# Patient Record
Sex: Female | Born: 1935
Health system: Southern US, Community
[De-identification: ages and names within clinical notes are randomized; demographics above are authoritative.]

## PROBLEM LIST (undated history)

## (undated) DIAGNOSIS — D649 Anemia, unspecified: Secondary | ICD-10-CM

## (undated) DIAGNOSIS — I10 Essential (primary) hypertension: Secondary | ICD-10-CM

## (undated) DIAGNOSIS — K219 Gastro-esophageal reflux disease without esophagitis: Secondary | ICD-10-CM

## (undated) DIAGNOSIS — M1990 Unspecified osteoarthritis, unspecified site: Secondary | ICD-10-CM

## (undated) DIAGNOSIS — Z8719 Personal history of other diseases of the digestive system: Secondary | ICD-10-CM

## (undated) DIAGNOSIS — C801 Malignant (primary) neoplasm, unspecified: Secondary | ICD-10-CM

## (undated) DIAGNOSIS — I350 Nonrheumatic aortic (valve) stenosis: Secondary | ICD-10-CM

## (undated) DIAGNOSIS — E119 Type 2 diabetes mellitus without complications: Secondary | ICD-10-CM

## (undated) DIAGNOSIS — K589 Irritable bowel syndrome without diarrhea: Secondary | ICD-10-CM

## (undated) DIAGNOSIS — Z952 Presence of prosthetic heart valve: Secondary | ICD-10-CM

## (undated) DIAGNOSIS — E039 Hypothyroidism, unspecified: Secondary | ICD-10-CM

## (undated) DIAGNOSIS — R42 Dizziness and giddiness: Secondary | ICD-10-CM

## (undated) HISTORY — DX: Nonrheumatic aortic (valve) stenosis: I35.0

## (undated) HISTORY — PX: APPENDECTOMY: SHX54

## (undated) HISTORY — PX: JOINT REPLACEMENT: SHX530

## (undated) HISTORY — PX: COLONOSCOPY: SHX174

## (undated) HISTORY — PX: SHOULDER ARTHROSCOPY: SHX128

---

## 1987-01-29 DIAGNOSIS — C801 Malignant (primary) neoplasm, unspecified: Secondary | ICD-10-CM

## 1987-01-29 HISTORY — DX: Malignant (primary) neoplasm, unspecified: C80.1

## 1993-01-28 HISTORY — PX: CHOLECYSTECTOMY: SHX55

## 1997-09-21 ENCOUNTER — Encounter: Admission: RE | Admit: 1997-09-21 | Discharge: 1997-12-20 | Payer: Self-pay | Admitting: Endocrinology

## 1998-01-04 ENCOUNTER — Other Ambulatory Visit: Admission: RE | Admit: 1998-01-04 | Discharge: 1998-01-04 | Payer: Self-pay | Admitting: Obstetrics and Gynecology

## 1998-04-03 ENCOUNTER — Ambulatory Visit (HOSPITAL_COMMUNITY): Admission: RE | Admit: 1998-04-03 | Discharge: 1998-04-03 | Payer: Self-pay | Admitting: *Deleted

## 1999-02-19 ENCOUNTER — Other Ambulatory Visit: Admission: RE | Admit: 1999-02-19 | Discharge: 1999-02-19 | Payer: Self-pay | Admitting: Obstetrics and Gynecology

## 1999-10-29 HISTORY — PX: CARPAL TUNNEL RELEASE: SHX101

## 1999-11-08 ENCOUNTER — Encounter: Payer: Self-pay | Admitting: Endocrinology

## 1999-11-08 ENCOUNTER — Encounter: Admission: RE | Admit: 1999-11-08 | Discharge: 1999-11-08 | Payer: Self-pay | Admitting: Endocrinology

## 1999-11-14 ENCOUNTER — Ambulatory Visit (HOSPITAL_BASED_OUTPATIENT_CLINIC_OR_DEPARTMENT_OTHER): Admission: RE | Admit: 1999-11-14 | Discharge: 1999-11-14 | Payer: Self-pay | Admitting: *Deleted

## 2000-02-28 ENCOUNTER — Other Ambulatory Visit: Admission: RE | Admit: 2000-02-28 | Discharge: 2000-02-28 | Payer: Self-pay | Admitting: Obstetrics and Gynecology

## 2000-10-14 ENCOUNTER — Encounter: Payer: Self-pay | Admitting: Endocrinology

## 2000-10-14 ENCOUNTER — Ambulatory Visit (HOSPITAL_COMMUNITY): Admission: RE | Admit: 2000-10-14 | Discharge: 2000-10-14 | Payer: Self-pay | Admitting: Endocrinology

## 2000-11-11 ENCOUNTER — Encounter: Admission: RE | Admit: 2000-11-11 | Discharge: 2000-11-11 | Payer: Self-pay | Admitting: Obstetrics and Gynecology

## 2000-11-11 ENCOUNTER — Encounter: Payer: Self-pay | Admitting: Obstetrics and Gynecology

## 2001-03-12 ENCOUNTER — Other Ambulatory Visit: Admission: RE | Admit: 2001-03-12 | Discharge: 2001-03-12 | Payer: Self-pay | Admitting: Obstetrics and Gynecology

## 2001-11-13 ENCOUNTER — Encounter: Payer: Self-pay | Admitting: Obstetrics and Gynecology

## 2001-11-13 ENCOUNTER — Encounter: Admission: RE | Admit: 2001-11-13 | Discharge: 2001-11-13 | Payer: Self-pay | Admitting: Obstetrics and Gynecology

## 2002-03-31 ENCOUNTER — Other Ambulatory Visit: Admission: RE | Admit: 2002-03-31 | Discharge: 2002-03-31 | Payer: Self-pay | Admitting: Obstetrics and Gynecology

## 2002-11-16 ENCOUNTER — Encounter: Admission: RE | Admit: 2002-11-16 | Discharge: 2002-11-16 | Payer: Self-pay | Admitting: Obstetrics and Gynecology

## 2002-11-16 ENCOUNTER — Encounter: Payer: Self-pay | Admitting: Obstetrics and Gynecology

## 2003-06-22 ENCOUNTER — Other Ambulatory Visit: Admission: RE | Admit: 2003-06-22 | Discharge: 2003-06-22 | Payer: Self-pay | Admitting: Obstetrics and Gynecology

## 2003-09-07 ENCOUNTER — Ambulatory Visit (HOSPITAL_COMMUNITY): Admission: RE | Admit: 2003-09-07 | Discharge: 2003-09-07 | Payer: Self-pay | Admitting: *Deleted

## 2003-11-08 ENCOUNTER — Ambulatory Visit (HOSPITAL_COMMUNITY): Admission: RE | Admit: 2003-11-08 | Discharge: 2003-11-08 | Payer: Self-pay | Admitting: Orthopedic Surgery

## 2003-11-09 ENCOUNTER — Inpatient Hospital Stay (HOSPITAL_COMMUNITY): Admission: RE | Admit: 2003-11-09 | Discharge: 2003-11-13 | Payer: Self-pay | Admitting: Orthopedic Surgery

## 2003-11-10 ENCOUNTER — Ambulatory Visit: Payer: Self-pay | Admitting: Physical Medicine & Rehabilitation

## 2003-12-15 ENCOUNTER — Encounter: Admission: RE | Admit: 2003-12-15 | Discharge: 2004-01-24 | Payer: Self-pay | Admitting: Orthopedic Surgery

## 2004-01-03 ENCOUNTER — Encounter: Admission: RE | Admit: 2004-01-03 | Discharge: 2004-01-03 | Payer: Self-pay | Admitting: Obstetrics and Gynecology

## 2004-09-11 ENCOUNTER — Encounter: Admission: RE | Admit: 2004-09-11 | Discharge: 2004-09-11 | Payer: Self-pay | Admitting: Orthopedic Surgery

## 2004-10-04 ENCOUNTER — Encounter: Admission: RE | Admit: 2004-10-04 | Discharge: 2004-10-04 | Payer: Self-pay | Admitting: Orthopedic Surgery

## 2004-10-19 ENCOUNTER — Encounter: Admission: RE | Admit: 2004-10-19 | Discharge: 2004-10-19 | Payer: Self-pay | Admitting: Orthopedic Surgery

## 2005-01-30 ENCOUNTER — Encounter: Admission: RE | Admit: 2005-01-30 | Discharge: 2005-01-30 | Payer: Self-pay | Admitting: Obstetrics and Gynecology

## 2006-01-31 ENCOUNTER — Encounter: Admission: RE | Admit: 2006-01-31 | Discharge: 2006-01-31 | Payer: Self-pay | Admitting: Endocrinology

## 2006-05-06 ENCOUNTER — Encounter: Admission: RE | Admit: 2006-05-06 | Discharge: 2006-05-06 | Payer: Self-pay | Admitting: Orthopedic Surgery

## 2006-05-20 ENCOUNTER — Encounter: Admission: RE | Admit: 2006-05-20 | Discharge: 2006-05-20 | Payer: Self-pay | Admitting: Orthopedic Surgery

## 2006-06-04 ENCOUNTER — Encounter: Admission: RE | Admit: 2006-06-04 | Discharge: 2006-06-04 | Payer: Self-pay | Admitting: Orthopedic Surgery

## 2007-01-09 ENCOUNTER — Encounter: Admission: RE | Admit: 2007-01-09 | Discharge: 2007-01-09 | Payer: Self-pay | Admitting: Orthopedic Surgery

## 2007-01-23 ENCOUNTER — Encounter: Admission: RE | Admit: 2007-01-23 | Discharge: 2007-01-23 | Payer: Self-pay | Admitting: Orthopedic Surgery

## 2007-02-06 ENCOUNTER — Encounter: Admission: RE | Admit: 2007-02-06 | Discharge: 2007-02-06 | Payer: Self-pay | Admitting: Obstetrics and Gynecology

## 2007-03-26 ENCOUNTER — Encounter: Admission: RE | Admit: 2007-03-26 | Discharge: 2007-03-26 | Payer: Self-pay | Admitting: Orthopedic Surgery

## 2008-02-08 ENCOUNTER — Encounter: Admission: RE | Admit: 2008-02-08 | Discharge: 2008-02-08 | Payer: Self-pay | Admitting: Endocrinology

## 2009-02-14 ENCOUNTER — Encounter: Admission: RE | Admit: 2009-02-14 | Discharge: 2009-02-14 | Payer: Self-pay | Admitting: Endocrinology

## 2010-02-15 ENCOUNTER — Encounter
Admission: RE | Admit: 2010-02-15 | Discharge: 2010-02-15 | Payer: Self-pay | Source: Home / Self Care | Attending: Obstetrics and Gynecology | Admitting: Obstetrics and Gynecology

## 2010-06-15 NOTE — Op Note (Signed)
Megan [name], Megan [name]                 ACCOUNT NO.:  [id number]   MEDICAL RECORD NO.:  [id number]          PATIENT TYPE:  OUT   LOCATION:  CATS                         FACILITY:  [facility]   PHYSICIAN:  Nadara Mustard, M.D.   DATE OF BIRTH:  September 02, 1934   DATE OF PROCEDURE:  11/09/2003  DATE OF DISCHARGE:                                 OPERATIVE REPORT   PREOPERATIVE DIAGNOSIS:  Osteoarthritis right knee.   POSTOPERATIVE DIAGNOSIS:  Osteoarthritis right knee.   PROCEDURE:  Right total knee arthroplasty with a #7 femur, #7 tibia, 10 mm  poly tray with a #7 patella with Osteonics Scorpio posterior stabilized  implant.   SURGEON:  Nadara Mustard, M.D.   ASSISTANT:  Vanita Panda. Magnus Ivan, M.D.   ANESTHESIA:  General plus femoral block.   ESTIMATED BLOOD LOSS:  Minimal.   ANTIBIOTICS:  Kefzol 1 g.   TOURNIQUET TIME:  60 minutes at 350 mmHg.   DISPOSITION:  To PACU in stable condition.   INDICATIONS FOR PROCEDURE:  The patient is a 76 year old woman with  osteoarthritis of her right knee.  She has failed conservative care  including anti-inflammatories, physical therapy, steroid injections,  arthroscopic debridement without relief.  She has pain with activities of  daily living and presents at this time for a total knee arthroplasty.  The  risks and benefits were discussed including infection, neurovascular injury,  persistent pain, need for additional surgery, DVT, pulmonary embolus.  The  patient states he understands and wishes to proceed at this time.   DESCRIPTION OF PROCEDURE:  The patient was brought to OR room #4 after  undergoing a femoral block.  The patient then underwent general anesthetic.  After adequate level of anesthesia was obtained, the patient's right lower  extremity was prepped using DuraPrep and draped in the sterile field and  Ioban was used to cover all exposed skin.  The knee was flexed and  tourniquet inflated to 350 mmHg.  A midline incision was made and  this was  followed by a medial parapatellar retinacular incision.  Attention was first  focused on the femur.  The guide hole was made.  The intramedullary guide  was placed in the femur for five degrees of valgus.  The 10 mm cutting block  was placed and an additional 2 mm was taken.  The attention was then focused  on the tibia.  External alignment guide was placed. This was allowing the AP  and lateral to be parallel to the shaft of the tibia.  This was set for #4  block and additional 2 mm was taken.  This was again checked with external  alignment and the tibial cut was made.  Attention was then focused on the  femur.  The femoral Chamfer guide was used and this was set for 7 pin and  the Chamfer cuts were made.  The additional posterior stabilized Chamfer  block was then placed.  This was pinned and the posterior stabilized Chamfer  block cuts were then made.  Attention was focused on the patella.  The  patella cuts were  made and this was resurfaced for a #7 patella and the  holes were made for the patella implant. The trial components were then  placed with #7 femur and #7 tibia with a 10 mm poly tray.  The knee was  placed in a full range of motion and the knee was stable.  She had full  extension, full flexion, collateral ligaments were stable.  This was then  marked for rotation.  External alignment was then again checked and the keel  punch guide was then placed and the keep punches were made for a #7 cemented  component.  The knee was then irrigated with pulse lavage, all meniscus and  loose material was removed.  The components were then cemented with the  tibia and femoral components being cemented and placed.  The patella  component was also cemented in place.  The knee was irrigated with pulse  lavage and the tibial tray was placed and the knee was left in extension and  further pulse lavaged until the cement had hardened.  After the cement had  hardened, the knee was again  placed through range of motion.  there was a  slight valgus tracking with the patella and the lateral release was  performed.  Tourniquet was deflated after one hour and hemostasis was  obtained.  The knee was then flexed and the retinacular incision was closed  using #1 Vicryl.  Subcutaneous tissue was closed using 3-0 Vicryl.  Skin was  closed using approximated staples.  The wound was covered with Adaptic  orthopedic sponges, ABD dressing, Webril and a Coban dressing.  The patient  was extubated and taken to the PACU in stable condition.       MVD/MEDQ  D:  11/09/2003  T:  11/09/2003  Job:  [id number]

## 2010-06-15 NOTE — Op Note (Signed)
NAME:  Megan [name], Megan [name]                           ACCOUNT NO.:  [id number]   MEDICAL RECORD NO.:  [id number]                   PATIENT TYPE:  AMB   LOCATION:  ENDO                                 FACILITY:  [facility]   PHYSICIAN:  Georgiana Spinner, M.D.                 DATE OF BIRTH:  1934-07-02   DATE OF PROCEDURE:  09/07/2003  DATE OF DISCHARGE:                                 OPERATIVE REPORT   PROCEDURE:  Upper endoscopy.   INDICATIONS:  GERD.   ANESTHESIA:  Demerol 60 mg, Versed 6 mg.   PROCEDURE:  With the patient mildly sedated in the left lateral decubitus  position, the Olympus videoscopic colonoscope was inserted in the mouth,  passed under direct vision through the esophagus, which appeared normal,  into the stomach.  The fundus, body, antrum, duodenal bulb, second portion  of duodenum appeared normal.  From this point the endoscope was slowly  withdrawn taking circumferential views of the duodenal mucosa until the  endoscope had been pulled back in the stomach, placed in retroflexion to  view the stomach from below.  The endoscope was then straightened and with  drawn taking circumferential views of the remaining gastric and esophageal  mucosa.  The patient's vital signs and pulse oximetry remained stable.  The  patient tolerated the procedure well without apparent complications.   FINDINGS:  Unremarkable examination.   PLAN:  Proceed to colonoscopy.                                               Georgiana Spinner, M.D.    GMO/MEDQ  D:  09/07/2003  T:  09/07/2003  Job:  [id number]

## 2010-06-15 NOTE — Op Note (Signed)
NAME:  Megan [name], Megan [name]                           ACCOUNT NO.:  [id number]   MEDICAL RECORD NO.:  [id number]                   PATIENT TYPE:  AMB   LOCATION:  ENDO                                 FACILITY:  [facility]   PHYSICIAN:  Georgiana Spinner, M.D.                 DATE OF BIRTH:  03-Apr-1934   DATE OF PROCEDURE:  09/07/2003  DATE OF DISCHARGE:                                 OPERATIVE REPORT   PROCEDURE:  Colonoscopy.   INDICATIONS:  Colon cancer screening.   ANESTHESIA:  Demerol 20, Versed 2 mg.   DESCRIPTION OF PROCEDURE:  With patient mildly sedated in the left lateral  decubitus position, the Olympus videoscopic colonoscope was inserted in the  rectum and passed under direct vision with pressure applied to the abdomen  and patient rolled to her back, we reached the cecum, identified by base of  cecum and ileocecal valve, both of which were photographed.  From this  point, the colonoscope was slowly withdrawn, taking circumferential views of  the colonic mucosa, stopping only in the rectum which appeared normal on  direct and showed hemorrhoids on retroflexed view.  The endoscope was  straightened, withdrawn.  The patient's vital signs and pulse oximeter  remained stable.  The patient tolerated the procedure well without apparent  complications.   FINDINGS:  1. Internal hemorrhoids.  2. Occasional diverticulum of sigmoid colon.  3. Otherwise, unremarkable exam.   PLAN:  Consider repeat examination in 5-10 years.                                               Georgiana Spinner, M.D.    GMO/MEDQ  D:  09/07/2003  T:  09/07/2003  Job:  [id number]

## 2010-06-15 NOTE — Op Note (Signed)
[place]  Patient:    Megan [name], Megan [name]                        MRN: [id number] Proc. Date: 11/14/99 Adm. Date:  [id number] Attending:  Kendell Bane                           Operative Report  PREOPERATIVE DIAGNOSIS:  Right carpal tunnel syndrome.  POSTOPERATIVE DIAGNOSIS:  Right carpal tunnel syndrome.  OPERATION PERFORMED:  Decompression median nerve.  SURGEON:  Lowell Bouton, M.D.  ANESTHESIA:  0.5% Marcaine local with sedation.  OPERATIVE FINDINGS:  The patient had obvious pressure on the median nerve wtih thickening of the tenosynovium around the flexor tendons.  There were no masses present in the carpal canal and the motor branch was identified and intact.  DESCRIPTION OF PROCEDURE:  Under 0.5% Marcaine local anesthesia with a tourniquet on the right arm, the right hand was prepped and draped in the usual fashion and after exsanguinating the limb, the tourniquet was inflated to 250 mmHg.  A 2 cm longitudinal incision was made in the palm just ulnar to the thenar crease in the skin creases of the hand.  Sharp dissection was carried through the subcutaneous tissues and bleeding points were coagulated. Blunt dissection was carried through the superficial palmar fascia distal to the transverse carpal ligament.  A hemostat was placed in the carpal canal up against the hook of the hamate and the transverse carpal ligament was divided sharply on the ulnar border of the median nerve.  The nerve was then dissected away from the undersurface of the ligament and the proximal end of the ligament was divided with the scissors.  The carpal canal was then palpated and was found to be adequately decompressed.  The median nerve was examined and the motor branch identified.  The carpal canal was explored and no masses were found.  The wound was irrigated with saline and the skin was closed with 4-0 nylon sutures.  Sterile  dressings were applied followed by a volar wrist splint.  The tourniquet was released with good circulation to the hand.  The patient went to the recovery room awake and stable in good condition. DD:  11/14/99 TD:  11/15/99 Job: [id number]

## 2010-06-15 NOTE — Discharge Summary (Signed)
NAMEHANNY, ELSBERRY                 ACCOUNT NO.:  [id number]   MEDICAL RECORD NO.:  [id number]          PATIENT TYPE:  INP   LOCATION:  5021                         FACILITY:  [facility]   PHYSICIAN:  Nadara Mustard, MD     DATE OF BIRTH:  April 26, 1934   DATE OF ADMISSION:  11/09/2003  DATE OF DISCHARGE:  11/13/2003                                 DISCHARGE SUMMARY   DIAGNOSIS:  Osteoarthritis right knee.   PROCEDURE:  Right total knee arthroplasty.   Discharged to home in stable condition with home health physical therapy,  DME as well as Coumadin for DVT prophylaxis.  Follow up in the office in 2  weeks.   HISTORY OF PRESENT ILLNESS:  The patient is a 76 year old woman with  osteoarthritis of the right knee who has failed conservative care, has pain  with activities of daily living, and presents at this time for right total  knee arthroplasty.   HOSPITAL COURSE:  The patient's hospital course was essentially  unremarkable.  She underwent a right total knee arthroplasty on November 09, 2003 with Osteonics Scorpio components #7 tibia, #7 patella with #7 femur  with a 10 mm poly tray.  Surgeon:  Dr. Lajoyce Corners.  Assistant:  Dr. Magnus Ivan.  The  patient received Kefzol for infection prophylaxis for 24 hours and Coumadin  for DVT prophylaxis for a total of 4 weeks.  The patient postoperatively  progressed well with physical therapy and her IV, PCA, and Foley were  discontinued on October 14th.  The patient was stable with therapy and was  discharged to home in stable condition on October 16th with follow up in the  office in 2 weeks with plan for home health physical therapy.      Vernia Buff   MVD/MEDQ  D:  12/06/2003  T:  12/06/2003  Job:  [id number]

## 2011-03-04 ENCOUNTER — Other Ambulatory Visit: Payer: Self-pay | Admitting: Obstetrics and Gynecology

## 2011-03-04 DIAGNOSIS — Z1231 Encounter for screening mammogram for malignant neoplasm of breast: Secondary | ICD-10-CM

## 2011-03-27 ENCOUNTER — Ambulatory Visit
Admission: RE | Admit: 2011-03-27 | Discharge: 2011-03-27 | Disposition: A | Discharge status: Home / Self Care | Payer: Self-pay | Source: Ambulatory Visit | Attending: Obstetrics and Gynecology | Admitting: Obstetrics and Gynecology

## 2011-03-27 DIAGNOSIS — Z1231 Encounter for screening mammogram for malignant neoplasm of breast: Secondary | ICD-10-CM

## 2012-03-17 ENCOUNTER — Other Ambulatory Visit: Payer: Self-pay | Admitting: Obstetrics and Gynecology

## 2012-03-17 DIAGNOSIS — Z1231 Encounter for screening mammogram for malignant neoplasm of breast: Secondary | ICD-10-CM

## 2012-04-20 ENCOUNTER — Ambulatory Visit
Admission: RE | Admit: 2012-04-20 | Discharge: 2012-04-20 | Disposition: A | Discharge status: Home / Self Care | Payer: Medicare Other | Source: Ambulatory Visit | Attending: Obstetrics and Gynecology | Admitting: Obstetrics and Gynecology

## 2012-04-20 DIAGNOSIS — Z1231 Encounter for screening mammogram for malignant neoplasm of breast: Secondary | ICD-10-CM

## 2013-03-26 ENCOUNTER — Other Ambulatory Visit: Payer: Self-pay

## 2013-03-26 DIAGNOSIS — Z1231 Encounter for screening mammogram for malignant neoplasm of breast: Secondary | ICD-10-CM

## 2013-04-21 ENCOUNTER — Ambulatory Visit
Admission: RE | Admit: 2013-04-21 | Discharge: 2013-04-21 | Disposition: A | Discharge status: Home / Self Care | Payer: Medicare Other | Source: Ambulatory Visit

## 2013-04-21 DIAGNOSIS — Z1231 Encounter for screening mammogram for malignant neoplasm of breast: Secondary | ICD-10-CM

## 2014-03-31 ENCOUNTER — Other Ambulatory Visit: Payer: Self-pay

## 2014-03-31 DIAGNOSIS — Z1231 Encounter for screening mammogram for malignant neoplasm of breast: Secondary | ICD-10-CM

## 2014-04-04 DIAGNOSIS — E118 Type 2 diabetes mellitus with unspecified complications: Secondary | ICD-10-CM | POA: Diagnosis not present

## 2014-04-11 DIAGNOSIS — S42292A Other displaced fracture of upper end of left humerus, initial encounter for closed fracture: Secondary | ICD-10-CM | POA: Diagnosis not present

## 2014-04-11 DIAGNOSIS — M19012 Primary osteoarthritis, left shoulder: Secondary | ICD-10-CM | POA: Diagnosis not present

## 2014-04-11 DIAGNOSIS — M25519 Pain in unspecified shoulder: Secondary | ICD-10-CM | POA: Diagnosis not present

## 2014-04-12 ENCOUNTER — Other Ambulatory Visit: Payer: Self-pay | Admitting: Orthopedic Surgery

## 2014-04-12 ENCOUNTER — Ambulatory Visit
Admission: RE | Admit: 2014-04-12 | Discharge: 2014-04-12 | Disposition: A | Discharge status: Home / Self Care | Payer: Medicare Other | Source: Ambulatory Visit | Attending: Orthopedic Surgery | Admitting: Orthopedic Surgery

## 2014-04-12 DIAGNOSIS — M25512 Pain in left shoulder: Secondary | ICD-10-CM

## 2014-04-12 DIAGNOSIS — S42212A Unspecified displaced fracture of surgical neck of left humerus, initial encounter for closed fracture: Secondary | ICD-10-CM | POA: Diagnosis not present

## 2014-04-26 ENCOUNTER — Ambulatory Visit: Payer: Self-pay

## 2014-05-09 DIAGNOSIS — S42292D Other displaced fracture of upper end of left humerus, subsequent encounter for fracture with routine healing: Secondary | ICD-10-CM | POA: Diagnosis not present

## 2014-06-07 DIAGNOSIS — Z01419 Encounter for gynecological examination (general) (routine) without abnormal findings: Secondary | ICD-10-CM | POA: Diagnosis not present

## 2014-06-29 DIAGNOSIS — S42292S Other displaced fracture of upper end of left humerus, sequela: Secondary | ICD-10-CM | POA: Diagnosis not present

## 2014-06-30 ENCOUNTER — Other Ambulatory Visit (HOSPITAL_COMMUNITY): Payer: Self-pay | Admitting: Orthopedic Surgery

## 2014-07-06 ENCOUNTER — Encounter (HOSPITAL_COMMUNITY)
Admission: RE | Admit: 2014-07-06 | Discharge: 2014-07-06 | Disposition: A | Discharge status: Home / Self Care | Payer: Medicare Other | Source: Ambulatory Visit | Attending: Orthopedic Surgery | Admitting: Orthopedic Surgery

## 2014-07-06 ENCOUNTER — Encounter (HOSPITAL_COMMUNITY): Payer: Self-pay

## 2014-07-06 DIAGNOSIS — Z01812 Encounter for preprocedural laboratory examination: Secondary | ICD-10-CM | POA: Diagnosis not present

## 2014-07-06 DIAGNOSIS — Z0183 Encounter for blood typing: Secondary | ICD-10-CM | POA: Insufficient documentation

## 2014-07-06 DIAGNOSIS — M129 Arthropathy, unspecified: Secondary | ICD-10-CM | POA: Insufficient documentation

## 2014-07-06 DIAGNOSIS — Z01818 Encounter for other preprocedural examination: Secondary | ICD-10-CM

## 2014-07-06 HISTORY — DX: Type 2 diabetes mellitus without complications: E11.9

## 2014-07-06 HISTORY — DX: Essential (primary) hypertension: I10

## 2014-07-06 HISTORY — DX: Malignant (primary) neoplasm, unspecified: C80.1

## 2014-07-06 HISTORY — DX: Unspecified osteoarthritis, unspecified site: M19.90

## 2014-07-06 HISTORY — DX: Personal history of other diseases of the digestive system: Z87.19

## 2014-07-06 LAB — CBC
HEMATOCRIT: 41.3 % (ref 36.0–46.0)
Hemoglobin: 13.9 g/dL (ref 12.0–15.0)
MCH: 29.9 pg (ref 26.0–34.0)
MCHC: 33.7 g/dL (ref 30.0–36.0)
MCV: 88.8 fL (ref 78.0–100.0)
Platelets: 305 10*3/uL (ref 150–400)
RBC: 4.65 MIL/uL (ref 3.87–5.11)
RDW: 13.5 % (ref 11.5–15.5)
WBC: 7.5 10*3/uL (ref 4.0–10.5)

## 2014-07-06 LAB — URINALYSIS, ROUTINE W REFLEX MICROSCOPIC
Bilirubin Urine: NEGATIVE
GLUCOSE, UA: NEGATIVE mg/dL
Hgb urine dipstick: NEGATIVE
Ketones, ur: NEGATIVE mg/dL
Leukocytes, UA: NEGATIVE
Nitrite: NEGATIVE
PROTEIN: NEGATIVE mg/dL
Specific Gravity, Urine: 1.02 (ref 1.005–1.030)
Urobilinogen, UA: 1 mg/dL (ref 0.0–1.0)
pH: 5.5 (ref 5.0–8.0)

## 2014-07-06 LAB — BASIC METABOLIC PANEL
Anion gap: 11 (ref 5–15)
BUN: 17 mg/dL (ref 6–20)
CHLORIDE: 105 mmol/L (ref 101–111)
CO2: 23 mmol/L (ref 22–32)
Calcium: 9.2 mg/dL (ref 8.9–10.3)
Creatinine, Ser: 0.85 mg/dL (ref 0.44–1.00)
Glucose, Bld: 174 mg/dL — ABNORMAL HIGH (ref 65–99)
Potassium: 3.8 mmol/L (ref 3.5–5.1)
SODIUM: 139 mmol/L (ref 135–145)

## 2014-07-06 LAB — TYPE AND SCREEN
ABO/RH(D): B POS
ANTIBODY SCREEN: NEGATIVE

## 2014-07-06 LAB — SURGICAL PCR SCREEN
MRSA, PCR: NEGATIVE
STAPHYLOCOCCUS AUREUS: POSITIVE — AB

## 2014-07-06 LAB — GLUCOSE, CAPILLARY: GLUCOSE-CAPILLARY: 211 mg/dL — AB (ref 65–99)

## 2014-07-06 LAB — ABO/RH: ABO/RH(D): B POS

## 2014-07-06 NOTE — Progress Notes (Signed)
   07/06/14 0912  OBSTRUCTIVE SLEEP APNEA  Have you ever been diagnosed with sleep apnea through a sleep study? No  Do you snore loudly (loud enough to be heard through closed doors)?  0  Do you often feel tired, fatigued, or sleepy during the daytime? 0  Has anyone observed you stop breathing during your sleep? 0  Do you have, or are you being treated for high blood pressure? 1  BMI more than 35 kg/m2? 1  Age over 78 years old? 1  Neck circumference greater than 40 cm/16 inches? 1 (16)  Gender: 0  Obstructive Sleep Apnea Score 4

## 2014-07-06 NOTE — Pre-Procedure Instructions (Addendum)
Megan [name]  07/06/2014      [id number] [place], [place] - [address] [place] [address] Phone: [phone] Fax: [phone]    Your procedure is scheduled on 07/14/14.  Report to [facility] short stay admitting at 530 A.M.  Call this number if you have problems the morning of surgery:  [phone]   Remember:  Do not eat food or drink liquids after midnight.  Take these medicines the morning of surgery with A SIP OF WATER myrbetriq,zantac,synthroid    STOP all herbel meds, nsaids (aleve,naproxen,advil,ibuprofen) 5 days prior to surgery starting 07/09/14 including asa, calcium,fish oil    No glimepride or nesina day of surgery   Do not wear jewelry, make-up or nail polish.  Do not wear lotions, powders, or perfumes.  You may wear deodorant.  Do not shave 48 hours prior to surgery.  Men may shave face and neck.  Do not bring valuables to the hospital.  [facility] is not responsible for any belongings or valuables.  Contacts, dentures or bridgework may not be worn into surgery.  Leave your suitcase in the car.  After surgery it may be brought to your room.  For patients admitted to the hospital, discharge time will be determined by your treatment team.  Patients discharged the day of surgery will not be allowed to drive home.   Name and phone number of your driver:    Special instructions:   Special Instructions: [place] - Preparing for Surgery  Before surgery, you can play an important role.  Because skin is not sterile, your skin needs to be as free of germs as possible.  You can reduce the number of germs on you skin by washing with CHG (chlorahexidine gluconate) soap before surgery.  CHG is an antiseptic cleaner which kills germs and bonds with the skin to continue killing germs even after washing.  Please DO NOT use if you have an allergy to CHG or antibacterial soaps.  If your skin becomes  reddened/irritated stop using the CHG and inform your nurse when you arrive at Short Stay.  Do not shave (including legs and underarms) for at least 48 hours prior to the first CHG shower.  You may shave your face.  Please follow these instructions carefully:   1.  Shower with CHG Soap the night before surgery and the morning of Surgery.  2.  If you choose to wash your hair, wash your hair first as usual with your normal shampoo.  3.  After you shampoo, rinse your hair and body thoroughly to remove the Shampoo.  4.  Use CHG as you would any other liquid soap.  You can apply chg directly  to the skin and wash gently with scrungie or a clean washcloth.  5.  Apply the CHG Soap to your body ONLY FROM THE NECK DOWN.  Do not use on open wounds or open sores.  Avoid contact with your eyes ears, mouth and genitals (private parts).  Wash genitals (private parts)       with your normal soap.  6.  Wash thoroughly, paying special attention to the area where your surgery will be performed.  7.  Thoroughly rinse your body with warm water from the neck down.  8.  DO NOT shower/wash with your normal soap after using and rinsing off the CHG Soap.  9.  Pat yourself dry with a clean towel.  10.  Wear clean pajamas.            11.  Place clean sheets on your bed the night of your first shower and do not sleep with pets.  Day of Surgery  Do not apply any lotions/deodorants the morning of surgery.  Please wear clean clothes to the hospital/surgery center.  Please read over the following fact sheets that you were given. Pain Booklet, Coughing and Deep Breathing, Blood Transfusion Information, MRSA Information and Surgical Site Infection Prevention

## 2014-07-06 NOTE — Progress Notes (Signed)
req'd ekg, cxr from dr Sharene Butters med Faxed apnea screen to dr Wilson Singer

## 2014-07-07 LAB — URINE CULTURE
COLONY COUNT: NO GROWTH
Culture: NO GROWTH

## 2014-07-11 NOTE — Progress Notes (Signed)
Spoke with Lattie Haw at Dr [facility] office and re-requested CXR EKG and OV notes.  She states she will send.

## 2014-07-13 MED ORDER — CEFAZOLIN SODIUM-DEXTROSE 2-3 GM-% IV SOLR
2.0000 g | INTRAVENOUS | Status: AC
  Administered 2014-07-14: 2 g via INTRAVENOUS
  Filled 2014-07-13: qty 50

## 2014-07-13 NOTE — H&P (Signed)
Megan [name] is an 80 y.o. female.   Chief Complaint: Left shoulder pain HPI: Megan [name] is a 80 year old female with left shoulder pain. She has a history of having rotator cuff surgery by Dr. Sharol Given several years ago. She subsequently went on to develop significant Oster arthritis in the glenohumeral joint. She was noted have rotator cuff tearing at the time. Shoulders with on more dysfunctional since that time she had a fall within the last several months in which she sustained a fracture minimally displaced to the anatomic neck. This fractures had delayed healing and is generally accelerated the demise of her left shoulder she reports pain with any activities pain with at rest pain at night and pain which interferes with her general quality of life. Denies any neck pain or numbness and tingling in the hand  Past Medical History  Diagnosis Date  . Hypertension   . Diabetes mellitus without complication   . History of hiatal hernia   . Arthritis   . Cancer     melanoma    Past Surgical History  Procedure Laterality Date  . Cholecystectomy  89  . Appendectomy      80 yrs old  . Joint replacement Right     knee   . Shoulder arthroscopy Left     No family history on file. Social History:  reports that she has never smoked. She does not have any smokeless tobacco history on file. She reports that she does not drink alcohol or use illicit drugs.  Allergies: No Known Allergies  No prescriptions prior to admission    No results found for this or any previous visit (from the past 48 hour(s)). No results found.  Review of Systems  Constitutional: Negative.   HENT: Negative.   Eyes: Negative.   Respiratory: Negative.   Cardiovascular: Negative.   Gastrointestinal: Negative.   Genitourinary: Negative.   Musculoskeletal: Positive for joint pain.  Skin: Negative.   Neurological: Negative.   Endo/Heme/Allergies: Negative.   Psychiatric/Behavioral: Negative.     There were no vitals  taken for this visit. Physical Exam  Constitutional: She is oriented to person, place, and time. She appears well-developed.  HENT:  Head: Normocephalic.  Eyes: Pupils are equal, round, and reactive to light.  Neck: Normal range of motion.  Cardiovascular: Normal rate.   Respiratory: Effort normal.  Neurological: She is alert and oriented to person, place, and time.  Skin: Skin is warm.   examination the left shoulder demonstrates palpable radial pulse external rotation 15 abduction is about 30 she has pretty poor forward flexion abduction both which are less than 45 biceps is functional deltoid does fire his a lot of coarse grinding and crepitus with the passive range of motion of the left shoulder  Assessment/Plan Impression is significant osteoarthritis and fracture of the left shoulder. Plan reverse shoulder replacement. Patient has a little bit of posterior glenoid wear on her CT scan but this I think can be accommodated 4 with reaming of the glenoid. Risk and benefits of this shoulder replacement discussed with the patient we will and to infection or vessel damage instability potential need for more surgery patient extends risk benefits and wished proceed all questions answered  Megan [name] 07/13/2014, 10:34 PM

## 2014-07-14 ENCOUNTER — Encounter (HOSPITAL_COMMUNITY): Payer: Self-pay | Admitting: *Deleted

## 2014-07-14 ENCOUNTER — Inpatient Hospital Stay (HOSPITAL_COMMUNITY): Payer: Medicare Other

## 2014-07-14 ENCOUNTER — Inpatient Hospital Stay (HOSPITAL_COMMUNITY): Payer: Medicare Other | Admitting: Certified Registered Nurse Anesthetist

## 2014-07-14 ENCOUNTER — Encounter (HOSPITAL_COMMUNITY)
Admission: RE | Disposition: A | Discharge status: Home Health | Payer: Self-pay | Source: Ambulatory Visit | Attending: Orthopedic Surgery

## 2014-07-14 ENCOUNTER — Inpatient Hospital Stay (HOSPITAL_COMMUNITY)
Admission: RE | Admit: 2014-07-14 | Discharge: 2014-07-16 | DRG: 483 | Disposition: A | Discharge status: Home Health | Payer: Medicare Other | Source: Ambulatory Visit | Attending: Orthopedic Surgery | Admitting: Orthopedic Surgery

## 2014-07-14 DIAGNOSIS — Z96612 Presence of left artificial shoulder joint: Secondary | ICD-10-CM | POA: Diagnosis not present

## 2014-07-14 DIAGNOSIS — S42212K Unspecified displaced fracture of surgical neck of left humerus, subsequent encounter for fracture with nonunion: Secondary | ICD-10-CM | POA: Diagnosis not present

## 2014-07-14 DIAGNOSIS — Z96651 Presence of right artificial knee joint: Secondary | ICD-10-CM | POA: Diagnosis present

## 2014-07-14 DIAGNOSIS — Z01818 Encounter for other preprocedural examination: Secondary | ICD-10-CM

## 2014-07-14 DIAGNOSIS — E119 Type 2 diabetes mellitus without complications: Secondary | ICD-10-CM | POA: Diagnosis not present

## 2014-07-14 DIAGNOSIS — Z85820 Personal history of malignant melanoma of skin: Secondary | ICD-10-CM

## 2014-07-14 DIAGNOSIS — M19012 Primary osteoarthritis, left shoulder: Secondary | ICD-10-CM | POA: Diagnosis not present

## 2014-07-14 DIAGNOSIS — S4292XK Fracture of left shoulder girdle, part unspecified, subsequent encounter for fracture with nonunion: Secondary | ICD-10-CM | POA: Diagnosis not present

## 2014-07-14 DIAGNOSIS — M25512 Pain in left shoulder: Secondary | ICD-10-CM | POA: Diagnosis present

## 2014-07-14 DIAGNOSIS — I10 Essential (primary) hypertension: Secondary | ICD-10-CM | POA: Diagnosis present

## 2014-07-14 DIAGNOSIS — Z471 Aftercare following joint replacement surgery: Secondary | ICD-10-CM | POA: Diagnosis not present

## 2014-07-14 DIAGNOSIS — S42292K Other displaced fracture of upper end of left humerus, subsequent encounter for fracture with nonunion: Secondary | ICD-10-CM | POA: Diagnosis not present

## 2014-07-14 DIAGNOSIS — G8918 Other acute postprocedural pain: Secondary | ICD-10-CM | POA: Diagnosis not present

## 2014-07-14 HISTORY — PX: REVERSE SHOULDER ARTHROPLASTY: SHX5054

## 2014-07-14 LAB — GLUCOSE, CAPILLARY
Glucose-Capillary: 149 mg/dL — ABNORMAL HIGH (ref 65–99)
Glucose-Capillary: 155 mg/dL — ABNORMAL HIGH (ref 65–99)
Glucose-Capillary: 176 mg/dL — ABNORMAL HIGH (ref 65–99)

## 2014-07-14 SURGERY — ARTHROPLASTY, SHOULDER, TOTAL, REVERSE
Anesthesia: Regional | Site: Shoulder | Laterality: Left

## 2014-07-14 MED ORDER — CELECOXIB 200 MG PO CAPS
200.0000 mg | ORAL_CAPSULE | Freq: Two times a day (BID) | ORAL | Status: DC
  Administered 2014-07-14 – 2014-07-16 (×4): 200 mg via ORAL
  Filled 2014-07-14 (×4): qty 1

## 2014-07-14 MED ORDER — LEVOTHYROXINE SODIUM 125 MCG PO TABS
125.0000 ug | ORAL_TABLET | Freq: Every day | ORAL | Status: DC
  Administered 2014-07-15 – 2014-07-16 (×2): 125 ug via ORAL
  Filled 2014-07-14 (×2): qty 1

## 2014-07-14 MED ORDER — FENTANYL CITRATE (PF) 100 MCG/2ML IJ SOLN
INTRAMUSCULAR | Status: DC | PRN
  Administered 2014-07-14 (×3): 50 ug via INTRAVENOUS
  Administered 2014-07-14 (×2): 25 ug via INTRAVENOUS

## 2014-07-14 MED ORDER — METOCLOPRAMIDE HCL 5 MG/ML IJ SOLN: 5.0000 mg | Freq: Three times a day (TID) | INTRAMUSCULAR | Status: DC | PRN

## 2014-07-14 MED ORDER — ALBUMIN HUMAN 5 % IV SOLN: INTRAVENOUS | Status: DC | PRN

## 2014-07-14 MED ORDER — ONDANSETRON HCL 4 MG/2ML IJ SOLN
INTRAMUSCULAR | Status: AC
  Filled 2014-07-14: qty 2

## 2014-07-14 MED ORDER — CALCIUM CARBONATE 1250 (500 CA) MG PO TABS
1.0000 | ORAL_TABLET | Freq: Every day | ORAL | Status: DC
  Administered 2014-07-15 – 2014-07-16 (×2): 500 mg via ORAL
  Filled 2014-07-14 (×2): qty 1

## 2014-07-14 MED ORDER — SUCCINYLCHOLINE CHLORIDE 20 MG/ML IJ SOLN
INTRAMUSCULAR | Status: AC
  Filled 2014-07-14: qty 1

## 2014-07-14 MED ORDER — ADULT MULTIVITAMIN W/MINERALS CH
1.0000 | ORAL_TABLET | Freq: Every day | ORAL | Status: DC
  Administered 2014-07-15 – 2014-07-16 (×2): 1 via ORAL
  Filled 2014-07-14 (×2): qty 1

## 2014-07-14 MED ORDER — ONDANSETRON HCL 4 MG/2ML IJ SOLN
4.0000 mg | Freq: Four times a day (QID) | INTRAMUSCULAR | Status: DC | PRN
  Administered 2014-07-14: 4 mg via INTRAVENOUS

## 2014-07-14 MED ORDER — OXYCODONE HCL 5 MG PO TABS
5.0000 mg | ORAL_TABLET | ORAL | Status: DC | PRN
Start: 2014-07-14 — End: 2014-07-16
  Administered 2014-07-14 – 2014-07-15 (×7): 10 mg via ORAL
  Filled 2014-07-14 (×7): qty 2

## 2014-07-14 MED ORDER — PHENYLEPHRINE 40 MCG/ML (10ML) SYRINGE FOR IV PUSH (FOR BLOOD PRESSURE SUPPORT)
PREFILLED_SYRINGE | INTRAVENOUS | Status: AC
  Filled 2014-07-14: qty 20

## 2014-07-14 MED ORDER — CHLORHEXIDINE GLUCONATE 4 % EX LIQD: 60.0000 mL | Freq: Once | CUTANEOUS | Status: DC

## 2014-07-14 MED ORDER — ACETAMINOPHEN 325 MG PO TABS: 650.0000 mg | ORAL_TABLET | Freq: Four times a day (QID) | ORAL | Status: DC | PRN

## 2014-07-14 MED ORDER — AMLODIPINE BESYLATE-VALSARTAN 5-160 MG PO TABS: 1.0000 | ORAL_TABLET | Freq: Every day | ORAL | Status: DC

## 2014-07-14 MED ORDER — IRBESARTAN 150 MG PO TABS
150.0000 mg | ORAL_TABLET | Freq: Every day | ORAL | Status: DC
  Administered 2014-07-14 – 2014-07-16 (×3): 150 mg via ORAL
  Filled 2014-07-14 (×3): qty 1

## 2014-07-14 MED ORDER — ASPIRIN EC 325 MG PO TBEC
325.0000 mg | DELAYED_RELEASE_TABLET | Freq: Every day | ORAL | Status: DC
  Administered 2014-07-15 – 2014-07-16 (×2): 325 mg via ORAL
  Filled 2014-07-14 (×3): qty 1

## 2014-07-14 MED ORDER — TRIAMTERENE-HCTZ 37.5-25 MG PO CAPS
1.0000 | ORAL_CAPSULE | Freq: Every day | ORAL | Status: DC | PRN
  Filled 2014-07-14: qty 1

## 2014-07-14 MED ORDER — GLIMEPIRIDE 4 MG PO TABS
2.0000 mg | ORAL_TABLET | Freq: Every day | ORAL | Status: DC
  Administered 2014-07-15 – 2014-07-16 (×2): 2 mg via ORAL
  Filled 2014-07-14 (×2): qty 1

## 2014-07-14 MED ORDER — ONDANSETRON HCL 4 MG PO TABS: 4.0000 mg | ORAL_TABLET | Freq: Four times a day (QID) | ORAL | Status: DC | PRN

## 2014-07-14 MED ORDER — METOCLOPRAMIDE HCL 5 MG PO TABS: 5.0000 mg | ORAL_TABLET | Freq: Three times a day (TID) | ORAL | Status: DC | PRN

## 2014-07-14 MED ORDER — MORPHINE SULFATE 2 MG/ML IJ SOLN
2.0000 mg | INTRAMUSCULAR | Status: DC | PRN
  Administered 2014-07-14: 2 mg via INTRAVENOUS
  Filled 2014-07-14: qty 1

## 2014-07-14 MED ORDER — SUCCINYLCHOLINE CHLORIDE 20 MG/ML IJ SOLN
INTRAMUSCULAR | Status: DC | PRN
  Administered 2014-07-14: 100 mg via INTRAVENOUS

## 2014-07-14 MED ORDER — AMLODIPINE BESYLATE 5 MG PO TABS
5.0000 mg | ORAL_TABLET | Freq: Every day | ORAL | Status: DC
  Administered 2014-07-14 – 2014-07-16 (×3): 5 mg via ORAL
  Filled 2014-07-14 (×3): qty 1

## 2014-07-14 MED ORDER — MIRABEGRON ER 50 MG PO TB24
50.0000 mg | ORAL_TABLET | Freq: Every day | ORAL | Status: DC
  Administered 2014-07-15 – 2014-07-16 (×2): 50 mg via ORAL
  Filled 2014-07-14 (×2): qty 1

## 2014-07-14 MED ORDER — FENTANYL CITRATE (PF) 250 MCG/5ML IJ SOLN
INTRAMUSCULAR | Status: AC
  Filled 2014-07-14: qty 5

## 2014-07-14 MED ORDER — POTASSIUM CHLORIDE IN NACL 20-0.9 MEQ/L-% IV SOLN
INTRAVENOUS | Status: AC
  Administered 2014-07-14 – 2014-07-15 (×2): via INTRAVENOUS
  Filled 2014-07-14 (×2): qty 1000

## 2014-07-14 MED ORDER — ACETAMINOPHEN 650 MG RE SUPP: 650.0000 mg | Freq: Four times a day (QID) | RECTAL | Status: DC | PRN

## 2014-07-14 MED ORDER — ONE-DAILY MULTI VITAMINS PO TABS: 1.0000 | ORAL_TABLET | Freq: Every day | ORAL | Status: DC

## 2014-07-14 MED ORDER — PHENYLEPHRINE HCL 10 MG/ML IJ SOLN
INTRAMUSCULAR | Status: DC | PRN
  Administered 2014-07-14 (×4): 40 ug via INTRAVENOUS
  Administered 2014-07-14: 80 ug via INTRAVENOUS
  Administered 2014-07-14 (×2): 40 ug via INTRAVENOUS
  Administered 2014-07-14: 80 ug via INTRAVENOUS

## 2014-07-14 MED ORDER — ONDANSETRON HCL 4 MG/2ML IJ SOLN
INTRAMUSCULAR | Status: DC | PRN
  Administered 2014-07-14: 4 mg via INTRAVENOUS

## 2014-07-14 MED ORDER — DOCUSATE SODIUM 100 MG PO CAPS
100.0000 mg | ORAL_CAPSULE | Freq: Two times a day (BID) | ORAL | Status: DC
  Administered 2014-07-14 – 2014-07-16 (×5): 100 mg via ORAL
  Filled 2014-07-14 (×5): qty 1

## 2014-07-14 MED ORDER — ROCURONIUM BROMIDE 50 MG/5ML IV SOLN
INTRAVENOUS | Status: AC
  Filled 2014-07-14: qty 1

## 2014-07-14 MED ORDER — ROCURONIUM BROMIDE 100 MG/10ML IV SOLN
INTRAVENOUS | Status: DC | PRN
  Administered 2014-07-14 (×3): 20 mg via INTRAVENOUS
  Administered 2014-07-14: 30 mg via INTRAVENOUS

## 2014-07-14 MED ORDER — PROPOFOL 10 MG/ML IV BOLUS
INTRAVENOUS | Status: AC
  Filled 2014-07-14: qty 20

## 2014-07-14 MED ORDER — PROPOFOL 10 MG/ML IV BOLUS
INTRAVENOUS | Status: DC | PRN
  Administered 2014-07-14: 160 mg via INTRAVENOUS
  Administered 2014-07-14: 20 mg via INTRAVENOUS

## 2014-07-14 MED ORDER — CHOLECALCIFEROL 10 MCG (400 UNIT) PO TABS
400.0000 [IU] | ORAL_TABLET | Freq: Every day | ORAL | Status: DC
  Administered 2014-07-15 – 2014-07-16 (×2): 400 [IU] via ORAL
  Filled 2014-07-14 (×2): qty 1

## 2014-07-14 MED ORDER — CEFAZOLIN SODIUM-DEXTROSE 2-3 GM-% IV SOLR
2.0000 g | Freq: Four times a day (QID) | INTRAVENOUS | Status: AC
Start: 2014-07-14 — End: 2014-07-14
  Administered 2014-07-14 (×2): 2 g via INTRAVENOUS
  Filled 2014-07-14 (×2): qty 50

## 2014-07-14 MED ORDER — GLYCOPYRROLATE 0.2 MG/ML IJ SOLN
INTRAMUSCULAR | Status: AC
  Filled 2014-07-14: qty 3

## 2014-07-14 MED ORDER — LACTATED RINGERS IV SOLN
INTRAVENOUS | Status: DC | PRN
  Administered 2014-07-14 (×3): via INTRAVENOUS

## 2014-07-14 MED ORDER — PHENYLEPHRINE HCL 10 MG/ML IJ SOLN
10.0000 mg | INTRAMUSCULAR | Status: DC | PRN
  Administered 2014-07-14: 10 ug/min via INTRAVENOUS

## 2014-07-14 MED ORDER — KETOTIFEN FUMARATE 0.025 % OP SOLN
1.0000 [drp] | Freq: Every day | OPHTHALMIC | Status: DC
  Administered 2014-07-15 – 2014-07-16 (×2): 1 [drp] via OPHTHALMIC
  Filled 2014-07-14: qty 5

## 2014-07-14 MED ORDER — PHENOL 1.4 % MT LIQD: 1.0000 | OROMUCOSAL | Status: DC | PRN

## 2014-07-14 MED ORDER — HYDROMORPHONE HCL 1 MG/ML IJ SOLN: 0.2500 mg | INTRAMUSCULAR | Status: DC | PRN

## 2014-07-14 MED ORDER — LINAGLIPTIN 5 MG PO TABS
5.0000 mg | ORAL_TABLET | Freq: Every day | ORAL | Status: DC
  Administered 2014-07-15 – 2014-07-16 (×2): 5 mg via ORAL
  Filled 2014-07-14 (×3): qty 1

## 2014-07-14 MED ORDER — SODIUM CHLORIDE 0.9 % IR SOLN
Status: DC | PRN
  Administered 2014-07-14: 1000 mL
  Administered 2014-07-14: 7000 mL
  Administered 2014-07-14: 2000 mL

## 2014-07-14 MED ORDER — ALBUMIN HUMAN 5 % IV SOLN
INTRAVENOUS | Status: DC | PRN
  Administered 2014-07-14: 11:00:00 via INTRAVENOUS

## 2014-07-14 MED ORDER — CHOLECALCIFEROL 10 MCG (400 UNIT) PO TABS: 400.0000 [IU] | ORAL_TABLET | Freq: Every day | ORAL | Status: DC

## 2014-07-14 MED ORDER — LIDOCAINE HCL (CARDIAC) 20 MG/ML IV SOLN
INTRAVENOUS | Status: DC | PRN
  Administered 2014-07-14: 60 mg via INTRAVENOUS

## 2014-07-14 MED ORDER — ALOGLIPTIN BENZOATE 25 MG PO TABS: 1.0000 | ORAL_TABLET | Freq: Every day | ORAL | Status: DC

## 2014-07-14 MED ORDER — NEOSTIGMINE METHYLSULFATE 10 MG/10ML IV SOLN
INTRAVENOUS | Status: AC
  Filled 2014-07-14: qty 1

## 2014-07-14 MED ORDER — GLYCOPYRROLATE 0.2 MG/ML IJ SOLN
INTRAMUSCULAR | Status: DC | PRN
  Administered 2014-07-14: 0.6 mg via INTRAVENOUS

## 2014-07-14 MED ORDER — MENTHOL 3 MG MT LOZG: 1.0000 | LOZENGE | OROMUCOSAL | Status: DC | PRN

## 2014-07-14 MED ORDER — PROMETHAZINE HCL 25 MG/ML IJ SOLN: 6.2500 mg | INTRAMUSCULAR | Status: DC | PRN

## 2014-07-14 MED ORDER — NEOSTIGMINE METHYLSULFATE 10 MG/10ML IV SOLN
INTRAVENOUS | Status: DC | PRN
  Administered 2014-07-14: 5 mg via INTRAVENOUS

## 2014-07-14 MED ORDER — FAMOTIDINE 20 MG PO TABS
10.0000 mg | ORAL_TABLET | Freq: Every day | ORAL | Status: DC
  Administered 2014-07-15 – 2014-07-16 (×2): 10 mg via ORAL
  Filled 2014-07-14 (×3): qty 1

## 2014-07-14 SURGICAL SUPPLY — 69 items
BASEPLATE GLENOID SHLDR SM (Shoulder) ×1 IMPLANT
BLADE SAW SGTL 13X75X1.27 (BLADE) ×2 IMPLANT
BSPLAT GLND SM PRFT SHLDR CA (Shoulder) ×1 IMPLANT
BUR MATCHSTICK NEURO 3.0 LAGG (BURR) ×2 IMPLANT
CEMENT HV SMART SET (Cement) ×1 IMPLANT
CLSR STERI-STRIP ANTIMIC 1/2X4 (GAUZE/BANDAGES/DRESSINGS) ×1 IMPLANT
COVER SURGICAL LIGHT HANDLE (MISCELLANEOUS) ×2 IMPLANT
COVER TABLE BACK 60X90 (DRAPES) IMPLANT
DRAPE C-ARM 42X72 X-RAY (DRAPES) IMPLANT
DRAPE IMP U-DRAPE 54X76 (DRAPES) IMPLANT
DRAPE INCISE IOBAN 66X45 STRL (DRAPES) ×4 IMPLANT
DRAPE U-SHAPE 47X51 STRL (DRAPES) ×4 IMPLANT
DRSG AQUACEL AG ADV 3.5X10 (GAUZE/BANDAGES/DRESSINGS) ×2 IMPLANT
DURAPREP 26ML APPLICATOR (WOUND CARE) ×2 IMPLANT
ELECT BLADE 6.5 EXT (BLADE) IMPLANT
ELECT CAUTERY BLADE 6.4 (BLADE) ×1 IMPLANT
ELECT REM PT RETURN 9FT ADLT (ELECTROSURGICAL) ×2
ELECTRODE REM PT RTRN 9FT ADLT (ELECTROSURGICAL) ×1 IMPLANT
GLENOSPHERE LATERAL 36MM+4 (Shoulder) ×1 IMPLANT
GLOVE BIOGEL PI IND STRL 7.5 (GLOVE) ×1 IMPLANT
GLOVE BIOGEL PI IND STRL 8 (GLOVE) ×1 IMPLANT
GLOVE BIOGEL PI INDICATOR 7.5 (GLOVE) ×1
GLOVE BIOGEL PI INDICATOR 8 (GLOVE) ×1
GLOVE ECLIPSE 7.0 STRL STRAW (GLOVE) ×2 IMPLANT
GLOVE SURG ORTHO 8.0 STRL STRW (GLOVE) ×2 IMPLANT
GOWN STRL REUS W/ TWL LRG LVL3 (GOWN DISPOSABLE) ×2 IMPLANT
GOWN STRL REUS W/ TWL XL LVL3 (GOWN DISPOSABLE) ×1 IMPLANT
GOWN STRL REUS W/TWL LRG LVL3 (GOWN DISPOSABLE) ×4
GOWN STRL REUS W/TWL XL LVL3 (GOWN DISPOSABLE) ×2
INSERT HUMERAL 36 +6 (Shoulder) ×1 IMPLANT
KIT BASIN OR (CUSTOM PROCEDURE TRAY) ×2 IMPLANT
KIT BEACH CHAIR TRIMANO (Kit) ×1 IMPLANT
KIT ROOM TURNOVER OR (KITS) ×2 IMPLANT
LOOP VESSEL MAXI BLUE (MISCELLANEOUS) ×1 IMPLANT
MANIFOLD NEPTUNE II (INSTRUMENTS) ×2 IMPLANT
NDL HYPO 25GX1X1/2 BEV (NEEDLE) IMPLANT
NDL SUT 6 .5 CRC .975X.05 MAYO (NEEDLE) ×1 IMPLANT
NEEDLE HYPO 25GX1X1/2 BEV (NEEDLE) IMPLANT
NEEDLE MAYO TAPER (NEEDLE) ×2
NS IRRIG 1000ML POUR BTL (IV SOLUTION) ×12 IMPLANT
PACK SHOULDER (CUSTOM PROCEDURE TRAY) ×2 IMPLANT
PAD ARMBOARD 7.5X6 YLW CONV (MISCELLANEOUS) ×4 IMPLANT
PASSER SUT SWANSON 36MM LOOP (INSTRUMENTS) ×1 IMPLANT
RESTRICTOR CEMENT PE SZ 2 (Cement) ×1 IMPLANT
SCREW LOCK PERIPHERAL 30MM (Shoulder) ×2 IMPLANT
SCREW NON LOCK 6.5X15 (Shoulder) ×1 IMPLANT
SET PIN UNIVERSAL REVERSE (SET/KITS/TRAYS/PACK) ×1 IMPLANT
SLEEVE SURGEON STRL (DRAPES) ×1 IMPLANT
SLING ARM IMMOBILIZER LRG (SOFTGOODS) ×1 IMPLANT
SPONGE LAP 18X18 X RAY DECT (DISPOSABLE) ×2 IMPLANT
STEM REVERSE SIZE 5-135 (Shoulder) ×1 IMPLANT
SUCTION FRAZIER TIP 10 FR DISP (SUCTIONS) ×2 IMPLANT
SUT FIBERWIRE #2 38 T-5 BLUE (SUTURE) ×6
SUT MAXBRAID (SUTURE) IMPLANT
SUT MNCRL AB 3-0 PS2 18 (SUTURE) ×2 IMPLANT
SUT VIC AB 0 CTB1 27 (SUTURE) ×4 IMPLANT
SUT VIC AB 1 CT1 27 (SUTURE) ×2
SUT VIC AB 1 CT1 27XBRD ANBCTR (SUTURE) ×1 IMPLANT
SUT VIC AB 2-0 CT1 27 (SUTURE) ×4
SUT VIC AB 2-0 CT1 TAPERPNT 27 (SUTURE) ×3 IMPLANT
SUT VICRYL 0 TIES 12 18 (SUTURE) ×1 IMPLANT
SUTURE FIBERWR #2 38 T-5 BLUE (SUTURE) ×1 IMPLANT
SYR CONTROL 10ML LL (SYRINGE) IMPLANT
TOWEL OR 17X24 6PK STRL BLUE (TOWEL DISPOSABLE) ×2 IMPLANT
TOWEL OR 17X26 10 PK STRL BLUE (TOWEL DISPOSABLE) ×2 IMPLANT
TOWER CARTRIDGE SMART MIX (DISPOSABLE) ×1 IMPLANT
TRAY FOLEY BAG SILVER LF 16FR (CATHETERS) ×1 IMPLANT
TRIMANO KIT ×1 IMPLANT
WATER STERILE IRR 1000ML POUR (IV SOLUTION) ×1 IMPLANT

## 2014-07-14 NOTE — Interval H&P Note (Signed)
History and Physical Interval Note:  07/14/2014 7:25 AM  Megan [name]  has presented today for surgery, with the diagnosis of LEFT SHOULDER ROTATOR CUFF ARTHOPATHY, ARTHIRITIS, FRACTURE  The various methods of treatment have been discussed with the patient and family. After consideration of risks, benefits and other options for treatment, the patient has consented to  Procedure(s): LEFT REVERSE SHOULDER ARTHROPLASTY (Left) as a surgical intervention .  The patient's history has been reviewed, patient examined, no change in status, stable for surgery.  I have reviewed the patient's chart and labs.  Questions were answered to the patient's satisfaction.     Ava Tangney SCOTT

## 2014-07-14 NOTE — Anesthesia Postprocedure Evaluation (Signed)
  Anesthesia Post-op Note  [name]  Procedure(s) Performed: Procedure(s): LEFT REVERSE SHOULDER ARTHROPLASTY (Left)  Patient Location: PACU  Anesthesia Type:GA combined with regional for post-op pain  Level of Consciousness: awake  Airway and Oxygen Therapy: Patient Spontanous Breathing  Post-op Pain: mild  Post-op Assessment: Post-op Vital signs reviewed              Post-op Vital Signs: Reviewed  Last Vitals:  Filed Vitals:   07/14/14 1424  BP: 170/59  Pulse: 87  Temp: 36.9 C  Resp: 16    Complications: No apparent anesthesia complications

## 2014-07-14 NOTE — Anesthesia Preprocedure Evaluation (Addendum)
Anesthesia Evaluation  Patient identified by MRN, date of birth, ID band Patient awake    Reviewed: Allergy & Precautions, NPO status   Airway Mallampati: II  TM Distance: >3 FB Neck ROM: Full    Dental   Pulmonary neg pulmonary ROS,  breath sounds clear to auscultation        Cardiovascular hypertension, Rhythm:Regular Rate:Normal     Neuro/Psych    GI/Hepatic Neg liver ROS, hiatal hernia,   Endo/Other  diabetes  Renal/GU negative Renal ROS     Musculoskeletal   Abdominal   Peds  Hematology   Anesthesia Other Findings   Reproductive/Obstetrics                          Anesthesia Physical Anesthesia Plan  ASA: II  Anesthesia Plan: General and Regional   Post-op Pain Management:    Induction:   Airway Management Planned: Oral ETT  Additional Equipment:   Intra-op Plan:   Post-operative Plan:   Informed Consent: I have reviewed the patients History and Physical, chart, labs and discussed the procedure including the risks, benefits and alternatives for the proposed anesthesia with the patient or authorized representative who has indicated his/her understanding and acceptance.   Dental advisory given  Plan Discussed with:   Anesthesia Plan Comments:         Anesthesia Quick Evaluation

## 2014-07-14 NOTE — Anesthesia Procedure Notes (Addendum)
Procedure Name: Intubation Date/Time: 07/14/2014 7:40 AM Performed by: Vennie Homans Pre-anesthesia Checklist: Patient identified, Timeout performed, Emergency Drugs available, Suction available and Patient being monitored Patient Re-evaluated:Patient Re-evaluated prior to inductionOxygen Delivery Method: Circle system utilized Preoxygenation: Pre-oxygenation with 100% oxygen Intubation Type: IV induction Ventilation: Mask ventilation without difficulty Laryngoscope Size: Mac and 3 Grade View: Grade I Tube type: Oral Tube size: 7.0 mm Number of attempts: 1 Airway Equipment and Method: Stylet Placement Confirmation: ETT inserted through vocal cords under direct vision,  breath sounds checked- equal and bilateral and positive ETCO2 Secured at: 21 cm Tube secured with: Tape Dental Injury: Teeth and Oropharynx as per pre-operative assessment     Anesthesia Regional Block:  Interscalene brachial plexus block  Pre-Anesthetic Checklist: ,, timeout performed, Correct Patient, Correct Site, Correct Laterality, Correct Procedure, Correct Position, site marked, Risks and benefits discussed,  Surgical consent,  Pre-op evaluation,  At surgeon's request and post-op pain management  Laterality: Left  Prep: chloraprep       Needles:   Needle Type: Stimulator Needle - 40          Additional Needles:  Procedures: Doppler guided, ultrasound guided (picture in chart) and nerve stimulator Interscalene brachial plexus block  Nerve Stimulator or Paresthesia:  Response: 0.5 mA,   Additional Responses:   Narrative:  Start time: 07/14/2014 7:10 AM End time: 07/14/2014 7:25 AM Injection made incrementally with aspirations every 5 mL.  Performed by: Personally

## 2014-07-14 NOTE — Brief Op Note (Signed)
07/14/2014  12:17 PM  PATIENT:  Megan [name]  80 y.o. female  PRE-OPERATIVE DIAGNOSIS:  LEFT SHOULDER ROTATOR CUFF ARTHOPATHY, ARTHIRITIS, FRACTURE  POST-OPERATIVE DIAGNOSIS:  LEFT SHOULDER ROTATOR CUFF ARTHOPATHY, nonunion surgical neck fracture  PROCEDURE:  Procedure(s): LEFT REVERSE SHOULDER ARTHROPLASTY  SURGEON:  Surgeon(s): Meredith Pel, MD  ASSISTANT: carla bethune rnfa  ANESTHESIA:   general  EBL: 250 ml    Total I/O In: 2000 [I.V.:2000] Out: 300 [Urine:100; Blood:200]  BLOOD ADMINISTERED: none  DRAINS: none   LOCAL MEDICATIONS USED:  none  SPECIMEN:  No Specimen  COUNTS:  YES  TOURNIQUET:  * No tourniquets in log *  DICTATION: .Other Dictation: Dictation Number [id number]  PLAN OF CARE: Admit to inpatient   PATIENT DISPOSITION:  PACU - hemodynamically stable

## 2014-07-14 NOTE — Transfer of Care (Signed)
Immediate Anesthesia Transfer of Care Note  [name]  Procedure(s) Performed: Procedure(s): LEFT REVERSE SHOULDER ARTHROPLASTY (Left)  Patient Location: PACU  Anesthesia Type:General  Level of Consciousness: awake, alert , patient cooperative, confused and responds to stimulation  Airway & Oxygen Therapy: Patient Spontanous Breathing and Patient connected to nasal cannula oxygen  Post-op Assessment: Report given to RN, Post -op Vital signs reviewed and stable, Patient moving all extremities X 4 and Patient able to stick tongue midline  Post vital signs: stable  Last Vitals:  Filed Vitals:   07/14/14 0549  BP: 155/62  Pulse: 75  Temp: 36.6 C  Resp: 18    Complications: No apparent anesthesia complications

## 2014-07-14 NOTE — Plan of Care (Signed)
Problem: Consults Goal: Diagnosis - Shoulder Surgery Reverse Total Shoulder Arthroplasty Left

## 2014-07-15 ENCOUNTER — Encounter (HOSPITAL_COMMUNITY): Payer: Self-pay | Admitting: Orthopedic Surgery

## 2014-07-15 LAB — GLUCOSE, CAPILLARY
GLUCOSE-CAPILLARY: 158 mg/dL — AB (ref 65–99)
Glucose-Capillary: 188 mg/dL — ABNORMAL HIGH (ref 65–99)

## 2014-07-15 MED ORDER — OXYCODONE HCL 5 MG PO TABS: 5.0000 mg | ORAL_TABLET | ORAL | Status: DC | PRN

## 2014-07-15 MED ORDER — ASPIRIN 325 MG PO TBEC: 325.0000 mg | DELAYED_RELEASE_TABLET | Freq: Every day | ORAL | Status: DC

## 2014-07-15 NOTE — Care Management (Signed)
Utilization review completed by Kashius Dominic N. Kaleb Sek, RN BSN 

## 2014-07-15 NOTE — Progress Notes (Signed)
Subjective: Pt stable - pain ok   Objective: Vital signs in last 24 hours: Temp:  [98.5 F (36.9 C)-99.3 F (37.4 C)] 99.3 F (37.4 C) (06/17 0521) Pulse Rate:  [77-101] 101 (06/17 0521) Resp:  [13-34] 16 (06/17 0521) BP: (134-170)/(53-69) 137/53 mmHg (06/17 0521) SpO2:  [91 %-95 %] 91 % (06/17 0521)  Intake/Output from previous day: 06/16 0701 - 06/17 0700 In: 5210 [P.O.:120; I.V.:5090] Out: 0931 [Urine:1125; Blood:525] Intake/Output this shift: Total I/O In: -  Out: 900 [Urine:900]  Exam:  Intact pulses distally No cellulitis present Compartment soft  Labs: No results for input(s): HGB in the last 72 hours. No results for input(s): WBC, RBC, HCT, PLT in the last 72 hours. No results for input(s): NA, K, CL, CO2, BUN, CREATININE, GLUCOSE, CALCIUM in the last 72 hours. No results for input(s): LABPT, INR in the last 72 hours.  Assessment/Plan: Plan PT OT today - dc am   DEAN,GREGORY SCOTT 07/15/2014, 7:21 AM

## 2014-07-15 NOTE — Care Management Note (Signed)
Case Management Note  Patient Details  Name: Megan [name] MRN: [id number] Date of Birth: 07/27/1934  Subjective/Objective:        S/p left shoulder reverse shoulder replacement            Action/Plan: Set up with [facility] for therapy by MD office. Occupational therapy recommended HHOT. Spoke with patient, agreeable to HHPT and HHOT with [name].  Contacted [name] at [facility] and verified that patient is set up with [facility]. Spoke with [name] at [facility] [facility], they are providing shoulder CPM. No other equipment needs identified.Patient states that she will have family available to assist after discharge.    Expected Discharge Date:                  Expected Discharge Plan:  [place]  In-House Referral:  NA  Discharge planning Services  CM Consult  Post Acute Care Choice:  Home Health, Durable Medical Equipment Choice offered to:  Patient  DME Arranged:  CPM DME Agency:  [facility]  HH Arranged:  PT, OT HH Agency:  [facility]  Status of Service:  Completed, signed off  Medicare Important Message Given:  N/A - LOS <3 / Initial given by admissions Date Medicare IM Given:    Medicare IM give by:    Date Additional Medicare IM Given:    Additional Medicare Important Message give by:     If discussed at [place] of Stay Meetings, dates discussed:    Additional Comments:  [name], RN 07/15/2014, 1:59 PM

## 2014-07-15 NOTE — Op Note (Signed)
Megan, [name] NO.:  [id number]  MEDICAL RECORD NO.:  [id number]  LOCATION:  5N21C                        FACILITY:  [facility]  PHYSICIAN:  Anderson Malta, M.D.    DATE OF BIRTH:  November 05, 1934  DATE OF PROCEDURE: DATE OF DISCHARGE:                              OPERATIVE REPORT   PREOPERATIVE DIAGNOSIS:  Left shoulder nonunion surgical neck fracture and severe glenohumeral arthritis.  POSTOPERATIVE DIAGNOSIS:  Left shoulder nonunion surgical neck fracture and severe glenohumeral arthritis.  PROCEDURE:  Left shoulder reverse shoulder replacement.  SURGEON:  Anderson Malta, M.D.  ASSISTANT:  Laure Kidney, RNFA.  INDICATIONS:  Megan [name] is a 80 year old patient with significant left shoulder arthritis and pain, presents for operative management after explanation of risks and benefits of her significant shoulder pathology.  PROCEDURE IN DETAIL:  The patient was brought to the operating room where general endotracheal anesthesia was induced.  Preoperative antibiotics were administered.  Time-out was called.  The patient was placed in the beach-chair position with the head in neutral position. She was then in the left arm shoulder hand, prescrubbed with alcohol and Betadine, allowed to air dry, prepped with DuraPrep solution and draped in a sterile manner.  Charlie Pitter was used to cover the entire operative field.  Preoperatively, the patient had about 10 degrees of external rotation and about 60 degrees isolated glenohumeral abduction and about 60-70 degrees of isolated forward flexion.  Deltopectoral approach was made.  Skin and subcutaneous tissue were sharply divided over about a 12- cm incision.  Cephalic vein was mobilized medially.  At this time, the patient had altered anatomy from her malunion/nonunion of the head and shaft.  The biceps tendon was identified, tenodesed to the pec tendon. The circumflex vessels were identified, ligated, and suture  ligated. Axillary nerve was palpated, visualized, and tagged with a vessel loop. This was protected at all times during the case.  At this time, subscap was taken off the lesser tuberosity in pedal fashion.  Anterior capsulotomy was performed with the axillary nerve protected.  At this times, osteophytes were removed with an osteotome.  The head was removed with an osteotome.  The greater tuberosity had a fibrous union to the shaft.  This greater tuberosity was shelled out, but the supraspinatus and infraspinatus were left intact.  At this time, loose bodies were removed.  Careful capsular release was performed from the 6 o'clock to 12 o'clock position anteriorly.  Very large osteophytic mass was removed from the inferior aspect of the humeral head and neck region.  This actually gave more excursion once this was removed.  Following preparation of the shaft, the glenoid was repaired.  Using a bur, the vault was ensured.  This was in the relatively center-center location of the glenoid, which was well-exposed with the labrum removed.  Biceps tendon was also removed superiorly.  The reaming was then performed over a guidepin, which was placed at about a 10-degree inferior inclination angle neutral to the glenoid itself.  The small baseplate with 30-ZS central compression screw, which had excellent compression and was placed.  Two 30-mm peripheral locking screws were placed.  A 36-mm glenosphere was then  placed into the [facility] taper.  The humeral shaft was then trialed, was then prepared.  Size 6 stem could not be seated low enough in order to reduce the cuff to the glenosphere.  Size 5 stem was then utilized and cemented into position at the correct height.  Trial reduction with a +3 and +6 liner was then performed.  The +6 liner gave excellent stability with internal and external rotation, inferior translation.  At this time, thorough irrigation was performed.  The true liner was placed.   Same stability parameters were maintained.  Axillary nerve was palpated and found to be intact.  Should be noted that cement spacer was utilized.  Thorough irrigation performed both before and after placement of final components.  The subscap was then repaired to the prosthesis and also to the infraspinatus superiorly with two sutures.  The rotator interval was left open.  Subscap repaired basically to bone where possible and also to the prosthesis. Deltopectoral interval was closed using running #1 Vicryl suture followed by interrupted inverted 0 Vicryl suture, 2-0 Vicryl suture, and running 3-0 Monocryl.  The patient tolerated the procedure well without immediate complication.  Transferred to the recovery room in stable condition after placing an Aquacel dressing and a sling.     Anderson Malta, M.D.     GSD/MEDQ  D:  07/14/2014  T:  07/15/2014  Job:  [id number]

## 2014-07-15 NOTE — Evaluation (Signed)
Occupational Therapy Evaluation Patient Details Name: Megan [name] MRN: [id number] DOB: Mar 16, 1934 Today's Date: 07/15/2014    History of Present Illness Left reverse TSA   Clinical Impression   This 80 yo female admitted and underwent above presents to acute OT with decreased use of LUE and increased pain all affecting his ability to care for herself at an Independent level as she was pta. She will benefit from acute OT with follow up [place] to be to an independent to Mod I level.    Follow Up Recommendations  Home health OT    Equipment Recommendations  None recommended by OT       Precautions / Restrictions Precautions Precautions: Shoulder Type of Shoulder Precautions: Active protocol  Shoulder Interventions: For comfort (and sleep) Required Braces or Orthoses: Sling Restrictions Weight Bearing Restrictions: Yes LUE Weight Bearing: Non weight bearing      Mobility Bed Mobility Overal bed mobility: Needs Assistance Bed Mobility: Rolling;Sidelying to Sit Rolling: Supervision Sidelying to sit: Min guard          Transfers Overall transfer level: Needs assistance Equipment used: 1 person hand held assist Transfers: Sit to/from Stand Sit to Stand: Min assist                   ADL Overall ADL's : Needs assistance/impaired Eating/Feeding: Set up;Sitting   Grooming: Minimal assistance;Sitting   Upper Body Bathing: Minimal assitance;Sitting;Standing   Lower Body Bathing: Minimal assistance;Sit to/from stand   Upper Body Dressing : Minimal assistance;Sitting   Lower Body Dressing: Minimal assistance;Sit to/from stand   Toilet Transfer: Min guard;Ambulation;Comfort height toilet   Toileting- Clothing Manipulation and Hygiene: Min guard;Sit to/from stand               Vision Additional Comments: No change from baseline                Hand Dominance Right   Extremity/Trunk Assessment Upper Extremity Assessment Upper Extremity  Assessment: LUE deficits/detail LUE Deficits / Details: [place] sx this admission LUE Coordination: decreased gross motor           Communication Communication Communication: No difficulties   Cognition Arousal/Alertness: Awake/alert Behavior During Therapy: WFL for tasks assessed/performed Overall Cognitive Status:  (difficulty following instructions for exercises)                        Exercises   Other Exercises Other Exercises: Supine pt performed 10 reps of AAROM for shoulder flexion (with hands clasped together); shoulder external rotation (with long handled shoe horn), and shoulder abduction in sitting ("rocking the baby'). AROM elbow flexion/extension   Shoulder Instructions Shoulder Instructions Donning/doffing shirt without moving shoulder:  (to go over tomorrow with son present) Method for sponge bathing under operated UE:  (to go over tomorrow with son present) Donning/doffing sling/immobilizer: Maximal assistance Correct positioning of sling/immobilizer: Maximal assistance Pendulum exercises (written home exercise program):  (NA) ROM for elbow, wrist and digits of operated UE: Supervision/safety Sling wearing schedule (on at all times/off for ADL's): Supervision/safety Dressing change:  (NA) Positioning of UE while sleeping:  (to go over tomorrow with son present)    [place] expects to be discharged to:: Private residence Living Arrangements: Children (son) Available Help at Discharge: Family;Available 24 hours/day                         Home Equipment: None  Prior Functioning/Environment Level of Independence: Independent             OT Diagnosis: Generalized weakness;Acute pain   OT Problem List: Decreased strength;Decreased range of motion;Decreased cognition;Impaired balance (sitting and/or standing);Pain;Impaired UE functional use;Obesity   OT Treatment/Interventions: Self-care/ADL  training;Patient/family education;Balance training;DME and/or AE instruction;Therapeutic exercise    OT Goals(Current goals can be found in the care plan section) Acute Rehab OT Goals Patient Stated Goal: home tomorrow OT Goal Formulation: With patient Time For Goal Achievement: 07/16/14 Potential to Achieve Goals: Good  OT Frequency: Min 2X/week              End of Session Equipment Utilized During Treatment:  (sling)  Activity Tolerance: Patient tolerated treatment well Patient left: in chair;with call bell/phone within reach   Time: 0920-1008 OT Time Calculation (min): 48 min Charges:  OT General Charges $OT Visit: 1 Procedure OT Evaluation $Initial OT Evaluation Tier I: 1 Procedure OT Treatments $Therapeutic Exercise: 23-37 mins  Almon Register [phone] 07/15/2014, 2:53 PM

## 2014-07-16 LAB — GLUCOSE, CAPILLARY: GLUCOSE-CAPILLARY: 127 mg/dL — AB (ref 65–99)

## 2014-07-16 NOTE — Discharge Instructions (Addendum)
Use your incentive spirometry 5 minutes every hour while awake to open lungs, coughing helps also. General Anesthesia, Care After Refer to this sheet in the next few weeks. These instructions provide you with information on caring for yourself after your procedure. Your health care provider may also give you more specific instructions. Your treatment has been planned according to current medical practices, but problems sometimes occur. Call your health care provider if you have any problems or questions after your procedure. WHAT TO EXPECT AFTER THE PROCEDURE After the procedure, it is typical to experience:  Sleepiness.  Nausea and vomiting. HOME CARE INSTRUCTIONS  For the first 24 hours after general anesthesia:  Have a responsible person with you.  Do not drive a car. If you are alone, do not take public transportation.  Do not drink alcohol.  Do not take medicine that has not been prescribed by your health care provider.  Do not sign important papers or make important decisions.  You may resume a normal diet and activities as directed by your health care provider.  Change bandages (dressings) as directed.  If you have questions or problems that seem related to general anesthesia, call the hospital and ask for the anesthetist or anesthesiologist on call. SEEK MEDICAL CARE IF:  You have nausea and vomiting that continue the day after anesthesia.  You develop a rash. SEEK IMMEDIATE MEDICAL CARE IF:   You have difficulty breathing.  You have chest pain.  You have any allergic problems. Document Released: 04/22/2000 Document Revised: 01/19/2013 Document Reviewed: 07/30/2012 [facility] Patient Information 2015 [place], [place]. This information is not intended to replace advice given to you by your health care provider. Make sure you discuss any questions you have with your health care provider.

## 2014-07-16 NOTE — Progress Notes (Addendum)
Patient given discharge instructions as well as prescription from MD. OT and case management also saw pt. prior to discharge. Son at the bedside and transport notified.  Desmond Dike RN

## 2014-07-16 NOTE — Progress Notes (Signed)
Occupational Therapy Treatment Patient Details Name: KENIYA SCHLOTTERBECK MRN: [id number] DOB: Jul 18, 1934 Today's Date: 07/16/2014    History of present illness Left reverse TSA   OT comments  All education completed with pt/family. Pt ready for D/C. Would benefit from [facility].  Follow Up Recommendations  Supervision/Assistance - 24 hour;Home health OT    Equipment Recommendations  None recommended by OT    Recommendations for Other Services      Precautions / Restrictions Precautions Precautions: Shoulder Type of Shoulder Precautions: Active protocol  Shoulder Interventions: For comfort Required Braces or Orthoses: Sling Restrictions LUE Weight Bearing: Non weight bearing       Mobility Bed Mobility Overal bed mobility: Modified Independent                Transfers Overall transfer level: Modified independent                    Balance                                   ADL                                         General ADL Comments: Completed education with pt regarding compensatory techniques for ADL. Educated son also on techniques. Pt able to return demosntrate with min vc. Educated on doning/doffing sling and positioning in bed and chair.      Vision                     Perception     Praxis      Cognition   Behavior During Therapy: [facility] for tasks assessed/performed Overall Cognitive Status: Within Functional Limits for tasks assessed                       Extremity/Trunk Assessment               Exercises Other Exercises Other Exercises: Supine pt performed 10 reps of AAROM for shoulder flexion (with hands clasped together); shoulder external rotation (with long handled shoe horn), and shoulder abduction in sitting ("rocking the baby'). AROM elbow flexion/extension (Education completed regarding writtent HEP with son) Donning/doffing shirt without moving shoulder: Minimal assistance Method  for sponge bathing under operated UE: Supervision/safety;Set-up Donning/doffing sling/immobilizer: Minimal assistance Correct positioning of sling/immobilizer: Supervision/safety ROM for elbow, wrist and digits of operated UE: Modified independent;Caregiver independent with task Sling wearing schedule (on at all times/off for ADL's): Modified independent;Caregiver independent with task Positioning of UE while sleeping: Modified independent;Patient able to independently direct caregiver   Shoulder Instructions Shoulder Instructions Donning/doffing shirt without moving shoulder: Minimal assistance Method for sponge bathing under operated UE: Supervision/safety;Set-up Donning/doffing sling/immobilizer: Minimal assistance Correct positioning of sling/immobilizer: Supervision/safety ROM for elbow, wrist and digits of operated UE: Modified independent;Caregiver independent with task Sling wearing schedule (on at all times/off for ADL's): Modified independent;Caregiver independent with task Positioning of UE while sleeping: Modified independent;Patient able to independently direct caregiver     General Comments      Pertinent Vitals/ Pain       Pain Assessment: 0-10 Pain Score: 5  Pain Location: L shoulder Pain Descriptors / Indicators: Aching Pain Intervention(s): Limited activity within patient's tolerance;Monitored during session;Repositioned  Home Living  Prior Functioning/Environment              Frequency Min 2X/week     Progress Toward Goals  OT Goals(current goals can now be found in the care plan section)  Progress towards OT goals: Goals met/education completed, patient discharged from OT  Acute Rehab OT Goals Patient Stated Goal: to go home OT Goal Formulation: With patient Time For Goal Achievement: 07/16/14 Potential to Achieve Goals: Good ADL Goals Pt Will Perform Upper Body Bathing: with caregiver  independent in assisting Pt Will Perform Lower Body Bathing: with caregiver independent in assisting Pt Will Perform Upper Body Dressing: with caregiver independent in assisting Pt Will Perform Lower Body Dressing: with caregiver independent in assisting Pt Will Transfer to Toilet: with supervision;ambulating Pt Will Perform Toileting - Clothing Manipulation and hygiene: with supervision;sit to/from stand Pt/caregiver will Perform Home Exercise Program: Increased ROM;Increased strength;Left upper extremity;With written HEP provided Additional ADL Goal #1: Pt's cargiver will be independent in donning/doffing sling Additional ADL Goal #2: Pt will be Mod I in and OOB for BADLs  Plan Discharge plan remains appropriate    Co-evaluation                 End of Session     Activity Tolerance Patient tolerated treatment well   Patient Left in chair;with call bell/phone within reach   Nurse Communication Other (comment) (ready for D/C)        Time: 5301-0404 OT Time Calculation (min): 34 min  Charges: OT General Charges $OT Visit: 1 Procedure OT Treatments $Self Care/Home Management : 23-37 mins  Kaesha Kirsch,HILLARY 07/16/2014, 12:54 PM   [facility], OTR/L  [phone] 07/16/2014

## 2014-07-16 NOTE — Progress Notes (Signed)
Patient ID: Megan [name], female   DOB: 09/18/1934, 80 y.o.   MRN: [id number]     Subjective: 2 Days Post-Op Procedure(s) (LRB): LEFT REVERSE SHOULDER ARTHROPLASTY (Left) Awake, alert and oriented x 4. Mild discomfort. Has O2 per nasal cannula in place this morning.No dyspnea, no SOB. O2 discontinued.  Patient reports pain as mild.    Objective:   VITALS:  Temp:  [97.1 F (36.2 C)-100.2 F (37.9 C)] 97.1 F (36.2 C) (06/18 0537) Pulse Rate:  [80-97] 80 (06/18 0537) Resp:  [16-18] 18 (06/18 0537) BP: (111-130)/(46-50) 130/46 mmHg (06/18 0537) SpO2:  [90 %-92 %] 92 % (06/18 0537)  Neurologically intact ABD soft Neurovascular intact Sensation intact distally Intact pulses distally Dorsiflexion/Plantar flexion intact Incision: dressing C/D/I   LABS No results for input(s): HGB, WBC, PLT in the last 72 hours. No results for input(s): NA, K, CL, CO2, BUN, CREATININE, GLUCOSE in the last 72 hours. No results for input(s): LABPT, INR in the last 72 hours.   Assessment/Plan: 2 Days Post-Op Procedure(s) (LRB): LEFT REVERSE SHOULDER ARTHROPLASTY (Left)  D/C IV fluids Discharge home with home health  Rx for robaxin, asa and percocet given to patient. Check Oxygen saturation off oxygen prior to discharge.  NITKA,JAMES E 07/16/2014, 9:42 AM

## 2014-07-16 NOTE — Care Management Note (Signed)
Case Management Note  Patient Details  Name: Megan [name] MRN: [id number] Date of Birth: July 28, 1934  Subjective/Objective:  S/p left shoulder reverse shoulder replacement                  Action/Plan: Discharge planning, see comments.  Expected Discharge Date: 07/16/14               Expected Discharge Plan:  [place]  In-House Referral:  NA  Discharge planning Services  CM Consult  Post Acute Care Choice:  Home Health, Durable Medical Equipment Choice offered to:  Patient  DME Arranged:  CPM DME Agency:  [employer]  HH Arranged:  PT, OT HH Agency:  [facility]  Status of Service:  Completed, signed off  Medicare Important Message Given:  N/A - LOS <3 / Initial given by admissions Date Medicare IM Given:    Medicare IM give by:    Date Additional Medicare IM Given:    Additional Medicare Important Message give by:     If discussed at [place] of Stay Meetings, dates discussed:    Additional Comments:  CM spoke to patient and family member (Son) who state that they have no further discharge questions or concerns. Patient HH/DME set up with [facility] [employer] [facility] per previous CM note. No further CM needs communicated.    Guido Sander, RN 07/16/2014, 10:29 AM

## 2014-07-18 ENCOUNTER — Encounter (HOSPITAL_COMMUNITY): Payer: Self-pay | Admitting: Orthopedic Surgery

## 2014-07-18 DIAGNOSIS — Z4731 Aftercare following explantation of shoulder joint prosthesis: Secondary | ICD-10-CM | POA: Diagnosis not present

## 2014-07-18 DIAGNOSIS — I10 Essential (primary) hypertension: Secondary | ICD-10-CM | POA: Diagnosis not present

## 2014-07-18 DIAGNOSIS — Z9181 History of falling: Secondary | ICD-10-CM | POA: Diagnosis not present

## 2014-07-18 DIAGNOSIS — E119 Type 2 diabetes mellitus without complications: Secondary | ICD-10-CM | POA: Diagnosis not present

## 2014-07-18 DIAGNOSIS — M19012 Primary osteoarthritis, left shoulder: Secondary | ICD-10-CM | POA: Diagnosis not present

## 2014-07-18 DIAGNOSIS — Z96612 Presence of left artificial shoulder joint: Secondary | ICD-10-CM | POA: Diagnosis not present

## 2014-07-20 ENCOUNTER — Encounter (HOSPITAL_COMMUNITY): Payer: Self-pay | Admitting: Orthopedic Surgery

## 2014-07-21 DIAGNOSIS — M19012 Primary osteoarthritis, left shoulder: Secondary | ICD-10-CM | POA: Diagnosis not present

## 2014-07-21 DIAGNOSIS — Z9181 History of falling: Secondary | ICD-10-CM | POA: Diagnosis not present

## 2014-07-21 DIAGNOSIS — E119 Type 2 diabetes mellitus without complications: Secondary | ICD-10-CM | POA: Diagnosis not present

## 2014-07-21 DIAGNOSIS — Z96612 Presence of left artificial shoulder joint: Secondary | ICD-10-CM | POA: Diagnosis not present

## 2014-07-21 DIAGNOSIS — Z4731 Aftercare following explantation of shoulder joint prosthesis: Secondary | ICD-10-CM | POA: Diagnosis not present

## 2014-07-21 DIAGNOSIS — I10 Essential (primary) hypertension: Secondary | ICD-10-CM | POA: Diagnosis not present

## 2014-07-21 NOTE — Addendum Note (Signed)
Addendum  created 07/21/14 0657 by Finis Bud, MD   Modules edited: Anesthesia Attestations, Clinical Notes   Clinical Notes:  File: [id number]

## 2014-07-22 DIAGNOSIS — Z4731 Aftercare following explantation of shoulder joint prosthesis: Secondary | ICD-10-CM | POA: Diagnosis not present

## 2014-07-22 DIAGNOSIS — M19012 Primary osteoarthritis, left shoulder: Secondary | ICD-10-CM | POA: Diagnosis not present

## 2014-07-22 DIAGNOSIS — Z9181 History of falling: Secondary | ICD-10-CM | POA: Diagnosis not present

## 2014-07-22 DIAGNOSIS — E119 Type 2 diabetes mellitus without complications: Secondary | ICD-10-CM | POA: Diagnosis not present

## 2014-07-22 DIAGNOSIS — I10 Essential (primary) hypertension: Secondary | ICD-10-CM | POA: Diagnosis not present

## 2014-07-22 DIAGNOSIS — Z96612 Presence of left artificial shoulder joint: Secondary | ICD-10-CM | POA: Diagnosis not present

## 2014-07-25 DIAGNOSIS — Z96612 Presence of left artificial shoulder joint: Secondary | ICD-10-CM | POA: Diagnosis not present

## 2014-07-26 DIAGNOSIS — E119 Type 2 diabetes mellitus without complications: Secondary | ICD-10-CM | POA: Diagnosis not present

## 2014-07-26 DIAGNOSIS — Z96612 Presence of left artificial shoulder joint: Secondary | ICD-10-CM | POA: Diagnosis not present

## 2014-07-26 DIAGNOSIS — Z9181 History of falling: Secondary | ICD-10-CM | POA: Diagnosis not present

## 2014-07-26 DIAGNOSIS — M19012 Primary osteoarthritis, left shoulder: Secondary | ICD-10-CM | POA: Diagnosis not present

## 2014-07-26 DIAGNOSIS — I10 Essential (primary) hypertension: Secondary | ICD-10-CM | POA: Diagnosis not present

## 2014-07-26 DIAGNOSIS — Z4731 Aftercare following explantation of shoulder joint prosthesis: Secondary | ICD-10-CM | POA: Diagnosis not present

## 2014-08-02 DIAGNOSIS — E118 Type 2 diabetes mellitus with unspecified complications: Secondary | ICD-10-CM | POA: Diagnosis not present

## 2014-08-02 DIAGNOSIS — Z96612 Presence of left artificial shoulder joint: Secondary | ICD-10-CM | POA: Diagnosis not present

## 2014-08-03 NOTE — Discharge Summary (Signed)
Physician Discharge Summary  Patient ID: Megan [name] MRN: [id number] DOB/AGE: [birth year] 80 y.o.  Admit date: 07/14/2014 Discharge date: 07/16/2014  Admission Diagnoses:  Active Problems:   Arthritis of shoulder region, left, degenerative   Discharge Diagnoses:  Same  Surgeries: Procedure(s): LEFT REVERSE SHOULDER ARTHROPLASTY on 07/14/2014   Consultants:    Discharged Condition: Stable  Hospital Course: Megan [name] is an 80 y.o. female who was admitted 07/14/2014 with a chief complaint of shoulder pain, and found to have a diagnosis of rotator cuff arthropathy.  They were brought to the operating room on 07/14/2014 and underwent the above named procedures.She tolerated the procedure well and was transferred to home in good condition on POD 2.    Antibiotics given:  Anti-infectives    Start     Dose/Rate Route Frequency Ordered Stop   07/14/14 1530  ceFAZolin (ANCEF) IVPB 2 g/50 mL premix     2 g 100 mL/hr over 30 Minutes Intravenous Every 6 hours 07/14/14 1435 07/14/14 2200   07/14/14 0615  ceFAZolin (ANCEF) IVPB 2 g/50 mL premix     2 g 100 mL/hr over 30 Minutes Intravenous To [facility] 07/13/14 1328 07/14/14 0730    .  Recent vital signs:  Filed Vitals:   07/16/14 0537  BP: 130/46  Pulse: 80  Temp: 97.1 F (36.2 C)  Resp: 18    Recent laboratory studies:  Results for orders placed or performed during the hospital encounter of 07/14/14  Glucose, capillary  Result Value Ref Range   Glucose-Capillary 149 (H) 65 - 99 mg/dL   Comment 1 Notify RN    Comment 2 Document in Chart   Glucose, capillary  Result Value Ref Range   Glucose-Capillary 155 (H) 65 - 99 mg/dL   Comment 1 Notify RN   Glucose, capillary  Result Value Ref Range   Glucose-Capillary 176 (H) 65 - 99 mg/dL  Glucose, capillary  Result Value Ref Range   Glucose-Capillary 188 (H) 65 - 99 mg/dL  Glucose, capillary  Result Value Ref Range   Glucose-Capillary 158 (H) 65 - 99 mg/dL   Glucose, capillary  Result Value Ref Range   Glucose-Capillary 127 (H) 65 - 99 mg/dL    Discharge Medications:     Medication List    TAKE these medications        amLODipine-valsartan 5-160 MG per tablet  Commonly known as:  EXFORGE  Take 1 tablet by mouth daily.     aspirin 325 MG EC tablet  Take 1 tablet (325 mg total) by mouth daily.     calcium carbonate 1250 (500 CA) MG tablet  Commonly known as:  OS-CAL - dosed in mg of elemental calcium  Take 1 tablet by mouth daily with breakfast.     cholecalciferol 400 UNITS Tabs tablet  Commonly known as:  VITAMIN D  Take 400 Units by mouth daily.     Fish Oil 1000 MG Caps  Take 1,000 mg by mouth daily.     glimepiride 2 MG tablet  Commonly known as:  AMARYL  Take 2 mg by mouth daily with breakfast.     ketotifen 0.025 % ophthalmic solution  Commonly known as:  ZADITOR  Place 1 drop into both eyes 1 day or 1 dose.     multivitamin tablet  Take 1 tablet by mouth daily.     MYRBETRIQ 50 MG Tb24 tablet  Generic drug:  mirabegron ER  Take 50 mg by mouth daily.  NESINA 25 MG Tabs  Generic drug:  Alogliptin Benzoate  Take 1 tablet by mouth daily.     oxyCODONE 5 MG immediate release tablet  Commonly known as:  Oxy IR/ROXICODONE  Take 1-2 tablets (5-10 mg total) by mouth every 3 (three) hours as needed for breakthrough pain.     ranitidine 150 MG capsule  Commonly known as:  ZANTAC  Take 150 mg by mouth 2 (two) times daily.     SYNTHROID 125 MCG tablet  Generic drug:  levothyroxine  Take 125 mcg by mouth daily.     triamterene-hydrochlorothiazide 37.5-25 MG per capsule  Commonly known as:  DYAZIDE  Take 1 capsule by mouth daily as needed.        Diagnostic Studies: Dg Shoulder Left Port  07/14/2014   CLINICAL DATA:  Postop left shoulder.  EXAM: LEFT SHOULDER - 1 VIEW  COMPARISON:  04/11/2014  FINDINGS: Patient has undergone interval placement of a left shoulder arthroplasty with glenoid and humeral  prosthetic components intact. Opacification of the left base as cannot exclude effusions/atelectasis versus infection.  IMPRESSION: Left shoulder arthroplasty with prosthetic components intact.  Left base opacification which may be due to atelectasis/effusion versus infection. Recommend chest radiograph correlation.   Electronically Signed   By: Marin Olp M.D.   On: 07/14/2014 13:44    Disposition: 01-Home or Self Care      Discharge Instructions    Call MD / Call 911    Complete by:  As directed   If you experience chest pain or shortness of breath, CALL 911 and be transported to the hospital emergency room.  If you develope a fever above 101 F, pus (white drainage) or increased drainage or redness at the wound, or calf pain, call your surgeon's office.     Call MD / Call 911    Complete by:  As directed   If you experience chest pain or shortness of breath, CALL 911 and be transported to the hospital emergency room.  If you develope a fever above 101 F, pus (white drainage) or increased drainage or redness at the wound, or calf pain, call your surgeon's office.     Constipation Prevention    Complete by:  As directed   Drink plenty of fluids.  Prune juice may be helpful.  You may use a stool softener, such as Colace (over the counter) 100 mg twice a day.  Use MiraLax (over the counter) for constipation as needed.     Constipation Prevention    Complete by:  As directed   Drink plenty of fluids.  Prune juice may be helpful.  You may use a stool softener, such as Colace (over the counter) 100 mg twice a day.  Use MiraLax (over the counter) for constipation as needed.     Diet - low sodium heart healthy    Complete by:  As directed      Discharge instructions    Complete by:  As directed   Use sling for comfort only Ok for range of motion as tolerated Keep incision dry CPM 3 hours per day - increase as tolerated     Discharge instructions    Complete by:  As directed   As per Dr.  Marlou Sa. Use incentive spirometry 5 minutes every hour while awake to open lungs.     Increase activity slowly as tolerated    Complete by:  As directed            Follow-up Information  Follow up with [facility].   Why:  They will contact you to schule home therapy visits.   Contact information:   [place] [address] [phone]       Follow up with Meredith Pel, MD On 07/25/2014.   Specialty:  Orthopedic Surgery   Why:  For wound re-check   Contact information:   [place] [address] [phone]       Follow up with [facility].   Why:  Home Health Physical Therapy and Occupational Therapy   Contact information:   [place] [address] [phone]        Signed: Meredith Pel 08/03/2014, 11:39 PM

## 2014-08-05 DIAGNOSIS — Z96612 Presence of left artificial shoulder joint: Secondary | ICD-10-CM | POA: Diagnosis not present

## 2014-08-16 DIAGNOSIS — I10 Essential (primary) hypertension: Secondary | ICD-10-CM | POA: Diagnosis not present

## 2014-08-16 DIAGNOSIS — E118 Type 2 diabetes mellitus with unspecified complications: Secondary | ICD-10-CM | POA: Diagnosis not present

## 2014-08-16 DIAGNOSIS — E039 Hypothyroidism, unspecified: Secondary | ICD-10-CM | POA: Diagnosis not present

## 2014-08-16 DIAGNOSIS — Z96612 Presence of left artificial shoulder joint: Secondary | ICD-10-CM | POA: Diagnosis not present

## 2014-08-17 DIAGNOSIS — Z85820 Personal history of malignant melanoma of skin: Secondary | ICD-10-CM | POA: Diagnosis not present

## 2014-08-17 DIAGNOSIS — D225 Melanocytic nevi of trunk: Secondary | ICD-10-CM | POA: Diagnosis not present

## 2014-08-17 DIAGNOSIS — Z08 Encounter for follow-up examination after completed treatment for malignant neoplasm: Secondary | ICD-10-CM | POA: Diagnosis not present

## 2014-08-17 DIAGNOSIS — L814 Other melanin hyperpigmentation: Secondary | ICD-10-CM | POA: Diagnosis not present

## 2014-09-05 DIAGNOSIS — Z96612 Presence of left artificial shoulder joint: Secondary | ICD-10-CM | POA: Diagnosis not present

## 2014-10-10 DIAGNOSIS — Z96612 Presence of left artificial shoulder joint: Secondary | ICD-10-CM | POA: Diagnosis not present

## 2014-11-10 ENCOUNTER — Other Ambulatory Visit: Payer: Self-pay

## 2014-11-10 DIAGNOSIS — Z1231 Encounter for screening mammogram for malignant neoplasm of breast: Secondary | ICD-10-CM

## 2014-11-14 DIAGNOSIS — S42292S Other displaced fracture of upper end of left humerus, sequela: Secondary | ICD-10-CM | POA: Diagnosis not present

## 2014-11-17 DIAGNOSIS — H25012 Cortical age-related cataract, left eye: Secondary | ICD-10-CM | POA: Diagnosis not present

## 2014-11-17 DIAGNOSIS — E119 Type 2 diabetes mellitus without complications: Secondary | ICD-10-CM | POA: Diagnosis not present

## 2014-11-17 DIAGNOSIS — H2512 Age-related nuclear cataract, left eye: Secondary | ICD-10-CM | POA: Diagnosis not present

## 2014-11-17 DIAGNOSIS — H35033 Hypertensive retinopathy, bilateral: Secondary | ICD-10-CM | POA: Diagnosis not present

## 2014-11-17 DIAGNOSIS — H35373 Puckering of macula, bilateral: Secondary | ICD-10-CM | POA: Diagnosis not present

## 2014-11-18 ENCOUNTER — Ambulatory Visit
Admission: RE | Admit: 2014-11-18 | Discharge: 2014-11-18 | Disposition: A | Discharge status: Home / Self Care | Payer: Medicare Other | Source: Ambulatory Visit

## 2014-11-18 DIAGNOSIS — Z1231 Encounter for screening mammogram for malignant neoplasm of breast: Secondary | ICD-10-CM

## 2015-01-29 HISTORY — PX: EYE SURGERY: SHX253

## 2015-02-09 DIAGNOSIS — E118 Type 2 diabetes mellitus with unspecified complications: Secondary | ICD-10-CM | POA: Diagnosis not present

## 2015-02-09 DIAGNOSIS — E039 Hypothyroidism, unspecified: Secondary | ICD-10-CM | POA: Diagnosis not present

## 2015-02-09 DIAGNOSIS — I10 Essential (primary) hypertension: Secondary | ICD-10-CM | POA: Diagnosis not present

## 2015-02-16 DIAGNOSIS — E118 Type 2 diabetes mellitus with unspecified complications: Secondary | ICD-10-CM | POA: Diagnosis not present

## 2015-02-16 DIAGNOSIS — E032 Hypothyroidism due to medicaments and other exogenous substances: Secondary | ICD-10-CM | POA: Diagnosis not present

## 2015-02-16 DIAGNOSIS — R05 Cough: Secondary | ICD-10-CM | POA: Diagnosis not present

## 2015-02-16 DIAGNOSIS — I10 Essential (primary) hypertension: Secondary | ICD-10-CM | POA: Diagnosis not present

## 2015-03-20 DIAGNOSIS — H25012 Cortical age-related cataract, left eye: Secondary | ICD-10-CM | POA: Diagnosis not present

## 2015-03-20 DIAGNOSIS — H35031 Hypertensive retinopathy, right eye: Secondary | ICD-10-CM | POA: Diagnosis not present

## 2015-03-20 DIAGNOSIS — H2511 Age-related nuclear cataract, right eye: Secondary | ICD-10-CM | POA: Diagnosis not present

## 2015-03-20 DIAGNOSIS — H2512 Age-related nuclear cataract, left eye: Secondary | ICD-10-CM | POA: Diagnosis not present

## 2015-03-20 DIAGNOSIS — H25011 Cortical age-related cataract, right eye: Secondary | ICD-10-CM | POA: Diagnosis not present

## 2015-04-11 DIAGNOSIS — H2512 Age-related nuclear cataract, left eye: Secondary | ICD-10-CM | POA: Diagnosis not present

## 2015-06-27 DIAGNOSIS — H2511 Age-related nuclear cataract, right eye: Secondary | ICD-10-CM | POA: Diagnosis not present

## 2015-07-03 DIAGNOSIS — Z01419 Encounter for gynecological examination (general) (routine) without abnormal findings: Secondary | ICD-10-CM | POA: Diagnosis not present

## 2015-10-31 DIAGNOSIS — I10 Essential (primary) hypertension: Secondary | ICD-10-CM | POA: Diagnosis not present

## 2015-10-31 DIAGNOSIS — E118 Type 2 diabetes mellitus with unspecified complications: Secondary | ICD-10-CM | POA: Diagnosis not present

## 2015-10-31 DIAGNOSIS — E789 Disorder of lipoprotein metabolism, unspecified: Secondary | ICD-10-CM | POA: Diagnosis not present

## 2015-11-07 DIAGNOSIS — I10 Essential (primary) hypertension: Secondary | ICD-10-CM | POA: Diagnosis not present

## 2015-11-07 DIAGNOSIS — E118 Type 2 diabetes mellitus with unspecified complications: Secondary | ICD-10-CM | POA: Diagnosis not present

## 2015-11-07 DIAGNOSIS — N3281 Overactive bladder: Secondary | ICD-10-CM | POA: Diagnosis not present

## 2015-11-20 ENCOUNTER — Other Ambulatory Visit: Payer: Self-pay | Admitting: Endocrinology

## 2015-11-20 DIAGNOSIS — Z1231 Encounter for screening mammogram for malignant neoplasm of breast: Secondary | ICD-10-CM

## 2015-12-15 ENCOUNTER — Ambulatory Visit
Admission: RE | Admit: 2015-12-15 | Discharge: 2015-12-15 | Disposition: A | Discharge status: Home / Self Care | Payer: Medicare Other | Source: Ambulatory Visit | Attending: Endocrinology | Admitting: Endocrinology

## 2015-12-15 DIAGNOSIS — Z1231 Encounter for screening mammogram for malignant neoplasm of breast: Secondary | ICD-10-CM | POA: Diagnosis not present

## 2015-12-27 DIAGNOSIS — E118 Type 2 diabetes mellitus with unspecified complications: Secondary | ICD-10-CM | POA: Diagnosis not present

## 2016-01-03 ENCOUNTER — Other Ambulatory Visit: Payer: Self-pay | Admitting: Endocrinology

## 2016-01-03 DIAGNOSIS — Z79899 Other long term (current) drug therapy: Secondary | ICD-10-CM | POA: Diagnosis not present

## 2016-01-03 DIAGNOSIS — I10 Essential (primary) hypertension: Secondary | ICD-10-CM | POA: Diagnosis not present

## 2016-01-03 DIAGNOSIS — E118 Type 2 diabetes mellitus with unspecified complications: Secondary | ICD-10-CM | POA: Diagnosis not present

## 2016-01-03 DIAGNOSIS — R42 Dizziness and giddiness: Secondary | ICD-10-CM | POA: Diagnosis not present

## 2016-01-04 ENCOUNTER — Other Ambulatory Visit: Payer: Self-pay | Admitting: Endocrinology

## 2016-01-04 ENCOUNTER — Ambulatory Visit
Admission: RE | Admit: 2016-01-04 | Discharge: 2016-01-04 | Disposition: A | Discharge status: Home / Self Care | Payer: Medicare Other | Source: Ambulatory Visit | Attending: Endocrinology | Admitting: Endocrinology

## 2016-01-04 DIAGNOSIS — R260 Ataxic gait: Secondary | ICD-10-CM

## 2016-01-04 DIAGNOSIS — R42 Dizziness and giddiness: Secondary | ICD-10-CM | POA: Diagnosis not present

## 2016-01-16 DIAGNOSIS — R42 Dizziness and giddiness: Secondary | ICD-10-CM | POA: Diagnosis not present

## 2016-02-02 DIAGNOSIS — R42 Dizziness and giddiness: Secondary | ICD-10-CM | POA: Diagnosis not present

## 2016-02-02 DIAGNOSIS — H903 Sensorineural hearing loss, bilateral: Secondary | ICD-10-CM | POA: Insufficient documentation

## 2016-02-05 DIAGNOSIS — H35373 Puckering of macula, bilateral: Secondary | ICD-10-CM | POA: Diagnosis not present

## 2016-02-05 DIAGNOSIS — H35033 Hypertensive retinopathy, bilateral: Secondary | ICD-10-CM | POA: Diagnosis not present

## 2016-02-05 DIAGNOSIS — Z8679 Personal history of other diseases of the circulatory system: Secondary | ICD-10-CM | POA: Diagnosis not present

## 2016-02-05 DIAGNOSIS — Z961 Presence of intraocular lens: Secondary | ICD-10-CM | POA: Diagnosis not present

## 2016-02-05 DIAGNOSIS — E119 Type 2 diabetes mellitus without complications: Secondary | ICD-10-CM | POA: Diagnosis not present

## 2016-03-07 ENCOUNTER — Encounter (INDEPENDENT_AMBULATORY_CARE_PROVIDER_SITE_OTHER): Payer: Self-pay | Admitting: Orthopedic Surgery

## 2016-03-07 ENCOUNTER — Ambulatory Visit (INDEPENDENT_AMBULATORY_CARE_PROVIDER_SITE_OTHER): Payer: Medicare Other | Admitting: Orthopedic Surgery

## 2016-03-07 ENCOUNTER — Ambulatory Visit (INDEPENDENT_AMBULATORY_CARE_PROVIDER_SITE_OTHER): Payer: Medicare Other

## 2016-03-07 DIAGNOSIS — G8929 Other chronic pain: Secondary | ICD-10-CM

## 2016-03-07 DIAGNOSIS — M25511 Pain in right shoulder: Secondary | ICD-10-CM

## 2016-03-07 MED ORDER — LIDOCAINE HCL 1 % IJ SOLN
5.0000 mL | INTRAMUSCULAR | Status: AC | PRN
  Administered 2016-03-07: 5 mL

## 2016-03-07 MED ORDER — BUPIVACAINE HCL 0.5 % IJ SOLN
9.0000 mL | INTRAMUSCULAR | Status: AC | PRN
  Administered 2016-03-07: 9 mL via INTRA_ARTICULAR

## 2016-03-07 MED ORDER — METHYLPREDNISOLONE ACETATE 40 MG/ML IJ SUSP
40.0000 mg | INTRAMUSCULAR | Status: AC | PRN
  Administered 2016-03-07: 40 mg via INTRA_ARTICULAR

## 2016-03-07 NOTE — Progress Notes (Signed)
Office Visit Note   [name]           Date of Birth: 1934/07/11           MRN: [id number] Visit Date: 03/07/2016 Requested by: No referring provider defined for this encounter. PCP: Dwan Bolt, MD  Subjective: Chief Complaint  Patient presents with  . Right Shoulder - Pain    HPI Megan [name] is a 82 year old female with right shoulder pain.  Been bothering her for several months.  She had left proximal humerus fracture nonunion treated with reverse shoulder replacement.  That happened 2 years ago and she's done well with that.  She's having difficulty time getting dressed with the right shoulder.  Reports pain with motion.  Has had no injury.  She also reports a little bit of a flareup of some left-sided sciatica.  She's had shots before for that problem but the last set was 5 years ago.  She sees Dr. Ernestina Patches for that issue.              Review of Systems All systems reviewed are negative as they relate to the chief complaint within the history of present illness.  Patient denies  fevers or chills.    Assessment & Plan: Visit Diagnoses:  1. Chronic right shoulder pain     Plan: Impression is right shoulder arthritis and flareup of left-sided sciatica.  Plan is injection into the glenohumeral joint today for some pain relief.  I think she needs to decide if the shoulder arthritis is bad enough for her to consider shoulder replacement.  Arthroscopic treatment is not particularly indicated at this time.  I'm going to see her back as needed.  Injection performed today.  Follow-Up Instructions: No Follow-up on file.   Orders:  Orders Placed This Encounter  Procedures  . XR Shoulder Right   No orders of the defined types were placed in this encounter.     Procedures: Large Joint Inj Date/Time: 03/07/2016 11:30 AM Performed by: Megan [name] Authorized by: Megan [name]   Consent Given by:  Patient Site marked: the procedure site was marked     Timeout: prior to procedure the correct patient, procedure, and site was verified   Indications:  Pain and diagnostic evaluation Location:  Shoulder Site:  R glenohumeral Prep: patient was prepped and draped in usual sterile fashion   Needle Size:  18 G Needle Length:  1.5 inches Approach:  Posterior Ultrasound Guidance: No   Fluoroscopic Guidance: No   Arthrogram: No   Medications:  5 mL lidocaine 1 %; 9 mL bupivacaine 0.5 %; 40 mg methylPREDNISolone acetate 40 MG/ML Aspiration Attempted: No   Patient tolerance:  Patient tolerated the procedure well with no immediate complications     Clinical Data: No additional findings.  Objective: Vital Signs: There were no vitals taken for this visit.  Physical Exam   Constitutional: Patient appears well-developed HEENT:  Head: Normocephalic Eyes:EOM are normal Neck: Normal range of motion Cardiovascular: Normal rate Pulmonary/chest: Effort normal Neurologic: Patient is alert Skin: Skin is warm Psychiatric: Patient has normal mood and affect    Ortho Exam examination of the right shoulder demonstrates restricted external rotation to only about 25.  Excellent rotator cuff strength isolated interspace interspace and subscap muscle testing.  No other masses lymph adenopathy or skin changes noted in the shoulder girdle region.  She has about 90 of forward flexion and about 70 of isolated glenohumeral abduction  Specialty Comments:  No  specialty comments available.  Imaging: Xr Shoulder Right  Result Date: 03/07/2016 AP axillary outlet view right shoulder reviewed.  Visualized lung fields are clear.  Inferior osteophyte off of the humeral head is noted.  Acromiohumeral distance is maintained.  Before meals joint arthritis is present.  On the axillary view there is end-stage bone-on-bone glenohumeral arthritis.    PMFS History: Patient Active Problem List   Diagnosis Date Noted  . Arthritis of shoulder region, left,  degenerative 07/14/2014   Past Medical History:  Diagnosis Date  . Arthritis   . Cancer ([place])    melanoma  . Diabetes mellitus without complication ([place])   . History of hiatal hernia   . Hypertension     No family history on file.  Past Surgical History:  Procedure Laterality Date  . APPENDECTOMY     82 yrs old  . CHOLECYSTECTOMY  95  . JOINT REPLACEMENT Right    knee   . REVERSE SHOULDER ARTHROPLASTY Left 07/14/2014  . REVERSE SHOULDER ARTHROPLASTY Left 07/14/2014   Procedure: LEFT REVERSE SHOULDER ARTHROPLASTY;  Surgeon: Megan Pel, MD;  Location: [facility];  Service: Orthopedics;  Laterality: Left;  . SHOULDER ARTHROSCOPY Left    Social History   Occupational History  . Not on file.   Social History Main Topics  . Smoking status: Never Smoker  . Smokeless tobacco: Never Used  . Alcohol use No  . Drug use: No  . Sexual activity: Not on file

## 2016-04-25 DIAGNOSIS — E118 Type 2 diabetes mellitus with unspecified complications: Secondary | ICD-10-CM | POA: Diagnosis not present

## 2016-04-25 DIAGNOSIS — E039 Hypothyroidism, unspecified: Secondary | ICD-10-CM | POA: Diagnosis not present

## 2016-04-25 DIAGNOSIS — I10 Essential (primary) hypertension: Secondary | ICD-10-CM | POA: Diagnosis not present

## 2016-05-02 DIAGNOSIS — E118 Type 2 diabetes mellitus with unspecified complications: Secondary | ICD-10-CM | POA: Diagnosis not present

## 2016-05-02 DIAGNOSIS — E032 Hypothyroidism due to medicaments and other exogenous substances: Secondary | ICD-10-CM | POA: Diagnosis not present

## 2016-05-02 DIAGNOSIS — R42 Dizziness and giddiness: Secondary | ICD-10-CM | POA: Diagnosis not present

## 2016-05-02 DIAGNOSIS — M25519 Pain in unspecified shoulder: Secondary | ICD-10-CM | POA: Diagnosis not present

## 2016-05-13 ENCOUNTER — Ambulatory Visit: Payer: Medicare Other | Admitting: Neurology

## 2016-05-20 ENCOUNTER — Encounter (INDEPENDENT_AMBULATORY_CARE_PROVIDER_SITE_OTHER): Payer: Self-pay | Admitting: Orthopedic Surgery

## 2016-05-20 ENCOUNTER — Ambulatory Visit (INDEPENDENT_AMBULATORY_CARE_PROVIDER_SITE_OTHER): Payer: Medicare Other | Admitting: Orthopedic Surgery

## 2016-05-20 DIAGNOSIS — M25511 Pain in right shoulder: Secondary | ICD-10-CM

## 2016-05-20 DIAGNOSIS — M545 Low back pain: Secondary | ICD-10-CM

## 2016-05-20 DIAGNOSIS — G8929 Other chronic pain: Secondary | ICD-10-CM | POA: Diagnosis not present

## 2016-05-20 NOTE — Progress Notes (Signed)
Office Visit Note   [name]           Date of Birth: 1934/11/16           MRN: [id number] Visit Date: 05/20/2016 Requested by: Anda Kraft, MD [address] [place], [place] [address] PCP: Dwan Bolt, MD  Subjective: Chief Complaint  Patient presents with  . Right Shoulder - Follow-up    HPI: Megan [name] is a 82 year old female with right shoulder pain.  She had an injection to 818.  This did not give her any relief.  Patient states that the pain is too much to deal with.  She wants to discuss proceeding with surgery.  She states that the shoulder hurts her every day.  Reports deltoid-type pain.  She does have family that can stay with her during surgical recovery.  She is on Plavix for potential neurological events.  She has a neurologist appointment pending on May 9.  She was dizzy 1 night and fell.  She subsequently been placed on Plavix.  She does have a history of having shots in her back as well.  She has had a good result with her left reverse shoulder replacement.              ROS: All systems reviewed are negative as they relate to the chief complaint within the history of present illness.  Patient denies  fevers or chills.   Assessment & Plan: Visit Diagnoses:  1. Chronic right shoulder pain   2. Low back pain, unspecified back pain laterality, unspecified chronicity, with sciatica presence unspecified     Plan: Impression is symptomatic shoulder arthritis in a 82 year old female whose had a good result with left proximal humeral nonunion treatment with reverse shoulder replacement.  I think based on her age and other reverse shoulder replacement is indicated.  Risks and benefits discussed including but limited to infection or vessel damage dislocation as well as potential limitation of function.  Patient understands and wishes to proceed.  All questions answered.  She will have to stop her Plavix 7 days prior to the procedure and that is all  pending neurologist evaluation.  I'm going to send her to Dr. Ernestina Patches so he could consider back injections which she has had in the past.  Follow-Up Instructions: No Follow-up on file.   Orders:  Orders Placed This Encounter  Procedures  . CT SHOULDER RIGHT WO CONTRAST  . Ambulatory referral to Physical Medicine Rehab   No orders of the defined types were placed in this encounter.     Procedures: No procedures performed   Clinical Data: No additional findings.  Objective: Vital Signs: There were no vitals taken for this visit.  Physical Exam:   Constitutional: Patient appears well-developed HEENT:  Head: Normocephalic Eyes:EOM are normal Neck: Normal range of motion Cardiovascular: Normal rate Pulmonary/chest: Effort normal Neurologic: Patient is alert Skin: Skin is warm Psychiatric: Patient has normal mood and affect    Ortho Exam: Orthopedic exam demonstrates about 120 of forward flexion on the right she has isolated glenohumeral abduction to about 80.  Rotator cuff strength is pretty reasonable to supraspinatus subscap and infraspinatus testing.  Course grinding and crepitus is present with passive range of motion.  Motor sensory function to the hand is intact.  Neck range of motion is good.  Specialty Comments:  No specialty comments available.  Imaging: No results found.   PMFS History: Patient Active Problem List   Diagnosis Date Noted  .  Arthritis of shoulder region, left, degenerative 07/14/2014   Past Medical History:  Diagnosis Date  . Arthritis   . Cancer ([place])    melanoma  . Diabetes mellitus without complication ([place])   . History of hiatal hernia   . Hypertension     No family history on file.  Past Surgical History:  Procedure Laterality Date  . APPENDECTOMY     82 yrs old  . CHOLECYSTECTOMY  95  . JOINT REPLACEMENT Right    knee   . REVERSE SHOULDER ARTHROPLASTY Left 07/14/2014  . REVERSE SHOULDER ARTHROPLASTY Left 07/14/2014    Procedure: LEFT REVERSE SHOULDER ARTHROPLASTY;  Surgeon: Meredith Pel, MD;  Location: [facility];  Service: Orthopedics;  Laterality: Left;  . SHOULDER ARTHROSCOPY Left    Social History   Occupational History  . Not on file.   Social History Main Topics  . Smoking status: Never Smoker  . Smokeless tobacco: Never Used  . Alcohol use No  . Drug use: No  . Sexual activity: Not on file

## 2016-05-29 ENCOUNTER — Ambulatory Visit
Admission: RE | Admit: 2016-05-29 | Discharge: 2016-05-29 | Disposition: A | Discharge status: Home / Self Care | Payer: Medicare Other | Source: Ambulatory Visit | Attending: Orthopedic Surgery | Admitting: Orthopedic Surgery

## 2016-05-29 DIAGNOSIS — M25511 Pain in right shoulder: Principal | ICD-10-CM

## 2016-05-29 DIAGNOSIS — M19011 Primary osteoarthritis, right shoulder: Secondary | ICD-10-CM | POA: Diagnosis not present

## 2016-05-29 DIAGNOSIS — G8929 Other chronic pain: Secondary | ICD-10-CM

## 2016-05-30 ENCOUNTER — Ambulatory Visit (INDEPENDENT_AMBULATORY_CARE_PROVIDER_SITE_OTHER): Payer: Medicare Other | Admitting: Physical Medicine and Rehabilitation

## 2016-05-30 ENCOUNTER — Encounter (INDEPENDENT_AMBULATORY_CARE_PROVIDER_SITE_OTHER): Payer: Self-pay | Admitting: Physical Medicine and Rehabilitation

## 2016-05-30 VITALS — BP 147/84 | HR 76

## 2016-05-30 DIAGNOSIS — M48061 Spinal stenosis, lumbar region without neurogenic claudication: Secondary | ICD-10-CM

## 2016-05-30 DIAGNOSIS — M5416 Radiculopathy, lumbar region: Secondary | ICD-10-CM

## 2016-05-30 DIAGNOSIS — G8929 Other chronic pain: Secondary | ICD-10-CM | POA: Diagnosis not present

## 2016-05-30 DIAGNOSIS — M5442 Lumbago with sciatica, left side: Secondary | ICD-10-CM | POA: Diagnosis not present

## 2016-05-30 NOTE — Progress Notes (Signed)
Megan [name] - 82 y.o. female MRN [id number]  Date of birth: [birth year]  Office Visit Note: Visit Date: 05/30/2016 PCP: Anda Kraft, MD Referred by: Anda Kraft, MD  Subjective: Chief Complaint  Patient presents with  . Lower Back - Pain  . Spine - Pain   HPI: Megan [name] is a very pleasant 82 year old female with history of prior left total shoulder replacement and now with right shoulder pain and severe arthritis about to undergo surgery Dr. Marlou Sa. She's been followed by Dr. Marlou Sa for some time for her shoulder pain and had recently been talking to him about her back pain. Many years ago she was followed by Dr. Sharol Given and she was having back pain at that time and had epidural injections with a series of 3 at [facility]. At least for my records as far back as 2006 she had initial injections through [facility] imaging which have all been L5-S1 intralaminar injections and one transforaminal injection. Unfortunately are not available to see the exact MRI findings but there was an MRI done at the time showing stenosis at both L3-4 and L4-5. Today she comes in reporting chronic worsening lower back pain mostly left and left buttock. She reports to me that she's been told she's had deteriorating disc for years. She feels like the last time she had an injection was about 5 years ago and this does seem to correspond well we can see on the electronic record. The last injection seemed to be about 2009. She doesn't report any numbness or tingling down the legs. She gets worsening pain at night if lying on the left side or moving around a lot. The pain is more posterior buttock and not greater trochanter. She denies any symptoms down the right leg. She's not had any focal weakness or bowel or bladder difficulty or fevers chills or night sweats. No specific trauma. The pain in the back and buttock is really more problematic than the back itself. She's been taking medications without relief. She has not had  recent physical therapy.    Review of Systems  Musculoskeletal: Positive for back pain and joint pain.   Otherwise per HPI.  Assessment & Plan: Visit Diagnoses:  1. Chronic left-sided low back pain with left-sided sciatica   2. Spinal stenosis of lumbar region without neurogenic claudication   3. Lumbar radiculopathy     Plan: Findings:  Chronic worsening severe at times left low back and buttock pain more than right but somewhat bilateral low back pain. No frank radicular symptoms past the knee or paresthesias. I do feel like this could be a symptom of stenosis. It may be worsening. The day and then she does feel at night. It does seem to be more of a nerve related throbbing pain. I think the best approach is a diagnostic and hopefully therapeutic left L5-S1 intralaminar epidural injection. That hopefully will help her through potential shoulder surgery that she's been a half soon. After she recovers from that depending on the results with the injection it would be probably time to look at MRI of her lower back especially in the setting of prior history of skin cancer.. If she was doing well obviously we just wait and see how she does. Otherwise when she is getting therapy for her shoulder which I'm sure will happen, Dr. Marlou Sa may want to include her lower back and that as well. She is on Plavix anticoagulation. We will try to get approval for her to be off that.  If for some reason she is not allowed to come off the Plavix we could potentially look at an L5 transforaminal injection. Her case is further complicated by diabetes and history of melanoma.  I spent more than 25 minutes speaking face-to-face with the patient with 50% of the time in counseling.    Meds & Orders: No orders of the defined types were placed in this encounter.  No orders of the defined types were placed in this encounter.   Follow-up: Return for Left L5-S1 interlaminar epidural steroid injection.   Procedures: No  procedures performed  No notes on file   Clinical History: No specialty comments available.  She reports that she has never smoked. She has never used smokeless tobacco. No results for input(s): HGBA1C, LABURIC in the last 8760 hours.  Objective:  VS:  HT:    WT:   BMI:     BP:(!) 147/84  HR:76bpm  TEMP: ( )  RESP:  Physical Exam  Constitutional: She is oriented to person, place, and time. She appears well-developed and well-nourished. No distress.  HENT:  Head: Normocephalic and atraumatic.  Nose: Nose normal.  Mouth/Throat: Oropharynx is clear and moist.  Eyes: Conjunctivae are normal. Pupils are equal, round, and reactive to light.  Neck: Normal range of motion. Neck supple.  Cardiovascular: Regular rhythm and intact distal pulses.   Pulmonary/Chest: Effort normal. No respiratory distress.  Abdominal: She exhibits no distension.  Musculoskeletal:  Patient ambulates without aid with a slightly forward flexed spine. She does have pain with extension of the lumbar spine. She has mild pain over the greater trochanters but not exquisite. It is worse on the left than right. She has no pain with hip rotation. She has good distal strength without clonus.  Neurological: She is alert and oriented to person, place, and time. She exhibits normal muscle tone. Coordination normal.  Skin: Skin is warm. No rash noted. No erythema.  Psychiatric: She has a normal mood and affect. Her behavior is normal.  Nursing note and vitals reviewed.   Ortho Exam Imaging: No results found.  Past Medical/Family/Surgical/Social History: Medications & Allergies reviewed per EMR Patient Active Problem List   Diagnosis Date Noted  . Arthritis of shoulder region, left, degenerative 07/14/2014   Past Medical History:  Diagnosis Date  . Arthritis   . Cancer ([place])    melanoma  . Diabetes mellitus without complication ([place])   . History of hiatal hernia   . Hypertension    History reviewed. No pertinent  family history. Past Surgical History:  Procedure Laterality Date  . APPENDECTOMY     82 yrs old  . CHOLECYSTECTOMY  95  . JOINT REPLACEMENT Right    knee   . REVERSE SHOULDER ARTHROPLASTY Left 07/14/2014  . REVERSE SHOULDER ARTHROPLASTY Left 07/14/2014   Procedure: LEFT REVERSE SHOULDER ARTHROPLASTY;  Surgeon: Meredith Pel, MD;  Location: [facility];  Service: Orthopedics;  Laterality: Left;  . SHOULDER ARTHROSCOPY Left    Social History   Occupational History  . Not on file.   Social History Main Topics  . Smoking status: Never Smoker  . Smokeless tobacco: Never Used  . Alcohol use No  . Drug use: No  . Sexual activity: Not on file

## 2016-05-30 NOTE — Progress Notes (Deleted)
Lower back pain mostly on left and in left buttock. Said she has had a deteriorating disc for years and had series of three injections- last time 5 years ago at [facility]. No numbness or tingling. Pain increases at night if lying on left side or if moving around a lot during the day.

## 2016-06-03 DIAGNOSIS — I10 Essential (primary) hypertension: Secondary | ICD-10-CM | POA: Diagnosis not present

## 2016-06-03 DIAGNOSIS — E118 Type 2 diabetes mellitus with unspecified complications: Secondary | ICD-10-CM | POA: Diagnosis not present

## 2016-06-04 ENCOUNTER — Telehealth (INDEPENDENT_AMBULATORY_CARE_PROVIDER_SITE_OTHER): Payer: Self-pay | Admitting: Orthopedic Surgery

## 2016-06-04 ENCOUNTER — Encounter (INDEPENDENT_AMBULATORY_CARE_PROVIDER_SITE_OTHER): Payer: Self-pay | Admitting: Physical Medicine and Rehabilitation

## 2016-06-04 NOTE — Telephone Encounter (Signed)
LVM with pt to call to schedule surgery. Will try to call pt again at a later time.

## 2016-06-05 ENCOUNTER — Ambulatory Visit (INDEPENDENT_AMBULATORY_CARE_PROVIDER_SITE_OTHER): Payer: Medicare Other | Admitting: Neurology

## 2016-06-05 ENCOUNTER — Encounter: Payer: Self-pay | Admitting: Neurology

## 2016-06-05 VITALS — BP 149/72 | HR 82 | Resp 20 | Ht 63.0 in | Wt 206.0 lb

## 2016-06-05 DIAGNOSIS — R011 Cardiac murmur, unspecified: Secondary | ICD-10-CM | POA: Diagnosis not present

## 2016-06-05 DIAGNOSIS — R42 Dizziness and giddiness: Secondary | ICD-10-CM | POA: Diagnosis not present

## 2016-06-05 DIAGNOSIS — R2689 Other abnormalities of gait and mobility: Secondary | ICD-10-CM

## 2016-06-05 NOTE — Progress Notes (Signed)
[facility]  [name] DOB: 03-25-1934  REFERRING DOCTOR OR PCP:  Dr. Wilson Singer SOURCE: patient, notes from Dr. Wilson Singer, lab reports  _________________________________   HISTORICAL  CHIEF COMPLAINT:  Chief Complaint  Patient presents with  . Dizziness    Megan [name] is here for eval of 2 day episode of dizziness 2 mos. ago.  Although dizziness resolved, she c/o coninued feeling of lightdeadedness.  Neg. w/u for stroke, but sts. pcp started her on Plavix just in case. She is currenlty off of her Plavix to prepare for esi she is having at [facility] next wk.   Has been eval by ENT for same.  Orthostatic VS done today/fim    HISTORY OF PRESENT ILLNESS:  I had the pleasure seeing you patient, Leilanny helped, at [facility] for neurologic consultation regarding her dizziness. She is a 82 year old woman who experienced a 2 day episode of moderate vertigo without a spinning sensation combined with lightheadedness in December 2017.   She fell once while turning around and felt she was going to fall many more times.  A head CT 12/2015 did not show any evidence of stroke and there was just mild chronic microvascular ischemic change. The worst of her symptoms resolved after a couple days but she has continued to have feelings of lightheadedness that occur when she is standing up. In general, she does not have any symptoms when she is laying down or sitting.   During the worst of the 2 days she felt very unsteady and had 1 fall. More recently, she feels a little unsteady when she is walking but she does not veer to either the left with the right. No actual vertigo.   She saw Dr. Janace Hoard of ENT and no source of her symptoms were noted.   However, she did have mild to moderate sensorineural hearing loss on the left and right. Due to her symptoms and for stroke prophylaxis, Plavix was added. Will be helpful she has an epidural steroid injection.    Since her fall in  December, she has only one other fall associated with tripping.    She denies any numbness or weakness in the arms or legs. She has some urinary frequency helped by medication. This is chronic. She does have a lot of arthritic issues and has had a left shoulder replacement and will be having a right shoulder replacement later this year. She also has degenerative changes in her lumbar spine.  She denies chest pain or shortness of breath.   She has mild fatigue.   REVIEW OF SYSTEMS: Constitutional: No fevers, chills, sweats, or change in appetite.   Mild fatigue Eyes: No visual changes, double vision, eye pain Ear, nose and throat: No hearing loss, ear pain, nasal congestion, sore throat Cardiovascular: No chest pain, palpitations Respiratory: No shortness of breath at rest or with exertion.   No wheezes GastrointestinaI: No nausea, vomiting, diarrhea, abdominal pain, fecal incontinence Genitourinary: No dysuria, urinary retention or frequency.  No nocturia. Musculoskeletal: Shoulder and back pain.  Integumentary: No rash, pruritus, skin lesions Neurological: as above Psychiatric: No depression at this time.  No anxiety Endocrine: No palpitations, diaphoresis, change in appetite, change in weigh or increased thirst Hematologic/Lymphatic: No anemia, purpura, petechiae. Allergic/Immunologic: No itchy/runny eyes, nasal congestion, recent allergic reactions, rashes  ALLERGIES: No Known Allergies  HOME MEDICATIONS:  Current Outpatient Prescriptions:  .  ACCU-CHEK AVIVA PLUS test strip, , Disp: , Rfl:  .  ACCU-CHEK SOFTCLIX LANCETS lancets, ,  Disp: , Rfl:  .  amLODipine-valsartan (EXFORGE) 5-160 MG per tablet, Take 1 tablet by mouth daily. , Disp: , Rfl: 12 .  aspirin EC 325 MG EC tablet, Take 1 tablet (325 mg total) by mouth daily., Disp: 30 tablet, Rfl: 0 .  calcium carbonate (OS-CAL - DOSED IN MG OF ELEMENTAL CALCIUM) 1250 (500 CA) MG tablet, Take 1 tablet by mouth daily with  breakfast., Disp: , Rfl:  .  clopidogrel (PLAVIX) 75 MG tablet, , Disp: , Rfl:  .  glimepiride (AMARYL) 2 MG tablet, Take 2 mg by mouth daily with breakfast. , Disp: , Rfl: 12 .  ketotifen (ZADITOR) 0.025 % ophthalmic solution, Place 1 drop into both eyes 1 day or 1 dose., Disp: , Rfl:  .  Multiple Vitamin (MULTIVITAMIN) tablet, Take 1 tablet by mouth daily., Disp: , Rfl:  .  MYRBETRIQ 50 MG TB24 tablet, Take 50 mg by mouth daily., Disp: , Rfl: 11 .  Omega-3 Fatty Acids (FISH OIL) 1000 MG CAPS, Take 1,000 mg by mouth daily., Disp: , Rfl:  .  ranitidine (ZANTAC) 150 MG capsule, Take 150 mg by mouth 2 (two) times daily., Disp: , Rfl: 12 .  ranitidine (ZANTAC) 150 MG tablet, , Disp: , Rfl:  .  sitaGLIPtin (JANUVIA) 100 MG tablet, Take 100 mg by mouth daily., Disp: , Rfl:  .  SYNTHROID 125 MCG tablet, Take 125 mcg by mouth daily., Disp: , Rfl: 12 .  triamterene-hydrochlorothiazide (DYAZIDE) 37.5-25 MG per capsule, Take 1 capsule by mouth daily as needed. , Disp: , Rfl: 11 .  JARDIANCE 25 MG TABS tablet, , Disp: , Rfl:   PAST MEDICAL HISTORY: Past Medical History:  Diagnosis Date  . Arthritis   . Cancer ([place])    melanoma  . Diabetes mellitus without complication ([place])   . History of hiatal hernia   . Hypertension     PAST SURGICAL HISTORY: Past Surgical History:  Procedure Laterality Date  . APPENDECTOMY     82 yrs old  . CHOLECYSTECTOMY  95  . JOINT REPLACEMENT Right    knee   . REVERSE SHOULDER ARTHROPLASTY Left 07/14/2014  . REVERSE SHOULDER ARTHROPLASTY Left 07/14/2014   Procedure: LEFT REVERSE SHOULDER ARTHROPLASTY;  Surgeon: Meredith Pel, MD;  Location: [place];  Service: Orthopedics;  Laterality: Left;  . SHOULDER ARTHROSCOPY Left     FAMILY HISTORY: No family history on file.  SOCIAL HISTORY:  Social History   Social History  . Marital status: Widowed    Spouse name: N/A  . Number of children: N/A  . Years of education: N/A   Occupational History  . Not on  file.   Social History Main Topics  . Smoking status: Never Smoker  . Smokeless tobacco: Never Used  . Alcohol use No  . Drug use: No  . Sexual activity: Not on file   Other Topics Concern  . Not on file   Social History Narrative  . No narrative on file     PHYSICAL EXAM  Vitals:   06/05/16 1014  BP: (!) 149/72  Pulse: 82  Resp: 20  Weight: 206 lb (93.4 kg)  Height: 5\' 3"  (1.6 m)   Orthostatic VS for the past 24 hrs:  BP- Lying Pulse- Lying BP- Sitting Pulse- Sitting BP- Standing at 0 minutes Pulse- Standing at 0 minutes  06/05/16 1015 149/72 82 143/85 85 152/76 86     Body mass index is 36.49 kg/m.   General: The patient is well-developed  and well-nourished and in no acute distress  HEENT:  Head is normocephalic and atraumatic. Tympanic membranes are intact.  Funduscopic exam shows normal optic discs and retinal vessels.   The pharynx is nonerythematous.  Neck: The neck is supple, no carotid bruits are noted.  The neck is nontender.  Cardiovascular: The heart has a regular rate and rhythm with a normal S1 and S2. She had a systolic murmur.. Lungs are clear to auscultation.  Skin: Extremities are without significant edema.  Musculoskeletal:  Back is nontender  Neurologic Exam  Mental status: The patient is alert and oriented x 3 at the time of the examination. The patient has apparent normal recent and remote memory, with an apparently normal attention span and concentration ability.   Speech is normal.  Cranial nerves: Extraocular movements are full. Pupils are equal, round, and reactive to light and accomodation. There is good facial sensation to soft touch bilaterally.Facial strength is normal.  Trapezius and sternocleidomastoid strength is normal. No dysarthria is noted.  The tongue is midline, and the patient has symmetric elevation of the soft palate. No obvious hearing deficits are noted.  Motor:  Muscle bulk is normal.   Tone is normal. Strength is  5 /  5 in all 4 extremities.   Sensory: Sensory testing is intact to pinprick, soft touch and vibration sensation in all 4 extremities except for mild reduced vibratory sensation at the toes.  Coordination: Cerebellar testing reveals good finger-nose-finger and heel-to-shin bilaterally.  Gait and station: Station is normal.   Gait is mildly arthritic. Tandem gait is wide. Romberg is negative.   Reflexes: Deep tendon reflexes are symmetric and normal bilaterally.   Plantar responses are flexor.    DIAGNOSTIC DATA (LABS, IMAGING, TESTING) - I reviewed patient records, labs, notes, testing and imaging myself where available.  Lab Results  Component Value Date   WBC 7.5 07/06/2014   HGB 13.9 07/06/2014   HCT 41.3 07/06/2014   MCV 88.8 07/06/2014   PLT 305 07/06/2014      Component Value Date/Time   NA 139 07/06/2014 0944   K 3.8 07/06/2014 0944   CL 105 07/06/2014 0944   CO2 23 07/06/2014 0944   GLUCOSE 174 (H) 07/06/2014 0944   BUN 17 07/06/2014 0944   CREATININE 0.85 07/06/2014 0944   CALCIUM 9.2 07/06/2014 0944   GFRNONAA >60 07/06/2014 0944   GFRAA >60 07/06/2014 0944       ASSESSMENT AND PLAN  Lightheaded - Plan: ECHOCARDIOGRAM COMPLETE  Poor balance  Systolic murmur - Plan: ECHOCARDIOGRAM COMPLETE   In summary, Mrs. Nickelson is a 82 year old woman with lightheadedness for the past 6 months or so with occasional episodes with more severe dizziness. The etiology is not clear. Her neurologic examination was fairly normal for age just showing an arthritic gait and a mildly wide tandem walk.  Her mild reduced vibratory sensation at the toes would be unlikely to be related to her symptoms.. However, she also had a systolic murmur. We need to check an echocardiogram to make sure that she does not have a valvular issue explaining her symptoms. She would prefer not to do an MRI at this point. However, if her symptoms worsen and an alternative diagnosis is not discovered we will  check an MRI of the brain and an MR angiogram of the intracranial arteries.  She will return to see me in 3 or 4 months but call sooner if she has new or worsening neurologic symptoms.  Thank you for  asking me to see Mrs. Sevin for a neurologic consultation. Please let me know if I can be of further assistance with her or other patients in the future.  Jayshawn Colston A. Felecia Shelling, MD, PhD 03/30/2023, 42:70 AM Certified in Neurology, Clinical Neurophysiology, Sleep Medicine, Pain Medicine and Neuroimaging  [facility] [address] [place], [place] [address] [phone]

## 2016-06-06 DIAGNOSIS — E032 Hypothyroidism due to medicaments and other exogenous substances: Secondary | ICD-10-CM | POA: Diagnosis not present

## 2016-06-06 DIAGNOSIS — E118 Type 2 diabetes mellitus with unspecified complications: Secondary | ICD-10-CM | POA: Diagnosis not present

## 2016-06-10 ENCOUNTER — Ambulatory Visit (INDEPENDENT_AMBULATORY_CARE_PROVIDER_SITE_OTHER): Payer: Medicare Other | Admitting: Physical Medicine and Rehabilitation

## 2016-06-10 ENCOUNTER — Ambulatory Visit (INDEPENDENT_AMBULATORY_CARE_PROVIDER_SITE_OTHER): Payer: Medicare Other

## 2016-06-10 VITALS — BP 149/77 | HR 78 | Temp 97.9°F

## 2016-06-10 DIAGNOSIS — M5416 Radiculopathy, lumbar region: Secondary | ICD-10-CM | POA: Diagnosis not present

## 2016-06-10 MED ORDER — LIDOCAINE HCL (PF) 1 % IJ SOLN
2.0000 mL | Freq: Once | INTRAMUSCULAR | Status: AC
  Administered 2016-06-10: 2 mL

## 2016-06-10 MED ORDER — METHYLPREDNISOLONE ACETATE 80 MG/ML IJ SUSP
80.0000 mg | Freq: Once | INTRAMUSCULAR | Status: AC
  Administered 2016-06-10: 80 mg

## 2016-06-10 NOTE — Patient Instructions (Signed)

## 2016-06-10 NOTE — Progress Notes (Deleted)
Patient is here today for left L5-S1 interlaminar injection. No change in symptoms.  D/c'd Plavix on 06/05/16

## 2016-06-10 NOTE — Progress Notes (Signed)
Megan [name] - 82 y.o. female MRN [id number]  Date of birth: August 27, 1934  Office Visit Note: Visit Date: 06/10/2016 PCP: Anda Kraft, MD Referred by: Anda Kraft, MD  Subjective: No chief complaint on file.  HPI: [name] is a 82 year old female that I recently saw evaluation and management. We did get permission to take her off Plavix and she has been off Plavix now for 5 days. She'll return taking that tonight. We are point to perform a planned left L5-S1 intralaminar epidural steroid injection. Please see our prior evaluation and management note for further details and justification.    ROS Otherwise per HPI.  Assessment & Plan: Visit Diagnoses:  1. Lumbar radiculopathy     Plan: Findings:  Left L5-S1 interlaminar epidural steroid injection.    Meds & Orders:  Meds ordered this encounter  Medications  . lidocaine (PF) (XYLOCAINE) 1 % injection 2 mL  . methylPREDNISolone acetate (DEPO-MEDROL) injection 80 mg    Orders Placed This Encounter  Procedures  . XR C-ARM NO REPORT  . Epidural Steroid injection    Follow-up: Return if symptoms worsen or fail to improve.   Procedures: No procedures performed  Lumbar Epidural Steroid Injection - Interlaminar Approach with Fluoroscopic Guidance  [name]      Date of Birth: [birth year] MRN: [id number] PCP: Anda Kraft, MD      Visit Date: 06/10/2016   Universal Protocol:    Date/Time: 05/15/185:30 AM  Consent Given By: the patient  Position: PRONE  Additional Comments: Vital signs were monitored before and after the procedure. Patient was prepped and draped in the usual sterile fashion. The correct patient, procedure, and site was verified.   Injection Procedure Details:  Procedure Site One Meds Administered:  Meds ordered this encounter  Medications  . lidocaine (PF) (XYLOCAINE) 1 % injection 2 mL  . methylPREDNISolone acetate (DEPO-MEDROL) injection 80 mg     Laterality:  Left  Location/Site:  L5-S1  Needle size: 20 G  Needle type: Tuohy  Needle Placement: Paramedian epidural  Findings:  -Contrast Used: 1 mL iohexol 180 mg iodine/mL   -Comments: Excellent flow of contrast into the epidural space.  Procedure Details: Using a paramedian approach from the side mentioned above, the region overlying the inferior lamina was localized under fluoroscopic visualization and the soft tissues overlying this structure were infiltrated with 4 ml. of 1% Lidocaine without Epinephrine. The Tuohy needle was inserted into the epidural space using a paramedian approach.   The epidural space was localized using loss of resistance along with lateral and bi-planar fluoroscopic views.  After negative aspirate for air, blood, and CSF, a 2 ml. volume of Isovue-250 was injected into the epidural space and the flow of contrast was observed. Radiographs were obtained for documentation purposes.    The injectate was administered into the level noted above.   Additional Comments:  No complications occurred Dressing: Band-Aid    Post-procedure details: Patient was observed during the procedure. Post-procedure instructions were reviewed.  Patient left the clinic in stable condition.    Clinical History: No specialty comments available.  She reports that she has never smoked. She has never used smokeless tobacco. No results for input(s): HGBA1C, LABURIC in the last 8760 hours.  Objective:  VS:  HT:    WT:   BMI:     BP:(!) 149/77  HR:78bpm  TEMP:97.9 F (36.6 C)(Oral)  RESP:97 % Physical Exam  Musculoskeletal:  Patient ambulates without aid with good distal strength.  Ortho Exam Imaging: Xr C-arm No Report  Result Date: 06/10/2016 Please see Notes or Procedures tab for imaging impression.   Past Medical/Family/Surgical/Social History: Medications & Allergies reviewed per EMR Patient Active Problem List   Diagnosis Date Noted  . Lightheaded 06/05/2016  .  Poor balance 06/05/2016  . Systolic murmur 24/46/9507  . Arthritis of shoulder region, left, degenerative 07/14/2014   Past Medical History:  Diagnosis Date  . Arthritis   . Cancer ([place])    melanoma  . Diabetes mellitus without complication ([place])   . History of hiatal hernia   . Hypertension    No family history on file. Past Surgical History:  Procedure Laterality Date  . APPENDECTOMY     82 yrs old  . CHOLECYSTECTOMY  95  . JOINT REPLACEMENT Right    knee   . REVERSE SHOULDER ARTHROPLASTY Left 07/14/2014  . REVERSE SHOULDER ARTHROPLASTY Left 07/14/2014   Procedure: LEFT REVERSE SHOULDER ARTHROPLASTY;  Surgeon: Meredith Pel, MD;  Location: [place];  Service: Orthopedics;  Laterality: Left;  . SHOULDER ARTHROSCOPY Left    Social History   Occupational History  . Not on file.   Social History Main Topics  . Smoking status: Never Smoker  . Smokeless tobacco: Never Used  . Alcohol use No  . Drug use: No  . Sexual activity: Not on file

## 2016-06-11 NOTE — Procedures (Signed)
Lumbar Epidural Steroid Injection - Interlaminar Approach with Fluoroscopic Guidance  [name]      Date of Birth: 21-Sep-1934 MRN: [id number] PCP: Anda Kraft, MD      Visit Date: 06/10/2016   Universal Protocol:    Date/Time: 05/15/185:30 AM  Consent Given By: the patient  Position: PRONE  Additional Comments: Vital signs were monitored before and after the procedure. Patient was prepped and draped in the usual sterile fashion. The correct patient, procedure, and site was verified.   Injection Procedure Details:  Procedure Site One Meds Administered:  Meds ordered this encounter  Medications  . lidocaine (PF) (XYLOCAINE) 1 % injection 2 mL  . methylPREDNISolone acetate (DEPO-MEDROL) injection 80 mg     Laterality: Left  Location/Site:  L5-S1  Needle size: 20 G  Needle type: Tuohy  Needle Placement: Paramedian epidural  Findings:  -Contrast Used: 1 mL iohexol 180 mg iodine/mL   -Comments: Excellent flow of contrast into the epidural space.  Procedure Details: Using a paramedian approach from the side mentioned above, the region overlying the inferior lamina was localized under fluoroscopic visualization and the soft tissues overlying this structure were infiltrated with 4 ml. of 1% Lidocaine without Epinephrine. The Tuohy needle was inserted into the epidural space using a paramedian approach.   The epidural space was localized using loss of resistance along with lateral and bi-planar fluoroscopic views.  After negative aspirate for air, blood, and CSF, a 2 ml. volume of Isovue-250 was injected into the epidural space and the flow of contrast was observed. Radiographs were obtained for documentation purposes.    The injectate was administered into the level noted above.   Additional Comments:  No complications occurred Dressing: Band-Aid    Post-procedure details: Patient was observed during the procedure. Post-procedure instructions were  reviewed.  Patient left the clinic in stable condition.

## 2016-06-20 ENCOUNTER — Ambulatory Visit (HOSPITAL_COMMUNITY): Payer: Medicare Other | Attending: Cardiovascular Disease

## 2016-06-20 DIAGNOSIS — R011 Cardiac murmur, unspecified: Secondary | ICD-10-CM | POA: Insufficient documentation

## 2016-06-20 DIAGNOSIS — I119 Hypertensive heart disease without heart failure: Secondary | ICD-10-CM | POA: Insufficient documentation

## 2016-06-20 DIAGNOSIS — E119 Type 2 diabetes mellitus without complications: Secondary | ICD-10-CM | POA: Insufficient documentation

## 2016-06-20 DIAGNOSIS — R42 Dizziness and giddiness: Secondary | ICD-10-CM | POA: Diagnosis not present

## 2016-06-20 DIAGNOSIS — I080 Rheumatic disorders of both mitral and aortic valves: Secondary | ICD-10-CM | POA: Diagnosis not present

## 2016-06-21 ENCOUNTER — Other Ambulatory Visit: Payer: Self-pay | Admitting: Neurology

## 2016-06-21 DIAGNOSIS — I350 Nonrheumatic aortic (valve) stenosis: Secondary | ICD-10-CM | POA: Insufficient documentation

## 2016-06-21 DIAGNOSIS — R42 Dizziness and giddiness: Secondary | ICD-10-CM

## 2016-06-25 ENCOUNTER — Telehealth: Payer: Self-pay | Admitting: *Deleted

## 2016-06-25 NOTE — Telephone Encounter (Signed)
LMOM that per RAS, echo did show a slight problem with her aortic valve that may be contributing to her lightheadedness.  Cardiology referral has been ordered to investigate this further/fim

## 2016-06-25 NOTE — Telephone Encounter (Signed)
-----   Message from Britt Bottom, MD sent at 06/21/2016  1:03 PM EDT ----- Please let her know that the echocardiogram did show that the aortic valve was moderately stenotic which could possibly have some relationship to her lightheadedness.     We need to have her do a cardiology consultation. I will put the order in

## 2016-07-02 ENCOUNTER — Encounter: Payer: Self-pay | Admitting: Cardiovascular Disease

## 2016-07-03 DIAGNOSIS — E118 Type 2 diabetes mellitus with unspecified complications: Secondary | ICD-10-CM | POA: Diagnosis not present

## 2016-07-03 DIAGNOSIS — I10 Essential (primary) hypertension: Secondary | ICD-10-CM | POA: Diagnosis not present

## 2016-07-04 DIAGNOSIS — H43812 Vitreous degeneration, left eye: Secondary | ICD-10-CM | POA: Diagnosis not present

## 2016-07-04 DIAGNOSIS — H43392 Other vitreous opacities, left eye: Secondary | ICD-10-CM | POA: Diagnosis not present

## 2016-07-10 DIAGNOSIS — E118 Type 2 diabetes mellitus with unspecified complications: Secondary | ICD-10-CM | POA: Diagnosis not present

## 2016-07-10 DIAGNOSIS — N3281 Overactive bladder: Secondary | ICD-10-CM | POA: Diagnosis not present

## 2016-07-16 NOTE — Progress Notes (Signed)
Cardiology Office Note   Date:  07/18/2016   ID:  Megan [name], DOB Jun 23, 1934, MRN [id number]  PCP:  Anda Kraft, MD  Cardiologist:   Jenkins Rouge, MD   No chief complaint on file.     History of Present Illness: Megan [name] is a 82 y.o. female who presents for consultation regarding aortic stenosis . Referred by Dr Wilson Singer and  Neurologist Dr Felecia Shelling   CRF;s include DM and HTN.  She has had some dizziness. Seen by [facility] neurology 06/05/16 Has had over last 2 months Negative w/u for stroke started on plavix. December 2017 has 2 day episode of moderate vertigo with lightheadeness. She has imbalance and feels like she is going to fall at times when she turns around Symptoms worse With standing Seen by Dr Janace Hoard ENT with no source for symptoms. She does have sensorineural hearing loss bilaterally. Lots of ortho issues with left shoulder replacement and recent lumbar steroid injection. Systolic murmur noted on exam  Reviewed echo done 06/20/16 EF normal mild LVH Moderate AS and mild AR mean gradient 31 mmHg peak 64 mmHg  Mild to moderate MR Estimated PA 33 mmHg   She has not had chest pain dyspnea or syncope mild palpitations. Had left shoulder surgery 2 years ago With no issues Dr Marlou Sa to do right shoulder in July.   Past Medical History:  Diagnosis Date  . Arthritis   . Cancer ([place])    melanoma  . Diabetes mellitus without complication ([place])   . History of hiatal hernia   . Hypertension     Past Surgical History:  Procedure Laterality Date  . APPENDECTOMY     82 yrs old  . CHOLECYSTECTOMY  95  . JOINT REPLACEMENT Right    knee   . REVERSE SHOULDER ARTHROPLASTY Left 07/14/2014  . REVERSE SHOULDER ARTHROPLASTY Left 07/14/2014   Procedure: LEFT REVERSE SHOULDER ARTHROPLASTY;  Surgeon: Meredith Pel, MD;  Location: [place];  Service: Orthopedics;  Laterality: Left;  . SHOULDER ARTHROSCOPY Left      Current Outpatient Prescriptions  Medication Sig Dispense Refill   . ACCU-CHEK AVIVA PLUS test strip     . ACCU-CHEK SOFTCLIX LANCETS lancets     . amLODipine-valsartan (EXFORGE) 5-160 MG per tablet Take 1 tablet by mouth daily.   12  . aspirin EC 325 MG EC tablet Take 1 tablet (325 mg total) by mouth daily. 30 tablet 0  . calcium carbonate (OS-CAL - DOSED IN MG OF ELEMENTAL CALCIUM) 1250 (500 CA) MG tablet Take 1 tablet by mouth daily with breakfast.    . clopidogrel (PLAVIX) 75 MG tablet     . glimepiride (AMARYL) 2 MG tablet Take 2 mg by mouth daily with breakfast.   12  . ketotifen (ZADITOR) 0.025 % ophthalmic solution Place 1 drop into both eyes 1 day or 1 dose.    . Multiple Vitamin (MULTIVITAMIN) tablet Take 1 tablet by mouth daily.    Marland Kitchen MYRBETRIQ 50 MG TB24 tablet Take 50 mg by mouth daily.  11  . Omega-3 Fatty Acids (FISH OIL) 1000 MG CAPS Take 1,000 mg by mouth daily.    . ranitidine (ZANTAC) 150 MG tablet     . sitaGLIPtin (JANUVIA) 100 MG tablet Take 100 mg by mouth daily.    Marland Kitchen SYNTHROID 125 MCG tablet Take 125 mcg by mouth daily.  12  . triamterene-hydrochlorothiazide (DYAZIDE) 37.5-25 MG per capsule Take 1 capsule by mouth daily as needed.   [facility]  No current facility-administered medications for this visit.     Allergies:   Patient has no known allergies.    Social History:  The patient  reports that she has never smoked. She has never used smokeless tobacco. She reports that she does not drink alcohol or use drugs.   Family History:  The patient's family history is not on file.    ROS:  Please see the history of present illness.   Otherwise, review of systems are positive for none.   All other systems are reviewed and negative.    PHYSICAL EXAM: VS:  BP (!) 132/58 (BP Location: Left Arm)   Pulse 87   Ht 5\' 4"  (1.626 m)   Wt 97.9 kg (215 lb 12.8 oz)   LMP  (LMP Unknown)   BMI 37.04 kg/m  , BMI Body mass index is 37.04 kg/m. Affect appropriate Healthy:  appears stated age 89: normal Neck supple with no adenopathy JVP  normal no bruits no thyromegaly Lungs clear with no wheezing and good diaphragmatic motion Heart:  S1/S2 preserved AS murmur, no rub, gallop or click PMI normal Abdomen: benighn, BS positve, no tenderness, no AAA no bruit.  No HSM or HJR Distal pulses intact with no bruits No edema Neuro non-focal Skin warm and dry No muscular weakness    EKG:  07/14/14 SR RBBB  07/18/16  NSR rate 87 RBBB    Recent Labs: No results found for requested labs within last 8760 hours.    Lipid Panel No results found for: CHOL, TRIG, HDL, CHOLHDL, VLDL, LDLCALC, LDLDIRECT    Wt Readings from Last 3 Encounters:  07/18/16 97.9 kg (215 lb 12.8 oz)  06/05/16 93.4 kg (206 lb)  07/14/14 93.4 kg (206 lb)      Other studies Reviewed: Additional studies/ records that were reviewed today include: Notes from primary and neurology as well As CT, echo labs and ENT notes Dr Janace Hoard. .    ASSESSMENT AND PLAN:  1. Aortic Stenosis  Moderate f/u echo in 6 months at some point may be a TAVR candidate given age 76. Dizziness non cardiac not postural in office follow 3. HTN  Well controlled.  Continue current medications and low sodium Dash type diet.   4. Pre op:  Given age and AS would do lexiscan myovue to clear for general anesthesia in July 5 DM Discussed low carb diet.  Target hemoglobin A1c is 6.5 or less.  Continue current medications. 6. RBBB chronic no high grade AV block yearly ECG    Current medicines are reviewed at length with the patient today.  The patient does not have concerns regarding medicines.  The following changes have been made:  no change  Labs/ tests ordered today include: Echo in December   Orders Placed This Encounter  Procedures  . Myocardial Perfusion Imaging  . EKG 12-Lead  . ECHOCARDIOGRAM COMPLETE     Disposition:   FU with me in 6 months with echo      Signed, Jenkins Rouge, MD  07/18/2016 1:26 PM    [facility] [place],  [place], [place]  [address] Phone: [phone]; Fax: [phone]

## 2016-07-18 ENCOUNTER — Encounter (INDEPENDENT_AMBULATORY_CARE_PROVIDER_SITE_OTHER): Payer: Self-pay

## 2016-07-18 ENCOUNTER — Encounter: Payer: Self-pay | Admitting: Cardiovascular Disease

## 2016-07-18 ENCOUNTER — Ambulatory Visit (INDEPENDENT_AMBULATORY_CARE_PROVIDER_SITE_OTHER): Payer: Medicare Other | Admitting: Cardiovascular Disease

## 2016-07-18 VITALS — BP 132/58 | HR 87 | Ht 64.0 in | Wt 215.8 lb

## 2016-07-18 DIAGNOSIS — I350 Nonrheumatic aortic (valve) stenosis: Secondary | ICD-10-CM

## 2016-07-18 DIAGNOSIS — Z01818 Encounter for other preprocedural examination: Secondary | ICD-10-CM | POA: Diagnosis not present

## 2016-07-18 NOTE — Patient Instructions (Signed)
Medication Instructions:  Your physician recommends that you continue on your current medications as directed. Please refer to the Current Medication list given to you today.  Labwork: NONE  Testing/Procedures: Your physician has requested that you have a lexiscan myoview. For further information please visit [email or web address]. Please follow instruction sheet, as given.  Your physician has requested that you have an echocardiogram with office visit in 6 months. Echocardiography is a painless test that uses sound waves to create images of your heart. It provides your doctor with information about the size and shape of your heart and how well your heart's chambers and valves are working. This procedure takes approximately one hour. There are no restrictions for this procedure.  Follow-Up: Your physician wants you to follow-up in: 6 months with Dr. Johnsie Cancel. You will receive a reminder letter in the mail two months in advance. If you don't receive a letter, please call our office to schedule the follow-up appointment.   If you need a refill on your cardiac medications before your next appointment, please call your pharmacy.

## 2016-07-22 ENCOUNTER — Ambulatory Visit (HOSPITAL_COMMUNITY): Payer: Medicare Other | Attending: Internal Medicine

## 2016-07-22 DIAGNOSIS — I10 Essential (primary) hypertension: Secondary | ICD-10-CM | POA: Diagnosis not present

## 2016-07-22 DIAGNOSIS — I4510 Unspecified right bundle-branch block: Secondary | ICD-10-CM | POA: Diagnosis not present

## 2016-07-22 DIAGNOSIS — I350 Nonrheumatic aortic (valve) stenosis: Secondary | ICD-10-CM | POA: Insufficient documentation

## 2016-07-22 DIAGNOSIS — Z01818 Encounter for other preprocedural examination: Secondary | ICD-10-CM

## 2016-07-22 DIAGNOSIS — E119 Type 2 diabetes mellitus without complications: Secondary | ICD-10-CM | POA: Diagnosis not present

## 2016-07-22 MED ORDER — TECHNETIUM TC 99M TETROFOSMIN IV KIT
32.8000 | PACK | Freq: Once | INTRAVENOUS | Status: AC | PRN
  Administered 2016-07-22: 32.8 via INTRAVENOUS
  Filled 2016-07-22: qty 33

## 2016-07-22 MED ORDER — REGADENOSON 0.4 MG/5ML IV SOLN
0.4000 mg | Freq: Once | INTRAVENOUS | Status: AC
  Administered 2016-07-22: 0.4 mg via INTRAVENOUS

## 2016-07-23 ENCOUNTER — Ambulatory Visit (HOSPITAL_COMMUNITY): Payer: Medicare Other | Attending: Cardiology

## 2016-07-23 LAB — MYOCARDIAL PERFUSION IMAGING
CHL CUP NUCLEAR SDS: 4
CHL CUP RESTING HR STRESS: 68 {beats}/min
LV dias vol: 77 mL (ref 46–106)
LV sys vol: 20 mL
NUC STRESS TID: 0.99
Peak HR: 88 {beats}/min
RATE: 0.26
SRS: 0
SSS: 4

## 2016-07-23 MED ORDER — TECHNETIUM TC 99M TETROFOSMIN IV KIT
32.5000 | PACK | Freq: Once | INTRAVENOUS | Status: AC | PRN
  Administered 2016-07-23: 32.5 via INTRAVENOUS
  Filled 2016-07-23: qty 33

## 2016-07-25 ENCOUNTER — Other Ambulatory Visit (INDEPENDENT_AMBULATORY_CARE_PROVIDER_SITE_OTHER): Payer: Self-pay | Admitting: Orthopedic Surgery

## 2016-07-25 ENCOUNTER — Telehealth (INDEPENDENT_AMBULATORY_CARE_PROVIDER_SITE_OTHER): Payer: Self-pay | Admitting: Orthopedic Surgery

## 2016-07-25 DIAGNOSIS — M1611 Unilateral primary osteoarthritis, right hip: Secondary | ICD-10-CM

## 2016-07-25 NOTE — Telephone Encounter (Signed)
Pt called asking if she was ok to proceed with surgery? Stated they found she has a defective heart valve and Dr. Cecile Sheerer did a chemical stress test to make sure she had no blocked arteries or any other complications and was suppose to contact you to let you know if she was ok to proceed with surgery. CB#: [phone]

## 2016-07-26 ENCOUNTER — Telehealth: Payer: Self-pay | Admitting: Cardiovascular Disease

## 2016-07-26 NOTE — Pre-Procedure Instructions (Signed)
    Megan [name]  07/26/2016      [id number] [place], [place] [address] [facility] [place] [address] [place] [address] Phone: [phone] Fax: [phone]    Your procedure is scheduled on 08/06/16.  Report to [facility] Admitting at 730 A.M.  Call this number if you have problems the morning of surgery:  [phone]   Remember:  Do not eat food or drink liquids after midnight.  Take these medicines the morning of surgery with A SIP OF WATER --synthroid   Do not wear jewelry, make-up or nail polish.  Do not wear lotions, powders, or perfumes, or deoderant.  Do not shave 48 hours prior to surgery.  Men may shave face and neck.  Do not bring valuables to the hospital.  [facility] is not responsible for any belongings or valuables.  Contacts, dentures or bridgework may not be worn into surgery.  Leave your suitcase in the car.  After surgery it may be brought to your room.  For patients admitted to the hospital, discharge time will be determined by your treatment team.  Patients discharged the day of surgery will not be allowed to drive home.   Name and phone number of your driver:   Special instructions:  Do not take any aspirin,anti-inflammatories,vitamins,or herbal supplements 5-7 days prior to surgery.  Please read over the following fact sheets that you were given. MRSA Information

## 2016-07-26 NOTE — Telephone Encounter (Signed)
Notes recorded by Josue Hector, MD on 07/25/2016 at 12:09 PM EDT Normal myovue study with no evidence of ischemia or infarction  Left message for patient to call back.

## 2016-07-26 NOTE — Telephone Encounter (Signed)
Patient calling, states that she would like to know the results from stress test. Thanks.

## 2016-07-29 ENCOUNTER — Encounter (HOSPITAL_COMMUNITY)
Admission: RE | Admit: 2016-07-29 | Discharge: 2016-07-29 | Disposition: A | Discharge status: Home / Self Care | Payer: Medicare Other | Source: Ambulatory Visit | Attending: Orthopedic Surgery | Admitting: Orthopedic Surgery

## 2016-07-29 ENCOUNTER — Encounter (HOSPITAL_COMMUNITY): Payer: Self-pay

## 2016-07-29 DIAGNOSIS — M19011 Primary osteoarthritis, right shoulder: Secondary | ICD-10-CM | POA: Diagnosis not present

## 2016-07-29 DIAGNOSIS — D71 Functional disorders of polymorphonuclear neutrophils: Secondary | ICD-10-CM | POA: Insufficient documentation

## 2016-07-29 DIAGNOSIS — Z419 Encounter for procedure for purposes other than remedying health state, unspecified: Secondary | ICD-10-CM | POA: Insufficient documentation

## 2016-07-29 DIAGNOSIS — Z96611 Presence of right artificial shoulder joint: Secondary | ICD-10-CM | POA: Diagnosis not present

## 2016-07-29 DIAGNOSIS — Z471 Aftercare following joint replacement surgery: Secondary | ICD-10-CM | POA: Diagnosis not present

## 2016-07-29 DIAGNOSIS — Z01818 Encounter for other preprocedural examination: Secondary | ICD-10-CM | POA: Insufficient documentation

## 2016-07-29 DIAGNOSIS — I700 Atherosclerosis of aorta: Secondary | ICD-10-CM | POA: Insufficient documentation

## 2016-07-29 HISTORY — DX: Dizziness and giddiness: R42

## 2016-07-29 LAB — BASIC METABOLIC PANEL
Anion gap: 11 (ref 5–15)
BUN: 17 mg/dL (ref 6–20)
CHLORIDE: 105 mmol/L (ref 101–111)
CO2: 22 mmol/L (ref 22–32)
Calcium: 9.4 mg/dL (ref 8.9–10.3)
Creatinine, Ser: 0.87 mg/dL (ref 0.44–1.00)
GFR calc non Af Amer: >60 mL/min (ref >60)
Glucose, Bld: 133 mg/dL — ABNORMAL HIGH (ref 65–99)
Potassium: 3.8 mmol/L (ref 3.5–5.1)
Sodium: 138 mmol/L (ref 135–145)

## 2016-07-29 LAB — URINALYSIS, ROUTINE W REFLEX MICROSCOPIC
Bilirubin Urine: NEGATIVE
GLUCOSE, UA: NEGATIVE mg/dL
HGB URINE DIPSTICK: NEGATIVE
Ketones, ur: NEGATIVE mg/dL
Leukocytes, UA: NEGATIVE
Nitrite: NEGATIVE
PROTEIN: NEGATIVE mg/dL
Specific Gravity, Urine: <1.005 — ABNORMAL LOW (ref 1.005–1.030)
pH: 6.5 (ref 5.0–8.0)

## 2016-07-29 LAB — CBC
HCT: 38.5 % (ref 36.0–46.0)
Hemoglobin: 12.6 g/dL (ref 12.0–15.0)
MCH: 30.1 pg (ref 26.0–34.0)
MCHC: 32.7 g/dL (ref 30.0–36.0)
MCV: 92.1 fL (ref 78.0–100.0)
PLATELETS: 303 10*3/uL (ref 150–400)
RBC: 4.18 MIL/uL (ref 3.87–5.11)
RDW: 14.9 % (ref 11.5–15.5)
WBC: 7.9 10*3/uL (ref 4.0–10.5)

## 2016-07-29 LAB — GLUCOSE, CAPILLARY: GLUCOSE-CAPILLARY: 145 mg/dL — AB (ref 65–99)

## 2016-07-29 LAB — SURGICAL PCR SCREEN
MRSA, PCR: NEGATIVE
Staphylococcus aureus: NEGATIVE

## 2016-07-29 NOTE — Pre-Procedure Instructions (Signed)
How to Manage Your Diabetes Before and After Surgery  Why is it important to control my blood sugar before and after surgery? . Improving blood sugar levels before and after surgery helps healing and can limit problems. . A way of improving blood sugar control is eating a healthy diet by: o  Eating less sugar and carbohydrates o  Increasing activity/exercise o  Talking with your doctor about reaching your blood sugar goals . High blood sugars (greater than 180 mg/dL) can raise your risk of infections and slow your recovery, so you will need to focus on controlling your diabetes during the weeks before surgery. . Make sure that the doctor who takes care of your diabetes knows about your planned surgery including the date and location.  How do I manage my blood sugar before surgery? . Check your blood sugar at least 4 times a day, starting 2 days before surgery, to make sure that the level is not too high or low. o Check your blood sugar the morning of your surgery when you wake up and every 2 hours until you get to the Short Stay unit. . If your blood sugar is less than 70 mg/dL, you will need to treat for low blood sugar: o Do not take insulin. o Treat a low blood sugar (less than 70 mg/dL) with  cup of clear juice (cranberry or apple), 4 glucose tablets, OR glucose gel. o Recheck blood sugar in 15 minutes after treatment (to make sure it is greater than 70 mg/dL). If your blood sugar is not greater than 70 mg/dL on recheck, call [phone] for further instructions. . Report your blood sugar to the short stay nurse when you get to Short Stay.  . If you are admitted to the hospital after surgery: o Your blood sugar will be checked by the staff and you will probably be given insulin after surgery (instead of oral diabetes medicines) to make sure you have good blood sugar levels. o The goal for blood sugar control after surgery is 80-180 mg/dL.              WHAT DO I DO ABOUT  MY DIABETES MEDICATION?   Marland Kitchen Do not take oral diabetes medicines (pills) the morning of surgery.  . THE NIGHT BEFORE SURGERY, take ___________ units of ___________insulin.       Marland Kitchen HE MORNING OF SURGERY, take _____________ units of __________insulin.  . The day of surgery, do not take other diabetes injectables, including Byetta (exenatide), Bydureon (exenatide ER), Victoza (liraglutide), or Trulicity (dulaglutide).  . If your CBG is greater than 220 mg/dL, you may take  of your sliding scale (correction) dose of insulin.  Other Instructions:          Patient Signature:  Date:   Nurse Signature:  Date:   Reviewed and Endorsed by [facility] Patient Education Committee, August 2015   Megan [name]  07/29/2016      [id number] [place], [place] [address] [place] [address] Phone: [phone] Fax: [phone]   Your procedure is scheduled on   Report to  at  A.M.  Call this number if you have problems the morning of surgery:     Remember:  Do not eat food or drink liquids after midnight.  Take these medicines the morning of surgery with A SIP OF WATER    Do not wear jewelry, make-up or nail polish.  Do  not wear lotions, powders, or perfumes, or deoderant.  Do not shave 48 hours prior to surgery.  Men may shave face and neck.  Do not bring valuables to the hospital.  [facility] is not responsible for any belongings or valuables.  Contacts, dentures or bridgework may not be worn into surgery.  Leave your suitcase in the car.  After surgery it may be brought to your room.  For patients admitted to the hospital, discharge time will be determined by your treatment team.  Patients discharged the day of surgery will not be allowed to drive home.   Name and phone number of your driver:    Special instructions:   Please read over the following fact sheets that you were given.

## 2016-07-30 ENCOUNTER — Encounter (HOSPITAL_COMMUNITY): Payer: Self-pay | Admitting: Emergency Medicine

## 2016-07-30 ENCOUNTER — Telehealth: Payer: Self-pay | Admitting: Neurology

## 2016-07-30 LAB — URINE CULTURE: CULTURE: NO GROWTH

## 2016-07-30 LAB — HEMOGLOBIN A1C
Hgb A1c MFr Bld: 7.1 % — ABNORMAL HIGH (ref 4.8–5.6)
Mean Plasma Glucose: 157 mg/dL

## 2016-07-30 NOTE — Telephone Encounter (Signed)
LMOM for Megan [name] that [facility] is not the rx'ing physician for Plavix.  She should check with rx'ing physician/fim

## 2016-07-30 NOTE — Progress Notes (Signed)
Anesthesia Chart Review:  Pt is an 82 year old female scheduled for right reverse shoulder arthroplasty on 08/06/2016 with Meredith Pel, M.D.  - PCP is Anda Kraft, MD - Neurologist is Arlice Colt, MD - Cardiologist is Jenkins Rouge, MD, last office visit 07/18/16. Dr. Johnsie Cancel is aware of upcoming surgery.   PMH includes: HTN, DM, moderate aortic stenosis (bu echo 06/20/16), melanoma. Never smoker. BMI 37.5. S/p L shoulder arthroplasty 07/14/14.   Medications include: Amlodipine-valsartan, ASA 325 mg, Plavix, glimepiride, pioglitazone, ranitidine, sitagliptin, Synthroid, Dyazide - Note pt takes plavix for stroke prevention as she has microvascular ischemic changes on head CT. Taren in Dr. Randel Pigg office will reach out to neurology and ask for ok to hold plavix prior to surgery; Weldon Picking will notify pt when to stop it.    Preoperative labs reviewed. HbA1c 7.1, glucose 133  CXR 07/29/16: Old granulomatous disease. No acute abnormalities. Aortic Atherosclerosis   EKG 07/18/16: NSR. RBBB  Nuclear stress test 07/22/16: Normal perfusion. LVEF 74% with normal wall motion. This is a low risk study.  Echo 06/20/16:  - Left ventricle: The cavity size was normal. Wall thickness was increased in a pattern of mild LVH. Wall motion was normal; there were no regional wall motion abnormalities. Features are consistent with a pseudonormal left ventricular filling pattern, with concomitant abnormal relaxation and increased filling   pressure (grade 2 diastolic dysfunction). - Aortic valve: Mildly calcified annulus. Severely thickened, severely calcified leaflets. There was moderate stenosis. There was mild regurgitation. Valve area (VTI): 0.96 cm^2. Valve area (Vmax): 0.86 cm^2. Valve area (Vmean): 0.93 cm^2. - Mitral valve: There was mild to moderate regurgitation. Valve area by continuity equation (using LVOT flow): 1.77 cm^2. - Pulmonary arteries: Systolic pressure was mildly increased. PA peak pressure: 33 mm  Hg (S).  If no changes, I anticipate pt can proceed with surgery as scheduled.   Willeen Cass, FNP-BC [facility]/Anesthesiology Phone: [phone] 07/30/2016 1:21 PM

## 2016-07-30 NOTE — Telephone Encounter (Signed)
She had normal Myoview study about 2 weeks ago and per Dr. Mariana Arn preoperative cardiac risk stratification note she is okay for surgery he called and left her a message to that effect according to the records but I don't think it looks like he ever talk to her.  Can you call her call her and inform her of same thanks

## 2016-07-30 NOTE — Telephone Encounter (Signed)
Taren @ [facility] calling re: a surgery pt is scheduled for on 7-10, wanting to know if Dr Felecia Shelling will okay pt going 5 days without Plavix (before surgery) please call

## 2016-08-08 NOTE — Telephone Encounter (Signed)
Patient aware of results.

## 2016-08-12 ENCOUNTER — Encounter: Payer: Self-pay | Admitting: Physician Assistant

## 2016-08-12 ENCOUNTER — Ambulatory Visit (INDEPENDENT_AMBULATORY_CARE_PROVIDER_SITE_OTHER): Payer: Medicare Other | Admitting: Physician Assistant

## 2016-08-12 VITALS — BP 137/77 | HR 78 | Temp 98.4°F | Resp 18 | Ht 64.0 in | Wt 217.8 lb

## 2016-08-12 DIAGNOSIS — J069 Acute upper respiratory infection, unspecified: Secondary | ICD-10-CM | POA: Diagnosis not present

## 2016-08-12 MED ORDER — IPRATROPIUM BROMIDE 0.06 % NA SOLN: 2.0000 | Freq: Three times a day (TID) | NASAL | 0 refills | Status: DC

## 2016-08-12 MED ORDER — AMOXICILLIN 875 MG PO TABS: 875.0000 mg | ORAL_TABLET | Freq: Two times a day (BID) | ORAL | 0 refills | Status: DC

## 2016-08-12 MED ORDER — GUAIFENESIN ER 1200 MG PO TB12: 1.0000 | ORAL_TABLET | Freq: Two times a day (BID) | ORAL | 0 refills | Status: AC

## 2016-08-12 NOTE — Patient Instructions (Addendum)
  Push fluids.  Ok to use tylenol - you will not want to use motrin because of your arthritis medications.  Use the mucinex and the nose spray to help dry up the mucus - if you are not seeing improvement in the color of your drainage (to clear) and your congestion is not improving you will want to start the antibiotic and call Dr Marlou Sa.   IF you received an x-ray today, you will receive an invoice from [facility]. Please contact [facility] at [phone] with questions or concerns regarding your invoice.   IF you received labwork today, you will receive an invoice from [facility]. Please contact [facility] at [phone] with questions or concerns regarding your invoice.   Our billing staff will not be able to assist you with questions regarding bills from these companies.  You will be contacted with the lab results as soon as they are available. The fastest way to get your results is to activate your My Chart account. Instructions are located on the last page of this paperwork. If you have not heard from [place] regarding the results in 2 weeks, please contact this office.

## 2016-08-12 NOTE — Progress Notes (Signed)
Megan [name]  MRN: [id number] DOB: [birth year]  PCP: Patient, No Pcp Per  Chief Complaint  Patient presents with  . Cough    x9days   . Nasal Congestion  . Headache    Subjective:  Pt presents to clinic for cold symptoms for the last 10 days - started after a trip to [place] where she flew.  Started with a fever which has since resolved.  She has nasal congestion with PND and a dry cough.  She has had some headaches.  She is worried because 7/24 scheduled for shoulder replacement surgery which has been postponed once and she has been told that if she is on abx they will have to postpone surgery.  Review of Systems  Constitutional: Positive for fever (resolved now).  HENT: Positive for congestion, postnasal drip, rhinorrhea (yellow) and sinus pressure. Negative for sore throat.   Respiratory: Positive for cough and shortness of breath (no change with illness ).   Gastrointestinal: Negative.   Musculoskeletal: Negative for myalgias.  Neurological: Positive for headaches.  Psychiatric/Behavioral: Negative for sleep disturbance.    Patient Active Problem List   Diagnosis Date Noted  . Aortic stenosis 06/21/2016  . Lightheaded 06/05/2016  . Poor balance 06/05/2016  . Systolic murmur 40/98/1191  . Sensorineural hearing loss (SNHL), bilateral 02/02/2016  . Arthritis of shoulder region, left, degenerative 07/14/2014    Current Outpatient Prescriptions on File Prior to Visit  Medication Sig Dispense Refill  . ACCU-CHEK AVIVA PLUS test strip     . ACCU-CHEK SOFTCLIX LANCETS lancets     . amLODipine-valsartan (EXFORGE) 5-160 MG per tablet Take 1 tablet by mouth daily.   12  . aspirin EC 325 MG EC tablet Take 1 tablet (325 mg total) by mouth daily. (Patient taking differently: Take 325 mg by mouth daily after breakfast. ) 30 tablet 0  . Calcium Carbonate-Vitamin D (CALCIUM-D PO) Take 1 tablet by mouth at bedtime.    . clopidogrel (PLAVIX) 75 MG tablet Take 75 mg by mouth at bedtime.      Marland Kitchen glimepiride (AMARYL) 2 MG tablet Take 2 mg by mouth daily with breakfast.   12  . Multiple Vitamin (MULTIVITAMIN) tablet Take 1 tablet by mouth at bedtime.     Marland Kitchen MYRBETRIQ 50 MG TB24 tablet Take 50 mg by mouth daily.  11  . naproxen sodium (ANAPROX) 220 MG tablet Take 220 mg by mouth 2 (two) times daily.    . Omega-3 Fatty Acids (FISH OIL) 1200 MG CAPS Take 1,200 mg by mouth at bedtime.    . pioglitazone (ACTOS) 30 MG tablet Take 30 mg by mouth daily.    . Polyvinyl Alcohol-Povidone (REFRESH OP) Place 1 drop into both eyes daily as needed (dry eyes).    . ranitidine (ZANTAC) 150 MG tablet Take 150 mg by mouth 2 (two) times daily.     . sitaGLIPtin (JANUVIA) 100 MG tablet Take 100 mg by mouth daily.    Marland Kitchen SYNTHROID 125 MCG tablet Take 125 mcg by mouth daily before breakfast.   12  . triamterene-hydrochlorothiazide (DYAZIDE) 37.5-25 MG per capsule Take 1 capsule by mouth daily as needed (swelling).   11   No current facility-administered medications on file prior to visit.     No Known Allergies  Pt patients past, family and social history were reviewed and updated.   Objective:  BP 137/77   Pulse 78   Temp 98.4 F (36.9 C) (Oral)   Resp 18   Ht 5'  4" (1.626 m)   Wt 217 lb 12.8 oz (98.8 kg)   LMP  (LMP Unknown)   SpO2 95%   BMI 37.39 kg/m   Physical Exam  Constitutional: She is oriented to person, place, and time and well-developed, well-nourished, and in no distress.  HENT:  Head: Normocephalic and atraumatic.  Right Ear: Hearing, tympanic membrane, external ear and ear canal normal.  Left Ear: Hearing, tympanic membrane, external ear and ear canal normal.  Nose: Nose normal.  Mouth/Throat: Uvula is midline, oropharynx is clear and moist and mucous membranes are normal.  Eyes: Conjunctivae are normal.  Neck: Normal range of motion.  Cardiovascular: Normal rate, regular rhythm and normal heart sounds.   No murmur heard. Pulmonary/Chest: Effort normal and breath sounds  normal.  Neurological: She is alert and oriented to person, place, and time. Gait normal.  Skin: Skin is warm and dry.  Psychiatric: Mood, memory, affect and judgment normal.  Vitals reviewed.   Assessment and Plan :  Acute URI - Plan: amoxicillin (AMOXIL) 875 MG tablet, Guaifenesin (MUCINEX MAXIMUM STRENGTH) 1200 MG TB12, ipratropium (ATROVENT) 0.06 % nasal spray   Symptomatic treatment -- push fluids -- pt was given an abx to start if she is not improving in the next 3-5 days - she will be in contact with the surgeon.  Windell Hummingbird PA-C  [facility] 08/15/2016 8:08 AM

## 2016-08-14 DIAGNOSIS — L309 Dermatitis, unspecified: Secondary | ICD-10-CM | POA: Diagnosis not present

## 2016-08-14 DIAGNOSIS — D225 Melanocytic nevi of trunk: Secondary | ICD-10-CM | POA: Diagnosis not present

## 2016-08-14 DIAGNOSIS — L814 Other melanin hyperpigmentation: Secondary | ICD-10-CM | POA: Diagnosis not present

## 2016-08-14 DIAGNOSIS — Z85820 Personal history of malignant melanoma of skin: Secondary | ICD-10-CM | POA: Diagnosis not present

## 2016-08-14 DIAGNOSIS — L821 Other seborrheic keratosis: Secondary | ICD-10-CM | POA: Diagnosis not present

## 2016-08-19 ENCOUNTER — Inpatient Hospital Stay (INDEPENDENT_AMBULATORY_CARE_PROVIDER_SITE_OTHER): Payer: Medicare Other | Admitting: Orthopedic Surgery

## 2016-08-19 DIAGNOSIS — H43392 Other vitreous opacities, left eye: Secondary | ICD-10-CM | POA: Diagnosis not present

## 2016-08-19 DIAGNOSIS — H43812 Vitreous degeneration, left eye: Secondary | ICD-10-CM | POA: Diagnosis not present

## 2016-08-20 ENCOUNTER — Encounter (HOSPITAL_COMMUNITY): Admission: RE | Payer: Self-pay | Source: Ambulatory Visit

## 2016-08-20 ENCOUNTER — Inpatient Hospital Stay (HOSPITAL_COMMUNITY): Admission: RE | Admit: 2016-08-20 | Payer: Medicare Other | Source: Ambulatory Visit | Admitting: Orthopedic Surgery

## 2016-08-20 SURGERY — ARTHROPLASTY, SHOULDER, TOTAL, REVERSE
Anesthesia: General | Laterality: Right

## 2016-09-02 ENCOUNTER — Inpatient Hospital Stay (INDEPENDENT_AMBULATORY_CARE_PROVIDER_SITE_OTHER): Payer: Medicare Other | Admitting: Orthopedic Surgery

## 2016-09-03 DIAGNOSIS — I10 Essential (primary) hypertension: Secondary | ICD-10-CM | POA: Diagnosis not present

## 2016-09-03 DIAGNOSIS — E118 Type 2 diabetes mellitus with unspecified complications: Secondary | ICD-10-CM | POA: Diagnosis not present

## 2016-09-03 DIAGNOSIS — E039 Hypothyroidism, unspecified: Secondary | ICD-10-CM | POA: Diagnosis not present

## 2016-09-03 DIAGNOSIS — E789 Disorder of lipoprotein metabolism, unspecified: Secondary | ICD-10-CM | POA: Diagnosis not present

## 2016-09-17 ENCOUNTER — Encounter (INDEPENDENT_AMBULATORY_CARE_PROVIDER_SITE_OTHER): Payer: Self-pay | Admitting: Orthopedic Surgery

## 2016-09-19 DIAGNOSIS — Z01419 Encounter for gynecological examination (general) (routine) without abnormal findings: Secondary | ICD-10-CM | POA: Diagnosis not present

## 2016-09-20 ENCOUNTER — Telehealth (INDEPENDENT_AMBULATORY_CARE_PROVIDER_SITE_OTHER): Payer: Self-pay | Admitting: Physical Medicine and Rehabilitation

## 2016-09-20 NOTE — Telephone Encounter (Signed)
Left message for patient to call to discuss.  

## 2016-09-20 NOTE — Telephone Encounter (Signed)
Ok if helped, but after that needs updated MRI, plavix?

## 2016-09-23 NOTE — Telephone Encounter (Signed)
Patient said her back and hip are not really hurting very much. She is having bilateral calf pain now which is worse first thing in the morning. She said she was recently prescribes pioglitazone for her diabetes, and she is wondering if the pain could be a side effect of that. She is still taking Plavix. Please advise.

## 2016-09-23 NOTE — Telephone Encounter (Signed)
She should ask the folks that prescribed that medication, I am not that familiar with it but some of those can cause swelling etc

## 2016-09-25 NOTE — Telephone Encounter (Signed)
Form faxed to Helayne [place], [place] at [facility] to get OK to patient to hold Plavix.

## 2016-09-25 NOTE — Telephone Encounter (Signed)
Called patient and left message.

## 2016-10-03 NOTE — Telephone Encounter (Signed)
Received ok for patient to hold Plavix x5 days, Scheduled for 10/09/16 at 1400.

## 2016-10-08 ENCOUNTER — Ambulatory Visit (INDEPENDENT_AMBULATORY_CARE_PROVIDER_SITE_OTHER): Payer: Medicare Other | Admitting: Neurology

## 2016-10-08 ENCOUNTER — Encounter (INDEPENDENT_AMBULATORY_CARE_PROVIDER_SITE_OTHER): Payer: Self-pay

## 2016-10-08 ENCOUNTER — Encounter: Payer: Self-pay | Admitting: Neurology

## 2016-10-08 VITALS — BP 136/60 | HR 82 | Resp 20 | Ht 64.0 in | Wt 215.5 lb

## 2016-10-08 DIAGNOSIS — R2689 Other abnormalities of gait and mobility: Secondary | ICD-10-CM | POA: Diagnosis not present

## 2016-10-08 DIAGNOSIS — I350 Nonrheumatic aortic (valve) stenosis: Secondary | ICD-10-CM | POA: Diagnosis not present

## 2016-10-08 DIAGNOSIS — R42 Dizziness and giddiness: Secondary | ICD-10-CM

## 2016-10-08 NOTE — Progress Notes (Signed)
[facility]  [name] DOB: [birth year]  REFERRING DOCTOR OR PCP:  Dr. Wilson Singer SOURCE: patient, notes from Dr. Wilson Singer, lab reports  _________________________________   HISTORICAL  CHIEF COMPLAINT:  Chief Complaint  Patient presents with  . Dizziness    Sts. lightheadedness has been improved; she still has occasional trouble with balance.  Saw cardiologist, had negative stress test, and plans to repeat ECHO in December/fim    HISTORY OF PRESENT ILLNESS:  Megan [name] is a 82 year old woman with episodes of lightheadedness and poor balance.   Since the last visit, she has not had any more of the episodes of severe lightheadedness with some vertigo. An echocardiogram showed she had moderate aortic stenosis and she was seen by Dr. Johnsie Cancel of cardiology 9stufies and notes reviewed). He is going to follow her echocardiograms and a procedure may be necessary at some point.    She feels unsteady going up and down stairs and needs to hold on.   She has stress incontinence and urgency.   She denies neck pain.      She has no more falls. She did fall last year and a head CT scan 01/17/2016 did not show any evidence of stroke though she did have mild chronic microvascular ischemic change.  She denies any weakness in her legs but she has some numbness and pain and sees Pain management who has done epidural steroid injections.   She feels some of her unsteadiness is coming from her back and knees.   She has some urinary frequency helped by medication. This is chronic.    REVIEW OF SYSTEMS: Constitutional: No fevers, chills, sweats, or change in appetite.   Mild fatigue Eyes: No visual changes, double vision, eye pain Ear, nose and throat: No hearing loss, ear pain, nasal congestion, sore throat Cardiovascular: No chest pain, palpitations Respiratory: No shortness of breath at rest or with exertion.   No wheezes GastrointestinaI: No nausea, vomiting, diarrhea,  abdominal pain, fecal incontinence Genitourinary: No dysuria, urinary retention or frequency.  No nocturia. Musculoskeletal: Shoulder and back pain.  Integumentary: No rash, pruritus, skin lesions Neurological: as above Psychiatric: No depression at this time.  No anxiety Endocrine: No palpitations, diaphoresis, change in appetite, change in weigh or increased thirst Hematologic/Lymphatic: No anemia, purpura, petechiae. Allergic/Immunologic: No itchy/runny eyes, nasal congestion, recent allergic reactions, rashes  ALLERGIES: No Known Allergies  HOME MEDICATIONS:  Current Outpatient Prescriptions:  .  ACCU-CHEK AVIVA PLUS test strip, , Disp: , Rfl:  .  ACCU-CHEK SOFTCLIX LANCETS lancets, , Disp: , Rfl:  .  amLODipine-valsartan (EXFORGE) 5-160 MG per tablet, Take 1 tablet by mouth daily. , Disp: , Rfl: 12 .  aspirin EC 325 MG EC tablet, Take 1 tablet (325 mg total) by mouth daily. (Patient taking differently: Take 325 mg by mouth daily after breakfast. ), Disp: 30 tablet, Rfl: 0 .  Calcium Carbonate-Vitamin D (CALCIUM-D PO), Take 1 tablet by mouth at bedtime., Disp: , Rfl:  .  clopidogrel (PLAVIX) 75 MG tablet, Take 75 mg by mouth at bedtime. , Disp: , Rfl:  .  glimepiride (AMARYL) 2 MG tablet, Take 2 mg by mouth daily with breakfast. , Disp: , Rfl: 12 .  Multiple Vitamin (MULTIVITAMIN) tablet, Take 1 tablet by mouth at bedtime. , Disp: , Rfl:  .  MYRBETRIQ 50 MG TB24 tablet, Take 50 mg by mouth daily., Disp: , Rfl: 11 .  naproxen sodium (ANAPROX) 220 MG tablet, Take 220 mg by mouth 2 (two)  times daily., Disp: , Rfl:  .  Omega-3 Fatty Acids (FISH OIL) 1200 MG CAPS, Take 1,200 mg by mouth at bedtime., Disp: , Rfl:  .  Polyvinyl Alcohol-Povidone (REFRESH OP), Place 1 drop into both eyes daily as needed (dry eyes)., Disp: , Rfl:  .  ranitidine (ZANTAC) 150 MG tablet, Take 150 mg by mouth 2 (two) times daily. , Disp: , Rfl:  .  sitaGLIPtin (JANUVIA) 100 MG tablet, Take 100 mg by mouth  daily., Disp: , Rfl:  .  SYNTHROID 125 MCG tablet, Take 125 mcg by mouth daily before breakfast. , Disp: , Rfl: 12 .  triamterene-hydrochlorothiazide (DYAZIDE) 37.5-25 MG per capsule, Take 1 capsule by mouth daily as needed (swelling). , Disp: , Rfl: 11  PAST MEDICAL HISTORY: Past Medical History:  Diagnosis Date  . Arthritis   . Cancer ([place])    melanoma  . Diabetes mellitus without complication ([place])   . Dizziness   . Dyspnea   . Heart murmur   . History of hiatal hernia   . Hypertension     PAST SURGICAL HISTORY: Past Surgical History:  Procedure Laterality Date  . APPENDECTOMY     82 yrs old  . CHOLECYSTECTOMY  89  . JOINT REPLACEMENT Right    knee   . REVERSE SHOULDER ARTHROPLASTY Left 07/14/2014  . REVERSE SHOULDER ARTHROPLASTY Left 07/14/2014   Procedure: LEFT REVERSE SHOULDER ARTHROPLASTY;  Surgeon: Meredith Pel, MD;  Location: [facility];  Service: Orthopedics;  Laterality: Left;  . SHOULDER ARTHROSCOPY Left     FAMILY HISTORY: No family history on file.  SOCIAL HISTORY:  Social History   Social History  . Marital status: Widowed    Spouse name: N/A  . Number of children: N/A  . Years of education: N/A   Occupational History  . Not on file.   Social History Main Topics  . Smoking status: Never Smoker  . Smokeless tobacco: Never Used  . Alcohol use No  . Drug use: No  . Sexual activity: Not on file   Other Topics Concern  . Not on file   Social History Narrative  . No narrative on file     PHYSICAL EXAM  Vitals:   10/08/16 1052  BP: 136/60  Pulse: 82  Resp: 20  Weight: 215 lb 8 oz (97.8 kg)  Height: 5\' 4"  (1.626 m)   No data found.    Body mass index is 36.99 kg/m.   General: The patient is well-developed and well-nourished and in no acute distress   Cardiovascular: The heart has a regular rate and rhythm with a normal S1 and S2. She had a systolic murmur.. Lungs are clear to auscultation.  Skin: Extremities are without  significant edema.  Neurologic Exam  Mental status: The patient is alert and oriented x 3 at the time of the examination. The patient has apparent normal recent and remote memory, with an apparently normal attention span and concentration ability.   Speech is normal.  Cranial nerves: Extraocular movements are full.  Facial strength and sensation is normal. Trapezius strength is normal. The tongue is midline, and the patient has symmetric elevation of the soft palate. No obvious hearing deficits are noted.  Motor:  Muscle bulk is normal.   Tone is normal. Strength is  5 / 5 in all 4 extremities.   Sensory: There is mild reduced vibratory sensation at the toes and normal sensation to touch and vibration elsewhere..  Coordination: Cerebellar testing reveals good finger-nose-finger and  heel-to-shin bilaterally.  Gait and station: Station is normal.   Gait is mildly arthritic. Tandem gait is wide. Romberg is negative.   Reflexes: Deep tendon reflexes are symmetric and normal bilaterally.        DIAGNOSTIC DATA (LABS, IMAGING, TESTING) - I reviewed patient records, labs, notes, testing and imaging myself where available.  Lab Results  Component Value Date   WBC 7.9 07/29/2016   HGB 12.6 07/29/2016   HCT 38.5 07/29/2016   MCV 92.1 07/29/2016   PLT 303 07/29/2016      Component Value Date/Time   NA 138 07/29/2016 1035   K 3.8 07/29/2016 1035   CL 105 07/29/2016 1035   CO2 22 07/29/2016 1035   GLUCOSE 133 (H) 07/29/2016 1035   BUN 17 07/29/2016 1035   CREATININE 0.87 07/29/2016 1035   CALCIUM 9.4 07/29/2016 1035   GFRNONAA >60 07/29/2016 1035   GFRAA >60 07/29/2016 1035       ASSESSMENT AND PLAN  No diagnosis found.   1.    We discussed that some of her symptoms could be due to the aortic stenosis though I do not think that that explains her sensation of being off balance. 2.   She should continue to use the hand rails when going up or down stairs. If gait worsens we will  need to consider getting an MRI of the cervical spine to make sure that spinal stenosis is not playing a role. However, as reflexes are not elevated this is unlikely. 3.  She will return to see me as needed.   She should call if she notes an increase in her episodes of dizziness or if gait worsens.  Delio Slates A. Felecia Shelling, MD, PhD 1/49/7026, 37:85 AM Certified in Neurology, Clinical Neurophysiology, Sleep Medicine, Pain Medicine and Neuroimaging  [facility] [address] [place], [place] [address] [phone]

## 2016-10-09 ENCOUNTER — Encounter (INDEPENDENT_AMBULATORY_CARE_PROVIDER_SITE_OTHER): Payer: Self-pay | Admitting: Physical Medicine and Rehabilitation

## 2016-10-09 ENCOUNTER — Ambulatory Visit (INDEPENDENT_AMBULATORY_CARE_PROVIDER_SITE_OTHER): Payer: Medicare Other | Admitting: Physical Medicine and Rehabilitation

## 2016-10-09 ENCOUNTER — Ambulatory Visit (INDEPENDENT_AMBULATORY_CARE_PROVIDER_SITE_OTHER): Payer: Medicare Other

## 2016-10-09 VITALS — BP 150/68 | HR 79 | Temp 98.0°F

## 2016-10-09 DIAGNOSIS — M5416 Radiculopathy, lumbar region: Secondary | ICD-10-CM

## 2016-10-09 MED ORDER — LIDOCAINE HCL (PF) 1 % IJ SOLN
2.0000 mL | Freq: Once | INTRAMUSCULAR | Status: AC
  Administered 2016-10-09: 2 mL

## 2016-10-09 MED ORDER — BETAMETHASONE SOD PHOS & ACET 6 (3-3) MG/ML IJ SUSP
12.0000 mg | Freq: Once | INTRAMUSCULAR | Status: AC
  Administered 2016-10-09: 12 mg

## 2016-10-09 NOTE — Patient Instructions (Signed)

## 2016-10-09 NOTE — Progress Notes (Deleted)
Pain across lower back and down back of both legs to calf. Right=left. Pain is worse first thing in the morning. Relief with laying. Denies numbness and tingling.   Stopped taking Plavix on 10/04/16

## 2016-10-14 NOTE — Procedures (Signed)
Megan [name] is a pleasant 82 year old female who is followed by Dr. Marlou Sa for her shoulder and she is also seeing Dr. Sharol Given in the past. She's had a history of multiple epidural injections in the past even before seeing me. MRI from as far back as 2006 showed stenosis at L3-4 and L4-5. Last epidural injection in May gave her quite a bit relief. We'll go ahead and repeat this today. She's had no new changes of her symptoms just worsening over the last many weeks. She has pain across her lower back and down both legs to the calf. Patient is reporting pain worse in the warning and she does get relief with sitting and laying. She denies any real numbness tingling or paresthesias or focal weakness. Depending on the relief she gets with this at some point we would look soon at updating her MRI and we discussed this at length with her.  Lumbar Epidural Steroid Injection - Interlaminar Approach with Fluoroscopic Guidance  [name]      Date of Birth: 1934-01-30 MRN: [id number] PCP: [facility]      Visit Date: 10/09/2016   Universal Protocol:     Consent Given By: the patient  Position: PRONE  Additional Comments: Vital signs were monitored before and after the procedure. Patient was prepped and draped in the usual sterile fashion. The correct patient, procedure, and site was verified.   Injection Procedure Details:  Procedure Site One Meds Administered:  Meds ordered this encounter  Medications  . lidocaine (PF) (XYLOCAINE) 1 % injection 2 mL  . betamethasone acetate-betamethasone sodium phosphate (CELESTONE) injection 12 mg     Laterality: Right  Location/Site:  L5-S1  Needle size: 20 G  Needle type: Tuohy  Needle Placement: Paramedian epidural  Findings:  -Contrast Used: 1 mL iohexol 180 mg iodine/mL   -Comments: Excellent flow of contrast into the epidural space.  Procedure Details: Using a paramedian approach from the side mentioned above, the  region overlying the inferior lamina was localized under fluoroscopic visualization and the soft tissues overlying this structure were infiltrated with 4 ml. of 1% Lidocaine without Epinephrine. The Tuohy needle was inserted into the epidural space using a paramedian approach.   The epidural space was localized using loss of resistance along with lateral and bi-planar fluoroscopic views.  After negative aspirate for air, blood, and CSF, a 2 ml. volume of Isovue-250 was injected into the epidural space and the flow of contrast was observed. Radiographs were obtained for documentation purposes.    The injectate was administered into the level noted above.   Additional Comments:  The patient tolerated the procedure well Dressing: Band-Aid    Post-procedure details: Patient was observed during the procedure. Post-procedure instructions were reviewed.  Patient left the clinic in stable condition.

## 2016-10-22 ENCOUNTER — Other Ambulatory Visit (INDEPENDENT_AMBULATORY_CARE_PROVIDER_SITE_OTHER): Payer: Self-pay | Admitting: Orthopedic Surgery

## 2016-10-22 DIAGNOSIS — M19011 Primary osteoarthritis, right shoulder: Secondary | ICD-10-CM

## 2016-10-23 NOTE — Pre-Procedure Instructions (Signed)
Megan [name]  10/23/2016      [id number] [place], [place] - [address] [place] [address] Phone: [phone] Fax: [phone]    Your procedure is scheduled on Tues. Oct 2  Report to [facility] Admitting at 5:30 A.M.  Call this number if you have problems the morning of surgery:  [phone]   Remember:  Do not eat food or drink liquids after midnight on Mon. Oct. 1   Take these medicines the morning of surgery with A SIP OF WATER : eye drops, ranitidine (zantac), synthroid, myrbetriq             7 days prior to surgery STOP taking any Aleve, Naproxen, Ibuprofen, Motrin, Advil, Goody's, BC's, all herbal medications, fish oil, and all vitamins             Stop aspirin and plavix per Dr. Marlou Sa              WHAT DO I DO ABOUT MY DIABETES MEDICATION?   Marland Kitchen Do not take oral diabetes medicines (pills) the morning of surgery.   How to Manage Your Diabetes Before and After Surgery  Why is it important to control my blood sugar before and after surgery? . Improving blood sugar levels before and after surgery helps healing and can limit problems. . A way of improving blood sugar control is eating a healthy diet by: o  Eating less sugar and carbohydrates o  Increasing activity/exercise o  Talking with your doctor about reaching your blood sugar goals . High blood sugars (greater than 180 mg/dL) can raise your risk of infections and slow your recovery, so you will need to focus on controlling your diabetes during the weeks before surgery. . Make sure that the doctor who takes care of your diabetes knows about your planned surgery including the date and location.  How do I manage my blood sugar before surgery? . Check your blood sugar at least 4 times a day, starting 2 days before surgery, to make sure that the level is not too high or low. o Check your blood sugar the morning of your surgery when you  wake up and every 2 hours until you get to the Short Stay unit. . If your blood sugar is less than 70 mg/dL, you will need to treat for low blood sugar: o Do not take insulin. o Treat a low blood sugar (less than 70 mg/dL) with  cup of clear juice (cranberry or apple), 4 glucose tablets, OR glucose gel. o Recheck blood sugar in 15 minutes after treatment (to make sure it is greater than 70 mg/dL). If your blood sugar is not greater than 70 mg/dL on recheck, call [phone] for further instructions. . Report your blood sugar to the short stay nurse when you get to Short Stay.  . If you are admitted to the hospital after surgery: o Your blood sugar will be checked by the staff and you will probably be given insulin after surgery (instead of oral diabetes medicines) to make sure you have good blood sugar levels. o The goal for blood sugar control after surgery is 80-180 mg/dL.    Do not wear jewelry, make-up or nail polish.  Do not wear lotions, powders, or perfumes, or deoderant.  Do not shave 48 hours prior to surgery.  Men may shave face and neck.  Do not bring valuables to the hospital.  [facility] is not responsible for any belongings or valuables.  Contacts, dentures or bridgework may not be worn into surgery.  Leave your suitcase in the car.  After surgery it may be brought to your room.  For patients admitted to the hospital, discharge time will be determined by your treatment team.  Patients discharged the day of surgery will not be allowed to drive home.    Special instructions:  - Preparing For Surgery  Before surgery, you can play an important role. Because skin is not sterile, your skin needs to be as free of germs as possible. You can reduce the number of germs on your skin by washing with CHG (chlorahexidine gluconate) Soap before surgery.  CHG is an antiseptic cleaner which kills germs and bonds with the skin to continue killing germs even after  washing.  Please do not use if you have an allergy to CHG or antibacterial soaps. If your skin becomes reddened/irritated stop using the CHG.  Do not shave (including legs and underarms) for at least 48 hours prior to first CHG shower. It is OK to shave your face.  Please follow these instructions carefully.   1. Shower the NIGHT BEFORE SURGERY and the MORNING OF SURGERY with CHG.   2. If you chose to wash your hair, wash your hair first as usual with your normal shampoo.  3. After you shampoo, rinse your hair and body thoroughly to remove the shampoo.  4. Use CHG as you would any other liquid soap. You can apply CHG directly to the skin and wash gently with a scrungie or a clean washcloth.   5. Apply the CHG Soap to your body ONLY FROM THE NECK DOWN.  Do not use on open wounds or open sores. Avoid contact with your eyes, ears, mouth and genitals (private parts). Wash genitals (private parts) with your normal soap.  6. Wash thoroughly, paying special attention to the area where your surgery will be performed.  7. Thoroughly rinse your body with warm water from the neck down.  8. DO NOT shower/wash with your normal soap after using and rinsing off the CHG Soap.  9. Pat yourself dry with a CLEAN TOWEL.   10. Wear CLEAN PAJAMAS   11. Place CLEAN SHEETS on your bed the night of your first shower and DO NOT SLEEP WITH PETS.    Day of Surgery: Do not apply any deodorants/lotions. Please wear clean clothes to the hospital/surgery center.      Please read over the following fact sheets that you were given. Coughing and Deep Breathing and Surgical Site Infection Prevention

## 2016-10-24 ENCOUNTER — Encounter (HOSPITAL_COMMUNITY)
Admission: RE | Admit: 2016-10-24 | Discharge: 2016-10-24 | Disposition: A | Discharge status: Home / Self Care | Payer: Medicare Other | Source: Ambulatory Visit | Attending: Orthopedic Surgery | Admitting: Orthopedic Surgery

## 2016-10-24 ENCOUNTER — Encounter (HOSPITAL_COMMUNITY): Payer: Self-pay

## 2016-10-24 DIAGNOSIS — Z01812 Encounter for preprocedural laboratory examination: Secondary | ICD-10-CM | POA: Diagnosis not present

## 2016-10-24 HISTORY — DX: Gastro-esophageal reflux disease without esophagitis: K21.9

## 2016-10-24 HISTORY — DX: Hypothyroidism, unspecified: E03.9

## 2016-10-24 LAB — BASIC METABOLIC PANEL
Anion gap: 10 (ref 5–15)
BUN: 12 mg/dL (ref 6–20)
CALCIUM: 9.2 mg/dL (ref 8.9–10.3)
CO2: 24 mmol/L (ref 22–32)
CREATININE: 0.76 mg/dL (ref 0.44–1.00)
Chloride: 103 mmol/L (ref 101–111)
GFR calc Af Amer: >60 mL/min (ref >60)
Glucose, Bld: 204 mg/dL — ABNORMAL HIGH (ref 65–99)
Potassium: 3.7 mmol/L (ref 3.5–5.1)
SODIUM: 137 mmol/L (ref 135–145)

## 2016-10-24 LAB — GLUCOSE, CAPILLARY: Glucose-Capillary: 237 mg/dL — ABNORMAL HIGH (ref 65–99)

## 2016-10-24 LAB — CBC
HCT: 40.5 % (ref 36.0–46.0)
Hemoglobin: 13.3 g/dL (ref 12.0–15.0)
MCH: 30 pg (ref 26.0–34.0)
MCHC: 32.8 g/dL (ref 30.0–36.0)
MCV: 91.4 fL (ref 78.0–100.0)
PLATELETS: 311 10*3/uL (ref 150–400)
RBC: 4.43 MIL/uL (ref 3.87–5.11)
RDW: 13.7 % (ref 11.5–15.5)
WBC: 8.6 10*3/uL (ref 4.0–10.5)

## 2016-10-24 LAB — HEMOGLOBIN A1C
HEMOGLOBIN A1C: 7.2 % — AB (ref 4.8–5.6)
MEAN PLASMA GLUCOSE: 159.94 mg/dL

## 2016-10-24 LAB — SURGICAL PCR SCREEN
MRSA, PCR: NEGATIVE
STAPHYLOCOCCUS AUREUS: NEGATIVE

## 2016-10-24 NOTE — Progress Notes (Addendum)
Dr. Randel Pigg orders in epic not signed, also no labs/antibiotic ordered. Left message at his office .  PCP: Helayne Seminole, PA @ [facility]  Cardiologist: Dr. Johnsie Cancel  Neurologist: Dr. Felecia Shelling  Fasting sugars 130-160.

## 2016-10-25 NOTE — Progress Notes (Signed)
Anesthesia Chart Review:  Pt is an 82 year old female scheduled for right reverse shoulder arthroplasty on 10/29/2016 with Meredith Pel, M.D.  - PCP is Anda Kraft, MD - Neurologist is Arlice Colt, MD - Cardiologist is Jenkins Rouge, MD, last office visit 07/18/16. Dr. Johnsie Cancel is aware of upcoming surgery.   PMH includes: HTN, DM, moderate aortic stenosis (by echo 06/20/16), melanoma. Never smoker. BMI 37. S/p L shoulder arthroplasty 07/14/14.   Medications include: Amlodipine-valsartan, ASA 325 mg, Plavix, glimepiride, ranitidine, sitagliptin, Synthroid, Dyazide. Last dose plavix 10/23/16.  - Note pt takes plavix for stroke prevention as she has microvascular ischemic changes on head CT.     Preoperative labs reviewed.  - HbA1c 7.2, glucose 204 - Orders not signed at PAT. UA/urine culture ordered by Dr. Marlou Sa will be obtained DOS if orders are signed.   CXR 07/29/16: Old granulomatous disease. No acute abnormalities. Aortic Atherosclerosis   EKG 07/18/16: NSR. RBBB  Nuclear stress test 07/22/16: Normal perfusion. LVEF 74% with normal wall motion. This is a low risk study.  Echo 06/20/16:  - Left ventricle: The cavity size was normal. Wall thickness wasincreased in a pattern of mild LVH. Wall motion was normal; therewere no regional wall motion abnormalities. Features areconsistent with a pseudonormal left ventricular filling pattern,with concomitant abnormal relaxation and increased filling pressure (grade 2 diastolic dysfunction). - Aortic valve: Mildly calcified annulus. Severely thickened, severely calcified leaflets. There was moderate stenosis. Therewas mild regurgitation. Valve area (VTI): 0.96 cm^2. Valve area(Vmax): 0.86 cm^2. Valve area (Vmean): 0.93 cm^2. - Mitral valve: There was mild to moderate regurgitation. Valvearea by continuity equation (using LVOT flow): 1.77 cm^2. - Pulmonary arteries: Systolic pressure was mildly increased. PApeak pressure: 33 mm Hg  (S).  If no changes, I anticipate pt can proceed with surgery as scheduled.   Willeen Cass, FNP-BC [facility]/Anesthesiology Phone: [phone] 10/25/2016 11:07 AM

## 2016-10-28 MED ORDER — CEFAZOLIN SODIUM-DEXTROSE 2-4 GM/100ML-% IV SOLN
2.0000 g | INTRAVENOUS | Status: AC
  Administered 2016-10-29: 2 g via INTRAVENOUS
  Filled 2016-10-28: qty 100

## 2016-10-29 ENCOUNTER — Inpatient Hospital Stay (HOSPITAL_COMMUNITY): Payer: Medicare Other

## 2016-10-29 ENCOUNTER — Inpatient Hospital Stay (HOSPITAL_COMMUNITY)
Admission: RE | Admit: 2016-10-29 | Discharge: 2016-10-31 | DRG: 483 | Disposition: A | Discharge status: Home / Self Care | Payer: Medicare Other | Source: Ambulatory Visit | Attending: Orthopedic Surgery | Admitting: Orthopedic Surgery

## 2016-10-29 ENCOUNTER — Encounter (HOSPITAL_COMMUNITY): Payer: Self-pay | Admitting: *Deleted

## 2016-10-29 ENCOUNTER — Encounter (HOSPITAL_COMMUNITY)
Admission: RE | Disposition: A | Discharge status: Home / Self Care | Payer: Self-pay | Source: Ambulatory Visit | Attending: Orthopedic Surgery

## 2016-10-29 ENCOUNTER — Inpatient Hospital Stay (HOSPITAL_COMMUNITY): Payer: Medicare Other | Admitting: Emergency Medicine

## 2016-10-29 DIAGNOSIS — Z471 Aftercare following joint replacement surgery: Secondary | ICD-10-CM | POA: Diagnosis not present

## 2016-10-29 DIAGNOSIS — Z9049 Acquired absence of other specified parts of digestive tract: Secondary | ICD-10-CM

## 2016-10-29 DIAGNOSIS — I10 Essential (primary) hypertension: Secondary | ICD-10-CM | POA: Diagnosis not present

## 2016-10-29 DIAGNOSIS — M19011 Primary osteoarthritis, right shoulder: Secondary | ICD-10-CM | POA: Diagnosis not present

## 2016-10-29 DIAGNOSIS — Z7902 Long term (current) use of antithrombotics/antiplatelets: Secondary | ICD-10-CM | POA: Diagnosis not present

## 2016-10-29 DIAGNOSIS — Z85820 Personal history of malignant melanoma of skin: Secondary | ICD-10-CM

## 2016-10-29 DIAGNOSIS — R011 Cardiac murmur, unspecified: Secondary | ICD-10-CM | POA: Diagnosis not present

## 2016-10-29 DIAGNOSIS — E039 Hypothyroidism, unspecified: Secondary | ICD-10-CM | POA: Diagnosis present

## 2016-10-29 DIAGNOSIS — G8918 Other acute postprocedural pain: Secondary | ICD-10-CM | POA: Diagnosis not present

## 2016-10-29 DIAGNOSIS — Z23 Encounter for immunization: Secondary | ICD-10-CM | POA: Diagnosis not present

## 2016-10-29 DIAGNOSIS — M19019 Primary osteoarthritis, unspecified shoulder: Secondary | ICD-10-CM | POA: Diagnosis present

## 2016-10-29 DIAGNOSIS — Z7982 Long term (current) use of aspirin: Secondary | ICD-10-CM

## 2016-10-29 DIAGNOSIS — Z96612 Presence of left artificial shoulder joint: Secondary | ICD-10-CM | POA: Diagnosis present

## 2016-10-29 DIAGNOSIS — K219 Gastro-esophageal reflux disease without esophagitis: Secondary | ICD-10-CM | POA: Diagnosis not present

## 2016-10-29 DIAGNOSIS — I350 Nonrheumatic aortic (valve) stenosis: Secondary | ICD-10-CM | POA: Diagnosis not present

## 2016-10-29 DIAGNOSIS — Z96611 Presence of right artificial shoulder joint: Secondary | ICD-10-CM | POA: Diagnosis not present

## 2016-10-29 DIAGNOSIS — M25511 Pain in right shoulder: Secondary | ICD-10-CM | POA: Diagnosis present

## 2016-10-29 HISTORY — PX: TOTAL SHOULDER REVISION: SHX6130

## 2016-10-29 LAB — URINALYSIS, COMPLETE (UACMP) WITH MICROSCOPIC
BILIRUBIN URINE: NEGATIVE
GLUCOSE, UA: NEGATIVE mg/dL
HGB URINE DIPSTICK: NEGATIVE
KETONES UR: NEGATIVE mg/dL
NITRITE: NEGATIVE
PH: 5.5 (ref 5.0–8.0)
Protein, ur: NEGATIVE mg/dL
Specific Gravity, Urine: 1.02 (ref 1.005–1.030)

## 2016-10-29 LAB — GLUCOSE, CAPILLARY
GLUCOSE-CAPILLARY: 214 mg/dL — AB (ref 65–99)
Glucose-Capillary: 180 mg/dL — ABNORMAL HIGH (ref 65–99)
Glucose-Capillary: 151 mg/dL — ABNORMAL HIGH (ref 65–99)
Glucose-Capillary: 216 mg/dL — ABNORMAL HIGH (ref 65–99)
Glucose-Capillary: 211 mg/dL — ABNORMAL HIGH (ref 65–99)

## 2016-10-29 SURGERY — REVISION, TOTAL ARTHROPLASTY, SHOULDER
Anesthesia: Regional | Site: Shoulder | Laterality: Right

## 2016-10-29 MED ORDER — FAMOTIDINE 20 MG PO TABS
10.0000 mg | ORAL_TABLET | Freq: Every day | ORAL | Status: DC
  Administered 2016-10-30 – 2016-10-31 (×2): 10 mg via ORAL
  Filled 2016-10-29 (×2): qty 1

## 2016-10-29 MED ORDER — IRBESARTAN 150 MG PO TABS
150.0000 mg | ORAL_TABLET | Freq: Every day | ORAL | Status: DC
  Administered 2016-10-29 – 2016-10-31 (×3): 150 mg via ORAL
  Filled 2016-10-29 (×4): qty 1

## 2016-10-29 MED ORDER — CEFAZOLIN SODIUM-DEXTROSE 2-4 GM/100ML-% IV SOLN
2.0000 g | Freq: Four times a day (QID) | INTRAVENOUS | Status: AC
  Administered 2016-10-29 (×2): 2 g via INTRAVENOUS
  Filled 2016-10-29 (×2): qty 100

## 2016-10-29 MED ORDER — CLOPIDOGREL BISULFATE 75 MG PO TABS
75.0000 mg | ORAL_TABLET | Freq: Every day | ORAL | Status: DC
  Administered 2016-10-29 – 2016-10-30 (×2): 75 mg via ORAL
  Filled 2016-10-29 (×2): qty 1

## 2016-10-29 MED ORDER — MEPERIDINE HCL 25 MG/ML IJ SOLN: 6.2500 mg | INTRAMUSCULAR | Status: DC | PRN

## 2016-10-29 MED ORDER — MIRABEGRON ER 50 MG PO TB24
50.0000 mg | ORAL_TABLET | Freq: Every day | ORAL | Status: DC
  Administered 2016-10-30 – 2016-10-31 (×2): 50 mg via ORAL
  Filled 2016-10-29 (×2): qty 1

## 2016-10-29 MED ORDER — CALCIUM CARBONATE-VITAMIN D 500-200 MG-UNIT PO TABS
1.0000 | ORAL_TABLET | Freq: Every day | ORAL | Status: DC
  Administered 2016-10-30: 21:00:00 1 via ORAL
  Filled 2016-10-29: qty 1

## 2016-10-29 MED ORDER — OXYCODONE HCL 5 MG/5ML PO SOLN: 5.0000 mg | Freq: Once | ORAL | Status: DC | PRN

## 2016-10-29 MED ORDER — AMLODIPINE BESYLATE 5 MG PO TABS
5.0000 mg | ORAL_TABLET | Freq: Every day | ORAL | Status: DC
  Administered 2016-10-30 – 2016-10-31 (×2): 5 mg via ORAL
  Filled 2016-10-29 (×4): qty 1

## 2016-10-29 MED ORDER — FENTANYL CITRATE (PF) 250 MCG/5ML IJ SOLN
INTRAMUSCULAR | Status: DC | PRN
  Administered 2016-10-29: 25 ug via INTRAVENOUS
  Administered 2016-10-29: 50 ug via INTRAVENOUS

## 2016-10-29 MED ORDER — ASPIRIN EC 325 MG PO TBEC
325.0000 mg | DELAYED_RELEASE_TABLET | Freq: Every day | ORAL | Status: DC
  Administered 2016-10-30 – 2016-10-31 (×2): 325 mg via ORAL
  Filled 2016-10-29 (×2): qty 1

## 2016-10-29 MED ORDER — ACETAMINOPHEN 325 MG PO TABS
650.0000 mg | ORAL_TABLET | Freq: Four times a day (QID) | ORAL | Status: DC | PRN
  Administered 2016-10-29 – 2016-10-31 (×5): 650 mg via ORAL
  Filled 2016-10-29 (×6): qty 2

## 2016-10-29 MED ORDER — PROPOFOL 10 MG/ML IV BOLUS
INTRAVENOUS | Status: DC | PRN
  Administered 2016-10-29: 150 mg via INTRAVENOUS
  Administered 2016-10-29: 50 mg via INTRAVENOUS

## 2016-10-29 MED ORDER — METOCLOPRAMIDE HCL 5 MG PO TABS
5.0000 mg | ORAL_TABLET | Freq: Three times a day (TID) | ORAL | Status: DC | PRN
  Administered 2016-10-29: 10 mg via ORAL
  Filled 2016-10-29: qty 2

## 2016-10-29 MED ORDER — ACETAMINOPHEN 650 MG RE SUPP: 650.0000 mg | Freq: Four times a day (QID) | RECTAL | Status: DC | PRN

## 2016-10-29 MED ORDER — PHENYLEPHRINE HCL 10 MG/ML IJ SOLN: INTRAVENOUS | Status: DC | PRN

## 2016-10-29 MED ORDER — MENTHOL 3 MG MT LOZG: 1.0000 | LOZENGE | OROMUCOSAL | Status: DC | PRN

## 2016-10-29 MED ORDER — MORPHINE SULFATE (PF) 4 MG/ML IV SOLN
2.0000 mg | INTRAVENOUS | Status: DC | PRN
  Administered 2016-10-29: 2 mg via INTRAVENOUS
  Filled 2016-10-29: qty 1

## 2016-10-29 MED ORDER — PHENYLEPHRINE 40 MCG/ML (10ML) SYRINGE FOR IV PUSH (FOR BLOOD PRESSURE SUPPORT)
PREFILLED_SYRINGE | INTRAVENOUS | Status: AC
  Filled 2016-10-29: qty 10

## 2016-10-29 MED ORDER — ROCURONIUM BROMIDE 10 MG/ML (PF) SYRINGE
PREFILLED_SYRINGE | INTRAVENOUS | Status: DC | PRN
  Administered 2016-10-29: 50 mg via INTRAVENOUS

## 2016-10-29 MED ORDER — LEVOTHYROXINE SODIUM 125 MCG PO TABS
125.0000 ug | ORAL_TABLET | Freq: Every day | ORAL | Status: DC
  Administered 2016-10-30 – 2016-10-31 (×2): 125 ug via ORAL
  Filled 2016-10-29 (×2): qty 1

## 2016-10-29 MED ORDER — PHENOL 1.4 % MT LIQD: 1.0000 | OROMUCOSAL | Status: DC | PRN

## 2016-10-29 MED ORDER — ROPIVACAINE HCL 5 MG/ML IJ SOLN
INTRAMUSCULAR | Status: DC | PRN
  Administered 2016-10-29: 30 mL via PERINEURAL

## 2016-10-29 MED ORDER — VANCOMYCIN HCL 500 MG IV SOLR
INTRAVENOUS | Status: DC | PRN
  Administered 2016-10-29: 500 mg

## 2016-10-29 MED ORDER — AMLODIPINE BESYLATE-VALSARTAN 5-160 MG PO TABS: 1.0000 | ORAL_TABLET | Freq: Every day | ORAL | Status: DC

## 2016-10-29 MED ORDER — INSULIN ASPART 100 UNIT/ML ~~LOC~~ SOLN
0.0000 [IU] | Freq: Every day | SUBCUTANEOUS | Status: DC
  Administered 2016-10-29 – 2016-10-30 (×2): 2 [IU] via SUBCUTANEOUS

## 2016-10-29 MED ORDER — LACTATED RINGERS IV SOLN
INTRAVENOUS | Status: DC | PRN
  Administered 2016-10-29: 07:00:00 via INTRAVENOUS

## 2016-10-29 MED ORDER — GLIMEPIRIDE 2 MG PO TABS
4.0000 mg | ORAL_TABLET | Freq: Every day | ORAL | Status: DC
  Administered 2016-10-30 – 2016-10-31 (×2): 4 mg via ORAL
  Filled 2016-10-29 (×2): qty 2

## 2016-10-29 MED ORDER — OXYCODONE HCL 5 MG PO TABS
5.0000 mg | ORAL_TABLET | ORAL | Status: DC | PRN
  Administered 2016-10-29 – 2016-10-31 (×9): 10 mg via ORAL
  Filled 2016-10-29 (×10): qty 2

## 2016-10-29 MED ORDER — TRANEXAMIC ACID 1000 MG/10ML IV SOLN
1000.0000 mg | INTRAVENOUS | Status: AC
  Administered 2016-10-29: 1000 mg via INTRAVENOUS
  Filled 2016-10-29: qty 1100

## 2016-10-29 MED ORDER — ADULT MULTIVITAMIN W/MINERALS CH
1.0000 | ORAL_TABLET | Freq: Every day | ORAL | Status: DC
  Administered 2016-10-30: 1 via ORAL
  Filled 2016-10-29: qty 1

## 2016-10-29 MED ORDER — PROMETHAZINE HCL 25 MG/ML IJ SOLN: 6.2500 mg | INTRAMUSCULAR | Status: DC | PRN

## 2016-10-29 MED ORDER — EPHEDRINE 5 MG/ML INJ
INTRAVENOUS | Status: AC
  Filled 2016-10-29: qty 10

## 2016-10-29 MED ORDER — PHENYLEPHRINE HCL 10 MG/ML IJ SOLN
INTRAVENOUS | Status: DC | PRN
  Administered 2016-10-29: 50 ug/min via INTRAVENOUS

## 2016-10-29 MED ORDER — ONDANSETRON HCL 4 MG/2ML IJ SOLN
INTRAMUSCULAR | Status: AC
  Filled 2016-10-29: qty 2

## 2016-10-29 MED ORDER — OXYCODONE HCL 5 MG PO TABS: 5.0000 mg | ORAL_TABLET | Freq: Once | ORAL | Status: DC | PRN

## 2016-10-29 MED ORDER — MIDAZOLAM HCL 2 MG/2ML IJ SOLN
INTRAMUSCULAR | Status: AC
  Filled 2016-10-29: qty 2

## 2016-10-29 MED ORDER — INFLUENZA VAC SPLIT HIGH-DOSE 0.5 ML IM SUSY
0.5000 mL | PREFILLED_SYRINGE | INTRAMUSCULAR | Status: AC
  Administered 2016-10-30: 0.5 mL via INTRAMUSCULAR
  Filled 2016-10-29: qty 0.5

## 2016-10-29 MED ORDER — ONDANSETRON HCL 4 MG/2ML IJ SOLN: 4.0000 mg | Freq: Four times a day (QID) | INTRAMUSCULAR | Status: DC | PRN

## 2016-10-29 MED ORDER — KETOTIFEN FUMARATE 0.025 % OP SOLN: 1.0000 [drp] | Freq: Two times a day (BID) | OPHTHALMIC | Status: DC | PRN

## 2016-10-29 MED ORDER — SODIUM CHLORIDE 0.9 % IR SOLN
Status: DC | PRN
Start: 2016-10-29 — End: 2016-10-29
  Administered 2016-10-29: 3000 mL

## 2016-10-29 MED ORDER — CHLORHEXIDINE GLUCONATE 4 % EX LIQD: 60.0000 mL | Freq: Once | CUTANEOUS | Status: DC

## 2016-10-29 MED ORDER — FENTANYL CITRATE (PF) 250 MCG/5ML IJ SOLN
INTRAMUSCULAR | Status: AC
  Filled 2016-10-29: qty 5

## 2016-10-29 MED ORDER — CHLORHEXIDINE GLUCONATE 4 % EX LIQD
60.0000 mL | Freq: Once | CUTANEOUS | Status: DC
Start: 2016-10-29 — End: 2016-10-29

## 2016-10-29 MED ORDER — ROCURONIUM BROMIDE 10 MG/ML (PF) SYRINGE
PREFILLED_SYRINGE | INTRAVENOUS | Status: AC
  Filled 2016-10-29: qty 5

## 2016-10-29 MED ORDER — ONDANSETRON HCL 4 MG/2ML IJ SOLN
INTRAMUSCULAR | Status: DC | PRN
  Administered 2016-10-29: 4 mg via INTRAVENOUS

## 2016-10-29 MED ORDER — SUCCINYLCHOLINE CHLORIDE 200 MG/10ML IV SOSY
PREFILLED_SYRINGE | INTRAVENOUS | Status: AC
  Filled 2016-10-29: qty 10

## 2016-10-29 MED ORDER — BUPIVACAINE-EPINEPHRINE (PF) 0.5% -1:200000 IJ SOLN
INTRAMUSCULAR | Status: AC
  Filled 2016-10-29: qty 30

## 2016-10-29 MED ORDER — INSULIN ASPART 100 UNIT/ML ~~LOC~~ SOLN
0.0000 [IU] | Freq: Three times a day (TID) | SUBCUTANEOUS | Status: DC
  Administered 2016-10-30 (×3): 5 [IU] via SUBCUTANEOUS
  Administered 2016-10-31: 3 [IU] via SUBCUTANEOUS

## 2016-10-29 MED ORDER — ONDANSETRON HCL 4 MG PO TABS: 4.0000 mg | ORAL_TABLET | Freq: Four times a day (QID) | ORAL | Status: DC | PRN

## 2016-10-29 MED ORDER — POTASSIUM CHLORIDE IN NACL 20-0.9 MEQ/L-% IV SOLN: INTRAVENOUS | Status: AC

## 2016-10-29 MED ORDER — LIDOCAINE 2% (20 MG/ML) 5 ML SYRINGE
INTRAMUSCULAR | Status: AC
  Filled 2016-10-29: qty 5

## 2016-10-29 MED ORDER — ECONAZOLE NITRATE 1 % EX CREA: 1.0000 "application " | TOPICAL_CREAM | Freq: Every day | CUTANEOUS | Status: DC

## 2016-10-29 MED ORDER — LIDOCAINE 2% (20 MG/ML) 5 ML SYRINGE
INTRAMUSCULAR | Status: DC | PRN
  Administered 2016-10-29: 60 mg via INTRAVENOUS

## 2016-10-29 MED ORDER — PROPOFOL 10 MG/ML IV BOLUS
INTRAVENOUS | Status: AC
  Filled 2016-10-29: qty 20

## 2016-10-29 MED ORDER — METOCLOPRAMIDE HCL 5 MG/ML IJ SOLN: 5.0000 mg | Freq: Three times a day (TID) | INTRAMUSCULAR | Status: DC | PRN

## 2016-10-29 MED ORDER — SUGAMMADEX SODIUM 200 MG/2ML IV SOLN
INTRAVENOUS | Status: DC | PRN
  Administered 2016-10-29: 100 mg via INTRAVENOUS

## 2016-10-29 MED ORDER — TRIAMTERENE-HCTZ 37.5-25 MG PO CAPS
1.0000 | ORAL_CAPSULE | Freq: Every day | ORAL | Status: DC | PRN
  Filled 2016-10-29: qty 1

## 2016-10-29 MED ORDER — TRANEXAMIC ACID 1000 MG/10ML IV SOLN
2000.0000 mg | INTRAVENOUS | Status: AC
  Administered 2016-10-29: 2000 mg via TOPICAL
  Filled 2016-10-29: qty 20

## 2016-10-29 MED ORDER — LINAGLIPTIN 5 MG PO TABS
5.0000 mg | ORAL_TABLET | Freq: Every day | ORAL | Status: DC
  Administered 2016-10-30 – 2016-10-31 (×2): 5 mg via ORAL
  Filled 2016-10-29 (×3): qty 1

## 2016-10-29 MED ORDER — 0.9 % SODIUM CHLORIDE (POUR BTL) OPTIME
TOPICAL | Status: DC | PRN
  Administered 2016-10-29: 1000 mL

## 2016-10-29 MED ORDER — VANCOMYCIN HCL 500 MG IV SOLR
INTRAVENOUS | Status: AC
  Filled 2016-10-29: qty 500

## 2016-10-29 MED ORDER — HYDROMORPHONE HCL 1 MG/ML IJ SOLN: 0.2500 mg | INTRAMUSCULAR | Status: DC | PRN

## 2016-10-29 SURGICAL SUPPLY — 69 items
AID PSTN UNV HD RSTRNT DISP (MISCELLANEOUS) ×1
BASEPLATE GLENOID SHLDR SM (Shoulder) ×1 IMPLANT
BLADE SAW SGTL 13X75X1.27 (BLADE) ×2 IMPLANT
BSPLAT GLND SM PRFT SHLDR CA (Shoulder) ×1 IMPLANT
CALIBRATOR GLENOID VIP 5-D (SYSTAGENIX WOUND MANAGEMENT) ×1 IMPLANT
CEMENT BONE DEPUY (Cement) IMPLANT
COVER SURGICAL LIGHT HANDLE (MISCELLANEOUS) ×2 IMPLANT
CUP SUT UNIV REVERS 36+2 RT (Cup) ×1 IMPLANT
DRAPE INCISE IOBAN 66X45 STRL (DRAPES) ×4 IMPLANT
DRAPE U-SHAPE 47X51 STRL (DRAPES) ×4 IMPLANT
DRSG AQUACEL AG ADV 3.5X10 (GAUZE/BANDAGES/DRESSINGS) ×2 IMPLANT
DRSG MEPILEX BORDER 4X8 (GAUZE/BANDAGES/DRESSINGS) ×1 IMPLANT
DURAPREP 26ML APPLICATOR (WOUND CARE) ×4 IMPLANT
ELECT BLADE 4.0 EZ CLEAN MEGAD (MISCELLANEOUS) ×2
ELECT REM PT RETURN 9FT ADLT (ELECTROSURGICAL) ×2
ELECTRODE BLDE 4.0 EZ CLN MEGD (MISCELLANEOUS) IMPLANT
ELECTRODE REM PT RTRN 9FT ADLT (ELECTROSURGICAL) ×1 IMPLANT
GLENOSPHERE LATERAL 36MM+4 (Shoulder) ×1 IMPLANT
GLOVE BIOGEL PI IND STRL 7.5 (GLOVE) ×1 IMPLANT
GLOVE BIOGEL PI IND STRL 8 (GLOVE) ×1 IMPLANT
GLOVE BIOGEL PI INDICATOR 7.5 (GLOVE) ×1
GLOVE BIOGEL PI INDICATOR 8 (GLOVE) ×1
GLOVE ECLIPSE 7.0 STRL STRAW (GLOVE) ×2 IMPLANT
GLOVE SURG ORTHO 8.0 STRL STRW (GLOVE) ×2 IMPLANT
GOWN STRL REUS W/ TWL LRG LVL3 (GOWN DISPOSABLE) ×2 IMPLANT
GOWN STRL REUS W/ TWL XL LVL3 (GOWN DISPOSABLE) ×1 IMPLANT
GOWN STRL REUS W/TWL LRG LVL3 (GOWN DISPOSABLE) ×4
GOWN STRL REUS W/TWL XL LVL3 (GOWN DISPOSABLE) ×2
KIT BASIN OR (CUSTOM PROCEDURE TRAY) ×2 IMPLANT
KIT BEACH CHAIR TRIMANO (MISCELLANEOUS) ×2 IMPLANT
KIT ROOM TURNOVER OR (KITS) ×2 IMPLANT
LINER HUMERAL 36 +3MM SM (Shoulder) ×1 IMPLANT
MANIFOLD NEPTUNE II (INSTRUMENTS) ×2 IMPLANT
NDL HYPO 25GX1X1/2 BEV (NEEDLE) IMPLANT
NDL SUT 6 .5 CRC .975X.05 MAYO (NEEDLE) ×1 IMPLANT
NEEDLE HYPO 25GX1X1/2 BEV (NEEDLE) IMPLANT
NEEDLE MAYO TAPER (NEEDLE) ×2
NS IRRIG 1000ML POUR BTL (IV SOLUTION) ×2 IMPLANT
PACK SHOULDER (CUSTOM PROCEDURE TRAY) ×2 IMPLANT
PAD ARMBOARD 7.5X6 YLW CONV (MISCELLANEOUS) ×4 IMPLANT
PASSER SUT SWANSON 36MM LOOP (INSTRUMENTS) ×1 IMPLANT
RESTRAINT HEAD UNIVERSAL NS (MISCELLANEOUS) ×2 IMPLANT
SCREW LOCK GLENOID UNI PERI (Screw) ×1 IMPLANT
SCREW LOCK PERIPHERAL 36MM (Screw) ×1 IMPLANT
SCREW NON LOCK 6.5X15 (Shoulder) ×1 IMPLANT
SET PIN UNIVERSAL REVERSE (SET/KITS/TRAYS/PACK) ×1 IMPLANT
SLING ARM IMMOBILIZER LRG (SOFTGOODS) ×1 IMPLANT
SPACER SHLD UNI REV 36 +6 (Shoulder) ×1 IMPLANT
SPONGE LAP 18X18 X RAY DECT (DISPOSABLE) ×2 IMPLANT
STEM HUMERAL UNI REVERS SZ6 (Stem) ×1 IMPLANT
STRIP CLOSURE SKIN 1/2X4 (GAUZE/BANDAGES/DRESSINGS) ×2 IMPLANT
SUCTION FRAZIER HANDLE 10FR (MISCELLANEOUS) ×1
SUCTION TUBE FRAZIER 10FR DISP (MISCELLANEOUS) ×1 IMPLANT
SUT FIBERWIRE #2 38 T-5 BLUE (SUTURE) ×2
SUT MAXBRAID (SUTURE) IMPLANT
SUT MNCRL AB 3-0 PS2 18 (SUTURE) ×2 IMPLANT
SUT VIC AB 0 CT1 27 (SUTURE) ×4
SUT VIC AB 0 CT1 27XBRD ANBCTR (SUTURE) ×2 IMPLANT
SUT VIC AB 1 CT1 27 (SUTURE) ×2
SUT VIC AB 1 CT1 27XBRD ANBCTR (SUTURE) ×1 IMPLANT
SUT VIC AB 2-0 CT1 27 (SUTURE) ×4
SUT VIC AB 2-0 CT1 TAPERPNT 27 (SUTURE) ×2 IMPLANT
SUT VICRYL 0 UR6 27IN ABS (SUTURE) ×4 IMPLANT
SUTURE FIBERWR #2 38 T-5 BLUE (SUTURE) ×1 IMPLANT
SYR CONTROL 10ML LL (SYRINGE) ×1 IMPLANT
TOWEL OR 17X24 6PK STRL BLUE (TOWEL DISPOSABLE) ×2 IMPLANT
TOWEL OR 17X26 10 PK STRL BLUE (TOWEL DISPOSABLE) ×2 IMPLANT
TRAY FOLEY BAG SILVER LF 16FR (CATHETERS) IMPLANT
WATER STERILE IRR 1000ML POUR (IV SOLUTION) ×2 IMPLANT

## 2016-10-29 NOTE — Brief Op Note (Signed)
10/29/2016  11:32 AM  PATIENT:  Maryann Alar Perdomo  82 y.o. female  PRE-OPERATIVE DIAGNOSIS:  RIGHT SHOULDER OSTEOARTHRITIS  POST-OPERATIVE DIAGNOSIS:  RIGHT SHOULDER OSTEOARTHRITIS  PROCEDURE:  Procedure(s): RIGHT REVERSE TOTAL SHOULDER  SURGEON:  Surgeon(s): Marlou Sa, Tonna Corner, MD  ASSISTANT: Laure Kidney RNFA  ANESTHESIA:   general  EBL: 250 ml    Total I/O In: 1000 [I.V.:1000] Out: 700 [Urine:400; Blood:300]  BLOOD ADMINISTERED: none  DRAINS: none   LOCAL MEDICATIONS USED:  none  SPECIMEN:  No Specimen  COUNTS:  YES  TOURNIQUET:  * No tourniquets in log *  DICTATION: .Other Dictation: Dictation Number [id number]  PLAN OF CARE: Admit to inpatient   PATIENT DISPOSITION:  PACU - hemodynamically stable

## 2016-10-29 NOTE — Anesthesia Procedure Notes (Signed)
Procedure Name: Intubation Date/Time: 10/29/2016 7:45 AM Performed by: Julieta Bellini Pre-anesthesia Checklist: Patient identified, Emergency Drugs available, Suction available and Patient being monitored Patient Re-evaluated:Patient Re-evaluated prior to induction Oxygen Delivery Method: Circle system utilized Preoxygenation: Pre-oxygenation with 100% oxygen Induction Type: IV induction Ventilation: Mask ventilation without difficulty and Oral airway inserted - appropriate to patient size Laryngoscope Size: Mac Grade View: Grade I Tube type: Oral Tube size: 7.0 mm Number of attempts: 1 Airway Equipment and Method: Stylet Placement Confirmation: ETT inserted through vocal cords under direct vision,  positive ETCO2 and breath sounds checked- equal and bilateral Secured at: 20 cm Tube secured with: Tape Dental Injury: Teeth and Oropharynx as per pre-operative assessment

## 2016-10-29 NOTE — Anesthesia Postprocedure Evaluation (Signed)
Anesthesia Post Note  [name]  Procedure(s) Performed: RIGHT REVERSE TOTAL SHOULDER (Right Shoulder)     Patient location during evaluation: PACU Anesthesia Type: Regional and General Level of consciousness: awake and alert Pain management: pain level controlled Vital Signs Assessment: post-procedure vital signs reviewed and stable Respiratory status: spontaneous breathing, nonlabored ventilation and respiratory function stable Cardiovascular status: blood pressure returned to baseline and stable Postop Assessment: no apparent nausea or vomiting Anesthetic complications: no    Last Vitals:  Vitals:   10/29/16 1244 10/29/16 1320  BP:  (!) 157/65  Pulse: 75 79  Resp: 18 18  Temp:  (!) 36.2 C  SpO2: 96% 95%    Last Pain:  Vitals:   10/29/16 1320  TempSrc:   PainSc: 0-No pain                 Lynda Rainwater

## 2016-10-29 NOTE — Anesthesia Procedure Notes (Signed)
Anesthesia Regional Block: Interscalene brachial plexus block   Pre-Anesthetic Checklist: ,, timeout performed, Correct Patient, Correct Site, Correct Laterality, Correct Procedure, Correct Position, site marked, Risks and benefits discussed,  Surgical consent,  Pre-op evaluation,  At surgeon's request and post-op pain management  Laterality: Right  Prep: chloraprep       Needles:  Injection technique: Single-shot  Needle Type: Stimiplex     Needle Length: 9cm  Needle Gauge: 21     Additional Needles:   Procedures:,,,, ultrasound used (permanent image in chart),,,,  Narrative:  Start time: 10/29/2016 7:17 AM End time: 10/29/2016 7:22 AM Injection made incrementally with aspirations every 5 mL.  Performed by: Personally  Anesthesiologist: Candida Peeling RAY

## 2016-10-29 NOTE — H&P (Signed)
Megan [name] is an 82 y.o. female.   Chief Complaint: Right shoulder pain HPI: Megan [name] is an 82 year old female with right shoulder pain and arthritis.  She underwent left shoulder replacement with reverse shoulder arthroplasty 2 years ago and did well with that.  This was for nonunion of humeral fracture.  She now presents with similar symptoms on the right-hand side but with a main complaint of arthritis and pain.  She has failed nonoperative management.  Past Medical History:  Diagnosis Date  . Arthritis   . Cancer ([place]) 1989   melanoma  . Diabetes mellitus without complication ([place])   . Dizziness   . Dyspnea   . GERD (gastroesophageal reflux disease)   . Heart murmur   . History of hiatal hernia   . Hypertension   . Hypothyroidism     Past Surgical History:  Procedure Laterality Date  . APPENDECTOMY     82 yrs old  . CHOLECYSTECTOMY  89  . EYE SURGERY Bilateral 2017   cataract surgery  . JOINT REPLACEMENT Right    knee   . REVERSE SHOULDER ARTHROPLASTY Left 07/14/2014  . REVERSE SHOULDER ARTHROPLASTY Left 07/14/2014   Procedure: LEFT REVERSE SHOULDER ARTHROPLASTY;  Surgeon: Meredith Pel, MD;  Location: [facility];  Service: Orthopedics;  Laterality: Left;  . SHOULDER ARTHROSCOPY Left     History reviewed. No pertinent family history. Social History:  reports that she has never smoked. She has never used smokeless tobacco. She reports that she does not drink alcohol or use drugs.  Allergies: No Known Allergies  Medications Prior to Admission  Medication Sig Dispense Refill  . ACCU-CHEK AVIVA PLUS test strip 1 each by Other route 2 (two) times daily.     Marland Kitchen ACCU-CHEK SOFTCLIX LANCETS lancets 1 each by Other route 2 (two) times daily.     Marland Kitchen amLODipine-valsartan (EXFORGE) 5-160 MG per tablet Take 1 tablet by mouth daily.   12  . aspirin EC 325 MG EC tablet Take 1 tablet (325 mg total) by mouth daily. (Patient taking differently: Take 325 mg by mouth daily after breakfast. )  30 tablet 0  . calcium-vitamin D (OSCAL WITH D) 500-200 MG-UNIT tablet Take 1 tablet by mouth at bedtime.    . clopidogrel (PLAVIX) 75 MG tablet Take 75 mg by mouth at bedtime.     Marland Kitchen econazole nitrate 1 % cream Apply 1 application topically daily. For nose/corners of mouth    . glimepiride (AMARYL) 2 MG tablet Take 4 mg by mouth daily with breakfast.   12  . hydrocortisone 2.5 % cream Apply 1 application topically daily. Applied to nose/corners of mouth    . ketotifen (ZADITOR) 0.025 % ophthalmic solution Place 1 drop into both eyes 2 (two) times daily as needed (for itchy eyes.).    Marland Kitchen Multiple Vitamin (MULTIVITAMIN WITH MINERALS) TABS tablet Take 1 tablet by mouth at bedtime.    Marland Kitchen MYRBETRIQ 50 MG TB24 tablet Take 50 mg by mouth daily.  11  . Omega-3 Fatty Acids (FISH OIL) 1200 MG CAPS Take 1,200 mg by mouth daily with breakfast.     . Polyvinyl Alcohol-Povidone (REFRESH OP) Place 1 drop into both eyes 3 (three) times daily as needed (dry eyes).     . ranitidine (ZANTAC) 150 MG tablet Take 150 mg by mouth 2 (two) times daily.     . sitaGLIPtin (JANUVIA) 100 MG tablet Take 100 mg by mouth daily.    Marland Kitchen SYNTHROID 125 MCG tablet Take 125  mcg by mouth daily before breakfast.   12  . triamterene-hydrochlorothiazide (DYAZIDE) 37.5-25 MG per capsule Take 1 capsule by mouth daily as needed (swelling).   11  . naproxen sodium (ANAPROX) 220 MG tablet Take 220 mg by mouth 2 (two) times daily.      Results for orders placed or performed during the hospital encounter of 10/29/16 (from the past 48 hour(s))  Glucose, capillary     Status: Abnormal   Collection Time: 10/29/16  6:30 AM  Result Value Ref Range   Glucose-Capillary 180 (H) 65 - 99 mg/dL   No results found.  Review of Systems  Musculoskeletal: Positive for joint pain.  All other systems reviewed and are negative.   Blood pressure (!) 141/71, pulse 79, temperature (!) 97 F (36.1 C), temperature source Oral, resp. rate 18, height 5\' 4"   (1.626 m), weight 215 lb (97.5 kg), SpO2 99 %. Physical Exam  Constitutional: She appears well-developed.  HENT:  Head: Normocephalic.  Eyes: Pupils are equal, round, and reactive to light.  Neck: Normal range of motion.  Cardiovascular: Normal rate.   Respiratory: Effort normal.  Neurological: She is alert.  Skin: Skin is warm.  Psychiatric: She has a normal mood and affect.  Examination the right shoulder demonstrates crepitus with range of motion with deficient rotator cuff strength and coarseness and grinding with active range of motion.  Skin is intact in the shoulder region.  No other masses lymph adenopathy or skin changes noted in the shoulder region.  Motor sensory function to the hand is intact.   Assessment/Plan Impression is right shoulder pain with arthritis and early cuff arthropathy.  Plan is right reverse shoulder replacement.  Risks and benefits discussed including not limited to infection or vessel damage limited motion dislocation soles potential need for revision surgery.  All questions answered.  Anderson Malta, MD 10/29/2016, 7:23 AM

## 2016-10-29 NOTE — Anesthesia Preprocedure Evaluation (Signed)
Anesthesia Evaluation  Patient identified by MRN, date of birth, ID band Patient awake    Reviewed: Allergy & Precautions, NPO status   Airway Mallampati: II  TM Distance: >3 FB Neck ROM: Full    Dental no notable dental hx.    Pulmonary neg pulmonary ROS,    Pulmonary exam normal breath sounds clear to auscultation       Cardiovascular hypertension, Normal cardiovascular exam Rhythm:Regular Rate:Normal     Neuro/Psych    GI/Hepatic Neg liver ROS, hiatal hernia, GERD  ,  Endo/Other  diabetesHypothyroidism   Renal/GU negative Renal ROS     Musculoskeletal  (+) Arthritis ,   Abdominal   Peds  Hematology   Anesthesia Other Findings   Reproductive/Obstetrics                             Anesthesia Physical  Anesthesia Plan  ASA: II  Anesthesia Plan: General and Regional   Post-op Pain Management: GA combined w/ Regional for post-op pain   Induction:   PONV Risk Score and Plan: 3 and Ondansetron, Dexamethasone and Midazolam  Airway Management Planned: Oral ETT  Additional Equipment:   Intra-op Plan:   Post-operative Plan:   Informed Consent: I have reviewed the patients History and Physical, chart, labs and discussed the procedure including the risks, benefits and alternatives for the proposed anesthesia with the patient or authorized representative who has indicated his/her understanding and acceptance.   Dental advisory given  Plan Discussed with:   Anesthesia Plan Comments:         Anesthesia Quick Evaluation

## 2016-10-29 NOTE — Transfer of Care (Signed)
Immediate Anesthesia Transfer of Care Note  [name]  Procedure(s) Performed: RIGHT REVERSE TOTAL SHOULDER (Right Shoulder)  Patient Location: PACU  Anesthesia Type:General and GA combined with regional for post-op pain  Level of Consciousness: drowsy and patient cooperative  Airway & Oxygen Therapy: Patient Spontanous Breathing and Patient connected to nasal cannula oxygen  Post-op Assessment: Report given to RN, Post -op Vital signs reviewed and stable and Patient moving all extremities X 4  Post vital signs: Reviewed and stable  Last Vitals:  Vitals:   10/29/16 0724 10/29/16 1139  BP:    Pulse: 73   Resp:    Temp:  (P) 36.4 C  SpO2: 100%     Last Pain:  Vitals:   10/29/16 1139  TempSrc:   PainSc: (P) 0-No pain         Complications: No apparent anesthesia complications

## 2016-10-30 ENCOUNTER — Encounter (HOSPITAL_COMMUNITY): Payer: Self-pay | Admitting: Orthopedic Surgery

## 2016-10-30 LAB — GLUCOSE, CAPILLARY
GLUCOSE-CAPILLARY: 237 mg/dL — AB (ref 65–99)
GLUCOSE-CAPILLARY: 240 mg/dL — AB (ref 65–99)
Glucose-Capillary: 219 mg/dL — ABNORMAL HIGH (ref 65–99)
Glucose-Capillary: 213 mg/dL — ABNORMAL HIGH (ref 65–99)

## 2016-10-30 NOTE — Progress Notes (Signed)
OT note for charges    10/30/16 1100  OT Time Calculation  OT Start Time (ACUTE ONLY) 1025  OT Stop Time (ACUTE ONLY) 1050  OT Time Calculation (min) 25 min  OT General Charges  $OT Visit 1 Visit  OT Evaluation  $OT Eval Moderate Complexity 1 Mod  OT Treatments  $Self Care/Home Management  8-22 mins   Amaury Kuzel A. Ulice Brilliant, M.S., OTR/L Pager: [phone]

## 2016-10-30 NOTE — Progress Notes (Signed)
Subjective: Pt stable - pain ok   Objective: Vital signs in last 24 hours: Temp:  [97.1 F (36.2 C)-99.9 F (37.7 C)] 98.4 F (36.9 C) (10/03 0319) Pulse Rate:  [73-105] 95 (10/03 0319) Resp:  [16-22] 16 (10/03 0319) BP: (109-162)/(49-89) 109/60 (10/03 0319) SpO2:  [92 %-100 %] 94 % (10/03 0319)  Intake/Output from previous day: 10/02 0701 - 10/03 0700 In: 2070 [P.O.:530; I.V.:1300; IV Piggyback:100] Out: 1650 [Urine:1350; Blood:300] Intake/Output this shift: No intake/output data recorded.  Exam:  Sensation intact distally Intact pulses distally  Labs: No results for input(s): HGB in the last 72 hours. No results for input(s): WBC, RBC, HCT, PLT in the last 72 hours. No results for input(s): NA, K, CL, CO2, BUN, CREATININE, GLUCOSE, CALCIUM in the last 72 hours. No results for input(s): LABPT, INR in the last 72 hours.  Assessment/Plan: Plan ot today - dc am   G Alphonzo Severance 10/30/2016, 7:14 AM

## 2016-10-30 NOTE — Therapy (Signed)
Occupational Therapy Treatment Patient Details Name: Megan [name] MRN: [id number] DOB: 05-09-1934 Today's Date: 10/30/2016    History of present illness Pt is an 82 y.o. female s/p right reverse shoulder revision. PMH: arthritis, cancer (melanoma), DM, dizziness, dyspnea, GERD, heart murmur, history of hiatal hernia, HTN, hypothyroidism. PSH: left reverse total shoulder replacement, right knee replacement.    OT comments  Focus of today's treatment session on education for wrist, hand and elbow flexion/extension AROM exercises and compensatory techniques for UE bathing/dressing. Pt reports plans to use a CPM for all other R shoulder exercises. Pt able to participate in all education, however, was  limited this session due to pain. Pt reports son will be living with the pt to assist as needed upon d/c. Plan to review all education tomorrow with son present.    Follow Up Recommendations  DC plan and follow up therapy as arranged by surgeon;Supervision/Assistance - 24 hour    Equipment Recommendations  None recommended by OT    Recommendations for Other Services      Precautions / Restrictions Precautions Precautions: Shoulder Type of Shoulder Precautions: Active Protocol Shoulder Interventions: Shoulder sling/immobilizer;Off for dressing/bathing/exercises Precaution Booklet Issued: Yes (comment) Precaution Comments: Reviewed shoulder precautions with pt  Required Braces or Orthoses: Sling Restrictions Weight Bearing Restrictions: Yes RUE Weight Bearing: Non weight bearing   Active Shoulder Protocol:  AROM elbow, wrist, hand OK. AROM/PROM forward flexion: 0-90. AROM/PROM abduction: 0-60. AROM/PROM external rotation 0-30.      Mobility Bed Mobility               General bed mobility comments: Pt sitting in chair upon OT arrival.  Transfers                      Balance Overall balance assessment: Needs assistance Sitting-balance support: Single extremity  supported;Feet supported Sitting balance-Leahy Scale: Good                                     ADL either performed or assessed with clinical judgement   ADL Overall ADL's : Needs assistance/impaired                 Upper Body Dressing : Moderate assistance;Sitting Upper Body Dressing Details (indicate cue type and reason): Mod assist to don/doff sling.                   General ADL Comments: Educated pt on compensatory  techniques for UB bathing and dressing with shoulder precautions. Pt unable to return demonstration due to pain. Will continue to review in future sessions.      Vision       Perception     Praxis      Cognition Arousal/Alertness: Awake/alert Behavior During Therapy: WFL for tasks assessed/performed Overall Cognitive Status: Within Functional Limits for tasks assessed                                          Exercises Exercises: Shoulder Shoulder Exercises Elbow Flexion: AAROM;Seated (2 reps due to pain) Elbow Extension: AAROM;Seated (2 reps due to pain) Wrist Flexion: AROM;10 reps;Seated Wrist Extension: AROM;10 reps;Seated Digit Composite Flexion: AROM;10 reps;Seated Composite Extension: AROM;10 reps;Seated   Shoulder Instructions Shoulder Instructions Donning/doffing shirt without moving shoulder: Minimal assistance Method for sponge bathing under  operated UE: Minimal assistance Donning/doffing sling/immobilizer: Moderate assistance Correct positioning of sling/immobilizer: Moderate assistance ROM for elbow, wrist and digits of operated UE: Minimal assistance Sling wearing schedule (on at all times/off for ADL's): Minimal assistance Proper positioning of operated UE when showering: Minimal assistance Positioning of UE while sleeping: Minimal assistance     General Comments Verbal cues to remind pt of NWB status when sling was removed for exercises. Pt reports son will be by tomorrow for d/c. Planning  to see pt with son present for education.    Pertinent Vitals/ Pain       Pain Assessment: Faces Faces Pain Scale: Hurts even more (with movement) Pain Location: Right shoulder Pain Descriptors / Indicators: Operative site guarding;Sore Pain Intervention(s): Limited activity within patient's tolerance;Monitored during session  Home Living                                          Prior Functioning/Environment              Frequency  Min 2X/week        Progress Toward Goals  OT Goals(current goals can now be found in the care plan section)  Progress towards OT goals: Progressing toward goals  Acute Rehab OT Goals Patient Stated Goal: To go home OT Goal Formulation: With patient Time For Goal Achievement: 11/13/16 Potential to Achieve Goals: Good ADL Goals Pt Will Perform Upper Body Bathing: with modified independence;sitting Pt Will Perform Upper Body Dressing: with modified independence;sitting Pt Will Transfer to Toilet: with modified independence;ambulating;regular height toilet Pt Will Perform Tub/Shower Transfer: with modified independence;ambulating;shower seat Pt/caregiver will Perform Home Exercise Program: Increased ROM;Right Upper extremity;Independently;With written HEP provided  Plan Discharge plan remains appropriate    Co-evaluation                 AM-PAC PT "6 Clicks" Daily Activity     Outcome Measure   Help from another person eating meals?: None Help from another person taking care of personal grooming?: None Help from another person toileting, which includes using toliet, bedpan, or urinal?: None Help from another person bathing (including washing, rinsing, drying)?: A Little Help from another person to put on and taking off regular upper body clothing?: A Little Help from another person to put on and taking off regular lower body clothing?: A Little 6 Click Score: 21    End of Session Equipment Utilized During  Treatment:  (Sling)  OT Visit Diagnosis: Pain;Unsteadiness on feet (R26.81) Pain - Right/Left: Right Pain - part of body: Shoulder   Activity Tolerance Patient tolerated treatment well;Patient limited by pain   Patient Left in chair;with call bell/phone within reach   Nurse Communication Mobility status        Time: 6606-3016 OT Time Calculation (min): 16 min  Charges: OT General Charges $OT Visit: 1 Visit OT Treatments $Therapeutic Exercise: 8-22 mins  Boykin Peek, [place] #[phone]    Boykin Peek 10/30/2016, 4:42 PM

## 2016-10-30 NOTE — Op Note (Signed)
NAMEMARDEE, [name] NO.:  [id number]  MEDICAL RECORD NO.:  [id number]  LOCATION:                                 FACILITY:  PHYSICIAN:  Megan [name], M.D.         DATE OF BIRTH:  DATE OF PROCEDURE:  10/29/2016 DATE OF DISCHARGE:                              OPERATIVE REPORT   PREOPERATIVE DIAGNOSIS:  Right shoulder arthritis.  POSTOPERATIVE DIAGNOSIS:  Right shoulder arthritis.  PROCEDURE:  Right reverse shoulder replacement using Arthrex components, size 7 press-fit stem, +9 spacer, small +3 lateralized glenosphere, 42 mm, and a small base plate.  PROCEDURE IN DETAIL:  The patient was brought to the operating room where general endotracheal anesthesia was induced.  Preoperative antibiotics were administered.  Time-out was called.  The patient was placed in the beach-chair position with the head in neutral position. The patient is 82 year old patient with end-stage right shoulder arthritis.  She did well with left reverse shoulder replacement, presents now for right reverse shoulder replacement.  After induction of anesthetic and positioning, the patient's right arm was prescrubbed with alcohol and Betadine, allowed to air dry, prepped with DuraPrep solution and draped in a sterile manner.  The patient had about 10 degrees of external rotation and 15 degrees of abduction, and about 60 degrees of isolated glenohumeral abduction.  Significant restriction on range of motion was present.  At this time, after sterile prepping and draping, the operative field was covered with Ioban.  Deltopectoral approach was made.  Cephalic vein mobilized medially.  The subdeltoid adhesions were released.  Biceps tendon was identified and the rotator interval was opened up all the way to the base of the coracoid.  Biceps tendon was tenodesed to the pectoralis major tendon, which was released about a cm. The deltoid was also released off its anterior half attachment of  the deltoid in order to facilitate exposure.  At this time, the circumflex vessels were mobilized and dissected and tied off.  Axillary nerve was then identified and a vessel loop was placed around it.  It was protected at all times during the remaining portion of the case.  Next, the subscapularis was detached along with the inferior capsule about 2 cm inferior to the anatomic neck.  Progressive external rotation was performed as the subscapularis and capsule were detached in one layer. The head cut was then made in 30 degrees of retroversion.  Part of the supraspinatus was also released to facilitate exposure.  Following release, the head cut was made.  This was made initially about 2 mm anterior to the articular surface rotator cuff junction and then, it was taken back to the actual junction with 2 more mm cut.  The anterior capsule was released.  This was performed with protection of the axillary nerve.  At this time, after the cut was made on the humeral head, cap was placed and attention was directed towards the glenosphere. Guide was placed in accordance with the patient's specific positioning and implant positioning.  At this time, the area was drilled.  Reaming was then performed.  Good circle was achieved.  Slight inferior inclination was achieved.  At this time, small glenoid base plate was placed.  Good fixation was achieved with the central compression screw and the two superior and inferior locking screws.  Following this, attention was redirected towards the humeral shaft.  Broaching was performed and a very good press-fit was achieved with a 7 stem.  At this time, cheese grater reaming was performed and the true stem was placed. True glenosphere then placed into the prepared Morse taper.  Glenosphere seated well.  At this time, the trialing was performed and with the +9 spacer in position, the patient had very good stability and good range of motion with head being able to  go up on top of the head as well as no flexion contracture with adduction.  Conjoint tendon under appropriate tension.  True components then placed.  Same stability parameters maintained.  Thorough irrigation then performed.  Following thorough irrigation, vanc powder was placed inside the joint extra-articularly. The subscapularis was then reattached through fin holes in the prosthesis.  This was done in 30 degrees of external rotation. Supraspinatus also reattached without undue tension.  Rotator interval closed again in 30 degrees of external rotation.  At this time, thorough irrigation was again performed and the deltopectoral interval was closed using #1 Vicryl suture followed by interrupted inverted 0 Vicryl suture, 2-0 Vicryl suture, and 3-0 Monocryl.  Aquacel dressing placed.  The patient tolerated the procedure well without immediate complications, placed in a shoulder immobilizer and taken to the recovery room.     Megan [name], M.D.   ______________________________ Megan [name]. Megan [name], M.D.    GSD/MEDQ  D:  10/29/2016  T:  10/30/2016  Job:  [id number]

## 2016-10-30 NOTE — Evaluation (Signed)
Physical Therapy Evaluation Patient Details Name: Megan [name] MRN: [id number] DOB: [birth year] Today's Date: 10/30/2016   History of Present Illness  Pt is an 82 y.o. female s/p right reverse shoulder revision. PMH: arthritis, cancer (melanoma), DM, dizziness, dyspnea, GERD, heart murmur, history of hiatal hernia, HTN, hypothyroidism. PSH: left reverse total shoulder replacement, right knee replacement.   Clinical Impression  Pt admitted with above diagnosis. Pt currently with functional limitations due to the deficits listed below (see PT Problem List). At the time of PT eval pt was able to perform transfers and ambulation with gross min guard assist for balance support and safety. Pt will need to practice 1 stair with SPC prior to d/c, as she is only used to using a RW at home for support. Pt will benefit from skilled PT to increase their independence and safety with mobility to allow discharge to the venue listed below.     Follow Up Recommendations No PT follow up;Supervision for mobility/OOB    Equipment Recommendations  None recommended by PT    Recommendations for Other Services       Precautions / Restrictions Precautions Precautions: Shoulder Type of Shoulder Precautions: Active Protocol Shoulder Interventions: Shoulder sling/immobilizer;Off for dressing/bathing/exercises Precaution Booklet Issued: Yes (comment) Precaution Comments: Reviewed shoulder precautions with pt  Required Braces or Orthoses: Sling Restrictions Weight Bearing Restrictions: Yes RUE Weight Bearing: Non weight bearing      Mobility  Bed Mobility               General bed mobility comments: Pt sitting up in chair upon PT arrival. Wanted to sit back in chair at end of session.   Transfers Overall transfer level: Needs assistance Equipment used: Straight cane Transfers: Sit to/from Stand Sit to Stand: Min guard         General transfer comment: Close guard for safety however no physical  assist required.   Ambulation/Gait Ambulation/Gait assistance: Min guard Ambulation Distance (Feet): 200 Feet Assistive device: Straight cane Gait Pattern/deviations: Step-through pattern;Decreased stride length;Trunk flexed Gait velocity: Decreased Gait velocity interpretation: Below normal speed for age/gender General Gait Details: VC's for general safety with SPC. Pt with occasional unsteadiness and min guard was provided for safety, however pt was mostly able to recover independently.   Stairs            Wheelchair Mobility    Modified Rankin (Stroke Patients Only)       Balance Overall balance assessment: Needs assistance Sitting-balance support: Single extremity supported;Feet supported Sitting balance-Leahy Scale: Good     Standing balance support: Single extremity supported;During functional activity Standing balance-Leahy Scale: Fair Standing balance comment: Use of cane during functional mobility.                              Pertinent Vitals/Pain Pain Assessment: 0-10 Pain Score: 4  Pain Location: Right shoulder Pain Descriptors / Indicators: Aching;Constant;Operative site guarding Pain Intervention(s): Limited activity within patient's tolerance;Monitored during session;Repositioned    Home Living Family/patient expects to be discharged to:: Private residence Living Arrangements: Alone Available Help at Discharge: Family;Available 24 hours/day (Son available 24 hours as long as needed) Type of Home: House Home Access: Stairs to enter   [employer] of Steps: 13 (1 step if pt goes through from door vs garage) Home Layout: Able to live on main level with bedroom/bathroom;Two level Home Equipment: Walker - 2 wheels;Cane - single point;Shower seat      Prior  Function Level of Independence: Independent               Hand Dominance   Dominant Hand: Right    Extremity/Trunk Assessment   Upper Extremity Assessment Upper  Extremity Assessment: Defer to OT evaluation RUE Deficits / Details: s/p right reverse total shoulde replacement RUE: Unable to fully assess due to immobilization RUE Sensation:  (Pt reports no numbness or tingling in RUE)    Lower Extremity Assessment Lower Extremity Assessment: Generalized weakness    Cervical / Trunk Assessment Cervical / Trunk Assessment: Kyphotic  Communication   Communication: No difficulties  Cognition Arousal/Alertness: Awake/alert Behavior During Therapy: WFL for tasks assessed/performed Overall Cognitive Status: Within Functional Limits for tasks assessed                                        General Comments      Exercises     Assessment/Plan    PT Assessment Patient needs continued PT services  PT Problem List Decreased strength;Decreased range of motion;Decreased activity tolerance;Decreased balance;Decreased mobility;Decreased knowledge of use of DME;Decreased safety awareness;Decreased knowledge of precautions;Pain       PT Treatment Interventions DME instruction;Gait training;Stair training;Functional mobility training;Therapeutic activities;Therapeutic exercise;Neuromuscular re-education;Patient/family education    PT Goals (Current goals can be found in the Care Plan section)  Acute Rehab PT Goals Patient Stated Goal: To go home PT Goal Formulation: With patient Time For Goal Achievement: 11/06/16 Potential to Achieve Goals: Good    Frequency Min 3X/week   Barriers to discharge        Co-evaluation               AM-PAC PT "6 Clicks" Daily Activity  Outcome Measure Difficulty turning over in bed (including adjusting bedclothes, sheets and blankets)?: A Little Difficulty moving from lying on back to sitting on the side of the bed? : A Little Difficulty sitting down on and standing up from a chair with arms (e.g., wheelchair, bedside commode, etc,.)?: A Little Help needed moving to and from a bed to chair  (including a wheelchair)?: A Little Help needed walking in hospital room?: A Little Help needed climbing 3-5 steps with a railing? : A Little 6 Click Score: 18    End of Session Equipment Utilized During Treatment: Gait belt;Other (comment) (Sling) Activity Tolerance: Patient tolerated treatment well Patient left: in chair;with call bell/phone within reach;with nursing/sitter in room Nurse Communication: Mobility status PT Visit Diagnosis: Unsteadiness on feet (R26.81);Pain;Muscle weakness (generalized) (M62.81) Pain - Right/Left: Right Pain - part of body: Shoulder    Time: 1610-9604 PT Time Calculation (min) (ACUTE ONLY): 14 min   Charges:   PT Evaluation $PT Eval Moderate Complexity: 1 Mod     PT G Codes:        Megan [name], PT, DPT Acute Rehabilitation Services Pager: [phone]   Megan [name] 10/30/2016, 12:04 PM

## 2016-10-30 NOTE — Therapy (Signed)
Occupational Therapy Evaluation Patient Details Name: Megan [name] MRN: [id number] DOB: [birth year] Today's Date: 10/30/2016    History of Present Illness Pt is a 82 y.o. female s/p right reverse shoulder replacement. PMH: arthritis, cancer (melanoma), DM, dizziness, dyspnea, GERD, heart murmur, history of hiatal hernia, HTN, hypothyroidism. PSH: left reverse total shoulder replacement, right knee replacement.  (Simultaneous filing. User may not have seen previous data.)   Clinical Impression   Pt reports being independent in ADLs and was still driving PTA. Currently pt requires min guard with use of a cane for functional mobility. Pt reports that she lives alone and that her son will be staying with her initially upon d/c. Completed initial education and plan to see pt again this afternoon to review exercises. OT will follow acutely to address established goals.     Follow Up Recommendations  DC plan and follow up therapy as arranged by surgeon;Supervision/Assistance - 24 hour    Equipment Recommendations  None recommended by OT (Pt has all needed DME)    Recommendations for Other Services PT consult     Precautions / Restrictions Precautions Precautions: Shoulder Type of Shoulder Precautions: Active Protocol Shoulder Interventions: Shoulder sling/immobilizer;Off for dressing/bathing/exercises Precaution Booklet Issued: Yes (comment) Precaution Comments: Reviewed shoulder precautions with pt  Required Braces or Orthoses: Sling Restrictions Weight Bearing Restrictions: Yes RUE Weight Bearing: Non weight bearing  Active Shoulder Protocol:  AROM elbow, wrist, hand OK. AROM/PROM forward flexion: 0-90. AROM/PROM abduction: 0-60. AROM/PROM external rotation 0-30.      Mobility Bed Mobility               General bed mobility comments: Pt sitting in chair up OT arrival. Educated pt on sleep position with shoulder precautions  Transfers Overall transfer level: Needs  assistance Equipment used: Straight cane Transfers: Sit to/from Stand Sit to Stand: Min guard         General transfer comment: No physical assist required.     Balance Overall balance assessment: Needs assistance Sitting-balance support: Single extremity supported;Feet supported Sitting balance-Leahy Scale: Good     Standing balance support: Single extremity supported;During functional activity Standing balance-Leahy Scale: Fair Standing balance comment: Use of cane during functional mobility.                            ADL either performed or assessed with clinical judgement   ADL Overall ADL's : Needs assistance/impaired     Grooming: Min guard;Standing;Oral care   Upper Body Bathing: Minimal assistance;Sitting   Lower Body Bathing: Minimal assistance;Sitting/lateral leans   Upper Body Dressing : Minimal assistance;Sitting   Lower Body Dressing: Minimal assistance;Sit to/from stand   Toilet Transfer: Min guard;Ambulation   Toileting- Clothing Manipulation and Hygiene: Min guard;Sit to/from stand (Min guard for balance while standing)   Tub/ Shower Transfer: Minimal assistance;Ambulation;Shower seat Financial trader)   Functional mobility during ADLs: Min guard;Cane General ADL Comments: Educated pt sling wear scheudle and positioning in sitting and in bed. Pt reports decreased activity tolerance due to heart condition.      Vision         Perception     Praxis      Pertinent Vitals/Pain Pain Assessment: 0-10 Pain Score: 4  Pain Location: Right shoulder Pain Descriptors / Indicators: Aching;Constant;Operative site guarding Pain Intervention(s): Limited activity within patient's tolerance;Monitored during session;Repositioned     Hand Dominance Right   Extremity/Trunk Assessment Upper Extremity Assessment Upper Extremity Assessment: Defer to OT evaluation  RUE Deficits / Details: s/p right reverse total shoulde replacement (Simultaneous filing. User  may not have seen previous data.) RUE: Unable to fully assess due to immobilization RUE Sensation:  (Pt reports no numbness or tingling in RUE Simultaneous filing. User may not have seen previous data.)   Lower Extremity Assessment Lower Extremity Assessment: Defer to PT evaluation   Cervical / Trunk Assessment Cervical / Trunk Assessment: Kyphotic   Communication Communication Communication: No difficulties   Cognition Arousal/Alertness: Awake/alert Behavior During Therapy: WFL for tasks assessed/performed Overall Cognitive Status: Within Functional Limits for tasks assessed                                     General Comments       Exercises     Shoulder Instructions      Home Living Family/patient expects to be discharged to:: Private residence Living Arrangements: Alone Available Help at Discharge: Family (Son Simultaneous filing. User may not have seen previous data.) Type of Home: House Home Access: Stairs to enter [employer] of Steps: 13 (1 step if pt goes through from door vs garage)   Home Layout: Two level;Multi-level;Able to live on main level with bedroom/bathroom (Simultaneous filing. User may not have seen previous data.) Alternate Level Stairs-Number of Steps: 13   Bathroom Shower/Tub: Teacher, early years/pre: Handicapped height Bathroom Accessibility: Yes How Accessible: Accessible via walker Home Equipment: [place] - 2 wheels;Cane - single point;Shower seat          Prior Functioning/Environment Level of Independence: Independent                 OT Problem List: Impaired UE functional use;Pain;Decreased knowledge of precautions;Decreased knowledge of use of DME or AE;Decreased activity tolerance;Impaired balance (sitting and/or standing)      OT Treatment/Interventions: Self-care/ADL training;DME and/or AE instruction;Therapeutic activities;Cognitive remediation/compensation;Patient/family  education;Balance training    OT Goals(Current goals can be found in the care plan section) Acute Rehab OT Goals Patient Stated Goal: To go home OT Goal Formulation: With patient Time For Goal Achievement: 11/13/16 Potential to Achieve Goals: Good ADL Goals Pt Will Perform Upper Body Bathing: with modified independence;sitting Pt Will Perform Upper Body Dressing: with modified independence;sitting Pt Will Transfer to Toilet: with modified independence;ambulating;regular height toilet (Cane) Pt Will Perform Tub/Shower Transfer: with modified independence;ambulating;shower seat (cane) Pt/caregiver will Perform Home Exercise Program: Increased ROM;Right Upper extremity;Independently;With written HEP provided  OT Frequency: Min 2X/week   Barriers to D/C:            Co-evaluation              AM-PAC PT "6 Clicks" Daily Activity     Outcome Measure Help from another person eating meals?: None Help from another person taking care of personal grooming?: None Help from another person toileting, which includes using toliet, bedpan, or urinal?: None Help from another person bathing (including washing, rinsing, drying)?: A Little Help from another person to put on and taking off regular upper body clothing?: A Little Help from another person to put on and taking off regular lower body clothing?: A Little 6 Click Score: 21   End of Session Equipment Utilized During Treatment: Gait belt Kasandra Knudsen) Nurse Communication: Mobility status  Activity Tolerance: Patient tolerated treatment well Patient left: in chair;with call bell/phone within reach  OT Visit Diagnosis: Pain;Unsteadiness on feet (R26.81) Pain - Right/Left: Right Pain - part of  body: Shoulder                Time: 1025-1050 OT Time Calculation (min): 25 min Charges:    G-Codes:     Boykin Peek, OTS [phone]   Boykin Peek 10/30/2016, 11:58 AM

## 2016-10-31 DIAGNOSIS — Z23 Encounter for immunization: Secondary | ICD-10-CM | POA: Diagnosis not present

## 2016-10-31 LAB — URINE CULTURE: Culture: 50000 — AB

## 2016-10-31 LAB — GLUCOSE, CAPILLARY: Glucose-Capillary: 179 mg/dL — ABNORMAL HIGH (ref 65–99)

## 2016-10-31 MED ORDER — ONDANSETRON HCL 4 MG PO TABS: 4.0000 mg | ORAL_TABLET | Freq: Four times a day (QID) | ORAL | 0 refills | Status: DC | PRN

## 2016-10-31 MED ORDER — OXYCODONE HCL 5 MG PO TABS: 5.0000 mg | ORAL_TABLET | ORAL | 0 refills | Status: DC | PRN

## 2016-10-31 NOTE — Progress Notes (Signed)
Physical Therapy Treatment Patient Details Name: GWENDOLYNE WELFORD MRN: [id number] DOB: [birth year] Today's Date: 10/31/2016    History of Present Illness Pt is an 82 y.o. female s/p right reverse shoulder revision. PMH: arthritis, cancer (melanoma), DM, dizziness, dyspnea, GERD, heart murmur, history of hiatal hernia, HTN, hypothyroidism. PSH: left reverse total shoulder replacement, right knee replacement.     PT Comments    Pt progressing towards physical therapy goals. Required several standing rest breaks during gait training this session, and noted DOE. Pt Reports increased fatigue today, and overall moving slow. Will continue to follow and progress as able per POC.     Follow Up Recommendations  No PT follow up;Supervision for mobility/OOB     Equipment Recommendations  None recommended by PT    Recommendations for Other Services       Precautions / Restrictions Precautions Precautions: Shoulder Type of Shoulder Precautions: Active Protocol Shoulder Interventions: Shoulder sling/immobilizer;Off for dressing/bathing/exercises Precaution Booklet Issued: Yes (comment) Precaution Comments: Reviewed shoulder precautions with pt  Required Braces or Orthoses: Sling Restrictions Weight Bearing Restrictions: No RUE Weight Bearing: Non weight bearing    Mobility  Bed Mobility               General bed mobility comments: Pt sitting in chair upon OT arrival.  Transfers Overall transfer level: Needs assistance Equipment used: Straight cane Transfers: Sit to/from Stand Sit to Stand: Min guard         General transfer comment: Close guard for safety however no physical assist required.   Ambulation/Gait Ambulation/Gait assistance: Min assist Ambulation Distance (Feet): 200 Feet Assistive device: Straight cane Gait Pattern/deviations: Step-through pattern;Decreased stride length;Trunk flexed Gait velocity: Decreased Gait velocity interpretation: Below normal speed for  age/gender General Gait Details: VC's for general safety with SPC. Pt with occasional unsteadiness and min assist was provided for recovery and safety.   Stairs            Wheelchair Mobility    Modified Rankin (Stroke Patients Only)       Balance Overall balance assessment: Needs assistance Sitting-balance support: Single extremity supported;Feet supported Sitting balance-Leahy Scale: Good     Standing balance support: Single extremity supported;During functional activity Standing balance-Leahy Scale: Fair Standing balance comment: Use of cane during functional mobility.                             Cognition Arousal/Alertness: Awake/alert Behavior During Therapy: WFL for tasks assessed/performed Overall Cognitive Status: Within Functional Limits for tasks assessed                                        Exercises      General Comments        Pertinent Vitals/Pain Pain Assessment: Faces Faces Pain Scale: Hurts little more Pain Location: Right shoulder Pain Descriptors / Indicators: Operative site guarding;Sore    Home Living                      Prior Function            PT Goals (current goals can now be found in the care plan section) Acute Rehab PT Goals Patient Stated Goal: To go home PT Goal Formulation: With patient Time For Goal Achievement: 11/06/16 Potential to Achieve Goals: Good Progress towards PT goals: Progressing toward goals  Frequency    Min 3X/week      PT Plan Current plan remains appropriate    Co-evaluation              AM-PAC PT "6 Clicks" Daily Activity  Outcome Measure  Difficulty turning over in bed (including adjusting bedclothes, sheets and blankets)?: A Little Difficulty moving from lying on back to sitting on the side of the bed? : A Little Difficulty sitting down on and standing up from a chair with arms (e.g., wheelchair, bedside commode, etc,.)?: A Little Help  needed moving to and from a bed to chair (including a wheelchair)?: A Little Help needed walking in hospital room?: A Little Help needed climbing 3-5 steps with a railing? : A Little 6 Click Score: 18    End of Session Equipment Utilized During Treatment: Gait belt;Other (comment) (Sling) Activity Tolerance: Patient limited by fatigue Patient left: in chair;with call bell/phone within reach;with nursing/sitter in room Nurse Communication: Mobility status PT Visit Diagnosis: Unsteadiness on feet (R26.81);Pain;Muscle weakness (generalized) (M62.81) Pain - Right/Left: Right Pain - part of body: Shoulder     Time: 1001-1014 PT Time Calculation (min) (ACUTE ONLY): 13 min  Charges:  $Gait Training: 8-22 mins                    G Codes:       Rolinda Roan, PT, DPT Acute Rehabilitation Services Pager: [place] 10/31/2016, 10:55 AM

## 2016-10-31 NOTE — Progress Notes (Signed)
Subjective: Pt stable - pain ok   Objective: Vital signs in last 24 hours: Temp:  [98.9 F (37.2 C)-100.4 F (38 C)] 100.4 F (38 C) (10/04 0801) Pulse Rate:  [88-103] 96 (10/04 0801) Resp:  [16-18] 18 (10/04 0801) BP: (106-170)/(48-93) 106/93 (10/04 0801) SpO2:  [92 %-94 %] 92 % (10/04 0801)  Intake/Output from previous day: 10/03 0701 - 10/04 0700 In: 480 [P.O.:480] Out: -  Intake/Output this shift: No intake/output data recorded.  Exam:  Sensation intact distally  Labs: No results for input(s): HGB in the last 72 hours. No results for input(s): WBC, RBC, HCT, PLT in the last 72 hours. No results for input(s): NA, K, CL, CO2, BUN, CREATININE, GLUCOSE, CALCIUM in the last 72 hours. No results for input(s): LABPT, INR in the last 72 hours.  Assessment/Plan: Plan dc today   G Scott Marlou Sa 10/31/2016, 8:22 AM

## 2016-10-31 NOTE — Progress Notes (Signed)
Occupational Therapy Treatment and Discharge Patient Details Name: Megan [name] MRN: [id number] DOB: 11/08/1934 Today's Date: 10/31/2016    History of present illness Pt is an 82 y.o. female s/p right reverse shoulder revision. PMH: arthritis, cancer (melanoma), DM, dizziness, dyspnea, GERD, heart murmur, history of hiatal hernia, HTN, hypothyroidism. PSH: left reverse total shoulder replacement, right knee replacement.    OT comments  This 82 yo female seen today with focus on ADL and RUE exercises. Pt still needs a little A with basic ADLs due to decreased full AROM of LUE at shoulder. She reports her son can A her prn with anything she needs. Pt to D/C today so will D/C from acute OT.  Follow Up Recommendations  DC plan and follow up therapy as arranged by surgeon;Supervision/Assistance - 24 hour    Equipment Recommendations  Other (comment)       Precautions / Restrictions Precautions Precautions: Shoulder Type of Shoulder Precautions: Active Protocol Shoulder Interventions: Shoulder sling/immobilizer;Off for dressing/bathing/exercises Precaution Booklet Issued: Yes (comment) Precaution Comments: Reviewed shoulder precautions with pt  Required Braces or Orthoses: Sling Restrictions Weight Bearing Restrictions: Yes RUE Weight Bearing: Non weight bearing       Mobility Bed Mobility               General bed mobility comments: Pt sitting in chair upon OT arrival.  Transfers Overall transfer level: Needs assistance Equipment used: None Transfers: Sit to/from Stand Sit to Stand: Min guard         General transfer comment: Close guard for safety however no physical assist required.     Balance Overall balance assessment: Needs assistance Sitting-balance support: Single extremity supported;Feet supported Sitting balance-Leahy Scale: Good     Standing balance support: Single extremity supported;During functional activity Standing balance-Leahy Scale:  Fair Standing balance comment: Use of cane during functional mobility.                            ADL either performed or assessed with clinical judgement   ADL Overall ADL's : Needs assistance/impaired         Upper Body Bathing: Supervision/ safety;Set up;Sitting   Lower Body Bathing: Minimal assistance Lower Body Bathing Details (indicate cue type and reason): min guard A sit<>stand Upper Body Dressing : Minimal assistance;Sitting   Lower Body Dressing: Minimal assistance Lower Body Dressing Details (indicate cue type and reason): min gaurd A sit<>stand Toilet Transfer: Min guard;Ambulation   Toileting- Clothing Manipulation and Hygiene: Min guard;Sit to/from stand               Vision Patient Visual Report: No change from baseline            Cognition Arousal/Alertness: Awake/alert Behavior During Therapy: WFL for tasks assessed/performed Overall Cognitive Status: Within Functional Limits for tasks assessed                                          Exercises Other Exercises Other Exercises: Pt completed 10 reps of elbow, forearm, wrist, and hand exercises   Shoulder Instructions Shoulder Instructions Donning/doffing shirt without moving shoulder: Minimal assistance Method for sponge bathing under operated UE: Supervision/safety;Set-up Donning/doffing sling/immobilizer: Moderate assistance Correct positioning of sling/immobilizer: Moderate assistance Pendulum exercises (written home exercise program):  (NA) ROM for elbow, wrist and digits of operated UE: Supervision/safety;Set-up Sling wearing schedule (on at all  times/off for ADL's): Independent Proper positioning of operated UE when showering: Independent Dressing change:  (NA) Positioning of UE while sleeping: Supervision/safety          Pertinent Vitals/ Pain       Pain Assessment: 0-10 Pain Score: 3  Faces Pain Scale: Hurts little more Pain Location: Right  shoulder Pain Descriptors / Indicators: Operative site guarding;Sore Pain Intervention(s): Limited activity within patient's tolerance;Monitored during session         Frequency  Min 2X/week        Progress Toward Goals  OT Goals(current goals can now be found in the care plan section)  Progress towards OT goals:  (All acute OT education completd)  Acute Rehab OT Goals Patient Stated Goal: To go home  Plan Discharge plan remains appropriate       AM-PAC PT "6 Clicks" Daily Activity     Outcome Measure   Help from another person eating meals?: None Help from another person taking care of personal grooming?: None Help from another person toileting, which includes using toliet, bedpan, or urinal?: A Little Help from another person bathing (including washing, rinsing, drying)?: A Little Help from another person to put on and taking off regular upper body clothing?: A Little Help from another person to put on and taking off regular lower body clothing?: A Little 6 Click Score: 20    End of Session Equipment Utilized During Treatment:  (Sling)  OT Visit Diagnosis: Unsteadiness on feet (R26.81);Pain Pain - Right/Left: Right Pain - part of body: Shoulder   Activity Tolerance Patient tolerated treatment well   Patient Left in chair;with call bell/phone within reach   Nurse Communication  (pt ready to go from OT standpoint)        Time: 4827-0786 OT Time Calculation (min): 27 min  Charges: OT General Charges $OT Visit: 1 Visit OT Treatments $Self Care/Home Management : 23-37 mins [place], OTR/L [phone] 10/31/2016

## 2016-10-31 NOTE — Progress Notes (Signed)
Pt given D/C instructions with Rx's, verbal understanding was provided. Pt's incision is clean and dry with no sign of infection. Pt's IV was removed prior to D/C. Pt D/C'd home via wheelchair @ 1045 per MD order. Pt is stable @ D/C and has no other needs at this time. Holli Humbles, RN

## 2016-11-01 ENCOUNTER — Telehealth (INDEPENDENT_AMBULATORY_CARE_PROVIDER_SITE_OTHER): Payer: Self-pay | Admitting: Orthopedic Surgery

## 2016-11-01 DIAGNOSIS — Z7984 Long term (current) use of oral hypoglycemic drugs: Secondary | ICD-10-CM | POA: Diagnosis not present

## 2016-11-01 DIAGNOSIS — K219 Gastro-esophageal reflux disease without esophagitis: Secondary | ICD-10-CM | POA: Diagnosis not present

## 2016-11-01 DIAGNOSIS — Z96612 Presence of left artificial shoulder joint: Secondary | ICD-10-CM | POA: Diagnosis not present

## 2016-11-01 DIAGNOSIS — I10 Essential (primary) hypertension: Secondary | ICD-10-CM | POA: Diagnosis not present

## 2016-11-01 DIAGNOSIS — E039 Hypothyroidism, unspecified: Secondary | ICD-10-CM | POA: Diagnosis not present

## 2016-11-01 DIAGNOSIS — Z9181 History of falling: Secondary | ICD-10-CM | POA: Diagnosis not present

## 2016-11-01 DIAGNOSIS — Z471 Aftercare following joint replacement surgery: Secondary | ICD-10-CM | POA: Diagnosis not present

## 2016-11-01 DIAGNOSIS — Z96611 Presence of right artificial shoulder joint: Secondary | ICD-10-CM | POA: Diagnosis not present

## 2016-11-01 DIAGNOSIS — E119 Type 2 diabetes mellitus without complications: Secondary | ICD-10-CM | POA: Diagnosis not present

## 2016-11-01 DIAGNOSIS — Z7902 Long term (current) use of antithrombotics/antiplatelets: Secondary | ICD-10-CM | POA: Diagnosis not present

## 2016-11-01 NOTE — Telephone Encounter (Signed)
IC verbal given.  

## 2016-11-01 NOTE — Telephone Encounter (Signed)
Mary from [place] [facility] called asking for verbal orders on 1 week 1, 3 week 1 and 1 week 1. CB # [phone]

## 2016-11-04 DIAGNOSIS — E039 Hypothyroidism, unspecified: Secondary | ICD-10-CM | POA: Diagnosis not present

## 2016-11-04 DIAGNOSIS — Z7902 Long term (current) use of antithrombotics/antiplatelets: Secondary | ICD-10-CM | POA: Diagnosis not present

## 2016-11-04 DIAGNOSIS — Z96611 Presence of right artificial shoulder joint: Secondary | ICD-10-CM | POA: Diagnosis not present

## 2016-11-04 DIAGNOSIS — I10 Essential (primary) hypertension: Secondary | ICD-10-CM | POA: Diagnosis not present

## 2016-11-04 DIAGNOSIS — K219 Gastro-esophageal reflux disease without esophagitis: Secondary | ICD-10-CM | POA: Diagnosis not present

## 2016-11-04 DIAGNOSIS — Z7984 Long term (current) use of oral hypoglycemic drugs: Secondary | ICD-10-CM | POA: Diagnosis not present

## 2016-11-04 DIAGNOSIS — Z96612 Presence of left artificial shoulder joint: Secondary | ICD-10-CM | POA: Diagnosis not present

## 2016-11-04 DIAGNOSIS — Z9181 History of falling: Secondary | ICD-10-CM | POA: Diagnosis not present

## 2016-11-04 DIAGNOSIS — E119 Type 2 diabetes mellitus without complications: Secondary | ICD-10-CM | POA: Diagnosis not present

## 2016-11-04 DIAGNOSIS — Z471 Aftercare following joint replacement surgery: Secondary | ICD-10-CM | POA: Diagnosis not present

## 2016-11-05 DIAGNOSIS — K219 Gastro-esophageal reflux disease without esophagitis: Secondary | ICD-10-CM | POA: Diagnosis not present

## 2016-11-05 DIAGNOSIS — I10 Essential (primary) hypertension: Secondary | ICD-10-CM | POA: Diagnosis not present

## 2016-11-05 DIAGNOSIS — Z7902 Long term (current) use of antithrombotics/antiplatelets: Secondary | ICD-10-CM | POA: Diagnosis not present

## 2016-11-05 DIAGNOSIS — Z9181 History of falling: Secondary | ICD-10-CM | POA: Diagnosis not present

## 2016-11-05 DIAGNOSIS — E119 Type 2 diabetes mellitus without complications: Secondary | ICD-10-CM | POA: Diagnosis not present

## 2016-11-05 DIAGNOSIS — Z7984 Long term (current) use of oral hypoglycemic drugs: Secondary | ICD-10-CM | POA: Diagnosis not present

## 2016-11-05 DIAGNOSIS — Z96612 Presence of left artificial shoulder joint: Secondary | ICD-10-CM | POA: Diagnosis not present

## 2016-11-05 DIAGNOSIS — Z96611 Presence of right artificial shoulder joint: Secondary | ICD-10-CM | POA: Diagnosis not present

## 2016-11-05 DIAGNOSIS — Z471 Aftercare following joint replacement surgery: Secondary | ICD-10-CM | POA: Diagnosis not present

## 2016-11-05 DIAGNOSIS — E039 Hypothyroidism, unspecified: Secondary | ICD-10-CM | POA: Diagnosis not present

## 2016-11-07 DIAGNOSIS — K219 Gastro-esophageal reflux disease without esophagitis: Secondary | ICD-10-CM | POA: Diagnosis not present

## 2016-11-07 DIAGNOSIS — Z7902 Long term (current) use of antithrombotics/antiplatelets: Secondary | ICD-10-CM | POA: Diagnosis not present

## 2016-11-07 DIAGNOSIS — Z471 Aftercare following joint replacement surgery: Secondary | ICD-10-CM | POA: Diagnosis not present

## 2016-11-07 DIAGNOSIS — Z96611 Presence of right artificial shoulder joint: Secondary | ICD-10-CM | POA: Diagnosis not present

## 2016-11-07 DIAGNOSIS — E119 Type 2 diabetes mellitus without complications: Secondary | ICD-10-CM | POA: Diagnosis not present

## 2016-11-07 DIAGNOSIS — E039 Hypothyroidism, unspecified: Secondary | ICD-10-CM | POA: Diagnosis not present

## 2016-11-07 DIAGNOSIS — Z7984 Long term (current) use of oral hypoglycemic drugs: Secondary | ICD-10-CM | POA: Diagnosis not present

## 2016-11-07 DIAGNOSIS — Z96612 Presence of left artificial shoulder joint: Secondary | ICD-10-CM | POA: Diagnosis not present

## 2016-11-07 DIAGNOSIS — Z9181 History of falling: Secondary | ICD-10-CM | POA: Diagnosis not present

## 2016-11-07 DIAGNOSIS — I10 Essential (primary) hypertension: Secondary | ICD-10-CM | POA: Diagnosis not present

## 2016-11-11 DIAGNOSIS — Z96612 Presence of left artificial shoulder joint: Secondary | ICD-10-CM | POA: Diagnosis not present

## 2016-11-11 DIAGNOSIS — Z9181 History of falling: Secondary | ICD-10-CM | POA: Diagnosis not present

## 2016-11-11 DIAGNOSIS — Z471 Aftercare following joint replacement surgery: Secondary | ICD-10-CM | POA: Diagnosis not present

## 2016-11-11 DIAGNOSIS — Z7902 Long term (current) use of antithrombotics/antiplatelets: Secondary | ICD-10-CM | POA: Diagnosis not present

## 2016-11-11 DIAGNOSIS — K219 Gastro-esophageal reflux disease without esophagitis: Secondary | ICD-10-CM | POA: Diagnosis not present

## 2016-11-11 DIAGNOSIS — Z7984 Long term (current) use of oral hypoglycemic drugs: Secondary | ICD-10-CM | POA: Diagnosis not present

## 2016-11-11 DIAGNOSIS — E119 Type 2 diabetes mellitus without complications: Secondary | ICD-10-CM | POA: Diagnosis not present

## 2016-11-11 DIAGNOSIS — Z96611 Presence of right artificial shoulder joint: Secondary | ICD-10-CM | POA: Diagnosis not present

## 2016-11-11 DIAGNOSIS — I10 Essential (primary) hypertension: Secondary | ICD-10-CM | POA: Diagnosis not present

## 2016-11-11 DIAGNOSIS — E039 Hypothyroidism, unspecified: Secondary | ICD-10-CM | POA: Diagnosis not present

## 2016-11-13 ENCOUNTER — Ambulatory Visit (INDEPENDENT_AMBULATORY_CARE_PROVIDER_SITE_OTHER): Payer: Medicare Other | Admitting: Orthopedic Surgery

## 2016-11-13 ENCOUNTER — Encounter (INDEPENDENT_AMBULATORY_CARE_PROVIDER_SITE_OTHER): Payer: Self-pay | Admitting: Orthopedic Surgery

## 2016-11-13 ENCOUNTER — Ambulatory Visit (INDEPENDENT_AMBULATORY_CARE_PROVIDER_SITE_OTHER): Payer: Medicare Other

## 2016-11-13 DIAGNOSIS — Z96611 Presence of right artificial shoulder joint: Secondary | ICD-10-CM | POA: Diagnosis not present

## 2016-11-13 MED ORDER — OXYCODONE HCL 5 MG PO TABS: 5.0000 mg | ORAL_TABLET | Freq: Three times a day (TID) | ORAL | 0 refills | Status: DC | PRN

## 2016-11-13 NOTE — Progress Notes (Signed)
   Post-Op Visit Note   [name]           Date of Birth: [birth year]           MRN: [id number] Visit Date: 11/13/2016 PCP: [facility]   Assessment & Plan:  Chief Complaint:  Chief Complaint  Patient presents with  . Right Shoulder - Routine Post Op   Visit Diagnoses:  1. Status post reverse total replacement of right shoulder     Plan: Megan [name] is an 82 year old female who is now 2 weeks out reverse shoulder replacement on the right.  Taking oxycodone for pain.  She is in the CPM machine 3 times a day and has it to 90.  On exam she has good passive range of motion and functional deltoid.  Plan is to start physical therapy.  4 week return for clinical recheck.  Discontinue sling.  oxy refilled 1  Follow-Up Instructions: No Follow-up on file.   Orders:  Orders Placed This Encounter  Procedures  . XR Shoulder Right   Meds ordered this encounter  Medications  . oxyCODONE (OXY IR/ROXICODONE) 5 MG immediate release tablet    Sig: Take 1-2 tablets (5-10 mg total) by mouth every 8 (eight) hours as needed for breakthrough pain.    Dispense:  45 tablet    Refill:  0    Imaging: Xr Shoulder Right  Result Date: 11/13/2016 AP lateral right proximal humerus reviewed.  Reverse shoulder replacement has been placed.  No dislocation of complicating features.   PMFS History: Patient Active Problem List   Diagnosis Date Noted  . Shoulder arthritis 10/29/2016  . Aortic stenosis 06/21/2016  . Lightheaded 06/05/2016  . Poor balance 06/05/2016  . Systolic murmur 50/27/7412  . Sensorineural hearing loss (SNHL), bilateral 02/02/2016  . Arthritis of shoulder region, left, degenerative 07/14/2014   Past Medical History:  Diagnosis Date  . Arthritis   . Cancer ([place]) 1989   melanoma  . Diabetes mellitus without complication ([place])   . Dizziness   . Dyspnea   . GERD (gastroesophageal reflux disease)   . Heart murmur   . History of hiatal hernia   .  Hypertension   . Hypothyroidism     No family history on file.  Past Surgical History:  Procedure Laterality Date  . APPENDECTOMY     82 yrs old  . CHOLECYSTECTOMY  89  . EYE SURGERY Bilateral 2017   cataract surgery  . JOINT REPLACEMENT Right    knee   . REVERSE SHOULDER ARTHROPLASTY Left 07/14/2014  . REVERSE SHOULDER ARTHROPLASTY Left 07/14/2014   Procedure: LEFT REVERSE SHOULDER ARTHROPLASTY;  Surgeon: Meredith Pel, MD;  Location: [facility];  Service: Orthopedics;  Laterality: Left;  . SHOULDER ARTHROSCOPY Left   . TOTAL SHOULDER REVISION Right 10/29/2016   Procedure: RIGHT REVERSE TOTAL SHOULDER;  Surgeon: Meredith Pel, MD;  Location: [facility];  Service: Orthopedics;  Laterality: Right;   Social History   Occupational History  . Not on file.   Social History Main Topics  . Smoking status: Never Smoker  . Smokeless tobacco: Never Used  . Alcohol use No  . Drug use: No  . Sexual activity: Not on file

## 2016-11-13 NOTE — Discharge Summary (Signed)
Physician Discharge Summary  Patient ID: Megan [name] MRN: [id number] DOB/AGE: [birth year] 82 y.o.  Admit date: 10/29/2016 Discharge date: 10/31/2016  Admission Diagnoses:  Active Problems:   Shoulder arthritis   Discharge Diagnoses:  Same  Surgeries: Procedure(s): RIGHT REVERSE TOTAL SHOULDER on 10/29/2016   Consultants:   Discharged Condition: Stable  Hospital Course: Megan [name] is an 82 y.o. female who was admitted 10/29/2016 with a chief complaint of Right shoulder pain, and found to have a diagnosis of right shoulder arthritis.  They were brought to the operating room on 10/29/2016 and underwent the above named procedures.  Patient tolerated the procedure well.  Deltoid functional postoperative period occupational therapy started for shoulder mobilization.  Patient discharged home in good condition on postop day #2.  She will follow up with me in 10 days  Antibiotics given:  Anti-infectives    Start     Dose/Rate Route Frequency Ordered Stop   10/29/16 1400  ceFAZolin (ANCEF) IVPB 2g/100 mL premix     2 g 200 mL/hr over 30 Minutes Intravenous Every 6 hours 10/29/16 1348 10/29/16 1955   10/29/16 1044  vancomycin (VANCOCIN) powder  Status:  Discontinued       As needed 10/29/16 1044 10/29/16 1133   10/29/16 0700  ceFAZolin (ANCEF) IVPB 2g/100 mL premix     2 g 200 mL/hr over 30 Minutes Intravenous To [facility] 10/28/16 1301 10/29/16 0756    .  Recent vital signs:  Vitals:   10/31/16 0400 10/31/16 0801  BP: (!) 142/48 (!) 106/93  Pulse: 98 96  Resp: 18 18  Temp: 99.7 F (37.6 C) (!) 100.4 F (38 C)  SpO2: 93% 92%    Recent laboratory studies:  Results for orders placed or performed during the hospital encounter of 10/29/16  Urine culture  Result Value Ref Range   Specimen Description URINE, RANDOM    Special Requests NONE    Culture 50,000 COLONIES/mL STAPHYLOCOCCUS CAPITIS (A)    Report Status 10/31/2016 FINAL    Organism ID, Bacteria STAPHYLOCOCCUS  CAPITIS (A)       Susceptibility   Staphylococcus capitis - MIC*    CIPROFLOXACIN <=0.5 SENSITIVE Sensitive     GENTAMICIN <=0.5 SENSITIVE Sensitive     NITROFURANTOIN <=16 SENSITIVE Sensitive     OXACILLIN <=0.25 SENSITIVE Sensitive     TETRACYCLINE <=1 SENSITIVE Sensitive     VANCOMYCIN <=0.5 SENSITIVE Sensitive     TRIMETH/SULFA <=10 SENSITIVE Sensitive     CLINDAMYCIN >=8 RESISTANT Resistant     RIFAMPIN <=0.5 SENSITIVE Sensitive     Inducible Clindamycin NEGATIVE Sensitive     * 50,000 COLONIES/mL STAPHYLOCOCCUS CAPITIS  Urinalysis, Complete w Microscopic  Result Value Ref Range   Color, Urine YELLOW YELLOW   APPearance HAZY (A) CLEAR   Specific Gravity, Urine 1.020 1.005 - 1.030   pH 5.5 5.0 - 8.0   Glucose, UA NEGATIVE NEGATIVE mg/dL   Hgb urine dipstick NEGATIVE NEGATIVE   Bilirubin Urine NEGATIVE NEGATIVE   Ketones, ur NEGATIVE NEGATIVE mg/dL   Protein, ur NEGATIVE NEGATIVE mg/dL   Nitrite NEGATIVE NEGATIVE   Leukocytes, UA SMALL (A) NEGATIVE   Squamous Epithelial / LPF 0-5 (A) NONE SEEN   WBC, UA 0-5 0 - 5 WBC/hpf   RBC / HPF 0-5 0 - 5 RBC/hpf   Bacteria, UA RARE (A) NONE SEEN   Urine-Other LESS THAN 10 mL OF URINE SUBMITTED   Glucose, capillary  Result Value Ref Range   Glucose-Capillary 180 (H)  65 - 99 mg/dL  Glucose, capillary  Result Value Ref Range   Glucose-Capillary 151 (H) 65 - 99 mg/dL   Comment 1 Notify RN   Glucose, capillary  Result Value Ref Range   Glucose-Capillary 214 (H) 65 - 99 mg/dL   Comment 1 Notify RN    Comment 2 Document in Chart   Glucose, capillary  Result Value Ref Range   Glucose-Capillary 216 (H) 65 - 99 mg/dL   Comment 1 Notify RN    Comment 2 Document in Chart   Glucose, capillary  Result Value Ref Range   Glucose-Capillary 211 (H) 65 - 99 mg/dL  Glucose, capillary  Result Value Ref Range   Glucose-Capillary 237 (H) 65 - 99 mg/dL  Glucose, capillary  Result Value Ref Range   Glucose-Capillary 240 (H) 65 - 99 mg/dL   Glucose, capillary  Result Value Ref Range   Glucose-Capillary 219 (H) 65 - 99 mg/dL  Glucose, capillary  Result Value Ref Range   Glucose-Capillary 213 (H) 65 - 99 mg/dL   Comment 1 Notify RN    Comment 2 Document in Chart   Glucose, capillary  Result Value Ref Range   Glucose-Capillary 179 (H) 65 - 99 mg/dL    Discharge Medications:   Allergies as of 10/31/2016   No Known Allergies     Medication List    STOP taking these medications   aspirin 325 MG EC tablet   naproxen sodium 220 MG tablet Commonly known as:  ANAPROX     TAKE these medications   ACCU-CHEK AVIVA PLUS test strip Generic drug:  glucose blood 1 each by Other route 2 (two) times daily.   ACCU-CHEK SOFTCLIX LANCETS lancets 1 each by Other route 2 (two) times daily.   amLODipine-valsartan 5-160 MG tablet Commonly known as:  EXFORGE Take 1 tablet by mouth daily.   calcium-vitamin D 500-200 MG-UNIT tablet Commonly known as:  OSCAL WITH D Take 1 tablet by mouth at bedtime.   clopidogrel 75 MG tablet Commonly known as:  PLAVIX Take 75 mg by mouth at bedtime.   econazole nitrate 1 % cream Apply 1 application topically daily. For nose/corners of mouth   Fish Oil 1200 MG Caps Take 1,200 mg by mouth daily with breakfast.   glimepiride 2 MG tablet Commonly known as:  AMARYL Take 4 mg by mouth daily with breakfast.   hydrocortisone 2.5 % cream Apply 1 application topically daily. Applied to nose/corners of mouth   ketotifen 0.025 % ophthalmic solution Commonly known as:  ZADITOR Place 1 drop into both eyes 2 (two) times daily as needed (for itchy eyes.).   multivitamin with minerals Tabs tablet Take 1 tablet by mouth at bedtime.   MYRBETRIQ 50 MG Tb24 tablet Generic drug:  mirabegron ER Take 50 mg by mouth daily.   ondansetron 4 MG tablet Commonly known as:  ZOFRAN Take 1 tablet (4 mg total) by mouth every 6 (six) hours as needed for nausea.   ranitidine 150 MG tablet Commonly known as:   ZANTAC Take 150 mg by mouth 2 (two) times daily.   REFRESH OP Place 1 drop into both eyes 3 (three) times daily as needed (dry eyes).   sitaGLIPtin 100 MG tablet Commonly known as:  JANUVIA Take 100 mg by mouth daily.   SYNTHROID 125 MCG tablet Generic drug:  levothyroxine Take 125 mcg by mouth daily before breakfast.   triamterene-hydrochlorothiazide 37.5-25 MG capsule Commonly known as:  DYAZIDE Take 1 capsule by mouth daily  as needed (swelling).       Diagnostic Studies: Dg Shoulder Right Port  Result Date: 10/29/2016 CLINICAL DATA:  Status post right reverse total shoulder arthroplasty. EXAM: PORTABLE RIGHT SHOULDER COMPARISON:  07/29/2016 FINDINGS: Hardware components of a right total shoulder arthroplasty are identified and appear to be in anatomic alignment. No periprosthetic fracture or dislocation identified. IMPRESSION: Status post right total shoulder arthroplasty. Electronically Signed   By: Kerby Moors M.D.   On: 10/29/2016 12:52   Xr Shoulder Right  Result Date: 11/13/2016 AP lateral right proximal humerus reviewed.  Reverse shoulder replacement has been placed.  No dislocation of complicating features.   Disposition: 01-Home or Self Care  Discharge Instructions    Call MD / Call 911    Complete by:  As directed    If you experience chest pain or shortness of breath, CALL 911 and be transported to the hospital emergency room.  If you develope a fever above 101 F, pus (white drainage) or increased drainage or redness at the wound, or calf pain, call your surgeon's office.   Constipation Prevention    Complete by:  As directed    Drink plenty of fluids.  Prune juice may be helpful.  You may use a stool softener, such as Colace (over the counter) 100 mg twice a day.  Use MiraLax (over the counter) for constipation as needed.   Diet - low sodium heart healthy    Complete by:  As directed    Discharge instructions    Complete by:  As directed    Ok to shower -  dressing waterproof Ok to move shoulder out of sling No lifting right arm   Increase activity slowly as tolerated    Complete by:  As directed          Signed: Anderson Malta 11/13/2016, 5:45 PM

## 2016-11-18 DIAGNOSIS — Z96611 Presence of right artificial shoulder joint: Secondary | ICD-10-CM | POA: Diagnosis not present

## 2016-11-18 DIAGNOSIS — M6281 Muscle weakness (generalized): Secondary | ICD-10-CM | POA: Diagnosis not present

## 2016-11-18 DIAGNOSIS — M25511 Pain in right shoulder: Secondary | ICD-10-CM | POA: Diagnosis not present

## 2016-11-20 ENCOUNTER — Telehealth (INDEPENDENT_AMBULATORY_CARE_PROVIDER_SITE_OTHER): Payer: Self-pay | Admitting: Physical Medicine and Rehabilitation

## 2016-11-20 DIAGNOSIS — M6281 Muscle weakness (generalized): Secondary | ICD-10-CM | POA: Diagnosis not present

## 2016-11-20 DIAGNOSIS — M25511 Pain in right shoulder: Secondary | ICD-10-CM | POA: Diagnosis not present

## 2016-11-20 DIAGNOSIS — M5416 Radiculopathy, lumbar region: Secondary | ICD-10-CM

## 2016-11-20 DIAGNOSIS — Z96611 Presence of right artificial shoulder joint: Secondary | ICD-10-CM | POA: Diagnosis not present

## 2016-11-20 NOTE — Telephone Encounter (Signed)
Last MRI is from 2006, we need Lspine MRI w/o contrast

## 2016-11-20 NOTE — Telephone Encounter (Signed)
Order placed ready for cosign.

## 2016-11-21 NOTE — Telephone Encounter (Signed)
Patient notified via voicemail that order for MRI has been placed and that [facility] would call her to schedule. We will call to schedule follow up once MRI has been scheduled.

## 2016-11-22 ENCOUNTER — Telehealth (INDEPENDENT_AMBULATORY_CARE_PROVIDER_SITE_OTHER): Payer: Self-pay | Admitting: Orthopedic Surgery

## 2016-11-22 NOTE — Telephone Encounter (Signed)
IC no answer, LMVM for patient advised look like MRI that Dr Ernestina Patches was ordering was for her lumbar spine and she should proceed with this.

## 2016-11-22 NOTE — Telephone Encounter (Signed)
Dr Ernestina Patches has requested for patient to have MRI and it is scheduled. Patient wants to know if its for MRI due to to the surgery in right shoulder. Was not sure if she should have or not

## 2016-11-26 ENCOUNTER — Ambulatory Visit
Admission: RE | Admit: 2016-11-26 | Discharge: 2016-11-26 | Disposition: A | Discharge status: Home / Self Care | Payer: Medicare Other | Source: Ambulatory Visit | Attending: Physical Medicine and Rehabilitation | Admitting: Physical Medicine and Rehabilitation

## 2016-11-26 DIAGNOSIS — M48061 Spinal stenosis, lumbar region without neurogenic claudication: Secondary | ICD-10-CM | POA: Diagnosis not present

## 2016-11-26 DIAGNOSIS — M5416 Radiculopathy, lumbar region: Secondary | ICD-10-CM

## 2016-11-26 DIAGNOSIS — Z96611 Presence of right artificial shoulder joint: Secondary | ICD-10-CM | POA: Diagnosis not present

## 2016-11-26 DIAGNOSIS — M6281 Muscle weakness (generalized): Secondary | ICD-10-CM | POA: Diagnosis not present

## 2016-11-26 DIAGNOSIS — M25511 Pain in right shoulder: Secondary | ICD-10-CM | POA: Diagnosis not present

## 2016-11-27 DIAGNOSIS — E119 Type 2 diabetes mellitus without complications: Secondary | ICD-10-CM | POA: Diagnosis not present

## 2016-11-27 DIAGNOSIS — I10 Essential (primary) hypertension: Secondary | ICD-10-CM | POA: Diagnosis not present

## 2016-11-27 DIAGNOSIS — Z0000 Encounter for general adult medical examination without abnormal findings: Secondary | ICD-10-CM | POA: Diagnosis not present

## 2016-11-27 DIAGNOSIS — E039 Hypothyroidism, unspecified: Secondary | ICD-10-CM | POA: Diagnosis not present

## 2016-11-27 DIAGNOSIS — E118 Type 2 diabetes mellitus with unspecified complications: Secondary | ICD-10-CM | POA: Diagnosis not present

## 2016-11-27 DIAGNOSIS — E782 Mixed hyperlipidemia: Secondary | ICD-10-CM | POA: Diagnosis not present

## 2016-11-27 DIAGNOSIS — E789 Disorder of lipoprotein metabolism, unspecified: Secondary | ICD-10-CM | POA: Diagnosis not present

## 2016-11-28 ENCOUNTER — Encounter (INDEPENDENT_AMBULATORY_CARE_PROVIDER_SITE_OTHER): Payer: Self-pay | Admitting: Physical Medicine and Rehabilitation

## 2016-11-28 ENCOUNTER — Ambulatory Visit (INDEPENDENT_AMBULATORY_CARE_PROVIDER_SITE_OTHER): Payer: Medicare Other | Admitting: Physical Medicine and Rehabilitation

## 2016-11-28 VITALS — BP 158/72 | HR 81

## 2016-11-28 DIAGNOSIS — M48061 Spinal stenosis, lumbar region without neurogenic claudication: Secondary | ICD-10-CM | POA: Diagnosis not present

## 2016-11-28 DIAGNOSIS — M5416 Radiculopathy, lumbar region: Secondary | ICD-10-CM

## 2016-11-28 DIAGNOSIS — M25511 Pain in right shoulder: Secondary | ICD-10-CM | POA: Diagnosis not present

## 2016-11-28 DIAGNOSIS — M6281 Muscle weakness (generalized): Secondary | ICD-10-CM | POA: Diagnosis not present

## 2016-11-28 DIAGNOSIS — Z96611 Presence of right artificial shoulder joint: Secondary | ICD-10-CM | POA: Diagnosis not present

## 2016-11-28 MED ORDER — ACETAMINOPHEN-CODEINE #3 300-30 MG PO TABS: 1.0000 | ORAL_TABLET | Freq: Three times a day (TID) | ORAL | 0 refills | Status: DC | PRN

## 2016-11-28 MED ORDER — GABAPENTIN 100 MG PO CAPS: 100.0000 mg | ORAL_CAPSULE | Freq: Every day | ORAL | 0 refills | Status: DC

## 2016-11-28 NOTE — Progress Notes (Deleted)
Here for MRI review. Back pain sometimes. Right posterior calf pain with occasional sharp pain in right thigh. Both legs hurt, but the right is the worst.

## 2016-11-28 NOTE — Progress Notes (Signed)
Megan [name] - 82 y.o. female MRN [id number]  Date of birth: 08-Oct-1934  Office Visit Note: Visit Date: 11/28/2016 PCP: [facility] Referred by: [facility]  Subjective: Chief Complaint  Patient presents with  . Lower Back - Pain  . Left Leg - Pain  . Right Leg - Pain, Numbness   HPI: Megan [name] is an 82 year old female with chronic worsening low back and right hip and leg pain consistent with neurogenic claudication and stenosis and radiculopathy.  We completed epidural injection in May of this year that did fairly well then we repeated the injection in September with only 2 weeks of relief.  Her case is complicated by multiple joint osteoarthritis.  Megan [name] has recently had a right reverse total shoulder replacement.  Megan [name] is doing okay from that.  We did obtain an MRI of the lumbar spine because Megan [name] had not had this done recently.  Her case is complicated by being on Plavix anticoagulation.  We did review the MRI with her.  Her symptoms have not changed and only worsened.  Megan [name] has no focal weakness.  Megan [name] has a hard time ambulating without trying to find a place to rest.  Megan [name] has no pain sitting.  Megan [name] does get tingling paresthesias at times.  MRI was reviewed with the patient and does show severe worsening of her stenosis.  Megan [name] does get symptoms in both legs but the right is worse than the left.    Review of Systems  Constitutional: Negative for chills, fever, malaise/fatigue and weight loss.  HENT: Negative for hearing loss and sinus pain.   Eyes: Negative for blurred vision, double vision and photophobia.  Respiratory: Negative for cough and shortness of breath.   Cardiovascular: Negative for chest pain, palpitations and leg swelling.  Gastrointestinal: Negative for abdominal pain, nausea and vomiting.  Genitourinary: Negative for flank pain.  Musculoskeletal: Positive for back pain. Negative for myalgias.       Right more than left leg pain  Skin: Negative  for itching and rash.  Neurological: Positive for tingling. Negative for tremors, focal weakness and weakness.  Endo/Heme/Allergies: Negative.   Psychiatric/Behavioral: Negative for depression.  All other systems reviewed and are negative.  Otherwise per HPI.  Assessment & Plan: Visit Diagnoses:  1. Lumbar radiculopathy   2. Spinal stenosis of lumbar region without neurogenic claudication     Plan: Findings:  Chronic worsening severe right more than left hip and leg pain consistent with radiculopathy from stenosis.  Updated MRI shows severe multifactorial stenosis at L4-5.  There is a lot of bulky ligamentum flavum thickening.  There is only a very small amount of listhesis but this is new compared to the prior exam.  I think at this point we should try transforaminal epidural steroid injection.  We could do this with her on the blood thinner but Megan [name] has had no issues coming off the blood thinner and will try to get her off Plavix before we do the injection.  I also want to make a referral to Dr. Louanne Skye for evaluation from a surgical standpoint.  I am unsure given the fact that there is new listhesis here but is been a long time since Megan [name] had an MRI that he would require a fusion or not but I think at some that Megan [name] could talk to him about.  Megan [name] is 82 years old but otherwise pretty active.  We did go ahead and provide some pain medication until we could do the  injection.    Meds & Orders:  Meds ordered this encounter  Medications  . acetaminophen-codeine (TYLENOL #3) 300-30 MG tablet    Sig: Take 1 tablet by mouth every 8 (eight) hours as needed for moderate pain.    Dispense:  30 tablet    Refill:  0  . gabapentin (NEURONTIN) 100 MG capsule    Sig: Take 1 capsule (100 mg total) by mouth at bedtime.    Dispense:  90 capsule    Refill:  0    Orders Placed This Encounter  Procedures  . Ambulatory referral to Orthopedic Surgery    Follow-up: Return for Bilateral L4 transforaminal epidural  steroid injection..   Procedures: No procedures performed  No notes on file   Clinical History: MRI LUMBAR SPINE WITHOUT CONTRAST  TECHNIQUE: Multiplanar, multisequence MR imaging of the lumbar spine was performed. No intravenous contrast was administered.  COMPARISON:  MRI lumbar spine 08/14/2004.  FINDINGS: Segmentation:  Standard.  Alignment: 0.6 cm anterolisthesis L4 on L5 due to facet arthropathy is new since the prior examination. Trace anterolisthesis L5 on S1 is unchanged.  Vertebrae:  No fracture or worrisome lesion.  Conus medullaris: Extends to the L1-2 level and appears normal.  Paraspinal and other soft tissues: Negative.  Disc levels:  T11-12 is imaged in the sagittal plane only. There is a minimal central protrusion without stenosis.  T12-L1:  Minimal right paracentral protrusion without stenosis.  L1-2:  Negative.  L2-3: Mild facet degenerative disease and a minimal disc bulge without central canal or foraminal stenosis.  L3-4: Moderate facet arthropathy, ligamentum flavum thickening and a shallow disc bulge. There is moderate central canal stenosis with narrowing in the subarticular recesses which could impact either descending L4 root. The foramina are open. Spondylosis has worsened since the prior MRI.  L4-5: Advanced facet degenerative disease and bulky ligamentum flavum thickening are seen. The disc is uncovered with a shallow bulge. There is severe central canal and bilateral subarticular recess narrowing. Mild left foraminal narrowing is seen. The right foramen is open. Spondylosis is much worse than on the prior MRI.  L5-S1: Advanced facet degenerative disease is worse on the left. There is a shallow broad-based central protrusion and ligamentum flavum thickening. Narrowing is seen in the subarticular recesses. Spondylosis has worsened since the prior MRI.  IMPRESSION: Worsened spondylosis at L4-5 where there is severe  central canal and bilateral subarticular recess narrowing due to bulky ligamentum flavum thickening and a shallow disc bulge. Advanced facet arthropathy at this level results in 0.6 cm anterolisthesis.  Worsened spondylosis at L3-4 where there is moderate central canal stenosis and narrowing in the subarticular recesses which could impact either descending L4 root.  Some progression spondylosis at L5-S1 where there is a shallow broad-based central protrusion and facet arthropathy resulting in narrowing in the subarticular recesses which could irritate either descending S1 root.   Electronically Signed   By: Inge Rise M.D.   On: 11/26/2016 12:04  Megan [name] reports that  has never smoked. Megan [name] has never used smokeless tobacco.  Recent Labs    07/29/16 1035 10/24/16 1052  HGBA1C 7.1* 7.2*    Objective:  VS:  HT:    WT:   BMI:     BP:(!) 158/72  HR:81bpm  TEMP: ( )  RESP:  Physical Exam  Constitutional: Megan [name] is oriented to person, place, and time. Megan [name] appears well-developed and well-nourished. No distress.  HENT:  Head: Normocephalic and atraumatic.  Nose: Nose normal.  Mouth/Throat: Oropharynx is  clear and moist.  Eyes: Conjunctivae and EOM are normal. Pupils are equal, round, and reactive to light.  Neck: Normal range of motion. Neck supple.  Cardiovascular: Normal rate, regular rhythm and intact distal pulses.  Pulmonary/Chest: Effort normal. No respiratory distress.  Abdominal: Megan [name] exhibits no distension. There is no guarding.  Musculoskeletal:  Patient ambulates slowly with wide-based gait.  Megan [name] has no pain with hip rotation.  Megan [name] has good distal strength.  Megan [name] has no clonus bilaterally.  Megan [name] has good distal strength with normal 5 out of 5 strength hip flexion and knee flexion extension and dorsiflexion plantarflexion and EHL.  Neurological: Megan [name] is alert and oriented to person, place, and time. Megan [name] exhibits normal muscle tone. Coordination normal.  Skin: Skin is  warm and dry. No rash noted. No erythema.  Psychiatric: Megan [name] has a normal mood and affect. Her behavior is normal.  Nursing note and vitals reviewed.   Ortho Exam Imaging: No results found.  Past Medical/Family/Surgical/Social History: Medications & Allergies reviewed per EMR Patient Active Problem List   Diagnosis Date Noted  . Shoulder arthritis 10/29/2016  . Aortic stenosis 06/21/2016  . Lightheaded 06/05/2016  . Poor balance 06/05/2016  . Systolic murmur 26/20/3559  . Sensorineural hearing loss (SNHL), bilateral 02/02/2016  . Arthritis of shoulder region, left, degenerative 07/14/2014   Past Medical History:  Diagnosis Date  . Arthritis   . Cancer ([place]) 1989   melanoma  . Diabetes mellitus without complication ([place])   . Dizziness   . Dyspnea   . GERD (gastroesophageal reflux disease)   . Heart murmur   . History of hiatal hernia   . Hypertension   . Hypothyroidism    History reviewed. No pertinent family history. Past Surgical History:  Procedure Laterality Date  . APPENDECTOMY     82 yrs old  . CHOLECYSTECTOMY  95  . EYE SURGERY Bilateral 2017   cataract surgery  . JOINT REPLACEMENT Right    knee   . REVERSE SHOULDER ARTHROPLASTY Left 07/14/2014  . SHOULDER ARTHROSCOPY Left    Social History   Occupational History  . Not on file  Tobacco Use  . Smoking status: Never Smoker  . Smokeless tobacco: Never Used  Substance and Sexual Activity  . Alcohol use: No  . Drug use: No  . Sexual activity: Not on file

## 2016-12-03 DIAGNOSIS — M6281 Muscle weakness (generalized): Secondary | ICD-10-CM | POA: Diagnosis not present

## 2016-12-03 DIAGNOSIS — M25511 Pain in right shoulder: Secondary | ICD-10-CM | POA: Diagnosis not present

## 2016-12-03 DIAGNOSIS — Z96611 Presence of right artificial shoulder joint: Secondary | ICD-10-CM | POA: Diagnosis not present

## 2016-12-04 DIAGNOSIS — I10 Essential (primary) hypertension: Secondary | ICD-10-CM | POA: Diagnosis not present

## 2016-12-04 DIAGNOSIS — R011 Cardiac murmur, unspecified: Secondary | ICD-10-CM | POA: Diagnosis not present

## 2016-12-04 DIAGNOSIS — E1165 Type 2 diabetes mellitus with hyperglycemia: Secondary | ICD-10-CM | POA: Diagnosis not present

## 2016-12-04 DIAGNOSIS — M5440 Lumbago with sciatica, unspecified side: Secondary | ICD-10-CM | POA: Diagnosis not present

## 2016-12-04 DIAGNOSIS — E782 Mixed hyperlipidemia: Secondary | ICD-10-CM | POA: Diagnosis not present

## 2016-12-05 DIAGNOSIS — M25511 Pain in right shoulder: Secondary | ICD-10-CM | POA: Diagnosis not present

## 2016-12-05 DIAGNOSIS — Z96611 Presence of right artificial shoulder joint: Secondary | ICD-10-CM | POA: Diagnosis not present

## 2016-12-05 DIAGNOSIS — M6281 Muscle weakness (generalized): Secondary | ICD-10-CM | POA: Diagnosis not present

## 2016-12-08 ENCOUNTER — Encounter (INDEPENDENT_AMBULATORY_CARE_PROVIDER_SITE_OTHER): Payer: Self-pay | Admitting: Physical Medicine and Rehabilitation

## 2016-12-10 ENCOUNTER — Encounter (INDEPENDENT_AMBULATORY_CARE_PROVIDER_SITE_OTHER): Payer: Self-pay | Admitting: Physical Medicine and Rehabilitation

## 2016-12-10 ENCOUNTER — Ambulatory Visit (INDEPENDENT_AMBULATORY_CARE_PROVIDER_SITE_OTHER): Payer: Medicare Other

## 2016-12-10 ENCOUNTER — Ambulatory Visit (INDEPENDENT_AMBULATORY_CARE_PROVIDER_SITE_OTHER): Payer: Medicare Other | Admitting: Physical Medicine and Rehabilitation

## 2016-12-10 VITALS — BP 169/79 | HR 92 | Temp 97.8°F

## 2016-12-10 DIAGNOSIS — Z96611 Presence of right artificial shoulder joint: Secondary | ICD-10-CM | POA: Diagnosis not present

## 2016-12-10 DIAGNOSIS — M48061 Spinal stenosis, lumbar region without neurogenic claudication: Secondary | ICD-10-CM

## 2016-12-10 DIAGNOSIS — M6281 Muscle weakness (generalized): Secondary | ICD-10-CM | POA: Diagnosis not present

## 2016-12-10 DIAGNOSIS — M5416 Radiculopathy, lumbar region: Secondary | ICD-10-CM | POA: Diagnosis not present

## 2016-12-10 DIAGNOSIS — M25511 Pain in right shoulder: Secondary | ICD-10-CM | POA: Diagnosis not present

## 2016-12-10 MED ORDER — BETAMETHASONE SOD PHOS & ACET 6 (3-3) MG/ML IJ SUSP
12.0000 mg | Freq: Once | INTRAMUSCULAR | Status: AC
  Administered 2016-12-10: 12 mg

## 2016-12-10 MED ORDER — LIDOCAINE HCL (PF) 1 % IJ SOLN: 2.0000 mL | Freq: Once | INTRAMUSCULAR | Status: DC

## 2016-12-10 MED ORDER — ACETAMINOPHEN-CODEINE #3 300-30 MG PO TABS: 1.0000 | ORAL_TABLET | Freq: Three times a day (TID) | ORAL | 0 refills | Status: DC | PRN

## 2016-12-10 NOTE — Progress Notes (Deleted)
Planned bilateral L4 TF. No changes.

## 2016-12-10 NOTE — Patient Instructions (Signed)

## 2016-12-11 ENCOUNTER — Ambulatory Visit (INDEPENDENT_AMBULATORY_CARE_PROVIDER_SITE_OTHER): Payer: Medicare Other | Admitting: Orthopedic Surgery

## 2016-12-11 ENCOUNTER — Encounter (INDEPENDENT_AMBULATORY_CARE_PROVIDER_SITE_OTHER): Payer: Self-pay | Admitting: Orthopedic Surgery

## 2016-12-11 DIAGNOSIS — G8929 Other chronic pain: Secondary | ICD-10-CM

## 2016-12-11 DIAGNOSIS — M25511 Pain in right shoulder: Secondary | ICD-10-CM

## 2016-12-12 DIAGNOSIS — M6281 Muscle weakness (generalized): Secondary | ICD-10-CM | POA: Diagnosis not present

## 2016-12-12 DIAGNOSIS — M25511 Pain in right shoulder: Secondary | ICD-10-CM | POA: Diagnosis not present

## 2016-12-12 DIAGNOSIS — Z96611 Presence of right artificial shoulder joint: Secondary | ICD-10-CM | POA: Diagnosis not present

## 2016-12-13 NOTE — Procedures (Signed)
Megan [name] is an 82 year old female who we recently saw for evaluation and showing severe dural stenosis at L4-5.  We are going to complete bilateral L4 transforaminal epidural steroid injection.  Please see our prior evaluation and management note for further details and justification.  Lumbosacral Transforaminal Epidural Steroid Injection - Sub-Pedicular Approach with Fluoroscopic Guidance  [name]      Date of Birth: 1934/02/20 MRN: [id number] [facility]CP: [facility]      Visit Date: 12/10/2016   Universal Protocol:    Date/Time: 12/10/2016  Consent Given By: the patient  Position: PRONE  Additional Comments: Vital signs were monitored before and after the procedure. Patient was prepped and draped in the usual sterile fashion. The correct patient, procedure, and site was verified.   Injection Procedure Details:  Procedure Site One Meds Administered:  Meds ordered this encounter  Medications  . lidocaine (PF) (XYLOCAINE) 1 % injection 2 mL  . betamethasone acetate-betamethasone sodium phosphate (CELESTONE) injection 12 mg  . acetaminophen-codeine (TYLENOL #3) 300-30 MG tablet    Sig: Take 1 tablet every 8 (eight) hours as needed by mouth for moderate pain.    Dispense:  30 tablet    Refill:  0    Laterality: Bilateral  Location/Site:  L4-L5  Needle size: 22 G  Needle type: Spinal  Needle Placement: Transforaminal  Findings:  -Contrast Used: 0.5 mL iohexol 180 mg iodine/mL   -Comments: Excellent flow of contrast along the nerve and into the epidural space.  Procedure Details: After squaring off the end-plates to get a true AP view, the C-arm was positioned so that an oblique view of the foramen as noted above was visualized. The target area is just inferior to the "nose of the scotty dog" or sub pedicular. The soft tissues overlying this structure were infiltrated with 2-3 ml. of 1% Lidocaine without Epinephrine.  The spinal needle was  inserted toward the target using a "trajectory" view along the fluoroscope beam.  Under AP and lateral visualization, the needle was advanced so it did not puncture dura and was located close the 6 O'Clock position of the pedical in AP tracterory. Biplanar projections were used to confirm position. Aspiration was confirmed to be negative for CSF and/or blood. A 1-2 ml. volume of Isovue-250 was injected and flow of contrast was noted at each level. Radiographs were obtained for documentation purposes.   After attaining the desired flow of contrast documented above, a 0.5 to 1.0 ml test dose of 0.25% Marcaine was injected into each respective transforaminal space.  The patient was observed for 90 seconds post injection.  After no sensory deficits were reported, and normal lower extremity motor function was noted,   the above injectate was administered so that equal amounts of the injectate were placed at each foramen (level) into the transforaminal epidural space.   Additional Comments:  The patient tolerated the procedure well Dressing: Band-Aid    Post-procedure details: Patient was observed during the procedure. Post-procedure instructions were reviewed.  Patient left the clinic in stable condition.

## 2016-12-14 ENCOUNTER — Encounter (INDEPENDENT_AMBULATORY_CARE_PROVIDER_SITE_OTHER): Payer: Self-pay | Admitting: Orthopedic Surgery

## 2016-12-14 NOTE — Progress Notes (Signed)
   Post-Op Visit Note   [name]           Date of Birth: 04-29-1934           MRN: [id number] Visit Date: 12/11/2016 PCP: [facility]   Assessment & Plan:  Chief Complaint:  Chief Complaint  Patient presents with  . Right Shoulder - Follow-up   Visit Diagnoses:  1. Chronic right shoulder pain     Plan: Megan [name] is a patient is now 6 weeks out right reverse shoulder replacement doing well.  Still has some difficulty any range of motion but she is working on it.  She is in physical therapy 2 times a week.  Denies any acromial tenderness.  She is now taking Tylenol No. 3 for her back.  Shoulder is not really giving her much pain.  On exam she has improving range of motion and approaching 90 of forward flexion and abduction.  Plan is for 6 week return for clinical recheck at that time  Follow-Up Instructions: Return in about 6 weeks (around 01/22/2017).   Orders:  No orders of the defined types were placed in this encounter.  No orders of the defined types were placed in this encounter.   Imaging: No results found.  PMFS History: Patient Active Problem List   Diagnosis Date Noted  . Shoulder arthritis 10/29/2016  . Aortic stenosis 06/21/2016  . Lightheaded 06/05/2016  . Poor balance 06/05/2016  . Systolic murmur 62/03/5595  . Sensorineural hearing loss (SNHL), bilateral 02/02/2016  . Arthritis of shoulder region, left, degenerative 07/14/2014   Past Medical History:  Diagnosis Date  . Arthritis   . Cancer ([place]) 1989   melanoma  . Diabetes mellitus without complication ([place])   . Dizziness   . Dyspnea   . GERD (gastroesophageal reflux disease)   . Heart murmur   . History of hiatal hernia   . Hypertension   . Hypothyroidism     History reviewed. No pertinent family history.  Past Surgical History:  Procedure Laterality Date  . APPENDECTOMY     82 yrs old  . CHOLECYSTECTOMY  95  . EYE SURGERY Bilateral 2017   cataract surgery  .  JOINT REPLACEMENT Right    knee   . LEFT REVERSE SHOULDER ARTHROPLASTY Left 07/14/2014   Performed by Meredith Pel, MD at [facility] Left 07/14/2014  . RIGHT REVERSE TOTAL SHOULDER Right 10/29/2016   Performed by Meredith Pel, MD at [facility] ARTHROSCOPY Left    Social History   Occupational History  . Not on file  Tobacco Use  . Smoking status: Never Smoker  . Smokeless tobacco: Never Used  Substance and Sexual Activity  . Alcohol use: No  . Drug use: No  . Sexual activity: Not on file

## 2016-12-17 DIAGNOSIS — M6281 Muscle weakness (generalized): Secondary | ICD-10-CM | POA: Diagnosis not present

## 2016-12-17 DIAGNOSIS — M25511 Pain in right shoulder: Secondary | ICD-10-CM | POA: Diagnosis not present

## 2016-12-17 DIAGNOSIS — Z96611 Presence of right artificial shoulder joint: Secondary | ICD-10-CM | POA: Diagnosis not present

## 2016-12-24 DIAGNOSIS — Z96611 Presence of right artificial shoulder joint: Secondary | ICD-10-CM | POA: Diagnosis not present

## 2016-12-24 DIAGNOSIS — M6281 Muscle weakness (generalized): Secondary | ICD-10-CM | POA: Diagnosis not present

## 2016-12-24 DIAGNOSIS — M25511 Pain in right shoulder: Secondary | ICD-10-CM | POA: Diagnosis not present

## 2016-12-26 DIAGNOSIS — Z96611 Presence of right artificial shoulder joint: Secondary | ICD-10-CM | POA: Diagnosis not present

## 2016-12-26 DIAGNOSIS — M25511 Pain in right shoulder: Secondary | ICD-10-CM | POA: Diagnosis not present

## 2016-12-26 DIAGNOSIS — M6281 Muscle weakness (generalized): Secondary | ICD-10-CM | POA: Diagnosis not present

## 2016-12-31 DIAGNOSIS — M25511 Pain in right shoulder: Secondary | ICD-10-CM | POA: Diagnosis not present

## 2016-12-31 DIAGNOSIS — M6281 Muscle weakness (generalized): Secondary | ICD-10-CM | POA: Diagnosis not present

## 2016-12-31 DIAGNOSIS — Z96611 Presence of right artificial shoulder joint: Secondary | ICD-10-CM | POA: Diagnosis not present

## 2017-01-02 DIAGNOSIS — M25511 Pain in right shoulder: Secondary | ICD-10-CM | POA: Diagnosis not present

## 2017-01-02 DIAGNOSIS — Z96611 Presence of right artificial shoulder joint: Secondary | ICD-10-CM | POA: Diagnosis not present

## 2017-01-02 DIAGNOSIS — M6281 Muscle weakness (generalized): Secondary | ICD-10-CM | POA: Diagnosis not present

## 2017-01-09 DIAGNOSIS — M25511 Pain in right shoulder: Secondary | ICD-10-CM | POA: Diagnosis not present

## 2017-01-09 DIAGNOSIS — M6281 Muscle weakness (generalized): Secondary | ICD-10-CM | POA: Diagnosis not present

## 2017-01-09 DIAGNOSIS — Z96611 Presence of right artificial shoulder joint: Secondary | ICD-10-CM | POA: Diagnosis not present

## 2017-01-13 ENCOUNTER — Ambulatory Visit (HOSPITAL_COMMUNITY): Payer: Medicare Other | Attending: Cardiovascular Disease

## 2017-01-13 ENCOUNTER — Other Ambulatory Visit: Payer: Self-pay

## 2017-01-13 DIAGNOSIS — I119 Hypertensive heart disease without heart failure: Secondary | ICD-10-CM | POA: Diagnosis not present

## 2017-01-13 DIAGNOSIS — R42 Dizziness and giddiness: Secondary | ICD-10-CM | POA: Diagnosis not present

## 2017-01-13 DIAGNOSIS — E119 Type 2 diabetes mellitus without complications: Secondary | ICD-10-CM | POA: Insufficient documentation

## 2017-01-13 DIAGNOSIS — I350 Nonrheumatic aortic (valve) stenosis: Secondary | ICD-10-CM | POA: Diagnosis not present

## 2017-01-13 DIAGNOSIS — Z01818 Encounter for other preprocedural examination: Secondary | ICD-10-CM | POA: Insufficient documentation

## 2017-01-13 DIAGNOSIS — I080 Rheumatic disorders of both mitral and aortic valves: Secondary | ICD-10-CM | POA: Diagnosis not present

## 2017-01-13 LAB — ECHOCARDIOGRAM COMPLETE
AOPV: 0.34 m/s
AV Area VTI index: 0.55 cm2/m2
AV Area mean vel: 1.06 cm2
AV Mean grad: 28 mmHg
AV Peak grad: 57 mmHg
AV area mean vel ind: 0.5 cm2/m2
AV peak Index: 0.5
AV pk vel: 377 cm/s
AV vel: 1.18
AVAREAVTI: 1.07 cm2
AVCELMEANRAT: 0.34
CHL CUP AV VALUE AREA INDEX: 0.55
CHL CUP DOP CALC LVOT VTI: 28.8 cm
DOP CAL AO MEAN VELOCITY: 238 cm/s
E decel time: 324 msec
E/e' ratio: 20.65
FS: 50 % — AB (ref 28–44)
IVS/LV PW RATIO, ED: 1.27
LA vol A4C: 66.1 ml
LA vol: 62 mL
LADIAMINDEX: 1.87 cm/m2
LASIZE: 40 mm
LAVOLIN: 28.9 mL/m2
LDCA: 3.14 cm2
LEFT ATRIUM END SYS DIAM: 40 mm
LV E/e' medial: 20.65
LV E/e'average: 20.65
LV PW d: 12.4 mm — AB (ref 0.6–1.1)
LVELAT: 7.07 cm/s
LVOT SV: 90 mL
LVOT peak VTI: 0.38 cm
LVOT peak grad rest: 7 mmHg
LVOTD: 20 mm
LVOTPV: 129 cm/s
MV Dec: 324
MV Peak grad: 9 mmHg
MVPKAVEL: 156 m/s
MVPKEVEL: 146 m/s
RV LATERAL S' VELOCITY: 10.9 cm/s
TDI e' lateral: 7.07
TDI e' medial: 5.44
VTI: 76.8 cm
Valve area: 1.18 cm2

## 2017-01-14 ENCOUNTER — Telehealth: Payer: Self-pay

## 2017-01-14 DIAGNOSIS — E039 Hypothyroidism, unspecified: Secondary | ICD-10-CM | POA: Diagnosis not present

## 2017-01-14 DIAGNOSIS — E119 Type 2 diabetes mellitus without complications: Secondary | ICD-10-CM | POA: Diagnosis not present

## 2017-01-14 DIAGNOSIS — I350 Nonrheumatic aortic (valve) stenosis: Secondary | ICD-10-CM

## 2017-01-14 NOTE — Telephone Encounter (Signed)
-----   Message from Josue Hector, MD sent at 01/13/2017  2:05 PM EST ----- Moderate AS stable since may f/u echo in a year

## 2017-01-14 NOTE — Telephone Encounter (Signed)
Patient aware of echo results. Per Dr. Johnsie Cancel, Moderate AS stable since may f/u echo in a year. Patient verbalized understanding.

## 2017-01-15 ENCOUNTER — Encounter (INDEPENDENT_AMBULATORY_CARE_PROVIDER_SITE_OTHER): Payer: Self-pay | Admitting: Specialist

## 2017-01-15 ENCOUNTER — Ambulatory Visit (INDEPENDENT_AMBULATORY_CARE_PROVIDER_SITE_OTHER): Payer: Medicare Other

## 2017-01-15 ENCOUNTER — Ambulatory Visit (INDEPENDENT_AMBULATORY_CARE_PROVIDER_SITE_OTHER): Payer: Medicare Other | Admitting: Specialist

## 2017-01-15 VITALS — BP 143/73 | HR 83 | Ht 63.5 in | Wt 206.0 lb

## 2017-01-15 DIAGNOSIS — M48062 Spinal stenosis, lumbar region with neurogenic claudication: Secondary | ICD-10-CM | POA: Diagnosis not present

## 2017-01-15 DIAGNOSIS — M545 Low back pain: Secondary | ICD-10-CM | POA: Diagnosis not present

## 2017-01-15 DIAGNOSIS — M4316 Spondylolisthesis, lumbar region: Secondary | ICD-10-CM | POA: Diagnosis not present

## 2017-01-15 MED ORDER — GABAPENTIN 100 MG PO CAPS: 100.0000 mg | ORAL_CAPSULE | Freq: Two times a day (BID) | ORAL | 0 refills | Status: DC

## 2017-01-15 NOTE — Progress Notes (Signed)
Office Visit Note   [name]           Date of Birth: 1934-03-08           MRN: [id number] Visit Date: 01/15/2017              Requested by: Rolley Sims [facility] [address] [place], [place] [address] [facility]CP: [facility]   Assessment & Plan: Visit Diagnoses:  1. Low back pain, unspecified back pain laterality, unspecified chronicity, with sciatica presence unspecified   2. Spinal stenosis, lumbar region, with neurogenic claudication   3. Spondylolisthesis of lumbar region     Plan: Avoid bending, stooping and avoid lifting weights greater than 10 lbs. Avoid prolong standing and walking. Avoid frequent bending and stooping  No lifting greater than 10 lbs. May use ice or moist heat for pain. Weight loss is of benefit. Handicap license is approved. Call us and we would schedule with Dr. Romona Curls secretary to arrange for epidural steroid injection    Follow-Up Instructions: Return in about 4 weeks (around 02/12/2017).   Orders:  Orders Placed This Encounter  Procedures  . XR Lumbar Spine 2-3 Views   No orders of the defined types were placed in this encounter.     Procedures: No procedures performed   Clinical Data: No additional findings.   Subjective: Chief Complaint  Patient presents with  . Lower Back - Pain    82 year old female with history of recent right shoulder arthroplasty, history of HTN and diabetes on oral meds and diet controlled. She is experiencing difficulty with prolong standing and walking. She has aortic stenosis and complains of SOB with prolong walking, she can do well sometimes but not at others, she notices difficulty with ascending greater than down stairs. She is able to grocery shop and has to sit. When she is grocery shopping she is fine, does not have to sit or use a cart to lean on. It is mostly problems with strenuous walking or  Stair climbing. She flew to [place], [place]. She was out of  breathe quite a bit. Her legs do not feel weak but has feelings of numbness in both feet, they are asleep. She notices with sitting down and with taking off her shoes and socks. No bowel difficulties, uses Myrbetriq for bladder incontinence. She was referred by Dr. Ernestina Patches for assessment of her lumbar spine. He back limits nothing in particular except when it is hurting. When it is painful she hurts in the legs from the knee downwards. In the past has taken injections if the back 3 sets of 3 without difficulties until the legs start hurting again in May and June and [place]. Then after MRI another area was injected. She feels as though her strength is stable. Standing to do dishes the legs start to ache.    Review of Systems  Constitutional: Positive for activity change.  HENT: Positive for congestion, rhinorrhea, sinus pressure, sinus pain and sneezing. Negative for trouble swallowing and voice change.   Eyes: Positive for visual disturbance. Negative for photophobia, pain, discharge, redness and itching.  Respiratory: Positive for cough, shortness of breath and wheezing. Negative for apnea, choking and chest tightness.   Cardiovascular: Positive for chest pain. Negative for palpitations and leg swelling.  Gastrointestinal: Positive for constipation. Negative for abdominal distention, abdominal pain, anal bleeding, diarrhea, nausea, rectal pain and vomiting.  Endocrine: Negative.   Genitourinary: Positive for enuresis, frequency and pelvic pain. Negative for difficulty urinating, dyspareunia,  dysuria and flank pain.  Musculoskeletal: Positive for arthralgias, back pain and gait problem. Negative for joint swelling, myalgias, neck pain and neck stiffness.  Skin: Negative.  Negative for color change, pallor, rash and wound.  Allergic/Immunologic: Negative.  Negative for environmental allergies, food allergies and immunocompromised state.  Neurological: Positive for weakness. Negative for dizziness,  tremors, seizures, syncope, facial asymmetry, speech difficulty, light-headedness, numbness and headaches.  Hematological: Negative.  Negative for adenopathy. Does not bruise/bleed easily.  Psychiatric/Behavioral: Negative.  Negative for agitation, behavioral problems, confusion, decreased concentration, dysphoric mood, hallucinations, self-injury, sleep disturbance and suicidal ideas. The patient is not nervous/anxious and is not hyperactive.      Objective: Vital Signs: BP (!) 143/73 (BP Location: Left Arm, Patient Position: Sitting)   Pulse 83   Ht 5' 3.5" (1.613 m)   Wt 206 lb (93.4 kg)   LMP  (LMP Unknown)   BMI 35.92 kg/m   Physical Exam  Back Exam   Tenderness  The patient is experiencing tenderness in the lumbar.  Range of Motion  Lateral bend right: abnormal  Lateral bend left: abnormal  Rotation right: abnormal  Rotation left: abnormal   Muscle Strength  Right Quadriceps:  5/5  Left Quadriceps:  5/5  Right Hamstrings:  5/5  Left Hamstrings:  5/5   Tests  Straight leg raise right: negative Straight leg raise left: negative  Reflexes  Patellar: normal Achilles: normal Biceps: normal Babinski's sign: normal   Other  Toe walk: normal Heel walk: normal Sensation: decreased Gait: normal  Erythema: no back redness Scars: absent  Comments:  No motor weakness either leg with sitting.       Specialty Comments:  No specialty comments available.  Imaging: No results found.   PMFS History: Patient Active Problem List   Diagnosis Date Noted  . Shoulder arthritis 10/29/2016  . Aortic stenosis 06/21/2016  . Lightheaded 06/05/2016  . Poor balance 06/05/2016  . Systolic murmur 19/62/2297  . Sensorineural hearing loss (SNHL), bilateral 02/02/2016  . Arthritis of shoulder region, left, degenerative 07/14/2014   Past Medical History:  Diagnosis Date  . Arthritis   . Cancer ([place]) 1989   melanoma  . Diabetes mellitus without complication ([place])   .  Dizziness   . Dyspnea   . GERD (gastroesophageal reflux disease)   . Heart murmur   . History of hiatal hernia   . Hypertension   . Hypothyroidism     History reviewed. No pertinent family history.  Past Surgical History:  Procedure Laterality Date  . APPENDECTOMY     82 yrs old  . CHOLECYSTECTOMY  95  . EYE SURGERY Bilateral 2017   cataract surgery  . JOINT REPLACEMENT Right    knee   . REVERSE SHOULDER ARTHROPLASTY Left 07/14/2014  . REVERSE SHOULDER ARTHROPLASTY Left 07/14/2014   Procedure: LEFT REVERSE SHOULDER ARTHROPLASTY;  Surgeon: Meredith Pel, MD;  Location: [facility];  Service: Orthopedics;  Laterality: Left;  . SHOULDER ARTHROSCOPY Left   . TOTAL SHOULDER REVISION Right 10/29/2016   Procedure: RIGHT REVERSE TOTAL SHOULDER;  Surgeon: Meredith Pel, MD;  Location: [facility];  Service: Orthopedics;  Laterality: Right;   Social History   Occupational History  . Not on file  Tobacco Use  . Smoking status: Never Smoker  . Smokeless tobacco: Never Used  Substance and Sexual Activity  . Alcohol use: No  . Drug use: No  . Sexual activity: Not on file

## 2017-01-15 NOTE — Patient Instructions (Addendum)
Avoid bending, stooping and avoid lifting weights greater than 10 lbs. Avoid prolong standing and walking. Avoid frequent bending and stooping  No lifting greater than 10 lbs. May use ice or moist heat for pain. Weight loss is of benefit. Physical therapy to try and improve her standing and walking tolerance. Handicap license is approved. Call us and we would schedule with Dr. Romona Curls secretary to arrange for epidural steroid injection

## 2017-01-16 DIAGNOSIS — M25511 Pain in right shoulder: Secondary | ICD-10-CM | POA: Diagnosis not present

## 2017-01-16 DIAGNOSIS — Z96611 Presence of right artificial shoulder joint: Secondary | ICD-10-CM | POA: Diagnosis not present

## 2017-01-16 DIAGNOSIS — M545 Low back pain: Secondary | ICD-10-CM | POA: Diagnosis not present

## 2017-01-16 DIAGNOSIS — M6281 Muscle weakness (generalized): Secondary | ICD-10-CM | POA: Diagnosis not present

## 2017-01-22 ENCOUNTER — Ambulatory Visit (INDEPENDENT_AMBULATORY_CARE_PROVIDER_SITE_OTHER): Payer: Medicare Other | Admitting: Orthopedic Surgery

## 2017-01-22 ENCOUNTER — Encounter (INDEPENDENT_AMBULATORY_CARE_PROVIDER_SITE_OTHER): Payer: Self-pay | Admitting: Orthopedic Surgery

## 2017-01-22 DIAGNOSIS — Z96611 Presence of right artificial shoulder joint: Secondary | ICD-10-CM | POA: Diagnosis not present

## 2017-01-22 DIAGNOSIS — M6281 Muscle weakness (generalized): Secondary | ICD-10-CM | POA: Diagnosis not present

## 2017-01-22 DIAGNOSIS — M545 Low back pain: Secondary | ICD-10-CM | POA: Diagnosis not present

## 2017-01-22 DIAGNOSIS — M25511 Pain in right shoulder: Secondary | ICD-10-CM | POA: Diagnosis not present

## 2017-01-22 NOTE — Progress Notes (Signed)
   Post-Op Visit Note   [name]           Date of Birth: 10/05/1934           MRN: [id number] Visit Date: 01/22/2017 PCP: [facility]   Assessment & Plan:  Chief Complaint:  Chief Complaint  Patient presents with  . Right Shoulder - Follow-up   Visit Diagnoses:  1. Status post reverse total replacement of right shoulder     Plan: Megan [name] is a patient who is now 3 months out right reverse shoulder replacement.  In general she is doing well.  Hard for her to sleep on that shoulder for too long.  She is in physical therapy but they have released her to a home exercise program.  She is having some back issues and has a diagnosis of aortic stenosis.  She is getting another opinion about her back from the neurosurgeon's.  On exam she has forward flexion and abduction both shoulders above 90 degrees.  No real pain or crepitus with motion.  Motor sensory function to the hand is intact bilaterally.  Plan at this time is that she is doing well with her shoulder replacements.  She is going to get another opinion from the neurosurgeons about her back.  Continue with home exercise program for 3 months and I will see her back as needed  Follow-Up Instructions: Return if symptoms worsen or fail to improve.   Orders:  No orders of the defined types were placed in this encounter.  No orders of the defined types were placed in this encounter.   Imaging: No results found.  PMFS History: Patient Active Problem List   Diagnosis Date Noted  . Shoulder arthritis 10/29/2016  . Aortic stenosis 06/21/2016  . Lightheaded 06/05/2016  . Poor balance 06/05/2016  . Systolic murmur 40/98/1191  . Sensorineural hearing loss (SNHL), bilateral 02/02/2016  . Arthritis of shoulder region, left, degenerative 07/14/2014   Past Medical History:  Diagnosis Date  . Arthritis   . Cancer ([place]) 1989   melanoma  . Diabetes mellitus without complication ([place])   . Dizziness   .  Dyspnea   . GERD (gastroesophageal reflux disease)   . Heart murmur   . History of hiatal hernia   . Hypertension   . Hypothyroidism     History reviewed. No pertinent family history.  Past Surgical History:  Procedure Laterality Date  . APPENDECTOMY     82 yrs old  . CHOLECYSTECTOMY  89  . EYE SURGERY Bilateral 2017   cataract surgery  . JOINT REPLACEMENT Right    knee   . REVERSE SHOULDER ARTHROPLASTY Left 07/14/2014  . REVERSE SHOULDER ARTHROPLASTY Left 07/14/2014   Procedure: LEFT REVERSE SHOULDER ARTHROPLASTY;  Surgeon: Meredith Pel, MD;  Location: [facility];  Service: Orthopedics;  Laterality: Left;  . SHOULDER ARTHROSCOPY Left   . TOTAL SHOULDER REVISION Right 10/29/2016   Procedure: RIGHT REVERSE TOTAL SHOULDER;  Surgeon: Meredith Pel, MD;  Location: [facility];  Service: Orthopedics;  Laterality: Right;   Social History   Occupational History  . Not on file  Tobacco Use  . Smoking status: Never Smoker  . Smokeless tobacco: Never Used  Substance and Sexual Activity  . Alcohol use: No  . Drug use: No  . Sexual activity: Not on file

## 2017-01-27 NOTE — Progress Notes (Signed)
Cardiology Office Note   Date:  02/04/2017   ID:  Megan [name], DOB 1934-10-19, MRN [id number]  PCP:  [facility]  Cardiologist:   Jenkins Rouge, MD   No chief complaint on file.     History of Present Illness: Megan [name] is a 82 y.o. female who presents for f/u  aortic stenosis .   She has significant orthopedic issues followed by Dr Marlou Sa Now s/p bilateral shoulder replacements most recently on right 10/30/16  Sees neurology and ENT for dizziness negative w/u MRI for stroke   CRF;s DM and HTN  No documented CAD Myovue study normal 07/23/16 with EF 74%   Reviewed echo done 06/20/16 EF normal mild LVH Moderate AS and mild AR mean gradient 31 mmHg peak 64 mmHg  Mild to moderate MR Estimated PA 33 mmHg   F/U echo 01/13/17 with mean gradient 28 mmHg peak 57 mmHg stable moderate AS  Mild MR  She has back issues and was told she would need an 8 hour surgery She is getting a 2nd opinion  .   Past Medical History:  Diagnosis Date  . Arthritis   . Cancer ([place]) 1989   melanoma  . Diabetes mellitus without complication ([place])   . Dizziness   . Dyspnea   . GERD (gastroesophageal reflux disease)   . Heart murmur   . History of hiatal hernia   . Hypertension   . Hypothyroidism     Past Surgical History:  Procedure Laterality Date  . APPENDECTOMY     82 yrs old  . CHOLECYSTECTOMY  89  . EYE SURGERY Bilateral 2017   cataract surgery  . JOINT REPLACEMENT Right    knee   . REVERSE SHOULDER ARTHROPLASTY Left 07/14/2014  . REVERSE SHOULDER ARTHROPLASTY Left 07/14/2014   Procedure: LEFT REVERSE SHOULDER ARTHROPLASTY;  Surgeon: Meredith Pel, MD;  Location: [facility];  Service: Orthopedics;  Laterality: Left;  . SHOULDER ARTHROSCOPY Left   . TOTAL SHOULDER REVISION Right 10/29/2016   Procedure: RIGHT REVERSE TOTAL SHOULDER;  Surgeon: Meredith Pel, MD;  Location: [facility];  Service: Orthopedics;  Laterality: Right;     Current Outpatient  Medications  Medication Sig Dispense Refill  . ACCU-CHEK AVIVA PLUS test strip 1 each by Other route 2 (two) times daily.     Marland Kitchen ACCU-CHEK SOFTCLIX LANCETS lancets 1 each by Other route 2 (two) times daily.     Marland Kitchen acetaminophen-codeine (TYLENOL #3) 300-30 MG tablet Take 1 tablet every 8 (eight) hours as needed by mouth for moderate pain. 30 tablet 0  . amLODipine-valsartan (EXFORGE) 5-160 MG per tablet Take 1 tablet by mouth daily.   12  . calcium-vitamin D (OSCAL WITH D) 500-200 MG-UNIT tablet Take 1 tablet by mouth at bedtime.    . clopidogrel (PLAVIX) 75 MG tablet Take 75 mg by mouth at bedtime.     Marland Kitchen econazole nitrate 1 % cream Apply 1 application topically daily. For nose/corners of mouth    . gabapentin (NEURONTIN) 100 MG capsule Take 1 capsule (100 mg total) by mouth 2 (two) times daily. 180 capsule 0  . glimepiride (AMARYL) 2 MG tablet Take 4 mg by mouth daily with breakfast.   12  . hydrocortisone 2.5 % cream Apply 1 application topically daily. Applied to nose/corners of mouth    . ketotifen (ZADITOR) 0.025 % ophthalmic solution Place 1 drop into both eyes 2 (two) times daily as needed (for itchy eyes.).    Marland Kitchen  Multiple Vitamin (MULTIVITAMIN WITH MINERALS) TABS tablet Take 1 tablet by mouth at bedtime.    Marland Kitchen MYRBETRIQ 50 MG TB24 tablet Take 50 mg by mouth daily.  11  . Omega-3 Fatty Acids (FISH OIL) 1200 MG CAPS Take 1,200 mg by mouth daily with breakfast.     . Polyvinyl Alcohol-Povidone (REFRESH OP) Place 1 drop into both eyes 3 (three) times daily as needed (dry eyes).     . ranitidine (ZANTAC) 150 MG tablet Take 150 mg by mouth 2 (two) times daily.     . rosuvastatin (CRESTOR) 5 MG tablet Take 5 mg by mouth daily.    . sitaGLIPtin (JANUVIA) 100 MG tablet Take 100 mg by mouth daily.    Marland Kitchen SYNTHROID 125 MCG tablet Take 125 mcg by mouth daily before breakfast.   12  . triamterene-hydrochlorothiazide (DYAZIDE) 37.5-25 MG per capsule Take 1 capsule by mouth daily as needed (swelling).   11    Current Facility-Administered Medications  Medication Dose Route Frequency Provider Last Rate Last Dose  . lidocaine (PF) (XYLOCAINE) 1 % injection 2 mL  2 mL Other Once Magnus Sinning, MD        Allergies:   Patient has no known allergies.    Social History:  The patient  reports that  has never smoked. she has never used smokeless tobacco. She reports that she does not drink alcohol or use drugs.   Family History:  The patient's family history is not on file.    ROS:  Please see the history of present illness.   Otherwise, review of systems are positive for none.   All other systems are reviewed and negative.    PHYSICAL EXAM: VS:  BP (!) 156/86   Pulse 84   Ht 5\' 4"  (1.626 m)   Wt 209 lb (94.8 kg)   LMP  (LMP Unknown)   SpO2 94%   BMI 35.87 kg/m  , BMI Body mass index is 35.87 kg/m. Affect appropriate Elderly white female  HEENT: normal Neck supple with no adenopathy JVP normal no bruits no thyromegaly Lungs clear with no wheezing and good diaphragmatic motion Heart:  S1/S2 preserved moderate AS murmur, no rub, gallop or click PMI normal Abdomen: benighn, BS positve, no tenderness, no AAA no bruit.  No HSM or HJR Distal pulses intact with no bruits No edema Neuro non-focal Skin warm and dry Post bilateral shoulder replacement      EKG:   07/18/16  NSR rate 87 RBBB    Recent Labs: 10/24/2016: BUN 12; Creatinine, Ser 0.76; Hemoglobin 13.3; Platelets 311; Potassium 3.7; Sodium 137    Lipid Panel No results found for: CHOL, TRIG, HDL, CHOLHDL, VLDL, LDLCALC, LDLDIRECT    Wt Readings from Last 3 Encounters:  02/04/17 209 lb (94.8 kg)  01/15/17 206 lb (93.4 kg)  10/29/16 215 lb (97.5 kg)      Other studies Reviewed: Additional studies/ records that were reviewed today include: Notes from primary and neurology as well As CT, echo labs and ENT notes Dr Janace Hoard. .    ASSESSMENT AND PLAN:  1. Aortic Stenosis  Moderate f/u echo 12/2017  at some point  may be a TAVR candidate given age She had a low risk myovue in June 2018 and would be cleared for reasonable back surgery  2. Dizziness non cardiac not postural in office follow 3. HTN  Well controlled.  Continue current medications and low sodium Dash type diet.   4 DM Discussed low carb diet.  Target hemoglobin A1c is 6.5 or less.  Continue current medications. 5. RBBB chronic no high grade AV block yearly ECG  6. Ortho: post bilateral shoulder replacements PT/OT f/u Dr Marlou Sa will be getting 2nd opinion about back issues   Current medicines are reviewed at length with the patient today.  The patient does not have concerns regarding medicines.  The following changes have been made:  no change  Labs/ tests ordered today include: Echo in December 2019  No orders of the defined types were placed in this encounter.    Disposition:   FU with me in a year with echo      Signed, Jenkins Rouge, MD  02/04/2017 10:40 AM    [address], [place], [place]  [address] Phone: [phone]; Fax: [phone]

## 2017-02-04 ENCOUNTER — Ambulatory Visit: Payer: Medicare Other | Admitting: Cardiovascular Disease

## 2017-02-04 ENCOUNTER — Encounter: Payer: Self-pay | Admitting: Cardiovascular Disease

## 2017-02-04 VITALS — BP 156/86 | HR 84 | Ht 64.0 in | Wt 209.0 lb

## 2017-02-04 DIAGNOSIS — I350 Nonrheumatic aortic (valve) stenosis: Secondary | ICD-10-CM

## 2017-02-04 DIAGNOSIS — I10 Essential (primary) hypertension: Secondary | ICD-10-CM

## 2017-02-04 DIAGNOSIS — I4510 Unspecified right bundle-branch block: Secondary | ICD-10-CM

## 2017-02-04 NOTE — Patient Instructions (Addendum)
Medication Instructions:  Your physician recommends that you continue on your current medications as directed. Please refer to the Current Medication list given to you today.  Labwork: NONE  Testing/Procedures: Your physician has requested that you have an echocardiogram in December 2019. Echocardiography is a painless test that uses sound waves to create images of your heart. It provides your doctor with information about the size and shape of your heart and how well your heart's chambers and valves are working. This procedure takes approximately one hour. There are no restrictions for this procedure.  Follow-Up: Your physician wants you to follow-up in: December 2019 with Dr. Johnsie Cancel. You will receive a reminder letter in the mail two months in advance. If you don't receive a letter, please call our office to schedule the follow-up appointment.   If you need a refill on your cardiac medications before your next appointment, please call your pharmacy.

## 2017-02-05 DIAGNOSIS — H35373 Puckering of macula, bilateral: Secondary | ICD-10-CM | POA: Diagnosis not present

## 2017-02-05 DIAGNOSIS — H47021 Hemorrhage in optic nerve sheath, right eye: Secondary | ICD-10-CM | POA: Diagnosis not present

## 2017-02-05 DIAGNOSIS — E119 Type 2 diabetes mellitus without complications: Secondary | ICD-10-CM | POA: Diagnosis not present

## 2017-02-05 DIAGNOSIS — H469 Unspecified optic neuritis: Secondary | ICD-10-CM | POA: Diagnosis not present

## 2017-02-05 DIAGNOSIS — H35033 Hypertensive retinopathy, bilateral: Secondary | ICD-10-CM | POA: Diagnosis not present

## 2017-02-07 DIAGNOSIS — H47021 Hemorrhage in optic nerve sheath, right eye: Secondary | ICD-10-CM | POA: Diagnosis not present

## 2017-02-07 DIAGNOSIS — E119 Type 2 diabetes mellitus without complications: Secondary | ICD-10-CM | POA: Diagnosis not present

## 2017-02-07 DIAGNOSIS — H35033 Hypertensive retinopathy, bilateral: Secondary | ICD-10-CM | POA: Diagnosis not present

## 2017-02-07 DIAGNOSIS — H469 Unspecified optic neuritis: Secondary | ICD-10-CM | POA: Diagnosis not present

## 2017-02-12 ENCOUNTER — Ambulatory Visit (INDEPENDENT_AMBULATORY_CARE_PROVIDER_SITE_OTHER): Payer: Medicare Other | Admitting: Specialist

## 2017-02-18 DIAGNOSIS — H47011 Ischemic optic neuropathy, right eye: Secondary | ICD-10-CM | POA: Diagnosis not present

## 2017-02-18 DIAGNOSIS — H53431 Sector or arcuate defects, right eye: Secondary | ICD-10-CM | POA: Diagnosis not present

## 2017-02-18 DIAGNOSIS — H5340 Unspecified visual field defects: Secondary | ICD-10-CM | POA: Diagnosis not present

## 2017-02-18 DIAGNOSIS — H4710 Unspecified papilledema: Secondary | ICD-10-CM | POA: Diagnosis not present

## 2017-02-23 ENCOUNTER — Other Ambulatory Visit (INDEPENDENT_AMBULATORY_CARE_PROVIDER_SITE_OTHER): Payer: Self-pay | Admitting: Physical Medicine and Rehabilitation

## 2017-02-24 ENCOUNTER — Other Ambulatory Visit (INDEPENDENT_AMBULATORY_CARE_PROVIDER_SITE_OTHER): Payer: Self-pay | Admitting: Specialist

## 2017-02-24 MED ORDER — GABAPENTIN 100 MG PO CAPS: 100.0000 mg | ORAL_CAPSULE | Freq: Two times a day (BID) | ORAL | 0 refills | Status: DC

## 2017-02-24 NOTE — Telephone Encounter (Signed)
Please advise 

## 2017-02-24 NOTE — Telephone Encounter (Signed)
Gabapentin refill Request 

## 2017-02-24 NOTE — Telephone Encounter (Signed)
Looks Like [facility]?

## 2017-02-24 NOTE — Telephone Encounter (Signed)
Please see previous messages. ? [facility]

## 2017-03-03 ENCOUNTER — Other Ambulatory Visit: Payer: Self-pay | Admitting: Internal Medicine

## 2017-03-03 ENCOUNTER — Other Ambulatory Visit: Payer: Self-pay | Admitting: Endocrinology

## 2017-03-03 DIAGNOSIS — Z139 Encounter for screening, unspecified: Secondary | ICD-10-CM

## 2017-03-20 DIAGNOSIS — H35033 Hypertensive retinopathy, bilateral: Secondary | ICD-10-CM | POA: Diagnosis not present

## 2017-03-20 DIAGNOSIS — H469 Unspecified optic neuritis: Secondary | ICD-10-CM | POA: Diagnosis not present

## 2017-03-20 DIAGNOSIS — I10 Essential (primary) hypertension: Secondary | ICD-10-CM | POA: Diagnosis not present

## 2017-03-20 DIAGNOSIS — H47021 Hemorrhage in optic nerve sheath, right eye: Secondary | ICD-10-CM | POA: Diagnosis not present

## 2017-03-20 DIAGNOSIS — H21561 Pupillary abnormality, right eye: Secondary | ICD-10-CM | POA: Diagnosis not present

## 2017-03-20 DIAGNOSIS — H5340 Unspecified visual field defects: Secondary | ICD-10-CM | POA: Diagnosis not present

## 2017-03-20 LAB — HM DIABETES EYE EXAM

## 2017-03-21 ENCOUNTER — Ambulatory Visit
Admission: RE | Admit: 2017-03-21 | Discharge: 2017-03-21 | Disposition: A | Discharge status: Home / Self Care | Payer: Medicare Other | Source: Ambulatory Visit | Attending: Internal Medicine | Admitting: Internal Medicine

## 2017-03-21 DIAGNOSIS — Z139 Encounter for screening, unspecified: Secondary | ICD-10-CM

## 2017-03-21 DIAGNOSIS — Z1231 Encounter for screening mammogram for malignant neoplasm of breast: Secondary | ICD-10-CM | POA: Diagnosis not present

## 2017-03-24 DIAGNOSIS — E119 Type 2 diabetes mellitus without complications: Secondary | ICD-10-CM | POA: Diagnosis not present

## 2017-03-24 DIAGNOSIS — E039 Hypothyroidism, unspecified: Secondary | ICD-10-CM | POA: Diagnosis not present

## 2017-04-09 DIAGNOSIS — I10 Essential (primary) hypertension: Secondary | ICD-10-CM | POA: Diagnosis not present

## 2017-04-09 DIAGNOSIS — Z0000 Encounter for general adult medical examination without abnormal findings: Secondary | ICD-10-CM | POA: Diagnosis not present

## 2017-04-09 DIAGNOSIS — E1165 Type 2 diabetes mellitus with hyperglycemia: Secondary | ICD-10-CM | POA: Diagnosis not present

## 2017-04-09 DIAGNOSIS — E782 Mixed hyperlipidemia: Secondary | ICD-10-CM | POA: Diagnosis not present

## 2017-04-12 ENCOUNTER — Other Ambulatory Visit (INDEPENDENT_AMBULATORY_CARE_PROVIDER_SITE_OTHER): Payer: Self-pay | Admitting: Specialist

## 2017-04-14 NOTE — Telephone Encounter (Signed)
Gabapentin refill request 

## 2017-04-16 DIAGNOSIS — E118 Type 2 diabetes mellitus with unspecified complications: Secondary | ICD-10-CM | POA: Diagnosis not present

## 2017-04-16 DIAGNOSIS — Z780 Asymptomatic menopausal state: Secondary | ICD-10-CM | POA: Diagnosis not present

## 2017-04-16 DIAGNOSIS — I2510 Atherosclerotic heart disease of native coronary artery without angina pectoris: Secondary | ICD-10-CM | POA: Diagnosis not present

## 2017-04-16 DIAGNOSIS — E1165 Type 2 diabetes mellitus with hyperglycemia: Secondary | ICD-10-CM | POA: Diagnosis not present

## 2017-04-16 DIAGNOSIS — I10 Essential (primary) hypertension: Secondary | ICD-10-CM | POA: Diagnosis not present

## 2017-04-16 DIAGNOSIS — Z23 Encounter for immunization: Secondary | ICD-10-CM | POA: Diagnosis not present

## 2017-04-16 DIAGNOSIS — E782 Mixed hyperlipidemia: Secondary | ICD-10-CM | POA: Diagnosis not present

## 2017-04-21 DIAGNOSIS — E039 Hypothyroidism, unspecified: Secondary | ICD-10-CM | POA: Diagnosis not present

## 2017-04-21 DIAGNOSIS — E118 Type 2 diabetes mellitus with unspecified complications: Secondary | ICD-10-CM | POA: Diagnosis not present

## 2017-04-21 DIAGNOSIS — H35039 Hypertensive retinopathy, unspecified eye: Secondary | ICD-10-CM | POA: Diagnosis not present

## 2017-04-21 DIAGNOSIS — I10 Essential (primary) hypertension: Secondary | ICD-10-CM | POA: Diagnosis not present

## 2017-04-21 DIAGNOSIS — E782 Mixed hyperlipidemia: Secondary | ICD-10-CM | POA: Diagnosis not present

## 2017-05-07 ENCOUNTER — Encounter: Payer: Medicare Other | Attending: Internal Medicine | Admitting: *Deleted

## 2017-05-07 DIAGNOSIS — E119 Type 2 diabetes mellitus without complications: Secondary | ICD-10-CM | POA: Insufficient documentation

## 2017-05-07 DIAGNOSIS — Z713 Dietary counseling and surveillance: Secondary | ICD-10-CM | POA: Insufficient documentation

## 2017-05-07 NOTE — Patient Instructions (Signed)
Plan:  Aim for 2 Carb Choices per meal (30 grams) +/- 1 either way  Aim for 0-1 Carbs per snack if hungry  Try having an evening snack to see if that helps you sleep better and if it brings your morning BG down some. Include protein in moderation with your meals and snacks Consider  increasing your activity level by walking, stationary bike or maybe walking in shallow end of the pool for 15-30 minutes as tolerated Continue checking BG at alternate times per day as directed by MD  Continue taking medication as directed by MD

## 2017-05-07 NOTE — Progress Notes (Signed)
Diabetes Self-Management Education  Visit Type: First/Initial  Appt. Start Time: 1410 Appt. End Time: 1510  05/07/2017  [name], identified by name and date of birth, is a 83 y.o. female with a diagnosis of Diabetes: Type 2. Patient states she received diabetes education when she was first diagnosed about 10 years ago and verbalizes appropriate knowledge regarding meal planning and importance of exercise. She recently sold her home of 30 years and has moved to a smaller home in a nice community, that she is happy about. She tests her BG 1-2 times a day with results in the 130-180 mg/dl range per her report.   ASSESSMENT  There were no vitals taken for this visit. There is no height or weight on file to calculate BMI.  Diabetes Self-Management Education - 05/07/17 1406      Visit Information   Visit Type  First/Initial      Initial Visit   Diabetes Type  Type 2    Are you currently following a meal plan?  Yes    What type of meal plan do you follow?  Low carb    Are you taking your medications as prescribed?  Yes    Date Diagnosed  10 years      Health Coping   How would you rate your overall health?  Good      Psychosocial Assessment   Patient Belief/Attitude about Diabetes  Motivated to manage diabetes    Other persons present  Patient    Patient Concerns  Glycemic Control;Nutrition/Meal planning    Special Needs  None    Learning Readiness  Change in progress    How often do you need to have someone help you when you read instructions, pamphlets, or other written materials from your doctor or pharmacy?  1 - Never    What is the last grade level you completed in school?  12      Pre-Education Assessment   Patient understands the diabetes disease and treatment process.  Needs Review    Patient understands incorporating nutritional management into lifestyle.  Needs Review    Patient undertands incorporating physical activity into lifestyle.  Demonstrates understanding  / competency    Patient understands using medications safely.  Demonstrates understanding / competency    Patient understands monitoring blood glucose, interpreting and using results  Demonstrates understanding / competency    Patient understands prevention, detection, and treatment of acute complications.  Needs Review    Patient understands prevention, detection, and treatment of chronic complications.  Needs Review      Complications   Last HgB A1C per patient/outside source  7.6 %    How often do you check your blood sugar?  1-2 times/day    Fasting Blood glucose range (mg/dL)  130-179    Postprandial Blood glucose range (mg/dL)  130-179    Have you had a dilated eye exam in the past 12 months?  Yes    Have you had a dental exam in the past 12 months?  No    Are you checking your feet?  Yes    How many days per week are you checking your feet?  3      Dietary Intake   Breakfast  10 AM: english muffin with egg beaters occasionally with peppers and onions, OR eggs and sausage or breakfast ham with toast or 1/2 biscuit OR instant oatmeal    Lunch  skips most days    Snack (afternoon)  no snacking  Dinner  5 PM: [place] sandwich with cheese and sliced tomato OR snack foods    Snack (evening)  not usually unless a handful of chips with a diet soda    Beverage(s)  2% milk, unsweet tea with Splenda, diet caffeine free soda      Exercise   Exercise Type  Light (walking / raking leaves) recently had shoulder surgery and moved, so less active the past month      Patient Education   Previous Diabetes Education  Yes (please comment) when diagnosed    Nutrition management   Role of diet in the treatment of diabetes and the relationship between the three main macronutrients and blood glucose level;Reviewed blood glucose goals for pre and post meals and how to evaluate the patients' food intake on their blood glucose level.;Meal timing in regards to the patients' current diabetes medication.     Physical activity and exercise   Role of exercise on diabetes management, blood pressure control and cardiac health.    Medications  Reviewed patients medication for diabetes, action, purpose, timing of dose and side effects.    Monitoring  Identified appropriate SMBG and/or A1C goals.    Acute complications  Taught treatment of hypoglycemia - the 15 rule.    Chronic complications  Relationship between chronic complications and blood glucose control      Individualized Goals (developed by patient)   Nutrition  General guidelines for healthy choices and portions discussed    Physical Activity  Exercise 3-5 times per week    Medications  take my medication as prescribed    Monitoring   test blood glucose pre and post meals as discussed      Post-Education Assessment   Patient understands the diabetes disease and treatment process.  Demonstrates understanding / competency    Patient understands incorporating nutritional management into lifestyle.  Demonstrates understanding / competency    Patient undertands incorporating physical activity into lifestyle.  Demonstrates understanding / competency    Patient understands using medications safely.  Demonstrates understanding / competency    Patient understands monitoring blood glucose, interpreting and using results  Demonstrates understanding / competency    Patient understands prevention, detection, and treatment of acute complications.  Demonstrates understanding / competency    Patient understands prevention, detection, and treatment of chronic complications.  Demonstrates understanding / competency    Patient understands how to develop strategies to address psychosocial issues.  Demonstrates understanding / competency    Patient understands how to develop strategies to promote health/change behavior.  Demonstrates understanding / competency      Outcomes   Expected Outcomes  Demonstrated interest in learning. Expect positive outcomes    Future  DMSE  4-6 wks    Program Status  Completed       Individualized Plan for Diabetes Self-Management Training:   Learning Objective:  Patient will have a greater understanding of diabetes self-management. Patient education plan is to attend individual and/or group sessions per assessed needs and concerns.   Plan:   Patient Instructions  Plan:  Aim for 2 Carb Choices per meal (30 grams) +/- 1 either way  Aim for 0-1 Carbs per snack if hungry  Try having an evening snack to see if that helps you sleep better and if it brings your morning BG down some. Include protein in moderation with your meals and snacks Consider  increasing your activity level by walking, stationary bike or maybe walking in shallow end of the pool for 15-30 minutes as  tolerated Continue checking BG at alternate times per day as directed by MD  Continue taking medication as directed by MD  Expected Outcomes:  Demonstrated interest in learning. Expect positive outcomes  Education material provided: Meal plan card and Carbohydrate counting sheet  If problems or questions, patient to contact team via:  Phone  Future DSME appointment: 4-6 wks

## 2017-05-28 ENCOUNTER — Other Ambulatory Visit: Payer: Self-pay | Admitting: *Deleted

## 2017-05-28 ENCOUNTER — Ambulatory Visit: Payer: Medicare Other | Admitting: Neurology

## 2017-05-28 ENCOUNTER — Other Ambulatory Visit: Payer: Self-pay

## 2017-05-28 ENCOUNTER — Encounter: Payer: Self-pay | Admitting: *Deleted

## 2017-05-28 VITALS — BP 126/65 | HR 85 | Resp 20 | Ht 64.0 in | Wt 198.0 lb

## 2017-05-28 DIAGNOSIS — G4733 Obstructive sleep apnea (adult) (pediatric): Secondary | ICD-10-CM

## 2017-05-28 DIAGNOSIS — R0683 Snoring: Secondary | ICD-10-CM | POA: Insufficient documentation

## 2017-05-28 DIAGNOSIS — H47011 Ischemic optic neuropathy, right eye: Secondary | ICD-10-CM | POA: Insufficient documentation

## 2017-05-28 DIAGNOSIS — R42 Dizziness and giddiness: Secondary | ICD-10-CM | POA: Diagnosis not present

## 2017-05-28 DIAGNOSIS — I350 Nonrheumatic aortic (valve) stenosis: Secondary | ICD-10-CM

## 2017-05-28 NOTE — Progress Notes (Signed)
[facility]  [name] DOB: 05-02-1934  REFERRING DOCTOR OR PCP:  [facility] is PCP; Dr. Herbert Deaner is ophthalmologist SOURCE: patient, notes from Dr. Wilson Singer, lab reports  _________________________________   HISTORICAL  CHIEF COMPLAINT:  Chief Complaint  Patient presents with  . Snoring    Sts. seen by opthalmology, then nuro opthalmology for a cloudy vision right eye, sts. was told this is due to HTN.  Some better with medicated drops.  Here to r/o OSA and have sleep study PRN.  Sts. she falls asleep easily, no later than 10pm, wakes 4-5 hours later, is up for a couple of hours, then goes back to bed for another 3-4 hours.  Some tired during the day.  Sts. everyone tells her she snores./fim  . Decreased Visual Acuity    HISTORY OF PRESENT ILLNESS:  Update 05/28/2017: Megan [name] is a 83 year old woman who I have previously seen for poor balance here today for evaluation of daytime sleepiness and concern about obstructive sleep apnea.  She had the onset of painless right vision loss starting in May 2018 and saw her ophthalmologist, Dr. Herbert Deaner who noted optic disc edema.  She referred her to Dr. Hassell Done, Neuro-ophthalmologist at [facility], who diagnosed with nonarteritic anterior ischemic optic neuropathy.  Both he and her cardiologist are concerned about OSA.     She has had snoring and mild excessive daytime sleepiness.  She has been told that her snoring is loud and she sometimes makes some other noises with her breathing.  She typically goes to bed at 930 to 10 pm and falls asleep in a minute or two.   After 4 hours or so, she wakes up and gets out of bed x 2 hours and then goes back to sleep for a couple more hours.     EPWORTH SLEEPINESS SCALE  On a scale of 0 - 3 what is the chance of dozing:  Sitting and Reading:   1 Watching TV:    3 Sitting inactive in a public place: 0 Passenger in car for one hour: 3 Lying down to rest in  the afternoon: 0 Sitting and talking to someone: 0 Sitting quietly after lunch:  1 In a car, stopped in traffic:  0  Total (out of 24):   8/24 normal  She still has had a few more spells of reduced balance and lightheadedness.   She has aortic stenosis and hypertension.   She is on amlodipine/valsartan and Dyazide for HTN.   She also has NIDDM.     From 10/08/2016: Since the last visit, she has not had any more of the episodes of severe lightheadedness with some vertigo. An echocardiogram showed she had moderate aortic stenosis and she was seen by Dr. Johnsie Cancel of cardiology 9stufies and notes reviewed). He is going to follow her echocardiograms and a procedure may be necessary at some point.    She feels unsteady going up and down stairs and needs to hold on.   She has stress incontinence and urgency.   She denies neck pain.      She has no more falls. She did fall last year and a head CT scan 01/17/2016 did not show any evidence of stroke though she did have mild chronic microvascular ischemic change.  She denies any weakness in her legs but she has some numbness and pain and sees Pain management who has done epidural steroid injections.   She feels some of her unsteadiness is coming from  her back and knees.   She has some urinary frequency helped by medication. This is chronic.    REVIEW OF SYSTEMS: Constitutional: No fevers, chills, sweats, or change in appetite.   Mild fatigue Eyes: See above Ear, nose and throat: No hearing loss, ear pain, nasal congestion, sore throat Cardiovascular: No chest pain, palpitations Respiratory: No shortness of breath at rest or with exertion.   No wheezes GastrointestinaI: No nausea, vomiting, diarrhea, abdominal pain, fecal incontinence Genitourinary: No dysuria, urinary retention or frequency.  No nocturia. Musculoskeletal:She reports neck and shoulder pain at times.  There is some back pain.  Integumentary: No rash, pruritus, skin  lesions Neurological: as above Psychiatric: No depression at this time.  No anxiety Endocrine: No palpitations, diaphoresis, change in appetite, change in weigh or increased thirst Hematologic/Lymphatic: No anemia, purpura, petechiae. Allergic/Immunologic: No itchy/runny eyes, nasal congestion, recent allergic reactions, rashes  ALLERGIES: No Known Allergies  HOME MEDICATIONS:  Current Outpatient Medications:  .  ACCU-CHEK AVIVA PLUS test strip, 1 each by Other route 2 (two) times daily. , Disp: , Rfl:  .  ACCU-CHEK SOFTCLIX LANCETS lancets, 1 each by Other route 2 (two) times daily. , Disp: , Rfl:  .  amLODipine-valsartan (EXFORGE) 5-320 MG tablet, Take 1 tablet by mouth daily., Disp: , Rfl: 11 .  calcium-vitamin D (OSCAL WITH D) 500-200 MG-UNIT tablet, Take 1 tablet by mouth at bedtime., Disp: , Rfl:  .  clopidogrel (PLAVIX) 75 MG tablet, Take 75 mg by mouth at bedtime. , Disp: , Rfl:  .  econazole nitrate 1 % cream, Apply 1 application topically daily. For nose/corners of mouth, Disp: , Rfl:  .  gabapentin (NEURONTIN) 100 MG capsule, Take 1 capsule (100 mg total) by mouth 2 (two) times daily., Disp: 180 capsule, Rfl: 0 .  hydrocortisone 2.5 % cream, Apply 1 application topically daily. Applied to nose/corners of mouth, Disp: , Rfl:  .  ketotifen (ZADITOR) 0.025 % ophthalmic solution, Place 1 drop into both eyes 2 (two) times daily as needed (for itchy eyes.)., Disp: , Rfl:  .  Multiple Vitamin (MULTIVITAMIN WITH MINERALS) TABS tablet, Take 1 tablet by mouth at bedtime., Disp: , Rfl:  .  MYRBETRIQ 50 MG TB24 tablet, Take 50 mg by mouth daily., Disp: , Rfl: 11 .  Omega-3 Fatty Acids (FISH OIL) 1200 MG CAPS, Take 1,200 mg by mouth daily with breakfast. , Disp: , Rfl:  .  Polyvinyl Alcohol-Povidone (REFRESH OP), Place 1 drop into both eyes 3 (three) times daily as needed (dry eyes). , Disp: , Rfl:  .  ranitidine (ZANTAC) 150 MG tablet, Take 150 mg by mouth 2 (two) times daily. , Disp: ,  Rfl:  .  rosuvastatin (CRESTOR) 5 MG tablet, Take 5 mg by mouth daily., Disp: , Rfl:  .  sitaGLIPtin (JANUVIA) 100 MG tablet, Take 100 mg by mouth daily., Disp: , Rfl:  .  SYNTHROID 125 MCG tablet, Take 125 mcg by mouth daily before breakfast. , Disp: , Rfl: 12 .  triamterene-hydrochlorothiazide (DYAZIDE) 37.5-25 MG per capsule, Take 1 capsule by mouth daily as needed (swelling). , Disp: , Rfl: 11 .  acetaminophen-codeine (TYLENOL #3) 300-30 MG tablet, Take 1 tablet every 8 (eight) hours as needed by mouth for moderate pain., Disp: 30 tablet, Rfl: 0 .  gabapentin (NEURONTIN) 100 MG capsule, TAKE 1 CAPSULE BY MOUTH TWICE A DAY, Disp: 180 capsule, Rfl: 0 .  glimepiride (AMARYL) 2 MG tablet, Take 4 mg by mouth daily with breakfast. , Disp: ,  Rfl: 12  Current Facility-Administered Medications:  .  lidocaine (PF) (XYLOCAINE) 1 % injection 2 mL, 2 mL, Other, Once, Magnus Sinning, MD  PAST MEDICAL HISTORY: Past Medical History:  Diagnosis Date  . Arthritis   . Cancer ([place]) 1989   melanoma  . Diabetes mellitus without complication ([place])   . Dizziness   . Dyspnea   . GERD (gastroesophageal reflux disease)   . Heart murmur   . History of hiatal hernia   . Hypertension   . Hypothyroidism     PAST SURGICAL HISTORY: Past Surgical History:  Procedure Laterality Date  . APPENDECTOMY     83 yrs old  . CHOLECYSTECTOMY  89  . EYE SURGERY Bilateral 2017   cataract surgery  . JOINT REPLACEMENT Right    knee   . REVERSE SHOULDER ARTHROPLASTY Left 07/14/2014  . REVERSE SHOULDER ARTHROPLASTY Left 07/14/2014   Procedure: LEFT REVERSE SHOULDER ARTHROPLASTY;  Surgeon: Meredith Pel, MD;  Location: [facility];  Service: Orthopedics;  Laterality: Left;  . SHOULDER ARTHROSCOPY Left   . TOTAL SHOULDER REVISION Right 10/29/2016   Procedure: RIGHT REVERSE TOTAL SHOULDER;  Surgeon: Meredith Pel, MD;  Location: [facility];  Service: Orthopedics;  Laterality: Right;    FAMILY HISTORY: Family History   Problem Relation Age of Onset  . Hypertension Mother   . Hypertension Father   . Heart disease Father   . Heart disease Brother     SOCIAL HISTORY:  Social History   Socioeconomic History  . Marital status: Widowed    Spouse name: Not on file  . Number of children: Not on file  . Years of education: Not on file  . Highest education level: Not on file  Occupational History  . Not on file  Social Needs  . Financial resource strain: Not on file  . Food insecurity:    Worry: Not on file    Inability: Not on file  . Transportation needs:    Medical: Not on file    Non-medical: Not on file  Tobacco Use  . Smoking status: Never Smoker  . Smokeless tobacco: Never Used  Substance and Sexual Activity  . Alcohol use: No  . Drug use: No  . Sexual activity: Not on file  Lifestyle  . Physical activity:    Days per week: Not on file    Minutes per session: Not on file  . Stress: Not on file  Relationships  . Social connections:    Talks on phone: Not on file    Gets together: Not on file    Attends religious service: Not on file    Active member of club or organization: Not on file    Attends meetings of clubs or organizations: Not on file    Relationship status: Not on file  . Intimate partner violence:    Fear of current or ex partner: Not on file    Emotionally abused: Not on file    Physically abused: Not on file    Forced sexual activity: Not on file  Other Topics Concern  . Not on file  Social History Narrative  . Not on file     PHYSICAL EXAM  Vitals:   05/28/17 1443  BP: 126/65  Pulse: 85  Resp: 20  Weight: 198 lb (89.8 kg)  Height: 5\' 4"  (1.626 m)   No data found.    Body mass index is 33.99 kg/m.   General: The patient is well-developed and well-nourished and in no  acute distress.  The pharynx is Mallampati 3.  Funduscopic evaluation was difficult due to small pupils.  Cardiovascular: The heart has a regular rate and rhythm with a normal S1  and S2. She had a systolic murmur.. Lungs are clear to auscultation.  Skin: Extremities are without significant edema.  Neurologic Exam  Mental status: The patient is alert and oriented x 3 at the time of the examination. The patient has apparent normal recent and remote memory, with an apparently normal attention span and concentration ability.   Speech is normal.  Cranial nerves: Extraocular movements are full.  Visual acuity is reduced on the right relative to the left.  She had a 1+ right APD.  Facial strength and sensation is normal. Trapezius strength is normal. The tongue is midline, and the patient has symmetric elevation of the soft palate. No obvious hearing deficits are noted.  Motor:  Muscle bulk is normal.   Tone is normal. Strength is  5 / 5 in all 4 extremities.   Sensory: She has reduced vibratory sensation in the toes but normal vibratory sensation at the ankles and above.  Coordination: Cerebellar testing reveals good finger-nose-finger and fairly good heel-to-shin bilaterally.  Gait and station: Station is normal.   Gait is mildly arthritic. Tandem gait is wide. Romberg is negative.   Reflexes: Deep tendon reflexes are symmetric and normal in arms and 1 at the knees and absent at the ankles        DIAGNOSTIC DATA (LABS, IMAGING, TESTING) - I reviewed patient records, labs, notes, testing and imaging myself where available.  Lab Results  Component Value Date   WBC 8.6 10/24/2016   HGB 13.3 10/24/2016   HCT 40.5 10/24/2016   MCV 91.4 10/24/2016   PLT 311 10/24/2016      Component Value Date/Time   NA 137 10/24/2016 1052   K 3.7 10/24/2016 1052   CL 103 10/24/2016 1052   CO2 24 10/24/2016 1052   GLUCOSE 204 (H) 10/24/2016 1052   BUN 12 10/24/2016 1052   CREATININE 0.76 10/24/2016 1052   CALCIUM 9.2 10/24/2016 1052   GFRNONAA >60 10/24/2016 1052   GFRAA >60 10/24/2016 1052       ASSESSMENT AND PLAN  Anterior ischemic optic neuropathy of right eye -  Plan: Split night study  Aortic valve stenosis, etiology of cardiac valve disease unspecified  Lightheaded  Snoring - Plan: Split night study  OSA (obstructive sleep apnea) - Plan: Split night study   1.   Due to the combination of snoring, possible witnessed OSA and nonarteritic anterior ischemic optic neuropathy and CAD, we will check a sleep study and consider CPAP if she does have obstructive sleep apnea.   2.   Gait is unchanged from her last visit. 3.  She will return to see me in 3-4 months or sooner if new or worsening symptoms.  Richard A. Felecia Shelling, MD, PhD 08/01/9447, 6:75 PM Certified in Neurology, Clinical Neurophysiology, Sleep Medicine, Pain Medicine and Neuroimaging  [facility] [address], [place], [place] [address] [phone]

## 2017-06-18 ENCOUNTER — Encounter: Payer: Medicare Other | Attending: Internal Medicine | Admitting: *Deleted

## 2017-06-18 DIAGNOSIS — Z713 Dietary counseling and surveillance: Secondary | ICD-10-CM | POA: Diagnosis not present

## 2017-06-18 DIAGNOSIS — E119 Type 2 diabetes mellitus without complications: Secondary | ICD-10-CM | POA: Diagnosis not present

## 2017-06-18 NOTE — Progress Notes (Signed)
Diabetes Self-Management Education  Visit Type:  Follow-up  Appt. Start Time: 1200 Appt. End Time: 1230  06/18/2017  [name], identified by name and date of birth, is a 83 y.o. female with a diagnosis of Diabetes: Type 2.  She is very happy since she moved into a smaller place and states the neighborhood people are friendly. She continues to eat appropriately most of the time and testing her BG twice a day, stating her AM BG is typically her highest number around 140 mg/dl but later in the day it is closer to 100 mg/dl.   ASSESSMENT  Height 5' 3.5" (1.613 m), weight 198 lb (89.8 kg). Body mass index is 34.52 kg/m.   Diabetes Self-Management Education - 06/18/17 1830      Initial Visit   What type of meal plan do you follow?  carb counting      Health Coping   How would you rate your overall health?  Good      Psychosocial Assessment   Patient Belief/Attitude about Diabetes  Motivated to manage diabetes    Self-care barriers  None    Patient Concerns  Nutrition/Meal planning;Glycemic Control    Special Needs  None    Learning Readiness  Change in progress      Complications   How often do you check your blood sugar?  1-2 times/day    Fasting Blood glucose range (mg/dL)  130-179    Postprandial Blood glucose range (mg/dL)  70-129    Number of hypoglycemic episodes per month  0    Have you had a dilated eye exam in the past 12 months?  Yes    Have you had a dental exam in the past 12 months?  No dentures    Are you checking your feet?  Yes      Exercise   Exercise Type  Light (walking / raking leaves)      Patient Education   Previous Diabetes Education  Yes (please comment)      Patient Self-Evaluation of Goals - Patient rates self as meeting previously set goals (% of time)   Nutrition  >75%    Physical Activity  50 - 75 % limited due to cardiac concerns and some dizziness    Medications  >75%    Monitoring  >75%    Problem Solving  >75%    Reducing Risk   >75%    Health Coping  >75%      Outcomes   Program Status  Completed      Subsequent Visit   Since your last visit have you continued or begun to take your medications as prescribed?  Yes    Since your last visit have you experienced any weight changes?  Loss    Since your last visit, are you checking your blood glucose at least once a day?  Yes      Learning Objective:  Patient will have a greater understanding of diabetes self-management. Patient education plan is to attend individual and/or group sessions per assessed needs and concerns.  Plan:   Patient Instructions  Plan:  Aim for 2 Carb Choices per meal (30 grams) +/- 1 either way  Aim for 0-1 Carbs per snack if hungry  Try having an evening snack to see if that helps you sleep better and if it brings your morning BG down some. Include protein in moderation with your meals and snacks Continue with your activity level by walking or stationary bike for 15-30  minutes as tolerated Continue checking BG at alternate times per day as directed by MD  Continue taking medication as directed by MD  Expected Outcomes:  Demonstrated interest in learning. Expect positive outcomes  Education material provided: Food label handouts and A1C conversion sheet, Hypoglycemia handout  If problems or questions, patient to contact team via:  Phone  Future DSME appointment: - PRN

## 2017-06-18 NOTE — Patient Instructions (Signed)
Plan:  Aim for 2 Carb Choices per meal (30 grams) +/- 1 either way  Aim for 0-1 Carbs per snack if hungry  Try having an evening snack to see if that helps you sleep better and if it brings your morning BG down some. Include protein in moderation with your meals and snacks Continue with your activity level by walking or stationary bike for 15-30 minutes as tolerated Continue checking BG at alternate times per day as directed by MD  Continue taking medication as directed by MD

## 2017-06-25 ENCOUNTER — Ambulatory Visit (INDEPENDENT_AMBULATORY_CARE_PROVIDER_SITE_OTHER): Payer: Medicare Other | Admitting: Neurology

## 2017-06-25 DIAGNOSIS — R0683 Snoring: Secondary | ICD-10-CM

## 2017-06-25 DIAGNOSIS — G4733 Obstructive sleep apnea (adult) (pediatric): Secondary | ICD-10-CM

## 2017-06-25 DIAGNOSIS — H47011 Ischemic optic neuropathy, right eye: Secondary | ICD-10-CM

## 2017-06-27 NOTE — Progress Notes (Signed)
PATIENT'S NAME:  Megan [name], Megan [name] DOB:      Jul 06, 1934      MR#:    [id number]     DATE OF RECORDING: 06/25/2017 REFERRING M.D.:  Merrilee Seashore, MD Study Performed:   Baseline Polysomnogram HISTORY:  Snoring, Decreased visual acuity, GERD, and Hypertension.  The patient endorsed the Epworth Sleepiness Scale at 8/24 points.    The patient's weight 198 pounds with a height of 64 (inches), resulting in a BMI of 33.9 kg/m2.  The patient's neck circumference measured 16 inches.  CURRENT MEDICATIONS: Accu-Chek Lancets, Exforge, Oscal, Plavix, Neurontin, Zaditor, Multivitamin, Fish Oil, Refresh OP, Zantac, Crestor, Januvia, Dyazide, Tylenol, Amaryl, Xylocaine.   PROCEDURE:  This is a multichannel digital polysomnogram utilizing the Somnostar 11.2 system.  Electrodes and sensors were applied and monitored per AASM Specifications.   EEG, EOG, Chin and Limb EMG, were sampled at 200 Hz.  ECG, Snore and Nasal Pressure, Thermal Airflow, Respiratory Effort, CPAP Flow and Pressure, Oximetry was sampled at 50 Hz. Digital video and audio were recorded.      BASELINE STUDY  Lights Out was at 21:19 and Lights On at 05:19.  Total recording time (TRT) was 480 minutes, with a total sleep time (TST) of  360 minutes.   The patient's sleep latency was 22 minutes.  REM latency was 125 minutes.  The sleep efficiency was 75 %.     SLEEP ARCHITECTURE: WASO (Wake after sleep onset) was 116.5 minutes.  There were 27.5 minutes in Stage N1, 168 minutes Stage N2, 106 minutes Stage N3 and 58.5 minutes in Stage REM.  The percentage of Stage N1 was 7.6%, Stage N2 was 46.7%, Stage N3 was 29.4% and Stage R (REM sleep) was 16.3%.    RESPIRATORY ANALYSIS:  There were a total of 49 respiratory events:  29 obstructive apneas, 0 central apneas and 0 mixed apneas with a total of 29 apneas and an apnea index (AI) of 4.8 /hour. There were 20 hypopneas with a hypopnea index of 3.3 /hour. The patient also had 0 respiratory event related  arousals (RERAs).      The total APNEA/HYPOPNEA INDEX (AHI) was 8.20./hour and the total RESPIRATORY DISTURBANCE INDEX was  8.2 /hour.  1 events occurred in REM sleep and 40 events in NREM. The REM AHI was 0.1. /hour, versus a non-REM AHI of 9.6. The patient spent 180.5 minutes of total sleep time in the supine position and 180 minutes in non-supine.. The supine AHI was 14.7 versus a non-supine AHI of 1.7.  OXYGEN SATURATION:  The Wake baseline 02 saturation was 94%, with the lowest being 84%. Time spent below 89% saturation equaled 135 minutes.  PERIODIC LIMB MOVEMENTS:   The patient had a total of 0 Periodic Limb Movements.  The Periodic Limb Movement (PLM) index was 0 and the PLM Arousal index was 0/hour.  The arousals were noted as: 40 were spontaneous, 0 were associated with PLMs, 26 were associated with respiratory events.   Audio and video analysis did not show any abnormal or unusual movements, behaviors, phonations or vocalizations.    The patient took bathroom breaks.    EKG showed normal sinus rhythm (NSR).   IMPRESSION:  1. Mild Obstructive Sleep Apnea(OSA) with an AHI = 8.2 2. There was no PLMS    RECOMMENDATIONS:  1. The mild OSA without daytime sleepiness could be treated with an oral appliance and/or weight loss.    2. Follow-up with Dr. Felecia Shelling    I certify that I have  reviewed the entire raw data recording prior to the issuance of this report in accordance with the Standards of Accreditation of the [place] [employer] (AASM)   Richard A. Felecia Shelling, MD, PhD, FAAN Certified in Neurology, Clinical Neurophysiology, Sleep Medicine, Pain Medicine and Neuroimaging Director, [place] [facility] [address] [place], [place] [address] [phone]    Demographics and Medical History           Name: Megan [name], Megan [name] Age: 83 BMI: 33.9 Interp Physician: Arlice Colt, MD  DOB:  05/21/1934 Ht-IN: 64 CM: 163 Referred By: Merrilee Seashore, MD  Pt. Tag:  Wt-LB: 198 KG: 90 Tested By: Julien Girt, RPSGT  Pt. #: [id number] Sex: Female Scored By: Julien Girt, RPSGT  Bed Tag: [id number] Race: Caucasian Occupation: ---  Indication for PS: ---   Sleep Summary    Sleep Time Statistics Minutes Hours    Time in Bed 480    8.0    Total Sleep Time 360    6.0    Total Sleep Time NREM 301.5    5.0    Total Sleep Time REM 58.5    1.0    Sleep Onset 3    0.1    Wake After Sleep Onset 116.5    1.9    Wake After Sleep Period 0.5    0.0    Latency Persistent Sleep 22    0.4    Sleep Efficiency 75 Percent    Lights out 21:19     Lights on 05:19    Sleep Disruption Events Count Index    Arousals 84 14.    Awakenings 0 0    Arousals + Awakenings 84 14.    REM Awakenings 1 0.2     Sleep Stage Statistics Wake N1 N2 N3 REM    Percent Stage to SPT 24.4  5.8  35.3  22.2  12.3  Percent   Sleep Period Time in Stage 116.5 27.5 168 106 58.5 Minutes   Latency to Stage  3 7 29  125 Minutes   Percent Stage to TST  7.6 46.7 29.4 16.3 Percent   EKG Summary          EKG Statistics         Heart Rate, Wake 80 BPM  TST Epochs in HR Interval 0 < 29   Heart Rate, Steady Sleep Avg 75 BPM   0 30-59   PAC Events 0 Count   568 60-79   PVC Events 0 Count   152 80-99   Bradycardia 0 Count   0 100-119   Tachycardia 0 Count   0 120-139        0 140-159    NREM REM   0 > 160   Shortest R-R .7 .7       Longest R-R .9 .9        Respiration Summary  Event Statistics Total  With Arousal  With Awakening    Count Index  Count Index  Count Index   Apneas, Total 29 4.8  15 2.5   0 0.0    Hypopneas, Total 20 3.3  11 1.8   0 0.0    Apnea + Hypopnea Index 49 8.2   26 4.3   0 0.0    Apneas, Supine 27 9.     Apneas, Non Supine 2 .7     Hypopneas, Supine 17 5.7     Hypopneas, Non  Supine 3 1.     % Sleep Apnea 1.3 Percent     % Sleep Hypopnea .9 Percent    Oximetry Statistics       SpO2,  Mean Wake 94 Percent     SpO2, Minimum 84 Percent     SpO2, Max 95 Percent     SpO2, Mean 89 Percent            Desaturation Index, REM 0.0  Index     Desaturation Index, NREM 7.2  Index     Desaturation Index, Total 6.0  Index             SpO2 Intervals > 89% 80-89% 70-79% 60-69% 50-59% 40-49% 30-39% < 30%  360 Percent Sleep Time 44 56 0 0 0 0 0 0  Body Position Statistics   Back Side L Side R Side Prone    Total Sleep Time   180.5 179.5 179.5 0 0 Minutes   Percent Time to TST   50.1  49.9  49.9  0.0  0.0  Percent   Number of Events   44 5.0 5 0 0 Count   Number of Apneas   27 2 2  0 0 Count   Number of Hypopneas   17 3 3  0 0 Count   Apnea Index   9.0  0.7  0.7  0.0  0.0  Index   Hypopnea Index   5.7  1.0  1.0  0.0  0.0  Index   Apnea + Hypopnea Index   14.6  1.7  1.7  0.0  0.0  Index  Respiration Events    Non REM, Pre Rx Statistics Non Supine  Supine    Central Mixed Obstr  Central Mixed Obstr   Apneas 0 0 2  0 0 26 Count  Apneas, Minimum SpO2 0 0 86  0 0 84 Percent     Hypopneas 0 0 3  0 0 17 Count  Hypopneas, Minimum SpO2 0 0 86  0 0 84 Percent     Apnea + Hypopneas Index 0.0 0.0 2.1  0.0 0.0 16.2 Index    REM, Pre Rx Statistics Non Supine  Supine    Central Mixed Obstr  Central Mixed Obstr   Apneas 0 0 0  0 0 1 Count  Apneas, Minimum SpO2 0 0 0  0 0 88 Percent     Hypopneas 0 0 0  0 0 0 Count  Hypopneas, Minimum SpO2 0 0 0  0 0 0 Percent     Apnea + Hypopnea Index 0.0 0.0 0.0  0.0 0.0 2.8 Index  Leg Movement Summary    PLM Non REM (Incl. Wake) REM Total    No Arousal Arousal Wake No Arousal Arousal Wake No Arousal Arousal Wake Total   Isolated 9 18 0 1 0 0 10 18 0 28    PLMS 0 0 0 0 0 0 0 0 0 0    Total 9 18 0 1 0 0 10 18 0 28   PLM Statistics PLMS Total     Count Index Count Index    PLM 0 0 28 4.7     PLM with Arousal 0 0 18 3.0     PLM, with Wake 0 0 0 0.0     PLM, Arousal + Wake 0 0.0 18 3.0     PLM, No Arousal 0 0.0  10 1.7      PLM, Non REM 0 0.0  27 5.4  PLM, REM 0 0.0  1 1.0     Technician Comments:  PSG study performed and scored with 4% desats. Patient did not qualify for split night study due to inadequate AHI. Patient requested to use the restroom once and stayed out of bed for a while due to her legs and shoulders hurting. Patient took some Tylenol to help ease her pain. Mild OSA noted with very loud snoring. REM and supine related OSA.

## 2017-07-07 ENCOUNTER — Telehealth (INDEPENDENT_AMBULATORY_CARE_PROVIDER_SITE_OTHER): Payer: Self-pay | Admitting: Physical Medicine and Rehabilitation

## 2017-07-07 NOTE — Telephone Encounter (Signed)
Yes ok 

## 2017-07-07 NOTE — Telephone Encounter (Signed)
Scheduled for 07/24/16 at 1415 with driver.

## 2017-07-15 DIAGNOSIS — E118 Type 2 diabetes mellitus with unspecified complications: Secondary | ICD-10-CM | POA: Diagnosis not present

## 2017-07-22 DIAGNOSIS — E782 Mixed hyperlipidemia: Secondary | ICD-10-CM | POA: Diagnosis not present

## 2017-07-22 DIAGNOSIS — E118 Type 2 diabetes mellitus with unspecified complications: Secondary | ICD-10-CM | POA: Diagnosis not present

## 2017-07-22 DIAGNOSIS — I10 Essential (primary) hypertension: Secondary | ICD-10-CM | POA: Diagnosis not present

## 2017-07-22 DIAGNOSIS — H35039 Hypertensive retinopathy, unspecified eye: Secondary | ICD-10-CM | POA: Diagnosis not present

## 2017-07-22 DIAGNOSIS — E039 Hypothyroidism, unspecified: Secondary | ICD-10-CM | POA: Diagnosis not present

## 2017-07-24 ENCOUNTER — Encounter (INDEPENDENT_AMBULATORY_CARE_PROVIDER_SITE_OTHER): Payer: Self-pay | Admitting: Physical Medicine and Rehabilitation

## 2017-07-24 ENCOUNTER — Ambulatory Visit (INDEPENDENT_AMBULATORY_CARE_PROVIDER_SITE_OTHER): Payer: Self-pay

## 2017-07-24 ENCOUNTER — Ambulatory Visit (INDEPENDENT_AMBULATORY_CARE_PROVIDER_SITE_OTHER): Payer: Medicare Other | Admitting: Physical Medicine and Rehabilitation

## 2017-07-24 VITALS — BP 141/62 | HR 82

## 2017-07-24 DIAGNOSIS — M48062 Spinal stenosis, lumbar region with neurogenic claudication: Secondary | ICD-10-CM

## 2017-07-24 DIAGNOSIS — H5340 Unspecified visual field defects: Secondary | ICD-10-CM | POA: Diagnosis not present

## 2017-07-24 DIAGNOSIS — E119 Type 2 diabetes mellitus without complications: Secondary | ICD-10-CM | POA: Diagnosis not present

## 2017-07-24 DIAGNOSIS — M5416 Radiculopathy, lumbar region: Secondary | ICD-10-CM | POA: Diagnosis not present

## 2017-07-24 DIAGNOSIS — H35033 Hypertensive retinopathy, bilateral: Secondary | ICD-10-CM | POA: Diagnosis not present

## 2017-07-24 DIAGNOSIS — H469 Unspecified optic neuritis: Secondary | ICD-10-CM | POA: Diagnosis not present

## 2017-07-24 DIAGNOSIS — H35373 Puckering of macula, bilateral: Secondary | ICD-10-CM | POA: Diagnosis not present

## 2017-07-24 MED ORDER — BETAMETHASONE SOD PHOS & ACET 6 (3-3) MG/ML IJ SUSP
12.0000 mg | Freq: Once | INTRAMUSCULAR | Status: AC
  Administered 2017-07-24: 12 mg

## 2017-07-24 NOTE — Progress Notes (Signed)
 .  Numeric Pain Rating Scale and Functional Assessment Average Pain 7   In the last MONTH (on 0-10 scale) has pain interfered with the following?  1. General activity like being  able to carry out your everyday physical activities such as walking, climbing stairs, carrying groceries, or moving a chair?  Rating(4)   +Driver, -BT, -Dye Allergies.  

## 2017-07-24 NOTE — Patient Instructions (Signed)

## 2017-07-25 NOTE — Progress Notes (Signed)
Megan [name] - 83 y.o. female MRN [id number]  Date of birth: 12-21-1934  Office Visit Note: Visit Date: 07/24/2017 [facility]CP: [facility] Referred by: [facility]  Subjective: Chief Complaint  Patient presents with  . Lower Back - Pain  . Right Leg - Pain   HPI: Megan [name] is a pleasant active 83 year old female that comes in today with worsening symptoms for about 6 weeks that includes bilateral low back and hip and leg pain radicular fashion with neurogenic claudication.  This is worse with standing and walking.  She is better at rest with medication.  She reports last injection was performed in November gave her great relief up until recently.  She has had no new trauma or weakness or other red flag complaints.  She did see Dr. Louanne Skye in the interim for evaluation for  potential surgery.  Hard to tell from his exact notes but per the patient it sounded like he was contemplating multilevel lumbar fusion.  She reports that she is just not ready for something like that.  She might in fact see a neurosurgeon for a second opinion.  She did get some names from Dr. Marlou Sa she also sees.  We will go ahead today and repeat the bilateral L4 transforaminal injection for lumbar stenosis at this level.   ROS Otherwise per HPI.  Assessment & Plan: Visit Diagnoses:  1. Lumbar radiculopathy   2. Spinal stenosis of lumbar region with neurogenic claudication     Plan: No additional findings.   Meds & Orders:  Meds ordered this encounter  Medications  . betamethasone acetate-betamethasone sodium phosphate (CELESTONE) injection 12 mg    Orders Placed This Encounter  Procedures  . XR C-ARM NO REPORT  . Epidural Steroid injection    Follow-up: Return if symptoms worsen or fail to improve.   Procedures: No procedures performed  Lumbosacral Transforaminal Epidural Steroid Injection - Sub-Pedicular Approach with Fluoroscopic Guidance  [name]      Date of  Birth: 28-Jul-1934 MRN: [id number] [facility]CP: [facility]      Visit Date: 07/24/2017   Universal Protocol:    Date/Time: 07/24/2017  Consent Given By: the patient  Position: PRONE  Additional Comments: Vital signs were monitored before and after the procedure. Patient was prepped and draped in the usual sterile fashion. The correct patient, procedure, and site was verified.   Injection Procedure Details:  Procedure Site One Meds Administered:  Meds ordered this encounter  Medications  . betamethasone acetate-betamethasone sodium phosphate (CELESTONE) injection 12 mg    Laterality: Bilateral  Location/Site:  L4-L5  Needle size: 22 G  Needle type: Spinal  Needle Placement: Transforaminal  Findings:    -Comments: Excellent flow of contrast along the nerve and into the epidural space.  Procedure Details: After squaring off the end-plates to get a true AP view, the C-arm was positioned so that an oblique view of the foramen as noted above was visualized. The target area is just inferior to the "nose of the scotty dog" or sub pedicular. The soft tissues overlying this structure were infiltrated with 2-3 ml. of 1% Lidocaine without Epinephrine.  The spinal needle was inserted toward the target using a "trajectory" view along the fluoroscope beam.  Under AP and lateral visualization, the needle was advanced so it did not puncture dura and was located close the 6 O'Clock position of the pedical in AP tracterory. Biplanar projections were used to confirm position. Aspiration was confirmed to be  negative for CSF and/or blood. A 1-2 ml. volume of Isovue-250 was injected and flow of contrast was noted at each level. Radiographs were obtained for documentation purposes.   After attaining the desired flow of contrast documented above, a 0.5 to 1.0 ml test dose of 0.25% Marcaine was injected into each respective transforaminal space.  The patient was observed for 90 seconds  post injection.  After no sensory deficits were reported, and normal lower extremity motor function was noted,   the above injectate was administered so that equal amounts of the injectate were placed at each foramen (level) into the transforaminal epidural space.   Additional Comments:  The patient tolerated the procedure well Dressing: Band-Aid    Post-procedure details: Patient was observed during the procedure. Post-procedure instructions were reviewed.  Patient left the clinic in stable condition.    Clinical History: MRI LUMBAR SPINE WITHOUT CONTRAST  TECHNIQUE: Multiplanar, multisequence MR imaging of the lumbar spine was performed. No intravenous contrast was administered.  COMPARISON:  MRI lumbar spine 08/14/2004.  FINDINGS: Segmentation:  Standard.  Alignment: 0.6 cm anterolisthesis L4 on L5 due to facet arthropathy is new since the prior examination. Trace anterolisthesis L5 on S1 is unchanged.  Vertebrae:  No fracture or worrisome lesion.  Conus medullaris: Extends to the L1-2 level and appears normal.  Paraspinal and other soft tissues: Negative.  Disc levels:  T11-12 is imaged in the sagittal plane only. There is a minimal central protrusion without stenosis.  T12-L1:  Minimal right paracentral protrusion without stenosis.  L1-2:  Negative.  L2-3: Mild facet degenerative disease and a minimal disc bulge without central canal or foraminal stenosis.  L3-4: Moderate facet arthropathy, ligamentum flavum thickening and a shallow disc bulge. There is moderate central canal stenosis with narrowing in the subarticular recesses which could impact either descending L4 root. The foramina are open. Spondylosis has worsened since the prior MRI.  L4-5: Advanced facet degenerative disease and bulky ligamentum flavum thickening are seen. The disc is uncovered with a shallow bulge. There is severe central canal and bilateral subarticular recess  narrowing. Mild left foraminal narrowing is seen. The right foramen is open. Spondylosis is much worse than on the prior MRI.  L5-S1: Advanced facet degenerative disease is worse on the left. There is a shallow broad-based central protrusion and ligamentum flavum thickening. Narrowing is seen in the subarticular recesses. Spondylosis has worsened since the prior MRI.  IMPRESSION: Worsened spondylosis at L4-5 where there is severe central canal and bilateral subarticular recess narrowing due to bulky ligamentum flavum thickening and a shallow disc bulge. Advanced facet arthropathy at this level results in 0.6 cm anterolisthesis.  Worsened spondylosis at L3-4 where there is moderate central canal stenosis and narrowing in the subarticular recesses which could impact either descending L4 root.  Some progression spondylosis at L5-S1 where there is a shallow broad-based central protrusion and facet arthropathy resulting in narrowing in the subarticular recesses which could irritate either descending S1 root.   Electronically Signed   By: Inge Rise M.D.   On: 11/26/2016 12:04   She reports that she has never smoked. She has never used smokeless tobacco.  Recent Labs    07/29/16 1035 10/24/16 1052  HGBA1C 7.1* 7.2*    Objective:  VS:  HT:    WT:   BMI:     BP:(!) 141/62  HR:82bpm  TEMP: ( )  RESP:  Physical Exam  Ortho Exam Imaging: Xr C-arm No Report  Result Date: 07/24/2017 Please see  Notes tab for imaging impression.   Past Medical/Family/Surgical/Social History: Medications & Allergies reviewed per EMR, new medications updated. Patient Active Problem List   Diagnosis Date Noted  . Anterior ischemic optic neuropathy of right eye 05/28/2017  . Snoring 05/28/2017  . OSA (obstructive sleep apnea) 05/28/2017  . Shoulder arthritis 10/29/2016  . Aortic stenosis 06/21/2016  . Lightheaded 06/05/2016  . Poor balance 06/05/2016  . Systolic murmur  19/75/8832  . Sensorineural hearing loss (SNHL), bilateral 02/02/2016  . Arthritis of shoulder region, left, degenerative 07/14/2014   Past Medical History:  Diagnosis Date  . Arthritis   . Cancer ([place]) 1989   melanoma  . Diabetes mellitus without complication ([place])   . Dizziness   . Dyspnea   . GERD (gastroesophageal reflux disease)   . Heart murmur   . History of hiatal hernia   . Hypertension   . Hypothyroidism    Family History  Problem Relation Age of Onset  . Hypertension Mother   . Hypertension Father   . Heart disease Father   . Heart disease Brother    Past Surgical History:  Procedure Laterality Date  . APPENDECTOMY     84 yrs old  . CHOLECYSTECTOMY  89  . EYE SURGERY Bilateral 2017   cataract surgery  . JOINT REPLACEMENT Right    knee   . REVERSE SHOULDER ARTHROPLASTY Left 07/14/2014  . REVERSE SHOULDER ARTHROPLASTY Left 07/14/2014   Procedure: LEFT REVERSE SHOULDER ARTHROPLASTY;  Surgeon: Meredith Pel, MD;  Location: [facility];  Service: Orthopedics;  Laterality: Left;  . SHOULDER ARTHROSCOPY Left   . TOTAL SHOULDER REVISION Right 10/29/2016   Procedure: RIGHT REVERSE TOTAL SHOULDER;  Surgeon: Meredith Pel, MD;  Location: [facility];  Service: Orthopedics;  Laterality: Right;   Social History   Occupational History  . Not on file  Tobacco Use  . Smoking status: Never Smoker  . Smokeless tobacco: Never Used  Substance and Sexual Activity  . Alcohol use: No  . Drug use: No  . Sexual activity: Not on file

## 2017-07-25 NOTE — Procedures (Signed)
Lumbosacral Transforaminal Epidural Steroid Injection - Sub-Pedicular Approach with Fluoroscopic Guidance  [name]      Date of Birth: 01-18-1935 MRN: [id number] [facility]: [facility]      Visit Date: 07/24/2017   Universal Protocol:    Date/Time: 07/24/2017  Consent Given By: the patient  Position: PRONE  Additional Comments: Vital signs were monitored before and after the procedure. Patient was prepped and draped in the usual sterile fashion. The correct patient, procedure, and site was verified.   Injection Procedure Details:  Procedure Site One Meds Administered:  Meds ordered this encounter  Medications  . betamethasone acetate-betamethasone sodium phosphate (CELESTONE) injection 12 mg    Laterality: Bilateral  Location/Site:  L4-L5  Needle size: 22 G  Needle type: Spinal  Needle Placement: Transforaminal  Findings:    -Comments: Excellent flow of contrast along the nerve and into the epidural space.  Procedure Details: After squaring off the end-plates to get a true AP view, the C-arm was positioned so that an oblique view of the foramen as noted above was visualized. The target area is just inferior to the "nose of the scotty dog" or sub pedicular. The soft tissues overlying this structure were infiltrated with 2-3 ml. of 1% Lidocaine without Epinephrine.  The spinal needle was inserted toward the target using a "trajectory" view along the fluoroscope beam.  Under AP and lateral visualization, the needle was advanced so it did not puncture dura and was located close the 6 O'Clock position of the pedical in AP tracterory. Biplanar projections were used to confirm position. Aspiration was confirmed to be negative for CSF and/or blood. A 1-2 ml. volume of Isovue-250 was injected and flow of contrast was noted at each level. Radiographs were obtained for documentation purposes.   After attaining the desired flow of contrast documented  above, a 0.5 to 1.0 ml test dose of 0.25% Marcaine was injected into each respective transforaminal space.  The patient was observed for 90 seconds post injection.  After no sensory deficits were reported, and normal lower extremity motor function was noted,   the above injectate was administered so that equal amounts of the injectate were placed at each foramen (level) into the transforaminal epidural space.   Additional Comments:  The patient tolerated the procedure well Dressing: Band-Aid    Post-procedure details: Patient was observed during the procedure. Post-procedure instructions were reviewed.  Patient left the clinic in stable condition.

## 2017-07-28 ENCOUNTER — Telehealth: Payer: Self-pay | Admitting: *Deleted

## 2017-07-28 NOTE — Telephone Encounter (Signed)
-----   Message from Britt Bottom, MD sent at 07/25/2017  4:31 PM EDT ----- Please let her know the PSG only showed mild OSA.  This is unlikely to have caused her visual symptoms.  If the snoring is troublesomean oral appliance or weight loss could help

## 2017-07-28 NOTE — Telephone Encounter (Signed)
Spoke with Megan [name] and reviewed below PSG results. and the 2 tx. options (wt. loss, oral appliance.)  She verbalized understanding of same, sts. she is already working on wt. loss, so will continue with that option, call back if she needs anything else/fim

## 2017-08-14 DIAGNOSIS — L814 Other melanin hyperpigmentation: Secondary | ICD-10-CM | POA: Diagnosis not present

## 2017-08-14 DIAGNOSIS — L819 Disorder of pigmentation, unspecified: Secondary | ICD-10-CM | POA: Diagnosis not present

## 2017-08-14 DIAGNOSIS — L570 Actinic keratosis: Secondary | ICD-10-CM | POA: Diagnosis not present

## 2017-08-14 DIAGNOSIS — D229 Melanocytic nevi, unspecified: Secondary | ICD-10-CM | POA: Diagnosis not present

## 2017-08-14 DIAGNOSIS — L821 Other seborrheic keratosis: Secondary | ICD-10-CM | POA: Diagnosis not present

## 2017-09-18 ENCOUNTER — Telehealth (HOSPITAL_COMMUNITY): Payer: Self-pay | Admitting: Radiology

## 2017-09-18 NOTE — Telephone Encounter (Signed)
Patient called to verify OV with Dr. Johnsie Cancel is on the same day as echocardiogram. I informed the patient, physician's office schedule is not open this far out. I will notify nurse to that appointments need to be scheduled on same day. Will send telephone message to nurse.

## 2017-09-30 ENCOUNTER — Other Ambulatory Visit: Payer: Self-pay

## 2017-09-30 ENCOUNTER — Encounter: Payer: Self-pay | Admitting: Neurology

## 2017-09-30 ENCOUNTER — Ambulatory Visit: Payer: Medicare Other | Admitting: Neurology

## 2017-09-30 VITALS — BP 142/68 | HR 92 | Resp 22 | Ht 63.5 in | Wt 196.0 lb

## 2017-09-30 DIAGNOSIS — H47011 Ischemic optic neuropathy, right eye: Secondary | ICD-10-CM | POA: Diagnosis not present

## 2017-09-30 DIAGNOSIS — I350 Nonrheumatic aortic (valve) stenosis: Secondary | ICD-10-CM | POA: Diagnosis not present

## 2017-09-30 DIAGNOSIS — R42 Dizziness and giddiness: Secondary | ICD-10-CM

## 2017-09-30 DIAGNOSIS — G4733 Obstructive sleep apnea (adult) (pediatric): Secondary | ICD-10-CM

## 2017-09-30 NOTE — Progress Notes (Signed)
[facility]  [name] DOB: 1934-12-21  REFERRING DOCTOR OR PCP:  [facility] is PCP; Dr. Herbert Deaner is ophthalmologist SOURCE: patient, notes from Dr. Wilson Singer, lab reports  _________________________________   HISTORICAL  CHIEF COMPLAINT:  Chief Complaint  Patient presents with  . Dizziness    Sts. sense of being off balance is about the same. She uses handrails on stairs. no recent falls.  PSG showed mild OSA and she prefers to try wt. loss to treat this/fim  . Sleep Apnea    HISTORY OF PRESENT ILLNESS:   Update 09/30/2017: Megan [name] is an 83 y.o. woman with a history of right nonarteritic anterior ischemic optic neuropathy and poor balance.  Since the last visit, vision has improved and is much better, though not baseline.    We ordered a PSG and it showed mild OSA (AHI = 8.2).    She has only mild sleepiness in the afternoons.   She is trying to lose weight.   She ahs had some fluctuations with changed in DM medications.     She continues to have some spells of lightheadedness with gait ataxia.  She has only one actual fall, occurring with severe lightheadedness during the first episode.    No episode of syncope.  She does have aortic stenosis and is followed closely by cardiology.  Balance is worse right after she stands up, especially in the morning from bed.    She has some urinary frequency, helped by Myrbetriq.  No leg weakness.        Update 05/28/2017: Megan [name] is a 83 year old woman who I have previously seen for poor balance here today for evaluation of daytime sleepiness and concern about obstructive sleep apnea.  She had the onset of painless right vision loss starting in May 2018 and saw her ophthalmologist, Dr. Herbert Deaner who noted optic disc edema.  She referred her to Dr. Hassell Done, Neuro-ophthalmologist at [facility], who diagnosed with nonarteritic anterior ischemic optic neuropathy.  Both he and her cardiologist are  concerned about OSA.     She has had snoring and mild excessive daytime sleepiness.  She has been told that her snoring is loud and she sometimes makes some other noises with her breathing.  She typically goes to bed at 930 to 10 pm and falls asleep in a minute or two.   After 4 hours or so, she wakes up and gets out of bed x 2 hours and then goes back to sleep for a couple more hours.     EPWORTH SLEEPINESS SCALE  On a scale of 0 - 3 what is the chance of dozing:  Sitting and Reading:   1 Watching TV:    3 Sitting inactive in a public place: 0 Passenger in car for one hour: 3 Lying down to rest in the afternoon: 0 Sitting and talking to someone: 0 Sitting quietly after lunch:  1 In a car, stopped in traffic:  0  Total (out of 24):   8/24 normal  She still has had a few more spells of reduced balance and lightheadedness.   She has aortic stenosis and hypertension.   She is on amlodipine/valsartan and Dyazide for HTN.   She also has NIDDM.     From 10/08/2016: Since the last visit, she has not had any more of the episodes of severe lightheadedness with some vertigo. An echocardiogram showed she had moderate aortic stenosis and she was seen by Dr. Johnsie Cancel of cardiology 9stufies  and notes reviewed). He is going to follow her echocardiograms and a procedure may be necessary at some point.    She feels unsteady going up and down stairs and needs to hold on.   She has stress incontinence and urgency.   She denies neck pain.      She has no more falls. She did fall last year and a head CT scan 01/17/2016 did not show any evidence of stroke though she did have mild chronic microvascular ischemic change.  She denies any weakness in her legs but she has some numbness and pain and sees Pain management who has done epidural steroid injections.   She feels some of her unsteadiness is coming from her back and knees.   She has some urinary frequency helped by medication. This is chronic.    REVIEW OF  SYSTEMS: Constitutional: No fevers, chills, sweats, or change in appetite.   Mild fatigue Eyes: See above Ear, nose and throat: No hearing loss, ear pain, nasal congestion, sore throat Cardiovascular: No chest pain, palpitations Respiratory: No shortness of breath at rest or with exertion.   No wheezes GastrointestinaI: No nausea, vomiting, diarrhea, abdominal pain, fecal incontinence Genitourinary: No dysuria, urinary retention or frequency.  No nocturia. Musculoskeletal:She reports neck and shoulder pain at times.  There is some back pain.  Integumentary: No rash, pruritus, skin lesions Neurological: as above Psychiatric: No depression at this time.  No anxiety Endocrine: No palpitations, diaphoresis, change in appetite, change in weigh or increased thirst Hematologic/Lymphatic: No anemia, purpura, petechiae. Allergic/Immunologic: No itchy/runny eyes, nasal congestion, recent allergic reactions, rashes  ALLERGIES: No Known Allergies  HOME MEDICATIONS:  Current Outpatient Medications:  .  ACCU-CHEK AVIVA PLUS test strip, 1 each by Other route 2 (two) times daily. , Disp: , Rfl:  .  ACCU-CHEK SOFTCLIX LANCETS lancets, 1 each by Other route 2 (two) times daily. , Disp: , Rfl:  .  amLODipine-valsartan (EXFORGE) 5-320 MG tablet, Take 1 tablet by mouth daily., Disp: , Rfl: 11 .  calcium-vitamin D (OSCAL WITH D) 500-200 MG-UNIT tablet, Take 1 tablet by mouth at bedtime., Disp: , Rfl:  .  clopidogrel (PLAVIX) 75 MG tablet, Take 75 mg by mouth at bedtime. , Disp: , Rfl:  .  econazole nitrate 1 % cream, Apply 1 application topically daily. For nose/corners of mouth, Disp: , Rfl:  .  gabapentin (NEURONTIN) 100 MG capsule, Take 1 capsule (100 mg total) by mouth 2 (two) times daily., Disp: 180 capsule, Rfl: 0 .  gabapentin (NEURONTIN) 100 MG capsule, TAKE 1 CAPSULE BY MOUTH TWICE A DAY, Disp: 180 capsule, Rfl: 0 .  glimepiride (AMARYL) 2 MG tablet, Take 4 mg by mouth daily with breakfast. ,  Disp: , Rfl: 12 .  hydrocortisone 2.5 % cream, Apply 1 application topically daily. Applied to nose/corners of mouth, Disp: , Rfl:  .  ketotifen (ZADITOR) 0.025 % ophthalmic solution, Place 1 drop into both eyes 2 (two) times daily as needed (for itchy eyes.)., Disp: , Rfl:  .  Multiple Vitamin (MULTIVITAMIN WITH MINERALS) TABS tablet, Take 1 tablet by mouth at bedtime., Disp: , Rfl:  .  MYRBETRIQ 50 MG TB24 tablet, Take 50 mg by mouth daily., Disp: , Rfl: 11 .  Omega-3 Fatty Acids (FISH OIL) 1200 MG CAPS, Take 1,200 mg by mouth daily with breakfast. , Disp: , Rfl:  .  ranitidine (ZANTAC) 150 MG tablet, Take 150 mg by mouth 2 (two) times daily. , Disp: , Rfl:  .  rosuvastatin (CRESTOR) 5 MG tablet, Take 5 mg by mouth daily., Disp: , Rfl:  .  sitaGLIPtin (JANUVIA) 100 MG tablet, Take 100 mg by mouth daily., Disp: , Rfl:  .  SYNTHROID 125 MCG tablet, Take 125 mcg by mouth daily before breakfast. , Disp: , Rfl: 12 .  triamterene-hydrochlorothiazide (DYAZIDE) 37.5-25 MG per capsule, Take 1 capsule by mouth daily as needed (swelling). , Disp: , Rfl: 11  Current Facility-Administered Medications:  .  lidocaine (PF) (XYLOCAINE) 1 % injection 2 mL, 2 mL, Other, Once, Magnus Sinning, MD  PAST MEDICAL HISTORY: Past Medical History:  Diagnosis Date  . Arthritis   . Cancer ([place]) 1989   melanoma  . Diabetes mellitus without complication ([place])   . Dizziness   . Dyspnea   . GERD (gastroesophageal reflux disease)   . Heart murmur   . History of hiatal hernia   . Hypertension   . Hypothyroidism     PAST SURGICAL HISTORY: Past Surgical History:  Procedure Laterality Date  . APPENDECTOMY     83 yrs old  . CHOLECYSTECTOMY  89  . EYE SURGERY Bilateral 2017   cataract surgery  . JOINT REPLACEMENT Right    knee   . REVERSE SHOULDER ARTHROPLASTY Left 07/14/2014  . REVERSE SHOULDER ARTHROPLASTY Left 07/14/2014   Procedure: LEFT REVERSE SHOULDER ARTHROPLASTY;  Surgeon: Meredith Pel, MD;   Location: [facility];  Service: Orthopedics;  Laterality: Left;  . SHOULDER ARTHROSCOPY Left   . TOTAL SHOULDER REVISION Right 10/29/2016   Procedure: RIGHT REVERSE TOTAL SHOULDER;  Surgeon: Meredith Pel, MD;  Location: [facility];  Service: Orthopedics;  Laterality: Right;    FAMILY HISTORY: Family History  Problem Relation Age of Onset  . Hypertension Mother   . Hypertension Father   . Heart disease Father   . Heart disease Brother     SOCIAL HISTORY:  Social History   Socioeconomic History  . Marital status: Widowed    Spouse name: Not on file  . Number of children: Not on file  . Years of education: Not on file  . Highest education level: Not on file  Occupational History  . Not on file  Social Needs  . Financial resource strain: Not on file  . Food insecurity:    Worry: Not on file    Inability: Not on file  . Transportation needs:    Medical: Not on file    Non-medical: Not on file  Tobacco Use  . Smoking status: Never Smoker  . Smokeless tobacco: Never Used  Substance and Sexual Activity  . Alcohol use: No  . Drug use: No  . Sexual activity: Not on file  Lifestyle  . Physical activity:    Days per week: Not on file    Minutes per session: Not on file  . Stress: Not on file  Relationships  . Social connections:    Talks on phone: Not on file    Gets together: Not on file    Attends religious service: Not on file    Active member of club or organization: Not on file    Attends meetings of clubs or organizations: Not on file    Relationship status: Not on file  . Intimate partner violence:    Fear of current or ex partner: Not on file    Emotionally abused: Not on file    Physically abused: Not on file    Forced sexual activity: Not on file  Other Topics Concern  .  Not on file  Social History Narrative  . Not on file     PHYSICAL EXAM  Vitals:   09/30/17 1454  BP: (!) 142/68  Pulse: 92  Resp: (!) 22  Weight: 196 lb (88.9 kg)  Height: 5' 3.5"  (1.613 m)   No data found.    Body mass index is 34.18 kg/m.   General: The patient is well-developed and well-nourished and in no acute distress.  The pharynx is Mallampati 3.  Funduscopic evaluation was difficult due to small pupils.  Cardiovascular: The heart has a regular rate and rhythm   Skin: Extremities are without significant edema.  Neurologic Exam  Mental status: The patient is alert and oriented x 3 at the time of the examination. The patient has apparent normal recent and remote memory, with an apparently normal attention span and concentration ability.   Speech is normal.  Cranial nerves: Extraocular movements are full.   She had a 1+ right APD.  Facial strength and sensation is normal. Trapezius strength is normal. The tongue is midline, and the patient has symmetric elevation of the soft palate. No obvious hearing deficits are noted.  Motor:  Muscle bulk is normal.   Tone is normal. Strength is  5 / 5 in all 4 extremities.   Sensory:    She has mildly reduced vibratory sensation in the feet and normal sensation elsewhere.  Coordination: Cerebellar testing shows fairly normal finger-to-nose and heel-to-shin bilaterally.  Gait and station: Station is normal.   Gait is mildly arthritic.  Tandem gait is moderately wide but could be within normal limits for her age.. Romberg is negative.   Reflexes: Deep tendon reflexes are symmetric and normal in arms, 2 at the knees and absent at the toes 2+ at the knees and absent at the ankles.    DIAGNOSTIC DATA (LABS, IMAGING, TESTING) - I reviewed patient records, labs, notes, testing and imaging myself where available.  Lab Results  Component Value Date   WBC 8.6 10/24/2016   HGB 13.3 10/24/2016   HCT 40.5 10/24/2016   MCV 91.4 10/24/2016   PLT 311 10/24/2016      Component Value Date/Time   NA 137 10/24/2016 1052   K 3.7 10/24/2016 1052   CL 103 10/24/2016 1052   CO2 24 10/24/2016 1052   GLUCOSE 204 (H)  10/24/2016 1052   BUN 12 10/24/2016 1052   CREATININE 0.76 10/24/2016 1052   CALCIUM 9.2 10/24/2016 1052   GFRNONAA >60 10/24/2016 1052   GFRAA >60 10/24/2016 1052       ASSESSMENT AND PLAN  OSA (obstructive sleep apnea)  Lightheaded  Aortic valve stenosis, etiology of cardiac valve disease unspecified  Anterior ischemic optic neuropathy of right eye   1.   We had a discussion about her obstructive sleep apnea.  It is mild and she does not have much daytime sleepiness.  I do not think it is severe enough to have contributed to her optic neuropathy.  We discussed trying to lose weight.  If the sleepiness worsens we could always consider CPAP or an oral appliance.   2.   She will let us know if she has more severe episodes of lightheadedness or ataxia. 3.  She will return to see me in 3-4 months or sooner if new or worsening symptoms.  Odie Edmonds A. Felecia Shelling, MD, PhD 03/05/7122, 5:80 PM Certified in Neurology, Clinical Neurophysiology, Sleep Medicine, Pain Medicine and Neuroimaging  [facility] [address] [place], [place]  [address] [phone] 

## 2017-10-14 DIAGNOSIS — E782 Mixed hyperlipidemia: Secondary | ICD-10-CM | POA: Diagnosis not present

## 2017-10-14 DIAGNOSIS — I2510 Atherosclerotic heart disease of native coronary artery without angina pectoris: Secondary | ICD-10-CM | POA: Diagnosis not present

## 2017-10-14 DIAGNOSIS — E1165 Type 2 diabetes mellitus with hyperglycemia: Secondary | ICD-10-CM | POA: Diagnosis not present

## 2017-10-28 DIAGNOSIS — I10 Essential (primary) hypertension: Secondary | ICD-10-CM | POA: Diagnosis not present

## 2017-10-28 DIAGNOSIS — E1165 Type 2 diabetes mellitus with hyperglycemia: Secondary | ICD-10-CM | POA: Diagnosis not present

## 2017-10-28 DIAGNOSIS — E782 Mixed hyperlipidemia: Secondary | ICD-10-CM | POA: Diagnosis not present

## 2017-10-28 DIAGNOSIS — Z23 Encounter for immunization: Secondary | ICD-10-CM | POA: Diagnosis not present

## 2017-10-28 DIAGNOSIS — I2510 Atherosclerotic heart disease of native coronary artery without angina pectoris: Secondary | ICD-10-CM | POA: Diagnosis not present

## 2017-11-04 DIAGNOSIS — I10 Essential (primary) hypertension: Secondary | ICD-10-CM | POA: Diagnosis not present

## 2017-11-04 DIAGNOSIS — E782 Mixed hyperlipidemia: Secondary | ICD-10-CM | POA: Diagnosis not present

## 2017-11-04 DIAGNOSIS — H35039 Hypertensive retinopathy, unspecified eye: Secondary | ICD-10-CM | POA: Diagnosis not present

## 2017-11-04 DIAGNOSIS — E118 Type 2 diabetes mellitus with unspecified complications: Secondary | ICD-10-CM | POA: Diagnosis not present

## 2017-11-04 DIAGNOSIS — E039 Hypothyroidism, unspecified: Secondary | ICD-10-CM | POA: Diagnosis not present

## 2017-11-06 ENCOUNTER — Telehealth: Payer: Self-pay

## 2017-11-06 NOTE — Telephone Encounter (Signed)
Left message for patient to call back. Patient needs to schedule an echo and office visit same day in December.

## 2017-11-11 NOTE — Telephone Encounter (Signed)
Patient has appt December 27 for echo and OV same day.

## 2018-01-06 ENCOUNTER — Other Ambulatory Visit (HOSPITAL_COMMUNITY): Payer: Medicare Other

## 2018-01-18 NOTE — H&P (View-Only) (Signed)
Cardiology Office Note   Date:  01/23/2018   ID:  Megan [name], DOB 03/06/1934, MRN [id number]  PCP:  [facility]  Cardiologist:   Jenkins Rouge, MD   No chief complaint on file.     History of Present Illness: Megan [name] is a 83 y.o. female who presents for f/u  aortic stenosis . She has CRF;s DM, HTN normal myovue 07/23/16 with EF 74%  She has significant orthopedic issues followed by Dr Marlou Sa Now s/p bilateral shoulder replacements most recently on right 10/30/16 Back issues as well    Sees neurology and ENT for dizziness negative w/u MRI for stroke   TTE 06/20/16 mean gradient 31 mmhg peak 64 mmHg normal EF mild LVH mild to moderate MR TTE 01/13/17 stable mean gradient 28 mmHg peak 57 mmHg mild MR TTE 01/23/18  Worsened mean gradient 39 mmHg peak peak 67 mmHg mild MR  She has some exertional dyspnea and fatigue no chest pain Her imbalance and dizziness is chronic and likely vestibular   Long discussion with her about progressive AS and Rx options. She wants to do everything possible to stay healthy including surgery if needed. Discussed possible TAVR. Will start with right and left cath r/o CAD and order TAVR CTA will then see Dr Burt Knack or [facility]   Risks of conventional SAVR vs TAVR discussed Risks of cath including contrast reaction bleeding MI stroke and need for emergency surgery discussed Willing to proceed   Spent over 45 minutes discussing progression of AS going over echo and TAVR vs SAVR options for rx  .   Past Medical History:  Diagnosis Date  . Arthritis   . Cancer ([place]) 1989   melanoma  . Diabetes mellitus without complication ([place])   . Dizziness   . Dyspnea   . GERD (gastroesophageal reflux disease)   . Heart murmur   . History of hiatal hernia   . Hypertension   . Hypothyroidism     Past Surgical History:  Procedure Laterality Date  . APPENDECTOMY     83 yrs old  . CHOLECYSTECTOMY  95  . EYE SURGERY Bilateral 2017   cataract surgery  . JOINT REPLACEMENT Right    knee   . REVERSE SHOULDER ARTHROPLASTY Left 07/14/2014  . REVERSE SHOULDER ARTHROPLASTY Left 07/14/2014   Procedure: LEFT REVERSE SHOULDER ARTHROPLASTY;  Surgeon: Meredith Pel, MD;  Location: [facility];  Service: Orthopedics;  Laterality: Left;  . SHOULDER ARTHROSCOPY Left   . TOTAL SHOULDER REVISION Right 10/29/2016   Procedure: RIGHT REVERSE TOTAL SHOULDER;  Surgeon: Meredith Pel, MD;  Location: [facility];  Service: Orthopedics;  Laterality: Right;     Current Outpatient Medications  Medication Sig Dispense Refill  . ACCU-CHEK AVIVA PLUS test strip 1 each by Other route 2 (two) times daily.     Marland Kitchen ACCU-CHEK SOFTCLIX LANCETS lancets 1 each by Other route 2 (two) times daily.     Marland Kitchen amLODipine-valsartan (EXFORGE) 5-320 MG tablet Take 1 tablet by mouth daily.  11  . calcium-vitamin D (OSCAL WITH D) 500-200 MG-UNIT tablet Take 1 tablet by mouth at bedtime.    . clopidogrel (PLAVIX) 75 MG tablet Take 75 mg by mouth at bedtime.     Marland Kitchen econazole nitrate 1 % cream Apply 1 application topically daily. For nose/corners of mouth    . gabapentin (NEURONTIN) 100 MG capsule Take 1 capsule (100 mg total) by mouth 2 (two) times daily. 180 capsule 0  . gabapentin (  NEURONTIN) 100 MG capsule TAKE 1 CAPSULE BY MOUTH TWICE A DAY 180 capsule 0  . glimepiride (AMARYL) 2 MG tablet Take 4 mg by mouth daily with breakfast.   12  . hydrocortisone 2.5 % cream Apply 1 application topically daily. Applied to nose/corners of mouth    . ketotifen (ZADITOR) 0.025 % ophthalmic solution Place 1 drop into both eyes 2 (two) times daily as needed (for itchy eyes.).    Marland Kitchen Multiple Vitamin (MULTIVITAMIN WITH MINERALS) TABS tablet Take 1 tablet by mouth at bedtime.    Marland Kitchen MYRBETRIQ 50 MG TB24 tablet Take 50 mg by mouth daily.  11  . Omega-3 Fatty Acids (FISH OIL) 1200 MG CAPS Take 1,200 mg by mouth daily with breakfast.     . ranitidine (ZANTAC) 150 MG tablet Take 150 mg by mouth 2  (two) times daily.     . rosuvastatin (CRESTOR) 5 MG tablet Take 5 mg by mouth daily.    . sitaGLIPtin (JANUVIA) 100 MG tablet Take 100 mg by mouth daily.    Marland Kitchen SYNTHROID 125 MCG tablet Take 125 mcg by mouth daily before breakfast.   12  . triamterene-hydrochlorothiazide (DYAZIDE) 37.5-25 MG per capsule Take 1 capsule by mouth daily as needed (swelling).   11   Current Facility-Administered Medications  Medication Dose Route Frequency Provider Last Rate Last Dose  . lidocaine (PF) (XYLOCAINE) 1 % injection 2 mL  2 mL Other Once Magnus Sinning, MD        Allergies:   Patient has no known allergies.    Social History:  The patient  reports that she has never smoked. She has never used smokeless tobacco. She reports that she does not drink alcohol or use drugs.   Family History:  The patient's family history includes Heart disease in her brother and father; Hypertension in her father and mother.    ROS:  Please see the history of present illness.   Otherwise, review of systems are positive for none.   All other systems are reviewed and negative.    PHYSICAL EXAM: VS:  BP 122/74   Pulse 72   Ht 5\' 4"  (1.626 m)   Wt 193 lb 1.9 oz (87.6 kg)   LMP  (LMP Unknown)   SpO2 96%   BMI 33.15 kg/m  , BMI Body mass index is 33.15 kg/m. Affect appropriate Elderly white female  HEENT: normal Neck supple with no adenopathy JVP normal no bruits no thyromegaly Lungs clear with no wheezing and good diaphragmatic motion Heart:  S1/S2 preserved moderate AS murmur, no rub, gallop or click PMI normal Abdomen: benighn, BS positve, no tenderness, no AAA no bruit.  No HSM or HJR Distal pulses intact with no bruits No edema Neuro non-focal Skin warm and dry Post bilateral shoulder replacement    EKG:   07/18/16  NSR rate 87 RBBB     Wt Readings from Last 3 Encounters:  01/23/18 193 lb 1.9 oz (87.6 kg)  09/30/17 196 lb (88.9 kg)  06/18/17 198 lb (89.8 kg)      Other studies  Reviewed: Additional studies/ records that were reviewed today include: Notes from primary and neurology as well As CT, echo labs and ENT notes Dr Janace Hoard. .    ASSESSMENT AND PLAN:  1. Aortic Stenosis  Reviewed TTE done today worse severe with mean gradient 39 mmHg more fatigue and dyspnea Right and left cath and TAVR CTA arranged I think she will be a good candidate for TAVR  2. Dizziness non cardiac not postural in office follow Not clear that it would be related to AS   3. HTN  Well controlled.  Continue current medications and low sodium Dash type diet.    4 DM Discussed low carb diet.  Target hemoglobin A1c is 6.5 or less.  Continue current medications.  5. RBBB chronic no high grade AV block yearly ECG   6. Ortho: post bilateral shoulder replacements PT/OT f/u Dr Marlou Sa will be getting 2nd opinion about back issues   Current medicines are reviewed at length with the patient today.  The patient does not have concerns regarding medicines.  The following changes have been made:  no change  Labs/ tests ordered today include: pre cath and TAVR CTA   Orders Placed This Encounter  Procedures  . CT CORONARY MORPH W/CTA COR W/SCORE W/CA W/CM &/OR WO/CM  . CT CORONARY FRACTIONAL FLOW RESERVE DATA PREP  . CT CORONARY FRACTIONAL FLOW RESERVE FLUID ANALYSIS  . Basic metabolic panel  . CBC with Differential/Platelet  . EKG 12-Lead     Disposition:   FU post cath     Signed, Jenkins Rouge, MD  01/23/2018 10:54 AM    [facility] [address], [place], [place]  [address] Phone: [phone]; Fax: [phone]

## 2018-01-18 NOTE — Progress Notes (Signed)
Cardiology Office Note   Date:  01/23/2018   ID:  Megan [name], DOB 1934-08-20, MRN [id number]  PCP:  [facility]  Cardiologist:   Jenkins Rouge, MD   No chief complaint on file.     History of Present Illness: Megan [name] is a 83 y.o. female who presents for f/u  aortic stenosis . She has CRF;s DM, HTN normal myovue 07/23/16 with EF 74%  She has significant orthopedic issues followed by Dr Marlou Sa Now s/p bilateral shoulder replacements most recently on right 10/30/16 Back issues as well    Sees neurology and ENT for dizziness negative w/u MRI for stroke   TTE 06/20/16 mean gradient 31 mmhg peak 64 mmHg normal EF mild LVH mild to moderate MR TTE 01/13/17 stable mean gradient 28 mmHg peak 57 mmHg mild MR TTE 01/23/18  Worsened mean gradient 39 mmHg peak peak 67 mmHg mild MR  She has some exertional dyspnea and fatigue no chest pain Her imbalance and dizziness is chronic and likely vestibular   Long discussion with her about progressive AS and Rx options. She wants to do everything possible to stay healthy including surgery if needed. Discussed possible TAVR. Will start with right and left cath r/o CAD and order TAVR CTA will then see Dr Burt Knack or [facility]   Risks of conventional SAVR vs TAVR discussed Risks of cath including contrast reaction bleeding MI stroke and need for emergency surgery discussed Willing to proceed   Spent over 45 minutes discussing progression of AS going over echo and TAVR vs SAVR options for rx  .   Past Medical History:  Diagnosis Date  . Arthritis   . Cancer ([place]) 1989   melanoma  . Diabetes mellitus without complication ([place])   . Dizziness   . Dyspnea   . GERD (gastroesophageal reflux disease)   . Heart murmur   . History of hiatal hernia   . Hypertension   . Hypothyroidism     Past Surgical History:  Procedure Laterality Date  . APPENDECTOMY     83 yrs old  . CHOLECYSTECTOMY  89  . EYE SURGERY Bilateral 2017   cataract surgery  . JOINT REPLACEMENT Right    knee   . REVERSE SHOULDER ARTHROPLASTY Left 07/14/2014  . REVERSE SHOULDER ARTHROPLASTY Left 07/14/2014   Procedure: LEFT REVERSE SHOULDER ARTHROPLASTY;  Surgeon: Meredith Pel, MD;  Location: [place];  Service: Orthopedics;  Laterality: Left;  . SHOULDER ARTHROSCOPY Left   . TOTAL SHOULDER REVISION Right 10/29/2016   Procedure: RIGHT REVERSE TOTAL SHOULDER;  Surgeon: Meredith Pel, MD;  Location: [place];  Service: Orthopedics;  Laterality: Right;     Current Outpatient Medications  Medication Sig Dispense Refill  . ACCU-CHEK AVIVA PLUS test strip 1 each by Other route 2 (two) times daily.     Marland Kitchen ACCU-CHEK SOFTCLIX LANCETS lancets 1 each by Other route 2 (two) times daily.     Marland Kitchen amLODipine-valsartan (EXFORGE) 5-320 MG tablet Take 1 tablet by mouth daily.  11  . calcium-vitamin D (OSCAL WITH D) 500-200 MG-UNIT tablet Take 1 tablet by mouth at bedtime.    . clopidogrel (PLAVIX) 75 MG tablet Take 75 mg by mouth at bedtime.     Marland Kitchen econazole nitrate 1 % cream Apply 1 application topically daily. For nose/corners of mouth    . gabapentin (NEURONTIN) 100 MG capsule Take 1 capsule (100 mg total) by mouth 2 (two) times daily. 180 capsule 0  . gabapentin (  NEURONTIN) 100 MG capsule TAKE 1 CAPSULE BY MOUTH TWICE A DAY 180 capsule 0  . glimepiride (AMARYL) 2 MG tablet Take 4 mg by mouth daily with breakfast.   12  . hydrocortisone 2.5 % cream Apply 1 application topically daily. Applied to nose/corners of mouth    . ketotifen (ZADITOR) 0.025 % ophthalmic solution Place 1 drop into both eyes 2 (two) times daily as needed (for itchy eyes.).    Marland Kitchen Multiple Vitamin (MULTIVITAMIN WITH MINERALS) TABS tablet Take 1 tablet by mouth at bedtime.    Marland Kitchen MYRBETRIQ 50 MG TB24 tablet Take 50 mg by mouth daily.  11  . Omega-3 Fatty Acids (FISH OIL) 1200 MG CAPS Take 1,200 mg by mouth daily with breakfast.     . ranitidine (ZANTAC) 150 MG tablet Take 150 mg by mouth 2  (two) times daily.     . rosuvastatin (CRESTOR) 5 MG tablet Take 5 mg by mouth daily.    . sitaGLIPtin (JANUVIA) 100 MG tablet Take 100 mg by mouth daily.    Marland Kitchen SYNTHROID 125 MCG tablet Take 125 mcg by mouth daily before breakfast.   12  . triamterene-hydrochlorothiazide (DYAZIDE) 37.5-25 MG per capsule Take 1 capsule by mouth daily as needed (swelling).   11   Current Facility-Administered Medications  Medication Dose Route Frequency Provider Last Rate Last Dose  . lidocaine (PF) (XYLOCAINE) 1 % injection 2 mL  2 mL Other Once Magnus Sinning, MD        Allergies:   Patient has no known allergies.    Social History:  The patient  reports that she has never smoked. She has never used smokeless tobacco. She reports that she does not drink alcohol or use drugs.   Family History:  The patient's family history includes Heart disease in her brother and father; Hypertension in her father and mother.    ROS:  Please see the history of present illness.   Otherwise, review of systems are positive for none.   All other systems are reviewed and negative.    PHYSICAL EXAM: VS:  BP 122/74   Pulse 72   Ht 5\' 4"  (1.626 m)   Wt 193 lb 1.9 oz (87.6 kg)   LMP  (LMP Unknown)   SpO2 96%   BMI 33.15 kg/m  , BMI Body mass index is 33.15 kg/m. Affect appropriate Elderly white female  HEENT: normal Neck supple with no adenopathy JVP normal no bruits no thyromegaly Lungs clear with no wheezing and good diaphragmatic motion Heart:  S1/S2 preserved moderate AS murmur, no rub, gallop or click PMI normal Abdomen: benighn, BS positve, no tenderness, no AAA no bruit.  No HSM or HJR Distal pulses intact with no bruits No edema Neuro non-focal Skin warm and dry Post bilateral shoulder replacement    EKG:   07/18/16  NSR rate 87 RBBB     Wt Readings from Last 3 Encounters:  01/23/18 193 lb 1.9 oz (87.6 kg)  09/30/17 196 lb (88.9 kg)  06/18/17 198 lb (89.8 kg)      Other studies  Reviewed: Additional studies/ records that were reviewed today include: Notes from primary and neurology as well As CT, echo labs and ENT notes Dr Janace Hoard. .    ASSESSMENT AND PLAN:  1. Aortic Stenosis  Reviewed TTE done today worse severe with mean gradient 39 mmHg more fatigue and dyspnea Right and left cath and TAVR CTA arranged I think she will be a good candidate for TAVR  2. Dizziness non cardiac not postural in office follow Not clear that it would be related to AS   3. HTN  Well controlled.  Continue current medications and low sodium Dash type diet.    4 DM Discussed low carb diet.  Target hemoglobin A1c is 6.5 or less.  Continue current medications.  5. RBBB chronic no high grade AV block yearly ECG   6. Ortho: post bilateral shoulder replacements PT/OT f/u Dr Marlou Sa will be getting 2nd opinion about back issues   Current medicines are reviewed at length with the patient today.  The patient does not have concerns regarding medicines.  The following changes have been made:  no change  Labs/ tests ordered today include: pre cath and TAVR CTA   Orders Placed This Encounter  Procedures  . CT CORONARY MORPH W/CTA COR W/SCORE W/CA W/CM &/OR WO/CM  . CT CORONARY FRACTIONAL FLOW RESERVE DATA PREP  . CT CORONARY FRACTIONAL FLOW RESERVE FLUID ANALYSIS  . Basic metabolic panel  . CBC with Differential/Platelet  . EKG 12-Lead     Disposition:   FU post cath     Signed, Jenkins Rouge, MD  01/23/2018 10:54 AM    [facility] [place], [place], [place]  [address] Phone: [phone]; Fax: [phone]

## 2018-01-23 ENCOUNTER — Telehealth: Payer: Self-pay | Admitting: Cardiovascular Disease

## 2018-01-23 ENCOUNTER — Ambulatory Visit: Payer: Medicare Other | Admitting: Cardiovascular Disease

## 2018-01-23 ENCOUNTER — Ambulatory Visit (HOSPITAL_COMMUNITY): Payer: Medicare Other | Attending: Cardiovascular Disease

## 2018-01-23 ENCOUNTER — Encounter (INDEPENDENT_AMBULATORY_CARE_PROVIDER_SITE_OTHER): Payer: Self-pay

## 2018-01-23 ENCOUNTER — Other Ambulatory Visit: Payer: Self-pay | Admitting: Cardiovascular Disease

## 2018-01-23 ENCOUNTER — Encounter: Payer: Self-pay | Admitting: Cardiovascular Disease

## 2018-01-23 VITALS — BP 122/74 | HR 72 | Ht 64.0 in | Wt 193.1 lb

## 2018-01-23 DIAGNOSIS — I350 Nonrheumatic aortic (valve) stenosis: Secondary | ICD-10-CM

## 2018-01-23 DIAGNOSIS — I4510 Unspecified right bundle-branch block: Secondary | ICD-10-CM

## 2018-01-23 DIAGNOSIS — I10 Essential (primary) hypertension: Secondary | ICD-10-CM

## 2018-01-23 LAB — CBC WITH DIFFERENTIAL/PLATELET
Basophils Absolute: 0.1 10*3/uL (ref 0.0–0.2)
Basos: 1 %
EOS (ABSOLUTE): 0.3 10*3/uL (ref 0.0–0.4)
Eos: 3 %
Hematocrit: 38.7 % (ref 34.0–46.6)
Hemoglobin: 13.1 g/dL (ref 11.1–15.9)
Immature Grans (Abs): 0 10*3/uL (ref 0.0–0.1)
Immature Granulocytes: 0 %
LYMPHS ABS: 2.7 10*3/uL (ref 0.7–3.1)
Lymphs: 31 %
MCH: 29.6 pg (ref 26.6–33.0)
MCHC: 33.9 g/dL (ref 31.5–35.7)
MCV: 87 fL (ref 79–97)
Monocytes Absolute: 0.9 10*3/uL (ref 0.1–0.9)
Monocytes: 10 %
Neutrophils Absolute: 4.9 10*3/uL (ref 1.4–7.0)
Neutrophils: 55 %
Platelets: 333 10*3/uL (ref 150–450)
RBC: 4.43 x10E6/uL (ref 3.77–5.28)
RDW: 13 % (ref 12.3–15.4)
WBC: 8.8 10*3/uL (ref 3.4–10.8)

## 2018-01-23 LAB — BASIC METABOLIC PANEL
BUN/Creatinine Ratio: 19 (ref 12–28)
BUN: 17 mg/dL (ref 8–27)
CO2: 21 mmol/L (ref 20–29)
CREATININE: 0.89 mg/dL (ref 0.57–1.00)
Calcium: 10.3 mg/dL (ref 8.7–10.3)
Chloride: 99 mmol/L (ref 96–106)
GFR calc Af Amer: 70 mL/min/{1.73_m2} (ref >59)
GFR calc non Af Amer: 61 mL/min/{1.73_m2} (ref >59)
Glucose: 115 mg/dL — ABNORMAL HIGH (ref 65–99)
Potassium: 3.9 mmol/L (ref 3.5–5.2)
Sodium: 137 mmol/L (ref 134–144)

## 2018-01-23 NOTE — Telephone Encounter (Signed)
Left message for patient to call back  

## 2018-01-23 NOTE — Telephone Encounter (Signed)
Patient was in today to see Dr. Johnsie Cancel, she states he was going to send a script to the pharmacy for her heart, she doesn't know the name of the medication. She went the pharmacy [facility] and nothing was there.

## 2018-01-23 NOTE — Patient Instructions (Addendum)
Medication Instructions:   If you need a refill on your cardiac medications before your next appointment, please call your pharmacy.   Lab work: Your physician recommends that you have lab work today. BMET and CBC  If you have labs (blood work) drawn today and your tests are completely normal, you will receive your results only by: Marland Kitchen MyChart Message (if you have MyChart) OR . A paper copy in the mail If you have any lab test that is abnormal or we need to change your treatment, we will call you to review the results.  Testing/Procedures: Your physician has requested that you have a cardiac catheterization. Cardiac catheterization is used to diagnose and/or treat various heart conditions. Doctors may recommend this procedure for a number of different reasons. The most common reason is to evaluate chest pain. Chest pain can be a symptom of coronary artery disease (CAD), and cardiac catheterization can show whether plaque is narrowing or blocking your heart's arteries. This procedure is also used to evaluate the valves, as well as measure the blood flow and oxygen levels in different parts of your heart. For further information please visit [facility]uge[facility]iest[facility]tn. Please follow instruction sheet, as given.  Your physician has requested that you have cardiac CT for TAVR in 2 weeks.  Cardiac computed tomography (CT) is a painless test that uses an x-ray machine to take clear, detailed pictures of your heart. For further information please visit [facility]iesta[facility]tn. Please follow instruction sheet as given.  Follow-Up: At [facility], you and your health needs are our priority.  As part of our continuing mission to provide you with exceptional heart care, we have created designated Provider Care Teams.  These Care Teams include your primary Cardiologist (physician) and Advanced Practice Providers (APPs -  Physician Assistants and Nurse Practitioners) who all work together to provide you with the care you  need, when you need it. You will need a follow up appointment in 3 weeks  with Dr. Burt Knack or Dr. Angelena Form for TAVR.    [place] OFFICE [address], [place] [address] Dept: [phone] Loc: [place]  01/23/2018  You are scheduled for a Cardiac Catheterization on Thursday, January 2 with Dr. Daneen Schick.  1. Please arrive at the [facility] (Main Entrance A) at [facility]: [address] [place], [place] [address] at 5:30 AM (This time is two hours before your procedure to ensure your preparation). Free valet parking service is available.   Special note: Every effort is made to have your procedure done on time. Please understand that emergencies sometimes delay scheduled procedures.  2. Diet: Do not eat solid foods after midnight.  The patient may have clear liquids until 5am upon the day of the procedure.  3. Labs: Having done today.  4. Medication instructions in preparation for your procedure:   Contrast Allergy: No   Hold your amlodipine-valsartan and Dyazide the morning of January 2.  Hold your glimepiride and sitagliptin the morning of January 2.  On the morning of your procedure, take your Plavix/Clopidogrel and any morning medicines NOT listed above.  You may use sips of water.  5. Plan for one night stay--bring personal belongings. 6. Bring a current list of your medications and current insurance cards. 7. You MUST have a responsible person to drive you home. 8. Someone MUST be with you the first 24 hours after you arrive home or your discharge  will be delayed. 9. Please wear clothes that are easy to get on and off and wear slip-on shoes.  Thank you for allowing [place] to care for you!   -- [facility]

## 2018-01-23 NOTE — Telephone Encounter (Signed)
Not sure what that would be

## 2018-01-23 NOTE — Addendum Note (Signed)
Addended by: Aris Georgia, Mehran Guderian L on: 01/23/2018 01:48 PM   Modules accepted: Orders

## 2018-01-26 ENCOUNTER — Other Ambulatory Visit: Payer: Self-pay

## 2018-01-26 DIAGNOSIS — R42 Dizziness and giddiness: Secondary | ICD-10-CM

## 2018-01-26 DIAGNOSIS — I350 Nonrheumatic aortic (valve) stenosis: Secondary | ICD-10-CM

## 2018-01-27 NOTE — Telephone Encounter (Signed)
Follow up      Pt returning  Call from nurse

## 2018-01-27 NOTE — Telephone Encounter (Signed)
Left message for patient to call back. Dr. Johnsie Cancel signed placard form. I have left this a the front desk for her to pick up.

## 2018-01-27 NOTE — Telephone Encounter (Signed)
Patient called back. Informed her to take her plavix and ASA the morning of her procedure.

## 2018-01-27 NOTE — Telephone Encounter (Signed)
Called patient back. Informed her that she would need to take Aspirin 81 mg tablet the morning of her heart cath. Per protocol. While patient was on the phone she asked about a handicap placard. Patient stated her old one is expiring and needs a new one. Will consult Dr. Johnsie Cancel about placard.

## 2018-01-29 ENCOUNTER — Ambulatory Visit (HOSPITAL_COMMUNITY)
Admission: RE | Admit: 2018-01-29 | Discharge: 2018-01-29 | Disposition: A | Discharge status: Home / Self Care | Payer: Medicare Other | Attending: Interventional Cardiology | Admitting: Interventional Cardiology

## 2018-01-29 ENCOUNTER — Other Ambulatory Visit: Payer: Self-pay

## 2018-01-29 ENCOUNTER — Encounter (HOSPITAL_COMMUNITY): Payer: Self-pay | Admitting: Interventional Cardiology

## 2018-01-29 ENCOUNTER — Encounter (HOSPITAL_COMMUNITY)
Admission: RE | Disposition: A | Discharge status: Home / Self Care | Payer: Self-pay | Source: Home / Self Care | Attending: Interventional Cardiology

## 2018-01-29 DIAGNOSIS — E1122 Type 2 diabetes mellitus with diabetic chronic kidney disease: Secondary | ICD-10-CM | POA: Diagnosis not present

## 2018-01-29 DIAGNOSIS — I129 Hypertensive chronic kidney disease with stage 1 through stage 4 chronic kidney disease, or unspecified chronic kidney disease: Secondary | ICD-10-CM | POA: Diagnosis not present

## 2018-01-29 DIAGNOSIS — E039 Hypothyroidism, unspecified: Secondary | ICD-10-CM | POA: Diagnosis not present

## 2018-01-29 DIAGNOSIS — Z8249 Family history of ischemic heart disease and other diseases of the circulatory system: Secondary | ICD-10-CM | POA: Diagnosis not present

## 2018-01-29 DIAGNOSIS — N189 Chronic kidney disease, unspecified: Secondary | ICD-10-CM | POA: Diagnosis not present

## 2018-01-29 DIAGNOSIS — I350 Nonrheumatic aortic (valve) stenosis: Secondary | ICD-10-CM | POA: Diagnosis not present

## 2018-01-29 DIAGNOSIS — K219 Gastro-esophageal reflux disease without esophagitis: Secondary | ICD-10-CM | POA: Insufficient documentation

## 2018-01-29 DIAGNOSIS — R0609 Other forms of dyspnea: Secondary | ICD-10-CM | POA: Diagnosis not present

## 2018-01-29 DIAGNOSIS — R5383 Other fatigue: Secondary | ICD-10-CM | POA: Diagnosis not present

## 2018-01-29 DIAGNOSIS — M1990 Unspecified osteoarthritis, unspecified site: Secondary | ICD-10-CM | POA: Insufficient documentation

## 2018-01-29 DIAGNOSIS — I4510 Unspecified right bundle-branch block: Secondary | ICD-10-CM | POA: Insufficient documentation

## 2018-01-29 HISTORY — PX: RIGHT/LEFT HEART CATH AND CORONARY ANGIOGRAPHY: CATH118266

## 2018-01-29 LAB — POCT I-STAT 3, ART BLOOD GAS (G3+)
Acid-base deficit: 2 mmol/L (ref 0.0–2.0)
Bicarbonate: 23.1 mmol/L (ref 20.0–28.0)
O2 Saturation: 93 %
TCO2: 24 mmol/L (ref 22–32)
pCO2 arterial: 38.9 mmHg (ref 32.0–48.0)
pH, Arterial: 7.381 (ref 7.350–7.450)
pO2, Arterial: 69 mmHg — ABNORMAL LOW (ref 83.0–108.0)

## 2018-01-29 LAB — POCT I-STAT 3, VENOUS BLOOD GAS (G3P V)
Acid-base deficit: 1 mmol/L (ref 0.0–2.0)
Bicarbonate: 24.6 mmol/L (ref 20.0–28.0)
O2 Saturation: 71 %
PH VEN: 7.362 (ref 7.250–7.430)
TCO2: 26 mmol/L (ref 22–32)
pCO2, Ven: 43.4 mmHg — ABNORMAL LOW (ref 44.0–60.0)
pO2, Ven: 39 mmHg (ref 32.0–45.0)

## 2018-01-29 LAB — GLUCOSE, CAPILLARY
Glucose-Capillary: 141 mg/dL — ABNORMAL HIGH (ref 70–99)
Glucose-Capillary: 136 mg/dL — ABNORMAL HIGH (ref 70–99)

## 2018-01-29 SURGERY — RIGHT/LEFT HEART CATH AND CORONARY ANGIOGRAPHY
Anesthesia: LOCAL

## 2018-01-29 MED ORDER — HEPARIN SODIUM (PORCINE) 1000 UNIT/ML IJ SOLN
INTRAMUSCULAR | Status: DC | PRN
  Administered 2018-01-29: 4500 [IU] via INTRAVENOUS

## 2018-01-29 MED ORDER — MIDAZOLAM HCL 2 MG/2ML IJ SOLN
INTRAMUSCULAR | Status: DC | PRN
  Administered 2018-01-29: 0.5 mg via INTRAVENOUS

## 2018-01-29 MED ORDER — SODIUM CHLORIDE 0.9 % WEIGHT BASED INFUSION: 1.0000 mL/kg/h | INTRAVENOUS | Status: DC

## 2018-01-29 MED ORDER — HEPARIN (PORCINE) IN NACL 1000-0.9 UT/500ML-% IV SOLN
INTRAVENOUS | Status: DC | PRN
  Administered 2018-01-29: 500 mL

## 2018-01-29 MED ORDER — LIDOCAINE HCL (PF) 1 % IJ SOLN
INTRAMUSCULAR | Status: DC | PRN
  Administered 2018-01-29: 2 mL
  Administered 2018-01-29: 5 mL

## 2018-01-29 MED ORDER — SODIUM CHLORIDE 0.9% FLUSH: 3.0000 mL | INTRAVENOUS | Status: DC | PRN

## 2018-01-29 MED ORDER — MIDAZOLAM HCL 2 MG/2ML IJ SOLN
INTRAMUSCULAR | Status: AC
  Filled 2018-01-29: qty 2

## 2018-01-29 MED ORDER — ONDANSETRON HCL 4 MG/2ML IJ SOLN: 4.0000 mg | Freq: Four times a day (QID) | INTRAMUSCULAR | Status: DC | PRN

## 2018-01-29 MED ORDER — SODIUM CHLORIDE 0.9 % IV SOLN: 250.0000 mL | INTRAVENOUS | Status: DC | PRN

## 2018-01-29 MED ORDER — ACETAMINOPHEN 325 MG PO TABS: 650.0000 mg | ORAL_TABLET | ORAL | Status: DC | PRN

## 2018-01-29 MED ORDER — HEPARIN SODIUM (PORCINE) 1000 UNIT/ML IJ SOLN
INTRAMUSCULAR | Status: AC
  Filled 2018-01-29: qty 1

## 2018-01-29 MED ORDER — FENTANYL CITRATE (PF) 100 MCG/2ML IJ SOLN
INTRAMUSCULAR | Status: AC
  Filled 2018-01-29: qty 2

## 2018-01-29 MED ORDER — FENTANYL CITRATE (PF) 100 MCG/2ML IJ SOLN
INTRAMUSCULAR | Status: DC | PRN
  Administered 2018-01-29: 25 ug via INTRAVENOUS

## 2018-01-29 MED ORDER — SODIUM CHLORIDE 0.9 % WEIGHT BASED INFUSION
3.0000 mL/kg/h | INTRAVENOUS | Status: DC
  Administered 2018-01-29: 3 mL/kg/h via INTRAVENOUS

## 2018-01-29 MED ORDER — SODIUM CHLORIDE 0.9 % IV SOLN: INTRAVENOUS | Status: DC

## 2018-01-29 MED ORDER — METOPROLOL TARTRATE 50 MG PO TABS: ORAL_TABLET | ORAL | 0 refills | Status: DC

## 2018-01-29 MED ORDER — LIDOCAINE HCL (PF) 1 % IJ SOLN
INTRAMUSCULAR | Status: AC
  Filled 2018-01-29: qty 30

## 2018-01-29 MED ORDER — IOHEXOL 350 MG/ML SOLN
INTRAVENOUS | Status: DC | PRN
  Administered 2018-01-29: 70 mL via INTRA_ARTERIAL

## 2018-01-29 MED ORDER — VERAPAMIL HCL 2.5 MG/ML IV SOLN
INTRAVENOUS | Status: DC | PRN
  Administered 2018-01-29: 10 mL via INTRA_ARTERIAL

## 2018-01-29 MED ORDER — ASPIRIN 81 MG PO CHEW: 81.0000 mg | CHEWABLE_TABLET | ORAL | Status: DC

## 2018-01-29 MED ORDER — HEPARIN (PORCINE) IN NACL 1000-0.9 UT/500ML-% IV SOLN
INTRAVENOUS | Status: AC
  Filled 2018-01-29: qty 1000

## 2018-01-29 MED ORDER — SODIUM CHLORIDE 0.9% FLUSH: 3.0000 mL | Freq: Two times a day (BID) | INTRAVENOUS | Status: DC

## 2018-01-29 MED ORDER — VERAPAMIL HCL 2.5 MG/ML IV SOLN
INTRAVENOUS | Status: AC
  Filled 2018-01-29: qty 2

## 2018-01-29 SURGICAL SUPPLY — 14 items

## 2018-01-29 NOTE — Research (Signed)
[place] Informed Consent   Subject Name: Megan [name]  Subject met inclusion and exclusion criteria.  The informed consent form, study requirements and expectations were reviewed with the subject and questions and concerns were addressed prior to the signing of the consent form.  The subject verbalized understanding of the trail requirements.  The subject agreed to participate in the [facility] trial and signed the informed consent.  The informed consent was obtained prior to performance of any protocol-specific procedures for the subject.  A copy of the signed informed consent was given to the subject and a copy was placed in the subject's medical record.  Hedrick,Tammy W 01/29/2018, [id number]

## 2018-01-29 NOTE — Interval H&P Note (Signed)
Cath Lab Visit (complete for each Cath Lab visit)  Clinical Evaluation Leading to the Procedure:   ACS: No.  Non-ACS:    Anginal Classification: CCS III  Anti-ischemic medical therapy: Minimal Therapy (1 class of medications)  Non-Invasive Test Results: No non-invasive testing performed  Prior CABG: No previous CABG      History and Physical Interval Note:  01/29/2018 8:30 AM  Megan [name]  has presented today for surgery, with the diagnosis of Aortic St  The various methods of treatment have been discussed with the patient and family. After consideration of risks, benefits and other options for treatment, the patient has consented to  Procedure(s): RIGHT/LEFT HEART CATH AND CORONARY ANGIOGRAPHY (N/A) as a surgical intervention .  The patient's history has been reviewed, patient examined, no change in status, stable for surgery.  I have reviewed the patient's chart and labs.  Questions were answered to the patient's satisfaction.     Belva Crome III

## 2018-01-29 NOTE — CV Procedure (Signed)
   Left and right heart cath with coronary angiography performed from the right arm using previously placed antecubital vein Angiocath and right radial access using real-time vascular ultrasound.  Normal coronary arteries.  Right dominant anatomy.  Significant aortic stenosis with peak to peak gradient greater than 50 mmHg.  Plan referral to structural heart team.

## 2018-01-29 NOTE — Discharge Instructions (Signed)
Drink plenty of fluids. Keep right arm at or above heart level for 24 hours. Radial Site Care  This sheet gives you information about how to care for yourself after your procedure. Your health care provider may also give you more specific instructions. If you have problems or questions, contact your health care provider. What can I expect after the procedure? After the procedure, it is common to have:  Bruising and tenderness at the catheter insertion area. Follow these instructions at home: Medicines  Take over-the-counter and prescription medicines only as told by your health care provider. Insertion site care  Follow instructions from your health care provider about how to take care of your insertion site. Make sure you: ? Wash your hands with soap and water before you change your bandage (dressing). If soap and water are not available, use hand sanitizer. ? Change your dressing as told by your health care provider. ? Leave stitches (sutures), skin glue, or adhesive strips in place. These skin closures may need to stay in place for 2 weeks or longer. If adhesive strip edges start to loosen and curl up, you may trim the loose edges. Do not remove adhesive strips completely unless your health care provider tells you to do that.  Check your insertion site every day for signs of infection. Check for: ? Redness, swelling, or pain. ? Fluid or blood. ? Pus or a bad smell. ? Warmth.  Do not take baths, swim, or use a hot tub until your health care provider approves.  You may shower 24-48 hours after the procedure, or as directed by your health care provider. ? Remove the dressing and gently wash the site with plain soap and water. ? Pat the area dry with a clean towel. ? Do not rub the site. That could cause bleeding.  Do not apply powder or lotion to the site. Activity   For 24 hours after the procedure, or as directed by your health care provider: ? Do not flex or bend the affected  arm. ? Do not push or pull heavy objects with the affected arm. ? Do not drive yourself home from the hospital or clinic. You may drive 24 hours after the procedure unless your health care provider tells you not to. ? Do not operate machinery or power tools.  Do not lift anything that is heavier than 10 lb (4.5 kg), or the limit that you are told, until your health care provider says that it is safe.  Ask your health care provider when it is okay to: ? Return to work or school. ? Resume usual physical activities or sports. ? Resume sexual activity. General instructions  If the catheter site starts to bleed, raise your arm and put firm pressure on the site. If the bleeding does not stop, get help right away. This is a medical emergency.  If you went home on the same day as your procedure, a responsible adult should be with you for the first 24 hours after you arrive home.  Keep all follow-up visits as told by your health care provider. This is important. Contact a health care provider if:  You have a fever.  You have redness, swelling, or yellow drainage around your insertion site. Get help right away if:  You have unusual pain at the radial site.  The catheter insertion area swells very fast.  The insertion area is bleeding, and the bleeding does not stop when you hold steady pressure on the area.  Your arm  or hand becomes pale, cool, tingly, or numb. These symptoms may represent a serious problem that is an emergency. Do not wait to see if the symptoms will go away. Get medical help right away. Call your local emergency services (911 in the [place] Do not drive yourself to the hospital. Summary  After the procedure, it is common to have bruising and tenderness at the site.  Follow instructions from your health care provider about how to take care of your radial site wound. Check the wound every day for signs of infection.  Do not lift anything that is heavier than 10 lb (4.5  kg), or the limit that you are told, until your health care provider says that it is safe. This information is not intended to replace advice given to you by your health care provider. Make sure you discuss any questions you have with your health care provider. Document Released: 02/16/2010 Document Revised: 02/19/2017 Document Reviewed: 02/19/2017 Elsevier Interactive Patient Education  2019 [employer].

## 2018-01-30 ENCOUNTER — Other Ambulatory Visit (INDEPENDENT_AMBULATORY_CARE_PROVIDER_SITE_OTHER): Payer: Self-pay | Admitting: Physical Medicine and Rehabilitation

## 2018-01-30 NOTE — Telephone Encounter (Signed)
Please advise 

## 2018-02-17 ENCOUNTER — Ambulatory Visit (HOSPITAL_COMMUNITY): Admission: RE | Admit: 2018-02-17 | Payer: Medicare Other | Source: Ambulatory Visit

## 2018-02-17 ENCOUNTER — Other Ambulatory Visit: Payer: Self-pay | Admitting: Physician Assistant

## 2018-02-17 ENCOUNTER — Encounter: Payer: Self-pay | Admitting: Physician Assistant

## 2018-02-17 ENCOUNTER — Ambulatory Visit (HOSPITAL_COMMUNITY): Payer: Medicare Other

## 2018-02-17 ENCOUNTER — Ambulatory Visit (HOSPITAL_BASED_OUTPATIENT_CLINIC_OR_DEPARTMENT_OTHER)
Admission: RE | Admit: 2018-02-17 | Discharge: 2018-02-17 | Disposition: A | Discharge status: Home / Self Care | Payer: Medicare Other | Source: Ambulatory Visit | Attending: Cardiovascular Disease | Admitting: Cardiovascular Disease

## 2018-02-17 ENCOUNTER — Ambulatory Visit (HOSPITAL_COMMUNITY)
Admission: RE | Admit: 2018-02-17 | Discharge: 2018-02-17 | Disposition: A | Discharge status: Home / Self Care | Payer: Medicare Other | Source: Ambulatory Visit | Attending: Cardiovascular Disease | Admitting: Cardiovascular Disease

## 2018-02-17 DIAGNOSIS — I350 Nonrheumatic aortic (valve) stenosis: Secondary | ICD-10-CM | POA: Diagnosis not present

## 2018-02-17 DIAGNOSIS — R42 Dizziness and giddiness: Secondary | ICD-10-CM | POA: Insufficient documentation

## 2018-02-17 LAB — PULMONARY FUNCTION TEST
DL/VA % pred: 77 %
DL/VA: 3.7 ml/min/mmHg/L
DLCO unc % pred: 59 %
DLCO unc: 14.33 ml/min/mmHg
FEF 25-75 Pre: 2.78 L/sec
FEF2575-%Pred-Pre: 205 %
FEV1-%Pred-Pre: 124 %
FEV1-Pre: 2.37 L
FEV1FVC-%PRED-PRE: 113 %
FEV6-%Pred-Pre: 116 %
FEV6-Pre: 2.83 L
FEV6FVC-%Pred-Pre: 105 %
FVC-%Pred-Pre: 110 %
FVC-Pre: 2.83 L
Pre FEV1/FVC ratio: 84 %
Pre FEV6/FVC Ratio: 100 %
RV % pred: 105 %
RV: 2.54 L
TLC % pred: 104 %
TLC: 5.3 L

## 2018-02-17 MED ORDER — METOPROLOL TARTRATE 50 MG PO TABS: ORAL_TABLET | ORAL | 0 refills | Status: DC

## 2018-02-17 MED ORDER — ALBUTEROL SULFATE (2.5 MG/3ML) 0.083% IN NEBU: 2.5000 mg | INHALATION_SOLUTION | Freq: Once | RESPIRATORY_TRACT | Status: DC

## 2018-02-18 ENCOUNTER — Encounter: Payer: Self-pay | Admitting: Cardiovascular Disease

## 2018-02-18 ENCOUNTER — Other Ambulatory Visit: Payer: Self-pay

## 2018-02-18 ENCOUNTER — Ambulatory Visit: Payer: Medicare Other | Admitting: Cardiovascular Disease

## 2018-02-18 ENCOUNTER — Telehealth: Payer: Self-pay

## 2018-02-18 ENCOUNTER — Ambulatory Visit: Payer: Medicare Other | Attending: Cardiovascular Disease | Admitting: Physical Therapy

## 2018-02-18 ENCOUNTER — Encounter: Payer: Self-pay | Admitting: Physical Therapy

## 2018-02-18 VITALS — BP 136/70 | HR 79 | Ht 64.0 in | Wt 194.4 lb

## 2018-02-18 DIAGNOSIS — H47291 Other optic atrophy, right eye: Secondary | ICD-10-CM | POA: Diagnosis not present

## 2018-02-18 DIAGNOSIS — H469 Unspecified optic neuritis: Secondary | ICD-10-CM | POA: Diagnosis not present

## 2018-02-18 DIAGNOSIS — R942 Abnormal results of pulmonary function studies: Secondary | ICD-10-CM

## 2018-02-18 DIAGNOSIS — I350 Nonrheumatic aortic (valve) stenosis: Secondary | ICD-10-CM

## 2018-02-18 DIAGNOSIS — H35033 Hypertensive retinopathy, bilateral: Secondary | ICD-10-CM | POA: Diagnosis not present

## 2018-02-18 DIAGNOSIS — H21561 Pupillary abnormality, right eye: Secondary | ICD-10-CM | POA: Diagnosis not present

## 2018-02-18 DIAGNOSIS — R2689 Other abnormalities of gait and mobility: Secondary | ICD-10-CM | POA: Insufficient documentation

## 2018-02-18 DIAGNOSIS — H35373 Puckering of macula, bilateral: Secondary | ICD-10-CM | POA: Diagnosis not present

## 2018-02-18 NOTE — Patient Instructions (Addendum)
TAVR APPOINTMENTS:  Pre procedure instructions for CT scan on 02/23/2018. Please check in between 7:00-7:15AM in Radiology (first floor). Your CT scans (of chest/abdomen/pelvis/heart) will begin at 7:45AM.   Please follow these instructions carefully (unless otherwise directed):  On the Day/Night Before the Test:  Drink plenty of water.  Do not consume any caffeinated/decaffeinated beverages or chocolate 12 hours prior to your test.  Do not take any antihistamines 12 hours prior to your test.  On the Day of the Test:  Drink plenty of water. Do not drink any water within one hour of the test. (6:45AM)  Do not eat any food 4 hours prior to the test. (Nothing after 3:45AM)  You may take your regular medications prior to the test.  Take 50 mg of lopressor (metoprolol tartrate) one hour (6:45AM) before the test with a few sips of water. (prescription will be sent to pharmacy)  After the Test:  Drink plenty of water.  After receiving IV contrast, you may experience a mild flushed feeling. This is normal.  On occasion, you may experience a mild rash up to 24 hours after the test. This is not dangerous. If this occurs, you can take Benadryl 25 mg and increase your fluid intake.  If you experience trouble breathing, this can be serious. If it is severe call 911 IMMEDIATELY. If it is mild, please call our office.  Follow-Up: Your physician has requested Outpatient Physical Therapy evaluation.  This will be done at [facility] at [address] [place] [address].   This is scheduled on Wednesday, February 18, 2018 at 1:30 PM  You have been referred to Dr Cyndia Bent at [facility] for further TAVR evaluation.  [place], [place], [place], [place] [address], [phone]  You are scheduled for first surgical evaluation with Dr Cyndia Bent on Thursday, February 26, 2018 at 4:00 PM.   Please call the office on the day of your  appointment to make sure the surgeon will be there and on time as many times there are delays or rescheduling due to emergency surgery.   If you have any questions or concerns, please do not hesitate to call.

## 2018-02-18 NOTE — Telephone Encounter (Signed)
-----   Message from Josue Hector, MD sent at 02/17/2018 11:07 AM EST ----- Have her see pulmonary no good explanation for her DLCO being so bad and may need 6 minute walk test to see if she desats

## 2018-02-18 NOTE — Progress Notes (Signed)
Cardiology Office Note:    Date:  02/22/2018   ID:  Megan [name], DOB [birth year], MRN [id number]  PCP:  Merrilee Seashore, MD  Cardiologist:  Jenkins Rouge, MD  Electrophysiologist:  None   Referring MD: [facility] *   Chief Complaint  Patient presents with  . Shortness of Breath    History of Present Illness:    Megan [name] is a 84 y.o. female referred for evaluation of severe aortic stenosis by Dr. Johnsie Cancel.   The patient has been followed for aortic stenosis with serial clinical exams and echo studies.  Over the course of the past 12 months her aortic stenosis progressed with mean gradient increasing from 28 to 39 mmHg and peak gradient increasing from 57 to 67 mmHg.  The patient is here alone today. She was initially diagnosed with aortic stenosis by Dr Johnsie Cancel in 2018. She complains of fatigue and exercise intolerance over the past year, slowly progressive. She also complains of dizziness but describes it as more of a 'balance problem.' She admits to mild shortness of breath. Also has intermittent pain in the chest or epigastrium. This is not related to physical exertion.   The patient has osteoarthritis and has undergone bilateral shoulder replacement and knee replacement. She remains functionally independent and still drives a car. She has been widowed for 5 years. She has 2 sons who live locally. She has otherwise been well and has not been hospitalized with the exception of her orthopedic surgeries.   Past Medical History:  Diagnosis Date  . Arthritis   . Cancer ([place]) 1989   melanoma  . Diabetes mellitus without complication ([place])   . Dizziness   . GERD (gastroesophageal reflux disease)   . History of hiatal hernia   . Hypertension   . Hypothyroidism   . Severe aortic stenosis     Past Surgical History:  Procedure Laterality Date  . APPENDECTOMY     84 yrs old  . CHOLECYSTECTOMY  95  . EYE SURGERY Bilateral 2017   cataract surgery  . JOINT  REPLACEMENT Right    knee   . REVERSE SHOULDER ARTHROPLASTY Left 07/14/2014  . REVERSE SHOULDER ARTHROPLASTY Left 07/14/2014   Procedure: LEFT REVERSE SHOULDER ARTHROPLASTY;  Surgeon: Meredith Pel, MD;  Location: [place];  Service: Orthopedics;  Laterality: Left;  . RIGHT/LEFT HEART CATH AND CORONARY ANGIOGRAPHY N/A 01/29/2018   Procedure: RIGHT/LEFT HEART CATH AND CORONARY ANGIOGRAPHY;  Surgeon: Belva Crome, MD;  Location: [place];  Service: Cardiovascular;  Laterality: N/A;  . SHOULDER ARTHROSCOPY Left   . TOTAL SHOULDER REVISION Right 10/29/2016   Procedure: RIGHT REVERSE TOTAL SHOULDER;  Surgeon: Meredith Pel, MD;  Location: [place] [facility];  Service: Orthopedics;  Laterality: Right;    Current Medications: Current Meds  Medication Sig  . ACCU-CHEK AVIVA PLUS test strip 1 each by Other route 2 (two) times daily.   Marland Kitchen ACCU-CHEK SOFTCLIX LANCETS lancets 1 each by Other route 2 (two) times daily.   Marland Kitchen acetaminophen (TYLENOL) 500 MG tablet Take 500-1,000 mg by mouth every 6 (six) hours as needed (pain.).  Marland Kitchen amLODipine-valsartan (EXFORGE) 5-320 MG tablet Take 1 tablet by mouth daily.  . calcium-vitamin D (OSCAL WITH D) 500-200 MG-UNIT tablet Take 1 tablet by mouth daily at 12 noon.   . clopidogrel (PLAVIX) 75 MG tablet Take 75 mg by mouth at bedtime.   Marland Kitchen econazole nitrate 1 % cream Apply 1 application topically daily. For nose/corners of mouth  .  gabapentin (NEURONTIN) 100 MG capsule Take 1 capsule (100 mg total) by mouth at bedtime.  Marland Kitchen glimepiride (AMARYL) 2 MG tablet Take 2-4 mg by mouth See admin instructions. Take 1 tablet (2 mg) by mouth in the morning & take 2 tablets (4 mg) by mouth at night.  . hydrocortisone 2.5 % cream Apply 1 application topically daily. Applied to nose/corners of mouth  . ketotifen (ZADITOR) 0.025 % ophthalmic solution Place 1 drop into both eyes 2 (two) times daily as needed (for itchy eyes.).  Marland Kitchen metoprolol tartrate (LOPRESSOR) 50 MG tablet Take one  hour prior to TAVR CT scan  . Multiple Vitamin (MULTIVITAMIN WITH MINERALS) TABS tablet Take 1 tablet by mouth daily at 12 noon.   Marland Kitchen MYRBETRIQ 50 MG TB24 tablet Take 50 mg by mouth daily.  . Omega-3 Fatty Acids (FISH OIL) 1200 MG CAPS Take 1,200 mg by mouth daily at 12 noon.   . rosuvastatin (CRESTOR) 5 MG tablet Take 5 mg by mouth daily.  . sitaGLIPtin (JANUVIA) 100 MG tablet Take 100 mg by mouth daily.  Marland Kitchen SYNTHROID 125 MCG tablet Take 125 mcg by mouth daily before breakfast.   . triamterene-hydrochlorothiazide (DYAZIDE) 37.5-25 MG per capsule Take 1 capsule by mouth at bedtime.    Current Facility-Administered Medications for the 02/18/18 encounter (Office Visit) with Sherren Mocha, MD  Medication  . lidocaine (PF) (XYLOCAINE) 1 % injection 2 mL     Allergies:   Patient has no known allergies.   Social History   Socioeconomic History  . Marital status: Widowed    Spouse name: Not on file  . Number of children: Not on file  . Years of education: Not on file  . Highest education level: Not on file  Occupational History  . Not on file  Social Needs  . Financial resource strain: Not on file  . Food insecurity:    Worry: Not on file    Inability: Not on file  . Transportation needs:    Medical: Not on file    Non-medical: Not on file  Tobacco Use  . Smoking status: Never Smoker  . Smokeless tobacco: Never Used  Substance and Sexual Activity  . Alcohol use: No  . Drug use: No  . Sexual activity: Not on file  Lifestyle  . Physical activity:    Days per week: Not on file    Minutes per session: Not on file  . Stress: Not on file  Relationships  . Social connections:    Talks on phone: Not on file    Gets together: Not on file    Attends religious service: Not on file    Active member of club or organization: Not on file    Attends meetings of clubs or organizations: Not on file    Relationship status: Not on file  Other Topics Concern  . Not on file  Social History  Narrative  . Not on file     Family History: The patient's family history includes Heart disease in her brother and father; Hypertension in her father and mother.  ROS:   Please see the history of present illness.    All other systems reviewed and are negative.  EKGs/Labs/Other Studies Reviewed:    The following studies were reviewed today: 2D echocardiogram 01/23/2018: Study Conclusions  - Left ventricle: The cavity size was normal. There was mild   concentric hypertrophy. Systolic function was normal. Wall motion   was normal; there were no regional wall motion abnormalities.  Doppler parameters are consistent with abnormal left ventricular   relaxation (grade 1 diastolic dysfunction). - Aortic valve: Valve mobility was restricted. There was severe   stenosis. There was no regurgitation. Peak velocity (S): 408   cm/s. Mean gradient (S): 39 mm Hg. - Mitral valve: Severely calcified annulus. The findings are   consistent with moderate stenosis. There was mild regurgitation. - Left atrium: The atrium was moderately dilated. - Right ventricle: The cavity size was normal. Wall thickness was   normal. Systolic function was normal. - Atrial septum: No defect or patent foramen ovale was identified. - Tricuspid valve: There was trivial regurgitation. - Pulmonary arteries: Systolic pressure was within the normal   range. PA peak pressure: 20 mm Hg (S).  Cardiac catheterization 01/29/2018: Conclusion    Severe aortic stenosis with peak to peak gradient 50 mmHg, mean gradient 32 mmHg, and calculated aortic valve area 0.99 cm  Normal coronary arteries with right dominant coronary anatomy.  Normal pulmonary artery pressures including pulmonary capillary wedge mean of 10 mmHg  Previously documented normal LV function by echo.  RECOMMENDATIONS:   Referred to structural heart team either Dr. Burt Knack or Dr. Angelena Form for further evaluation per the note provided by Dr. Johnsie Cancel.    Carotid Ultrasound 02/17/2018: Summary: Right Carotid: The extracranial vessels were near-normal with only minimal wall                thickening or plaque.  Left Carotid: The extracranial vessels were near-normal with only minimal wall               thickening or plaque.  STS risk score: Procedure: Isolated AVR   Risk of Mortality: 2.029%  Renal Failure: 1.801%  Permanent Stroke: 1.388%  Prolonged Ventilation: 6.475%  DSW Infection: 0.122%  Reoperation: 2.406%  Morbidity or Mortality: 10.656%  Short Length of Stay: 33.108%  Long Length of Stay: 5.190%   EKG:  EKG is not ordered today.  The ekg from 12/27 is reviewed and demonstrates NSR 72 bpm, RBBB, few PVC's  Recent Labs: 01/23/2018: BUN 17; Creatinine, Ser 0.89; Hemoglobin 13.1; Platelets 333; Potassium 3.9; Sodium 137  Recent Lipid Panel No results found for: CHOL, TRIG, HDL, CHOLHDL, VLDL, LDLCALC, LDLDIRECT  Physical Exam:    VS:  BP 136/70   Pulse 79   Ht 5\' 4"  (1.626 m)   Wt 194 lb 6.4 oz (88.2 kg)   LMP  (LMP Unknown)   SpO2 94%   BMI 33.37 kg/m     Wt Readings from Last 3 Encounters:  02/18/18 194 lb 6.4 oz (88.2 kg)  01/29/18 193 lb (87.5 kg)  01/23/18 193 lb 1.9 oz (87.6 kg)     GEN: Well nourished, well developed in no acute distress HEENT: Normal NECK: No JVD; No carotid bruits LYMPHATICS: No lymphadenopathy CARDIAC: RRR, 3/6 harsh late peaking systolic murmur at the RUSB, no diastolic murmur RESPIRATORY:  Clear to auscultation without rales, wheezing or rhonchi  ABDOMEN: Soft, non-tender, non-distended MUSCULOSKELETAL:  No edema; No deformity  SKIN: Warm and dry NEUROLOGIC:  Alert and oriented x 3 PSYCHIATRIC:  Normal affect   ASSESSMENT:    1. Severe aortic stenosis    PLAN:    In order of problems listed above:  1. This is an 84 year-old woman with diabetes and HTN who has developed severe, stage D1, symptomatic aortic stenosis. She describes NYHA functional class 2 symptoms of  chronic diastolic heart failure likely related to aortic stenosis. Her echo demonstrates severely calcified  and restricted aortic valve leaflets with hemodynamic findings confirming the presence of severe aortic stenosis. The peak transaortic velocity exceeds 4 meters/second and the mean gradient is 39 mmHg. Cardiac catheterization shows patent coronary arteries with no obstructive disease and invasive hemodynamics again confirm severe AS with a peak gradient of 50 mmHg, mean gradient 32 mmhg, and calculated valve area < 1 square cm. I have reviewed the natural history of aortic stenosis with the patient and their family members who are present today. We have discussed the limitations of medical therapy and the poor prognosis associated with symptomatic aortic stenosis. We have reviewed potential treatment options, including palliative medical therapy, conventional surgical aortic valve replacement, and transcatheter aortic valve replacement. We discussed treatment options in the context of the patient's specific comorbid medical conditions. We reviewed further evaluation and I have recommended CTA studies of the heart as well as the chest, abdomen, and pelvis. Following review of these studies, she will be referred for a formal cardiac surgical evaluation as part of a multidisciplinary approach to her care. Considering her age, TAVR may be a preferred treatment option if her anatomy is suitable. She is at increased risk of high-grade conduction disease and need for permanent pacing with TAVR because of her underlying RBBB. This is discussed with her today, as were other potential risks with TAVR.    Medication Adjustments/Labs and Tests Ordered: Current medicines are reviewed at length with the patient today.  Concerns regarding medicines are outlined above.  No orders of the defined types were placed in this encounter.  No orders of the defined types were placed in this encounter.   Patient Instructions    TAVR APPOINTMENTS:  Pre procedure instructions for CT scan on 02/23/2018. Please check in between 7:00-7:15AM in Radiology (first floor). Your CT scans (of chest/abdomen/pelvis/heart) will begin at 7:45AM.   Please follow these instructions carefully (unless otherwise directed):  On the Day/Night Before the Test:  Drink plenty of water.  Do not consume any caffeinated/decaffeinated beverages or chocolate 12 hours prior to your test.  Do not take any antihistamines 12 hours prior to your test.  On the Day of the Test:  Drink plenty of water. Do not drink any water within one hour of the test. ( 6:45AM)  Do not eat any food 4 hours prior to the test. (Nothing after 3:45AM)  You may take your regular medications prior to the test.  Take 50 mg of lopressor (metoprolol tartrate) one hour (6:45AM) before the test with a few sips of water. (prescription will be sent to pharmacy)  After the Test:  Drink plenty of water.  After receiving IV contrast, you may experience a mild flushed feeling. This is normal.  On occasion, you may experience a mild rash up to 24 hours after the test. This is not dangerous. If this occurs, you can take Benadryl 25 mg and increase your fluid intake.  If you experience trouble breathing, this can be serious. If it is severe call 911 IMMEDIATELY. If it is mild, please call our office.  Follow-Up: Your physician has requested Outpatient Physical Therapy evaluation.  This will be done at [facility] at [address], [place] [address].   This is scheduled on Wednesday, February 18, 2018 at 1:30 PM  You have been referred to Dr Cyndia Bent at [facility] for further TAVR evaluation.  [place], [place], [place], [place] [address], [phone]  You are scheduled for first surgical evaluation  with Dr Cyndia Bent on Thursday, February 26, 2018 at 4:00 PM.   Please call the office on the day of your  appointment to make sure the surgeon will be there and on time as many times there are delays or rescheduling due to emergency surgery.   If you have any questions or concerns, please do not hesitate to call.    Signed, Sherren Mocha, MD  02/22/2018 11:49 AM    [place]

## 2018-02-18 NOTE — Therapy (Signed)
[place], [place], [address] Phone: [phone]   Fax:  [phone]  Physical Therapy Evaluation  Patient Details  Name: Megan [name] MRN: [id number] Date of Birth: 15-Sep-1934 Referring Provider (PT): Dr Sherren Mocha    Encounter Date: 02/18/2018  PT End of Session - 02/18/18 1328    Visit Number  1    Number of Visits  1    Date for PT Re-Evaluation  02/18/18    PT Start Time  1320    PT Stop Time  1347    PT Time Calculation (min)  27 min    Activity Tolerance  Patient tolerated treatment well    Behavior During Therapy  [facility] for tasks assessed/performed       Past Medical History:  Diagnosis Date  . Arthritis   . Cancer ([place]) 1989   melanoma  . Diabetes mellitus without complication ([place])   . Dizziness   . GERD (gastroesophageal reflux disease)   . History of hiatal hernia   . Hypertension   . Hypothyroidism   . Severe aortic stenosis     Past Surgical History:  Procedure Laterality Date  . APPENDECTOMY     84 yrs old  . CHOLECYSTECTOMY  95  . EYE SURGERY Bilateral 2017   cataract surgery  . JOINT REPLACEMENT Right    knee   . REVERSE SHOULDER ARTHROPLASTY Left 07/14/2014  . REVERSE SHOULDER ARTHROPLASTY Left 07/14/2014   Procedure: LEFT REVERSE SHOULDER ARTHROPLASTY;  Surgeon: Meredith Pel, MD;  Location: [facility];  Service: Orthopedics;  Laterality: Left;  . RIGHT/LEFT HEART CATH AND CORONARY ANGIOGRAPHY N/A 01/29/2018   Procedure: RIGHT/LEFT HEART CATH AND CORONARY ANGIOGRAPHY;  Surgeon: Belva Crome, MD;  Location: [facility];  Service: Cardiovascular;  Laterality: N/A;  . SHOULDER ARTHROSCOPY Left   . TOTAL SHOULDER REVISION Right 10/29/2016   Procedure: RIGHT REVERSE TOTAL SHOULDER;  Surgeon: Meredith Pel, MD;  Location: [facility];  Service: Orthopedics;  Laterality: Right;    There were no vitals filed for this visit.   Subjective Assessment - 02/18/18 1322     Subjective  Patients chief complaint is fatigue with walking. She has had the heart valve problemfor about a year and a half. She has noticed it getting worse over the past 2-3 months. She also has chest pain at times.     Currently in Pain?  No/denies   She has arththritis in her spine         [facility] PT Assessment - 02/18/18 0001      Assessment   Medical Diagnosis  Severe Aortic Stenois     Referring Provider (PT)  Dr Sherren Mocha     Onset Date/Surgical Date  --   3 months prior    Hand Dominance  Right    Prior Therapy  None       Precautions   Precautions  None      Restrictions   Weight Bearing Restrictions  No      Balance Screen   Has the patient fallen in the past 6 months  No    Has the patient had a decrease in activity level because of a fear of falling?   No    Is the patient reluctant to leave their home because of a fear of falling?   No      Home Environment   Additional Comments  No steps into the home  Prior Function   Level of Independence  Independent    Vocation  Retired      Charity fundraiser Status  Within Functional Limits for tasks assessed    Attention  Focused    Focused Attention  Appears intact    Memory  Appears intact    Awareness  Appears intact    Problem Solving  Appears intact      Sensation   Light Touch  Appears Intact      Coordination   Gross Motor Movements are Fluid and Coordinated  Yes    Fine Motor Movements are Fluid and Coordinated  Yes      Posture/Postural Control   Posture Comments  rounded shoulders; forward head       ROM / Strength   AROM / PROM / Strength  AROM;PROM;Strength      AROM   Overall AROM Comments  limited funtional internal rotation bilaterl 2nd to shoulder surgeries       Strength   Overall Strength Comments  shoulder flexion 4+/5 ER/IR 5/5     Strength Assessment Site  Hand    Right/Left hand  Right;Left    Right Hand Grip (lbs)  15    Left Hand Grip (lbs)  5        [facility] Pre-Surgical Assessment - 02/18/18 0001    5 Meter Walk Test- trial 1  7 sec    5 Meter Walk Test- trial 2  10 sec.     5 Meter Walk Test- trial 3  9 sec.    5 meter walk test average  8.67 sec    4 Stage Balance Test tolerated for:   2 sec.    comment  could not maintain tandem stance     Sit To Stand Test- trial 1  5 sec.    Comment  15sec     ADL/IADL Independent with:  Bathing;Dressing;Meal prep;Finances;Yard work    6 Minute Walk- Baseline  yes    BP (mmHg)  113/62    HR (bpm)  74    02 Sat (%RA)  96 %    Modified Borg Scale for Dyspnea  0- Nothing at all    Perceived Rate of Exertion (Borg)  6-    6 Minute Walk Post Test  yes    BP (mmHg)  121/77    HR (bpm)  87    02 Sat (%RA)  96 %    Modified Borg Scale for Dyspnea  2- Mild shortness of breath    Perceived Rate of Exertion (Borg)  13- Somewhat hard    Aerobic Endurance Distance Walked  420   Endurance additional comments  1 seated rest break of 1 min 50 sec               Objective measurements completed on examination: See above findings.                           Plan - 02/18/18 1328    Clinical Impression Statement  see below     Clinical Presentation  Stable    Clinical Decision Making  Low    PT Frequency  One time visit    Consulted and Agree with Plan of Care  Patient      Clinical Impression Statement: Pt is a 84  yo F presenting to OP PT for evaluation prior to possible TAVR surgery due to severe aortic stenosis. Pt  reports onset of fatigue with functional activity approximately 3 months ago. Symptoms are limiting ability to perform ADL's. Pt presents with restrictions in bilateral functional internal rotation of shoulders 2nd to surgery and normal strength, decreased balance and is at high fall risk 4 stage balance test, decreased walking speed and decreased aerobic endurance per 6 minute walk test. Pt ambulated  feet in 277 before requesting a seated rest beak lasting  1:50. At time of rest, patient's HR was 87 bpm and O2 was 96 on room air. Pt reported 2/10 shortness of breath on modified sacle for dyspnea. Pt able to resume after rest and ambulate an additional feet. Pt ambulated a total of 420 feet in 6 minute walk. B/P increased significantly with 6 minute walk test. Based on the Short Physical Performance Battery, patient has a frailty rating of 512 with </= 5/12 considered frail.   Patient will benefit from skilled therapeutic intervention in order to improve the following deficits and impairments:     Visit Diagnosis: Other abnormalities of gait and mobility     Problem List Patient Active Problem List   Diagnosis Date Noted  . Anterior ischemic optic neuropathy of right eye 05/28/2017  . Snoring 05/28/2017  . OSA (obstructive sleep apnea) 05/28/2017  . Shoulder arthritis 10/29/2016  . Aortic stenosis 06/21/2016  . Lightheaded 06/05/2016  . Poor balance 06/05/2016  . Systolic murmur 68/61/6837  . Sensorineural hearing loss (SNHL), bilateral 02/02/2016  . Arthritis of shoulder region, left, degenerative 07/14/2014    Carney Living  PT DPT  02/18/2018, 2:01 PM  [facility] [address] [place], [place], [address] Phone: [phone]   Fax:  [phone]  Name: Megan [name] MRN: [id number] Date of Birth: 24-Apr-1934

## 2018-02-18 NOTE — Telephone Encounter (Signed)
Patient aware of results of PFT and carotid. Patient will need repeat carotid in 2 years. Will put in pulmonary referral.

## 2018-02-19 ENCOUNTER — Encounter: Payer: Medicare Other | Admitting: Surgery

## 2018-02-22 ENCOUNTER — Encounter: Payer: Self-pay | Admitting: Cardiovascular Disease

## 2018-02-23 ENCOUNTER — Ambulatory Visit (HOSPITAL_COMMUNITY)
Admission: RE | Admit: 2018-02-23 | Discharge: 2018-02-23 | Disposition: A | Discharge status: Home / Self Care | Payer: Medicare Other | Source: Ambulatory Visit | Attending: Cardiovascular Disease | Admitting: Cardiovascular Disease

## 2018-02-23 ENCOUNTER — Encounter (HOSPITAL_COMMUNITY): Payer: Self-pay

## 2018-02-23 DIAGNOSIS — R42 Dizziness and giddiness: Secondary | ICD-10-CM | POA: Insufficient documentation

## 2018-02-23 DIAGNOSIS — I350 Nonrheumatic aortic (valve) stenosis: Secondary | ICD-10-CM

## 2018-02-23 DIAGNOSIS — Z952 Presence of prosthetic heart valve: Secondary | ICD-10-CM | POA: Diagnosis not present

## 2018-02-23 MED ORDER — IOPAMIDOL (ISOVUE-370) INJECTION 76%
100.0000 mL | Freq: Once | INTRAVENOUS | Status: AC | PRN
  Administered 2018-02-23: 100 mL via INTRAVENOUS

## 2018-02-23 MED ORDER — IOPAMIDOL (ISOVUE-370) INJECTION 76%
100.0000 mL | Freq: Once | INTRAVENOUS | Status: AC | PRN
  Administered 2018-02-23: 50 mL via INTRAVENOUS

## 2018-02-26 ENCOUNTER — Encounter: Payer: Self-pay | Admitting: Surgery

## 2018-02-26 ENCOUNTER — Institutional Professional Consult (permissible substitution): Payer: Medicare Other | Admitting: Surgery

## 2018-02-26 VITALS — BP 124/64 | HR 84 | Resp 20 | Ht 64.0 in | Wt 190.0 lb

## 2018-02-26 DIAGNOSIS — I350 Nonrheumatic aortic (valve) stenosis: Secondary | ICD-10-CM

## 2018-02-26 NOTE — Progress Notes (Signed)
[facility]  MULTIDISCIPLINARY HEART VALVE CLINIC  CARDIOTHORACIC SURGERY CONSULTATION REPORT  Referring Provider is Josue Hector, MD PCP is Merrilee Seashore, MD  Chief Complaint  Patient presents with  . Aortic Stenosis    Surgical eval for TAVR, review all studies    HPI:  The patient is an 84 year old woman with a history of diabetes, hypertension, hypothyroidism, hiatal hernia with GERD, and osteoarthritis status post right total knee replacement and bilateral shoulder replacement in the past who has severe aortic stenosis and has been referred for consideration of transcatheter aortic valve replacement.  She said that she has had some dizziness in the past and was referred to neurology due to concerns about the possibility of having a stroke.  She was noted to have a heart murmur on exam and was referred to Dr. Johnsie Cancel in 2018.  An echocardiogram in December 2018 showed moderate aortic stenosis with a mean gradient of 28 mmHg and a peak gradient of 57 mmHg.  Left ventricular ejection fraction was 65 to 70% at that time with grade 2 diastolic dysfunction.  A follow-up echocardiogram on 01/23/2018 showed progression of her aortic stenosis to severe with a mean gradient of 39 mmHg and a peak gradient of 67 mmHg.  The valve area was measured at 0.68 cm with a dimensionless index of 0.24.  Left ventricular systolic function remain normal.  She reports that she has developed exertional fatigue and shortness of breath that occurs with any significant work around the house or walking long distances.  When doing her housework she has to take frequent rest breaks.  She is also noticed some mild chest discomfort at times.  She has had no orthopnea or PND.  She denies any peripheral edema.  She has had some episodes of dizziness particularly when getting up in the morning.  She said that this is different than the balance issues that she has had for a long time.  She is here today  with her son who is already had bypass surgery in the past at [facility]..  She remains functionally independent and takes care of her house and still drives a car.  She has some disability due to arthritis with back pain and has spinal injections that help about 6 to 8 months.  She has another son who lives locally.  Past Medical History:  Diagnosis Date  . Arthritis   . Cancer ([place]) 1989   melanoma  . Diabetes mellitus without complication ([place])   . Dizziness   . GERD (gastroesophageal reflux disease)   . History of hiatal hernia   . Hypertension   . Hypothyroidism   . Severe aortic stenosis     Past Surgical History:  Procedure Laterality Date  . APPENDECTOMY     84 yrs old  . CHOLECYSTECTOMY  89  . EYE SURGERY Bilateral 2017   cataract surgery  . JOINT REPLACEMENT Right    knee   . REVERSE SHOULDER ARTHROPLASTY Left 07/14/2014  . REVERSE SHOULDER ARTHROPLASTY Left 07/14/2014   Procedure: LEFT REVERSE SHOULDER ARTHROPLASTY;  Surgeon: Meredith Pel, MD;  Location: [place];  Service: Orthopedics;  Laterality: Left;  . RIGHT/LEFT HEART CATH AND CORONARY ANGIOGRAPHY N/A 01/29/2018   Procedure: RIGHT/LEFT HEART CATH AND CORONARY ANGIOGRAPHY;  Surgeon: Belva Crome, MD;  Location: [facility];  Service: Cardiovascular;  Laterality: N/A;  . SHOULDER ARTHROSCOPY Left   . TOTAL SHOULDER REVISION Right 10/29/2016   Procedure: RIGHT REVERSE TOTAL SHOULDER;  Surgeon: Meredith Pel, MD;  Location: [place];  Service: Orthopedics;  Laterality: Right;    Family History  Problem Relation Age of Onset  . Hypertension Mother   . Hypertension Father   . Heart disease Father   . Heart disease Brother     Social History   Socioeconomic History  . Marital status: Widowed    Spouse name: Not on file  . Number of children: Not on file  . Years of education: Not on file  . Highest education level: Not on file  Occupational History  . Not on file  Social Needs  . Financial  resource strain: Not on file  . Food insecurity:    Worry: Not on file    Inability: Not on file  . Transportation needs:    Medical: Not on file    Non-medical: Not on file  Tobacco Use  . Smoking status: Never Smoker  . Smokeless tobacco: Never Used  Substance and Sexual Activity  . Alcohol use: No  . Drug use: No  . Sexual activity: Not on file  Lifestyle  . Physical activity:    Days per week: Not on file    Minutes per session: Not on file  . Stress: Not on file  Relationships  . Social connections:    Talks on phone: Not on file    Gets together: Not on file    Attends religious service: Not on file    Active member of club or organization: Not on file    Attends meetings of clubs or organizations: Not on file    Relationship status: Not on file  . Intimate partner violence:    Fear of current or ex partner: Not on file    Emotionally abused: Not on file    Physically abused: Not on file    Forced sexual activity: Not on file  Other Topics Concern  . Not on file  Social History Narrative  . Not on file    Current Outpatient Medications  Medication Sig Dispense Refill  . ACCU-CHEK AVIVA PLUS test strip 1 each by Other route 2 (two) times daily.     Marland Kitchen ACCU-CHEK SOFTCLIX LANCETS lancets 1 each by Other route 2 (two) times daily.     Marland Kitchen acetaminophen (TYLENOL) 500 MG tablet Take 500-1,000 mg by mouth every 6 (six) hours as needed (pain.).    Marland Kitchen amLODipine-valsartan (EXFORGE) 5-320 MG tablet Take 1 tablet by mouth daily.  11  . calcium-vitamin D (OSCAL WITH D) 500-200 MG-UNIT tablet Take 1 tablet by mouth daily at 12 noon.     . clopidogrel (PLAVIX) 75 MG tablet Take 75 mg by mouth at bedtime.     Marland Kitchen econazole nitrate 1 % cream Apply 1 application topically daily. For nose/corners of mouth    . gabapentin (NEURONTIN) 100 MG capsule Take 1 capsule (100 mg total) by mouth at bedtime. 180 capsule 2  . glimepiride (AMARYL) 2 MG tablet Take 2-4 mg by mouth See admin  instructions. Take 1 tablet (2 mg) by mouth in the morning & take 2 tablets (4 mg) by mouth at night.  12  . hydrocortisone 2.5 % cream Apply 1 application topically daily. Applied to nose/corners of mouth    . ketotifen (ZADITOR) 0.025 % ophthalmic solution Place 1 drop into both eyes 2 (two) times daily as needed (for itchy eyes.).    Marland Kitchen Multiple Vitamin (MULTIVITAMIN WITH MINERALS) TABS tablet Take 1 tablet by mouth daily at 12  noon.     . MYRBETRIQ 50 MG TB24 tablet Take 50 mg by mouth daily.  11  . Omega-3 Fatty Acids (FISH OIL) 1200 MG CAPS Take 1,200 mg by mouth daily at 12 noon.     . rosuvastatin (CRESTOR) 5 MG tablet Take 5 mg by mouth daily.    . sitaGLIPtin (JANUVIA) 100 MG tablet Take 100 mg by mouth daily.    Marland Kitchen SYNTHROID 125 MCG tablet Take 125 mcg by mouth daily before breakfast.   12  . triamterene-hydrochlorothiazide (DYAZIDE) 37.5-25 MG per capsule Take 1 capsule by mouth at bedtime.   11  . metoprolol tartrate (LOPRESSOR) 50 MG tablet Take one hour prior to TAVR CT scan (Patient not taking: Reported on 02/26/2018) 1 tablet 0   Current Facility-Administered Medications  Medication Dose Route Frequency Provider Last Rate Last Dose  . lidocaine (PF) (XYLOCAINE) 1 % injection 2 mL  2 mL Other Once Magnus Sinning, MD        No Known Allergies    Review of Systems:   General:  normal appetite, decreased energy, no weight gain, no weight loss, no fever  Cardiac:  + chest pain with exertion, no chest pain at rest, +SOB with moderate exertion, no resting SOB, no PND, no orthopnea, no palpitations, no arrhythmia, no atrial fibrillation, no LE edema, + dizzy spells, no syncope  Respiratory:  +  shortness of breath, no home oxygen, no productive cough, no dry cough, no bronchitis, no wheezing, no hemoptysis, no asthma, no pain with inspiration or cough, no sleep apnea, no CPAP at night  GI:   no difficulty swallowing, + reflux, no frequent heartburn, + hiatal hernia, no abdominal  pain, no constipation, no diarrhea, no hematochezia, no hematemesis, no melena  GU:   no dysuria,  no frequency, no urinary tract infection, no hematuria,  no kidney stones, no kidney disease  Vascular:  no pain suggestive of claudication, no pain in feet, no leg cramps, no varicose veins, no DVT, no non-healing foot ulcer  Neuro:   ?+ possible stroke in past, no TIA's, no seizures, no headaches, no temporary blindness one eye,  no slurred speech, no peripheral neuropathy, + chronic pain, + instability of gait, no memory/cognitive dysfunction  Musculoskeletal: + arthritis, + joint swelling, + myalgias, some difficulty walking, + reduced mobility   Skin:   no rash, no itching, no skin infections, no pressure sores or ulcerations  Psych:   no anxiety, no depression, no nervousness, no unusual recent stress  Eyes:   no blurry vision, no floaters, no recent vision changes, + wears glasses or contacts  ENT:   no hearing loss, no loose or painful teeth, full dentures  Hematologic:  no easy bruising, no abnormal bleeding, no clotting disorder, no frequent epistaxis  Endocrine:  + diabetes, does  check CBG's at home           Physical Exam:   BP 124/64   Pulse 84   Resp 20   Ht 5\' 4"  (1.626 m)   Wt 86.2 kg   LMP  (LMP Unknown)   SpO2 95% Comment: RA  BMI 32.61 kg/m   General:  Elderly but  well-appearing  HEENT:  Unremarkable, NCAT, PERLA, EOMI, oropharynx clear, dentures in.  Neck:   no JVD, no bruits, no adenopathy or thyromegaly  Chest:   clear to auscultation, symmetrical breath sounds, no wheezes, no rhonchi   CV:   RRR, grade III/VI crescendo/decrescendo murmur heard best at RSB,  no diastolic murmur  Abdomen:  soft, non-tender, no masses or organomegaly  Extremities:  warm, well-perfused, pulses palpable in feet, no LE edema  Rectal/GU  Deferred  Neuro:   Grossly non-focal and symmetrical throughout  Skin:   Clean and dry, no rashes, no breakdown   Diagnostic Tests:      [facility]                        [address], [place] [address]                            [phone]  ------------------------------------------------------------------- Transthoracic Echocardiography  Patient:    Megan [name], Megan [name] MR #:       [id number] Study Date: 01/23/2018 Gender:     F Age:        81 Height:     161.3 cm Weight:     88.9 kg BSA:        2.03 m^2 Pt. Status: Room:   Jetty Duhamel, M.D.  REFERRING    Jenkins Rouge, M.D.  SONOGRAPHER  Oletta Lamas, Will  PERFORMING   [facility], Outpatient  ATTENDING    Skeet Latch, MD  REFERRING    [facility]  cc:  -------------------------------------------------------------------  ------------------------------------------------------------------- Indications:      (I35.0).  ------------------------------------------------------------------- History:   PMH:  Acquired from the patient and from the patient&'s chart.  Aortic stenosis.  Risk factors:  Hypertension. Diabetes mellitus.  ------------------------------------------------------------------- Study Conclusions  - Left ventricle: The cavity size was normal. There was mild   concentric hypertrophy. Systolic function was normal. Wall motion   was normal; there were no regional wall motion abnormalities.   Doppler parameters are consistent with abnormal left ventricular   relaxation (grade 1 diastolic dysfunction). - Aortic valve: Valve mobility was restricted. There was severe   stenosis. There was no regurgitation. Peak velocity (S): 408   cm/s. Mean gradient (S): 39 mm Hg. - Mitral valve: Severely calcified annulus. The findings are   consistent with moderate stenosis. There was mild regurgitation. - Left atrium: The atrium was moderately dilated. - Right ventricle: The cavity size was normal. Wall thickness was   normal. Systolic function was normal. - Atrial septum: No defect  or patent foramen ovale was identified. - Tricuspid valve: There was trivial regurgitation. - Pulmonary arteries: Systolic pressure was within the normal   range. PA peak pressure: 20 mm Hg (S).  ------------------------------------------------------------------- Study data:  Comparison was made to the study of 01/13/2017.  Study status:  Routine.  Procedure:  The patient reported no pain pre or post test. Transthoracic echocardiography for left ventricular function evaluation and for assessment of valvular function. Image quality was adequate.  Study completion:  There were no complications.          Transthoracic echocardiography.  M-mode, complete 2D, spectral Doppler, and color Doppler.  Birthdate: Patient birthdate: 1934/03/07.  Age:  Patient is 84 yr old.  Sex: Gender: female.    BMI: 34.2 kg/m^2.  Blood pressure:     142/68 Patient status:  Outpatient.  Study date:  Study date: 01/23/2018. Study time: 08:34 AM.  Location:  [facility]  -------------------------------------------------------------------  ------------------------------------------------------------------- Left ventricle:  The cavity size was normal. There  was mild concentric hypertrophy. Systolic function was normal. Wall motion was normal; there were no regional wall motion abnormalities. Doppler parameters are consistent with abnormal left ventricular relaxation (grade 1 diastolic dysfunction).  ------------------------------------------------------------------- Aortic valve:   Trileaflet; severely thickened, severely calcified leaflets. Valve mobility was restricted.  Doppler:   There was severe stenosis.   There was no regurgitation.    VTI ratio of LVOT to aortic valve: 0.23. Valve area (VTI): 0.64 cm^2. Indexed valve area (VTI): 0.32 cm^2/m^2. Peak velocity ratio of LVOT to aortic valve: 0.24. Valve area (Vmax): 0.69 cm^2. Indexed valve area (Vmax): 0.34 cm^2/m^2. Mean velocity ratio of LVOT to  aortic valve: 0.24. Valve area (Vmean): 0.68 cm^2. Indexed valve area (Vmean): 0.34 cm^2/m^2.    Mean gradient (S): 39 mm Hg. Peak gradient (S): 67 mm Hg.  ------------------------------------------------------------------- Aorta:  Aortic root: The aortic root was normal in size.  ------------------------------------------------------------------- Mitral valve:   Severely calcified annulus. Mobility was not restricted.  Doppler:   The findings are consistent with moderate stenosis.   There was mild regurgitation.    Valve area by pressure half-time: 1.93 cm^2. Indexed valve area by pressure half-time: 0.95 cm^2/m^2. Valve area by continuity equation (using LVOT flow): 1.27 cm^2. Indexed valve area by continuity equation (using LVOT flow): 0.62 cm^2/m^2.    Peak gradient (D): 10 mm Hg.  ------------------------------------------------------------------- Left atrium:  The atrium was moderately dilated.  ------------------------------------------------------------------- Atrial septum:  No defect or patent foramen ovale was identified.   ------------------------------------------------------------------- Right ventricle:  The cavity size was normal. Wall thickness was normal. Systolic function was normal.  ------------------------------------------------------------------- Pulmonic valve:    Structurally normal valve.   Cusp separation was normal.  Doppler:  Transvalvular velocity was within the normal range. There was no evidence for stenosis. There was no regurgitation.  ------------------------------------------------------------------- Tricuspid valve:   Structurally normal valve.    Doppler: Transvalvular velocity was within the normal range. There was trivial regurgitation.  ------------------------------------------------------------------- Pulmonary artery:   The main pulmonary artery was normal-sized. Systolic pressure was within the normal  range.  ------------------------------------------------------------------- Right atrium:  The atrium was normal in size.  ------------------------------------------------------------------- Pericardium:  There was no pericardial effusion.  ------------------------------------------------------------------- Systemic veins: Inferior vena cava: The vessel was normal in size. The respirophasic diameter changes were in the normal range (>= 50%), consistent with normal central venous pressure.  ------------------------------------------------------------------- Measurements   Left ventricle                          Value           Reference  LV ID, ED, PLAX chordal                 43     mm       43 - 52  LV ID, ES, PLAX chordal                 26     mm       23 - 38  LV fx shortening, PLAX chordal          40     %        >=29  LV PW thickness, ED                     13     mm       ----------  IVS/LV PW ratio, ED  1               <=1.3  Stroke volume, 2D                       64     ml       ----------  Stroke volume/bsa, 2D                   31     ml/m^2   ----------    Ventricular septum                      Value           Reference  IVS thickness, ED                       13     mm       ----------    LVOT                                    Value           Reference  LVOT ID, S                              19     mm       ----------  LVOT area                               2.84   cm^2     ----------  LVOT ID                                 19     mm       ----------  LVOT peak velocity, S                   99.8   cm/s     ----------  LVOT mean velocity, S                   70.1   cm/s     ----------  LVOT VTI, S                             22.4   cm       ----------  Stroke volume (SV), LVOT DP             63.5   ml       ----------  Stroke index (SV/bsa), LVOT DP          31.2   ml/m^2   ----------    Aortic valve                            Value            Reference  Aortic valve peak velocity, S           408    cm/s     ----------  Aortic valve mean velocity, S           291    cm/s     ----------  Aortic valve VTI, S                     99     cm       ----------  Aortic mean gradient, S                 39     mm Hg    ----------  Aortic peak gradient, S                 67     mm Hg    ----------  VTI ratio, LVOT/AV                      0.23            ----------  Aortic valve area, VTI                  0.64   cm^2     ----------  Aortic valve area/bsa, VTI              0.32   cm^2/m^2 ----------  Velocity ratio, peak, LVOT/AV           0.24            ----------  Aortic valve area, peak                 0.69   cm^2     ----------  velocity  Aortic valve area/bsa, peak             0.34   cm^2/m^2 ----------  velocity  Velocity ratio, mean, LVOT/AV           0.24            ----------  Aortic valve area, mean                 0.68   cm^2     ----------  velocity  Aortic valve area/bsa, mean             0.34   cm^2/m^2 ----------  velocity    Aorta                                   Value           Reference  Aortic root ID, ED                      32     mm       ----------  Ascending aorta ID, A-P, S              31     mm       ----------    Left atrium                             Value           Reference  LA ID, A-P, ES                          35     mm       ----------  LA ID/bsa, A-P                          1.72  cm/m^2   <=2.2  LA volume, S                            58     ml       ----------  LA volume/bsa, S                        28.5   ml/m^2   ----------  LA volume, ES, 1-p A4C                  42.6   ml       ----------  LA volume/bsa, ES, 1-p A4C              21     ml/m^2   ----------  LA volume, ES, 1-p A2C                  72.3   ml       ----------  LA volume/bsa, ES, 1-p A2C              35.6   ml/m^2   ----------    Mitral valve                            Value           Reference  Mitral E-wave peak  velocity             160    cm/s     ----------  Mitral A-wave peak velocity             168    cm/s     ----------  Mitral deceleration time        (H)     388    ms       150 - 230  Mitral pressure half-time               114    ms       ----------  Mitral peak gradient, D                 10     mm Hg    ----------  Mitral E/A ratio, peak                  1               ----------  Mitral valve area, PHT, DP              1.93   cm^2     ----------  Mitral valve area/bsa, PHT, DP          0.95   cm^2/m^2 ----------  Mitral valve area, LVOT                 1.27   cm^2     ----------  continuity  Mitral valve area/bsa, LVOT             0.62   cm^2/m^2 ----------  continuity    Pulmonary arteries                      Value           Reference  PA pressure, S, DP  20     mm Hg    <=30    Tricuspid valve                         Value           Reference  Tricuspid regurg peak velocity          204.93 cm/s     ----------  Tricuspid peak RV-RA gradient           17     mm Hg    ----------  Tricuspid maximal regurg                204.93 cm/s     ----------  velocity, PISA    Right atrium                            Value           Reference  RA ID, S-I, ES, A4C                     47.7   mm       34 - 49  RA area, ES, A4C                        11.8   cm^2     8.3 - 19.5  RA volume, ES, A/L                      24.4   ml       ----------  RA volume/bsa, ES, A/L                  12     ml/m^2   ----------    Systemic veins                          Value           Reference  Estimated CVP                           3      mm Hg    ----------    Right ventricle                         Value           Reference  TAPSE                                   19.2   mm       ----------  RV pressure, S, DP                      20     mm Hg    <=30  RV s&', lateral, S                       10.6   cm/s     ----------  Legend: (L)  and  (H)  mark values outside specified reference  range.  ------------------------------------------------------------------- Prepared and Electronically Authenticated by  Skeet Latch, MD 2019-12-27T09:33:41   Physicians   Panel Physicians Referring Physician Case Authorizing  Physician  Belva Crome, MD (Primary)    Procedures   RIGHT/LEFT HEART CATH AND CORONARY ANGIOGRAPHY  Conclusion    Severe aortic stenosis with peak to peak gradient 50 mmHg, mean gradient 32 mmHg, and calculated aortic valve area 0.99 cm  Normal coronary arteries with right dominant coronary anatomy.  Normal pulmonary artery pressures including pulmonary capillary wedge mean of 10 mmHg  Previously documented normal LV function by echo.  RECOMMENDATIONS:   Referred to structural heart team either Dr. Burt Knack or Dr. Angelena Form for further evaluation per the note provided by Dr. Johnsie Cancel.  Recommendations   Antiplatelet/Anticoag Recommend Aspirin 81mg  daily for moderate CAD.  Surgeon Notes     01/29/2018 8:30 AM CV Procedure signed by Belva Crome, MD  Indications   Calcific aortic stenosis [I35.0 (ICD-10-CM)]  Procedural Details   Technical Details The right radial area was sterilely prepped and draped. Intravenous sedation with Versed and fentanyl was administered. 1% Xylocaine was infiltrated to achieve local analgesia. Using real-time vascular ultrasound, a double wall stick with an angiocath was utilized to obtain intra-arterial access. A VUS image was saved for the permanent record.The modified Seldinger technique was used to place a 55F " Slender" sheath in the right radial artery. Weight based heparin was administered. Coronary angiography was done using 5 F catheters. Angiography was difficult because of significant tortuosity/angulation between the innominate artery and the ascending aorta. Right coronary angiography was performed with a JR4. Left ventricular hemodymic recordings and angiography was done using the JR 4 catheter and hand  injection. Left coronary angiography was performed with a JL 3.5 cm.   Right heart catheterization was performed by exchanging a previously placed antecubital IV angio-cath for a 5 French Slender sheath. 1% Xylocaine was used to locally nesthetize the area around the IV site. The IV catheter was wired using an .018 guidewire. The modified Seldinger technique was used to place the 5 [place] sheath. Double glove technique was used to enhance sterility. After sheath insertion, right heart cath was performed using a 5 French balloon tipped catheter and fluoroscopic guidance. Pressures were recorded in each chamber and in the pulmonary capillary wedge position.. The main pulmonary artery O2 saturation was sampled.   Hemostasis was achieved using a pneumatic band.  During this procedure the patient is administered a total of Versed 1 mg and Fentanyl 50 mg to achieve and maintain moderate conscious sedation. The patient's heart rate, blood pressure, and oxygen saturation are monitored continuously during the procedure. The period of conscious sedation is 39 minutes, of which I was present face-to-face 100% of this time. Estimated blood loss <50 mL.   During this procedure medications were administered to achieve and maintain moderate conscious sedation while the patient's heart rate, blood pressure, and oxygen saturation were continuously monitored and I was present face-to-face 100% of this time.  Medications  (Filter: Administrations occurring from 01/29/18 0713 to 01/29/18 0836)  Medication Rate/Dose/Volume Action  Date Time   Heparin (Porcine) in NaCl 1000-0.9 UT/500ML-% SOLN (mL) 500 mL Given 01/29/18 0727   Total dose as of 02/26/18 1855        500 mL        Heparin (Porcine) in NaCl 1000-0.9 UT/500ML-% SOLN (mL) 500 mL Given 01/29/18 0727   Total dose as of 02/26/18 1855        500 mL        fentaNYL (SUBLIMAZE) injection (mcg) 25 mcg Given 01/29/18 0745   Total dose as of 02/26/18 1855  25  mcg        midazolam (VERSED) injection (mg) 0.5 mg Given 01/29/18 0745   Total dose as of 02/26/18 1855 0.5 mg Canceled Entry 0753   0.5 mg        lidocaine (PF) (XYLOCAINE) 1 % injection (mL) 5 mL Given 01/29/18 0755   Total dose as of 02/26/18 1855 2 mL Given 0756   7 mL        Radial Cocktail/Verapamil only (mL) 10 mL Given 01/29/18 0809   Total dose as of 02/26/18 1855        10 mL        heparin injection (Units) 4,500 Units Given 01/29/18 0811   Total dose as of 02/26/18 1855        4,500 Units        iohexol (OMNIPAQUE) 350 MG/ML injection (mL) 70 mL Given 01/29/18 0826   Total dose as of 02/26/18 1855        70 mL        midazolam (VERSED) injection (mg) 0.5 mg Given 01/29/18 0809   Total dose as of 02/26/18 1855        0.5 mg        fentaNYL (SUBLIMAZE) injection (mcg) 25 mcg Given 01/29/18 0809   Total dose as of 02/26/18 1855        25 mcg        Sedation Time   Sedation Time Physician-1: 39 minutes 6 seconds  Coronary Findings   Diagnostic  Dominance: Right  No diagnostic findings have been documented.  Intervention   No interventions have been documented.  Right Heart   Right Heart Pressures Hemodynamic findings consistent with pulmonary hypertension.  Left Heart   Left Ventricle LV end diastolic pressure is normal.  Coronary Diagrams   Diagnostic  Dominance: Right    Intervention   Implants    No implant documentation for this case.  Syngo Images   Show images for CARDIAC CATHETERIZATION  MERGE Images   Show images for CARDIAC CATHETERIZATION   Link to Procedure Log   Procedure Log    Hemo Data    Most Recent Value  Fick Cardiac Output 6.55 L/min  Fick Cardiac Output Index 3.39 (L/min)/BSA  Aortic Mean Gradient 32.44 mmHg  Aortic Peak Gradient 50 mmHg  Aortic Valve Area 0.99  Aortic Value Area Index 0.51 cm2/BSA  RA A Wave 7 mmHg  RA V Wave 5 mmHg  RA Mean 3 mmHg  RV Systolic Pressure 35 mmHg  RV Diastolic Pressure -1 mmHg  RV  EDP 6 mmHg  PA Systolic Pressure 33 mmHg  PA Diastolic Pressure 12 mmHg  PA Mean 22 mmHg  PW A Wave 16 mmHg  PW V Wave 18 mmHg  PW Mean 10 mmHg  AO Systolic Pressure 706 mmHg  AO Diastolic Pressure 50 mmHg  AO Mean 72 mmHg  LV Systolic Pressure 237 mmHg  LV Diastolic Pressure -6 mmHg  LV EDP 7 mmHg  AOp Systolic Pressure 628 mmHg  AOp Diastolic Pressure 48 mmHg  AOp Mean Pressure 73 mmHg  LVp Systolic Pressure 315 mmHg  LVp Diastolic Pressure 0 mmHg  LVp EDP Pressure 3 mmHg  QP/QS 1  TPVR Index 6.49 HRUI  TSVR Index 20.64 HRUI  PVR SVR Ratio 0.18  TPVR/TSVR Ratio 0.31   ADDENDUM REPORT: 02/23/2018 14:42  EXAM: OVER-READ INTERPRETATION  CT CHEST  The following report is an over-read performed by Dr.  Earleen Newport of [facility]  on 02/23/2018. This  over-read does not include interpretation of cardiac or coronary  anatomy or pathology. The coronary CTA interpretation by the  cardiologist is attached.  COMPARISON:   None.  FINDINGS:  Extracardiac findings will be described separately under dictation  for contemporaneously obtained CTA chest, abdomen and pelvis.  IMPRESSION: Please see separate dictation for contemporaneously obtained CTA  chest, abdomen and pelvis dated 02/19/2018 for full description of  relevant extracardiac findings.   Electronically Signed   By: Corrie Mckusick D.O.   On: 02/23/2018 14:42   Addended by Corrie Mckusick, DO on 02/23/2018 2:45 PM    Study Result   CLINICAL DATA:  Aortic Stenosis  EXAM: Cardiac TAVR CT  TECHNIQUE: The patient was scanned on a Siemens Force 627 slice scanner. A 120 kV retrospective scan was triggered in the ascending thoracic aorta at 140 HU's. Gantry rotation speed was 250 msecs and collimation was .6 mm. No beta blockade or nitro were given. The 3D data set was reconstructed in 5% intervals of the R-R cycle. Systolic and diastolic phases were analyzed on a dedicated work  station using MPR, MIP and VRT modes. The patient received 80 cc of contrast.  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 and tortuous left carotid see separate radiology report  Sino-tubular Junction: 2.4 cm  Ascending Thoracic Aorta: 2.9 cm  Aortic Arch: 2.3 cm  Descending Thoracic Aorta: 2.0 cm  Sinus of Valsalva Measurements:  Non-coronary: 28 mm  Right - coronary: 25.6 mm  Left -   coronary: 26.3 mm  Coronary Artery Height above Annulus:  Left Main: 12.8 mm above annulus  Right Coronary: 13.6 mm above annulus  Virtual Basal Annulus Measurements:  Maximum / Minimum Diameter: 25.7 mm x 19.5 mm  Perimeter: 74 mm  Area: 415 mm2  Coronary Arteries: Sufficient height above annulus for deployment  Optimum Fluoroscopic Angle for Delivery: LAO 3 Cranial 2 degrees  IMPRESSION: 1. Tri leaflet AV with annular area of 415 mm2 suitable for a 23 mm Sapien 3 valve  2. Optimum angiographic angle for deployment LAO 3 Cranial 2 degrees  3.  Normal aortic root size 2.9 cm?  Anomalous arch vessels  4.  Coronary arteries suitable height above annulus for deployment  5.  No LAA thrombus  Jenkins Rouge  Electronically Signed: By: Jenkins Rouge M.D. On: 02/23/2018 12:25      CLINICAL DATA:  84 year old female with a history of preoperative evaluation for potential trans arterial aortic valve replacement  EXAM: CT ANGIOGRAPHY CHEST, ABDOMEN AND PELVIS  TECHNIQUE: Multidetector CT imaging through the chest, abdomen and pelvis was performed using the standard protocol during bolus administration of intravenous contrast. Multiplanar reconstructed images and MIPs were obtained and reviewed to evaluate the vascular anatomy.  Gated cardiac study is also included, which has been interpreted and build by separate provider,  under a separate accession number.  CONTRAST:  60mL ISOVUE-370 IOPAMIDOL (ISOVUE-370) INJECTION 76%  COMPARISON:  None.  FINDINGS: CTA CHEST FINDINGS  Cardiovascular:  Heart:  Borderline enlarged heart. No pericardial fluid/thickening. Calcified atherosclerosis of the left anterior descending coronary artery.  Calcifications of the aortic valve and the mitral annulus.  Diameter of the sino-tubular junction 2.5 cm. Greatest diameter of the ascending aorta measures 3.1 cm. Mild atherosclerotic changes of the aortic arch.  Aberrant origin of the right subclavian artery.  There is a common trunk of the  right common carotid artery and the left common carotid artery. Separate origin of the left subclavian artery. No significant atherosclerotic changes at the origin of these vessels.  Bilateral vertebral arteries and the bilateral common carotid artery are patent at the neck base. Dominant left vertebral artery.  Left subclavian artery patent. Left axillary artery patent. No significant stenosis or significant atherosclerotic changes.  Aorta:  Mild atherosclerotic changes of the aortic arch. Proximal descending aorta measures 2.2 cm. No dissection. No periaortic fluid. No aneurysm. Minimal tortuosity.  Greatest diameter just above the aortic hiatus measures 19 mm.  Pulmonary arteries:  Contrast bolus timing is not optimized for evaluation of the pulmonary arteries. Main pulmonary artery measures 2.5 cm.  Mediastinum/Nodes: Circumferential thickening of the length of the thoracic esophagus. Multiple lymph nodes throughout the mediastinum, some of which are calcified, predominantly right-sided. This includes right lowest paratracheal right hilar, and right peer bronchial.  Unremarkable appearance of the thoracic inlet.  Lungs/Pleura: Azygos lobe. Centrilobular emphysema. Calcified granuloma of the right upper lobe on image 37 of series 15.  Minimal pattern of centrilobular nodularity at the periphery of the left upper lobe. No confluent airspace disease. No endotracheal or endobronchial debris. No peribronchial thickening. No pleural effusion or pneumothorax.  Musculoskeletal: Surgical changes of bilateral glenohumeral joint arthroplasty. No acute displaced fracture identified. No aggressive sclerotic or lucent bony lesions. Flowing anterior osteophyte production of the thoracic spine. No acute displaced fracture.  Review of the MIP images confirms the above findings.  CTA ABDOMEN AND PELVIS FINDINGS  VASCULAR  Aorta: Minimal atherosclerotic changes of the abdominal aorta. No aneurysm, dissection, stenosis, or periaortic fluid.  Celiac: Minimal atherosclerotic changes at the celiac artery origin with no stenosis or occlusion. Branch vessels are patent.  SMA: No significant atherosclerotic changes at the SMA origin. Branches are patent.  Renals: Minimal atherosclerotic changes at the origin the bilateral renal arteries with no high-grade stenosis by CT imaging. Normal course caliber and contour of the bilateral renal arteries. The right renal artery is the lowest.  IMA: IMA is patent.  Right lower extremity:  Relatively normal course caliber and contour of the right iliac arterial system with minimal atherosclerotic change. No high-grade stenosis or occlusion. The smallest diameter of the right common iliac artery estimated 9 mm-10 mm. Hypogastric artery remains patent.  External iliac artery is patent with the diameter approximately 8 mm.  Common femoral artery without significant atherosclerotic changes. Diameter measures 9 mm.  Proximal profunda femoris and SFA patent.  Left lower extremity:  Relatively normal course caliber and contour of the left iliac system without significant tortuosity, atherosclerotic changes, stenosis or occlusion. Hypogastric artery is patent.  Smallest estimated diameter of the common iliac artery is 9 mm-10 mm.  Saw list estimated diameter of the external iliac artery 7 mm.  No significant atherosclerotic changes of the common femoral artery, with the diameter estimated 8 mm-9 mm.  Proximal SFA and profunda femoris patent.  Veins: Unremarkable appearance of the venous system.  Review of the MIP images confirms the above findings.  NON-VASCULAR  Hepatobiliary: Unremarkable appearance of the liver. Cholecystectomy  Pancreas: Unremarkable pancreas  Spleen: Unremarkable.  Adrenals/Urinary Tract: Unremarkable appearance of adrenal glands.  Right:  No hydronephrosis. Symmetric perfusion to the left. No nephrolithiasis. Unremarkable course of the right ureter.  Left:  No hydronephrosis. Symmetric perfusion to the right. No nephrolithiasis. Unremarkable course of the left ureter.  Tiny gas at the anti dependent urinary bladder which is partially distended. No associated inflammation or  urinary bladder wall thickening.  Stomach/Bowel: Unremarkable appearance of the stomach. Unremarkable appearance of small bowel. No evidence of obstruction. Appendix is not visualized, however, no inflammatory changes are present adjacent to the cecum to indicate an appendicitis. Colonic diverticula without associated inflammation.  Lymphatic: No adenopathy  Mesenteric: No free fluid or air. No adenopathy.  Reproductive: Unremarkable appearance of uterus and adnexa  Other: Small fat containing umbilical hernia  Musculoskeletal: No acute displaced fracture. Degenerative changes of the lumbar spine. Grade 1 anterolisthesis of L4 on L5. Vacuum disc phenomenon of L5-S1. No significant bony canal narrowing.  IMPRESSION: No acute finding.  **An incidental finding of potential clinical significance has been found. There is long segment circumferential thickening of the thoracic esophagus, of uncertain  significance. Differential includes inflammation as well as tumor. Correlation with history of symptoms of esophagitis/GERD, as well as possibly upper endoscopy recommended**  Multiple mediastinal lymph nodes. Etiology uncertain, potentially related to chronic granulomatous disease, chronic inflammation from esophagitis/GER D, myeloproliferative disorder, or, if there is occult esophageal tumor, potentially mediastinal metastases.  Mild atherosclerotic changes of the descending thoracic aorta and the abdominal aorta with no significant tortuosity, high-grade stenosis, or occlusion. Aortic Atherosclerosis (ICD10-I70.0).  Minimal atherosclerotic changes of the bilateral iliac arteries with no significant stenosis, occlusion, or tortuosity. Smallest diameter on the right in the EIA estimated 8 mm, and on the left in the EIA estimated 7 mm.  A right axillary/brachial trans arterial approach for deployment is precluded by the presence of aberrant origin of the right subclavian.  Minimal pattern of centrilobular nodularity of the left upper lobe, potentially inflammatory/infectious versus chronic changes of granulomatous disease or MAI. No evidence of lobar pneumonia or edema.  Imaging diagnoses specific to the cardiac structures/left ventricular outflow tract have been performed and billed by a separate provider.  Signed,  Dulcy Fanny. Dellia Nims, RPVI  [facility]  [facility]   Electronically Signed   By: Corrie Mckusick D.O.   On: 02/23/2018 14:40   STS risk score: Procedure: Isolated AVR   Risk of Mortality: 2.029%  Renal Failure: 1.801%  Permanent Stroke: 1.388%  Prolonged Ventilation: 6.475%  DSW Infection: 0.122%  Reoperation: 2.406%  Morbidity or Mortality: 10.656%  Short Length of Stay: 33.108%  Long Length of Stay: 5.190%   Impression:  This 84 year old woman has stage D, severe, symptomatic  aortic stenosis with New York Heart Association class II symptoms of exertional fatigue and shortness of breath consistent with chronic diastolic congestive heart failure.  She is also having some exertional chest discomfort and dizziness.  I have personally reviewed her 2D echocardiogram, cardiac catheterization, and CTA studies.  Her echocardiogram shows a trileaflet aortic valve with calcification of the leaflets and restricted mobility with a mean gradient of 39 mmHg consistent with severe aortic stenosis.  Left ventricular ejection fraction is normal.  There is grade 2 diastolic dysfunction.  Cardiac catheterization shows no significant coronary disease.  The mean gradient across aortic valve is 32 mmHg with a calculated valve area of 0.99 cm.  I agree that aortic valve replacement is indicated in this patient.  Given her age and disability due to degenerative arthritis I think that transcatheter aortic valve replacement would be the best treatment for her.  Her gated cardiac CTA shows anatomy favorable for transcatheter aortic valve replacement using a 23 mm Sapien 3 valve with no complicating features.  Her abdominal and pelvic CTA shows adequate pelvic vascular anatomy to allow transfemoral insertion.  The  patient and her son were counseled at length regarding treatment alternatives for management of severe symptomatic aortic stenosis. The risks and benefits of surgical intervention has been discussed in detail. Long-term prognosis with medical therapy was discussed. Alternative approaches such as conventional surgical aortic valve replacement, transcatheter aortic valve replacement, and palliative medical therapy were compared and contrasted at length. This discussion was placed in the context of the patient's own specific clinical presentation and past medical history. All of their questions have been addressed.   Following the decision to proceed with transcatheter aortic valve replacement, a  discussion was held regarding what types of management strategies would be attempted intraoperatively in the event of life-threatening complications, including whether or not the patient would be considered a candidate for the use of cardiopulmonary bypass and/or conversion to open sternotomy for attempted surgical intervention. The patient is aware of the fact that transient use of cardiopulmonary bypass may be necessary.  Although she is 84 years old she is still relatively active and independent and is a low surgical risk patient so I think she would be a candidate for sternotomy if needed for emergent correction of any intraoperative complications.  The patient has been advised of a variety of complications that might develop including but not limited to risks of death, stroke, paravalvular leak, aortic dissection or other major vascular complications, aortic annulus rupture, device embolization, cardiac rupture or perforation, mitral regurgitation, acute myocardial infarction, arrhythmia, heart block or bradycardia requiring permanent pacemaker placement, congestive heart failure, respiratory failure, renal failure, pneumonia, infection, other late complications related to structural valve deterioration or migration, or other complications that might ultimately cause a temporary or permanent loss of functional independence or other long term morbidity. The patient provides full informed consent for the procedure as described and all questions were answered.     Plan:  Transfemoral transcatheter aortic valve replacement.  We will plan to schedule this on Tuesday, 03/03/2018 if the patient wants to proceed on that date.  I spent 80 minutes performing this consultation and > 50% of this time was spent face to face counseling and coordinating the care of this patient's severe symptomatic aortic stenosis.   Gaye Pollack, MD 02/26/2018 6:41 PM

## 2018-02-27 ENCOUNTER — Other Ambulatory Visit: Payer: Self-pay

## 2018-02-27 DIAGNOSIS — I350 Nonrheumatic aortic (valve) stenosis: Secondary | ICD-10-CM

## 2018-03-02 ENCOUNTER — Ambulatory Visit (HOSPITAL_COMMUNITY)
Admission: RE | Admit: 2018-03-02 | Discharge: 2018-03-02 | Disposition: A | Discharge status: Home / Self Care | Payer: Medicare Other | Source: Ambulatory Visit | Attending: Cardiovascular Disease | Admitting: Cardiovascular Disease

## 2018-03-02 ENCOUNTER — Encounter (HOSPITAL_COMMUNITY)
Admission: RE | Admit: 2018-03-02 | Discharge: 2018-03-02 | Disposition: A | Discharge status: Home / Self Care | Payer: Medicare Other | Source: Ambulatory Visit | Attending: Cardiovascular Disease | Admitting: Cardiovascular Disease

## 2018-03-02 ENCOUNTER — Encounter (HOSPITAL_COMMUNITY): Payer: Self-pay

## 2018-03-02 ENCOUNTER — Other Ambulatory Visit: Payer: Self-pay

## 2018-03-02 DIAGNOSIS — I5032 Chronic diastolic (congestive) heart failure: Secondary | ICD-10-CM | POA: Diagnosis not present

## 2018-03-02 DIAGNOSIS — M2578 Osteophyte, vertebrae: Secondary | ICD-10-CM | POA: Diagnosis not present

## 2018-03-02 DIAGNOSIS — H903 Sensorineural hearing loss, bilateral: Secondary | ICD-10-CM | POA: Diagnosis not present

## 2018-03-02 DIAGNOSIS — E118 Type 2 diabetes mellitus with unspecified complications: Secondary | ICD-10-CM | POA: Diagnosis not present

## 2018-03-02 DIAGNOSIS — J432 Centrilobular emphysema: Secondary | ICD-10-CM | POA: Diagnosis not present

## 2018-03-02 DIAGNOSIS — Z006 Encounter for examination for normal comparison and control in clinical research program: Secondary | ICD-10-CM | POA: Diagnosis not present

## 2018-03-02 DIAGNOSIS — I350 Nonrheumatic aortic (valve) stenosis: Secondary | ICD-10-CM

## 2018-03-02 DIAGNOSIS — G4733 Obstructive sleep apnea (adult) (pediatric): Secondary | ICD-10-CM | POA: Diagnosis not present

## 2018-03-02 DIAGNOSIS — R2681 Unsteadiness on feet: Secondary | ICD-10-CM | POA: Diagnosis not present

## 2018-03-02 DIAGNOSIS — Z01818 Encounter for other preprocedural examination: Secondary | ICD-10-CM | POA: Diagnosis not present

## 2018-03-02 DIAGNOSIS — E039 Hypothyroidism, unspecified: Secondary | ICD-10-CM | POA: Diagnosis not present

## 2018-03-02 DIAGNOSIS — G8929 Other chronic pain: Secondary | ICD-10-CM | POA: Diagnosis not present

## 2018-03-02 DIAGNOSIS — K449 Diaphragmatic hernia without obstruction or gangrene: Secondary | ICD-10-CM | POA: Diagnosis not present

## 2018-03-02 DIAGNOSIS — I2510 Atherosclerotic heart disease of native coronary artery without angina pectoris: Secondary | ICD-10-CM | POA: Diagnosis not present

## 2018-03-02 DIAGNOSIS — I110 Hypertensive heart disease with heart failure: Secondary | ICD-10-CM | POA: Diagnosis not present

## 2018-03-02 DIAGNOSIS — I700 Atherosclerosis of aorta: Secondary | ICD-10-CM | POA: Diagnosis not present

## 2018-03-02 DIAGNOSIS — M1990 Unspecified osteoarthritis, unspecified site: Secondary | ICD-10-CM | POA: Diagnosis not present

## 2018-03-02 DIAGNOSIS — R5082 Postprocedural fever: Secondary | ICD-10-CM | POA: Diagnosis not present

## 2018-03-02 DIAGNOSIS — Z85820 Personal history of malignant melanoma of skin: Secondary | ICD-10-CM | POA: Diagnosis not present

## 2018-03-02 DIAGNOSIS — K219 Gastro-esophageal reflux disease without esophagitis: Secondary | ICD-10-CM | POA: Diagnosis not present

## 2018-03-02 DIAGNOSIS — Z96651 Presence of right artificial knee joint: Secondary | ICD-10-CM | POA: Diagnosis not present

## 2018-03-02 DIAGNOSIS — M4316 Spondylolisthesis, lumbar region: Secondary | ICD-10-CM | POA: Diagnosis not present

## 2018-03-02 DIAGNOSIS — E119 Type 2 diabetes mellitus without complications: Secondary | ICD-10-CM | POA: Diagnosis not present

## 2018-03-02 DIAGNOSIS — Z96612 Presence of left artificial shoulder joint: Secondary | ICD-10-CM | POA: Diagnosis not present

## 2018-03-02 DIAGNOSIS — Z96611 Presence of right artificial shoulder joint: Secondary | ICD-10-CM | POA: Diagnosis not present

## 2018-03-02 DIAGNOSIS — I080 Rheumatic disorders of both mitral and aortic valves: Secondary | ICD-10-CM | POA: Diagnosis not present

## 2018-03-02 DIAGNOSIS — I4510 Unspecified right bundle-branch block: Secondary | ICD-10-CM | POA: Diagnosis not present

## 2018-03-02 HISTORY — DX: Anemia, unspecified: D64.9

## 2018-03-02 HISTORY — DX: Irritable bowel syndrome, unspecified: K58.9

## 2018-03-02 LAB — PROTIME-INR
INR: 1.04
Prothrombin Time: 13.5 seconds (ref 11.4–15.2)

## 2018-03-02 LAB — URINALYSIS, ROUTINE W REFLEX MICROSCOPIC
Bilirubin Urine: NEGATIVE
Glucose, UA: NEGATIVE mg/dL
Hgb urine dipstick: NEGATIVE
Ketones, ur: NEGATIVE mg/dL
Leukocytes, UA: NEGATIVE
Nitrite: NEGATIVE
PH: 5 (ref 5.0–8.0)
Protein, ur: NEGATIVE mg/dL
SPECIFIC GRAVITY, URINE: 1.014 (ref 1.005–1.030)

## 2018-03-02 LAB — BLOOD GAS, ARTERIAL
Acid-base deficit: 1.2 mmol/L (ref 0.0–2.0)
Bicarbonate: 22.1 mmol/L (ref 20.0–28.0)
Drawn by: 470591
FIO2: 21
O2 SAT: 96.3 %
Patient temperature: 98.6
pCO2 arterial: 31.3 mmHg — ABNORMAL LOW (ref 32.0–48.0)
pH, Arterial: 7.463 — ABNORMAL HIGH (ref 7.350–7.450)
pO2, Arterial: 84.4 mmHg (ref 83.0–108.0)

## 2018-03-02 LAB — TYPE AND SCREEN
ABO/RH(D): B POS
Antibody Screen: NEGATIVE

## 2018-03-02 LAB — COMPREHENSIVE METABOLIC PANEL
ALT: 10 U/L (ref 0–44)
ANION GAP: 15 (ref 5–15)
AST: 22 U/L (ref 15–41)
Albumin: 3.8 g/dL (ref 3.5–5.0)
Alkaline Phosphatase: 62 U/L (ref 38–126)
BUN: 19 mg/dL (ref 8–23)
CO2: 18 mmol/L — ABNORMAL LOW (ref 22–32)
Calcium: 9.2 mg/dL (ref 8.9–10.3)
Chloride: 105 mmol/L (ref 98–111)
Creatinine, Ser: 1 mg/dL (ref 0.44–1.00)
GFR calc non Af Amer: 53 mL/min — ABNORMAL LOW (ref >60)
Glucose, Bld: 257 mg/dL — ABNORMAL HIGH (ref 70–99)
POTASSIUM: 3.7 mmol/L (ref 3.5–5.1)
Sodium: 138 mmol/L (ref 135–145)
Total Bilirubin: 0.7 mg/dL (ref 0.3–1.2)
Total Protein: 7.4 g/dL (ref 6.5–8.1)

## 2018-03-02 LAB — CBC
HCT: 39.8 % (ref 36.0–46.0)
Hemoglobin: 12.4 g/dL (ref 12.0–15.0)
MCH: 28.3 pg (ref 26.0–34.0)
MCHC: 31.2 g/dL (ref 30.0–36.0)
MCV: 90.9 fL (ref 80.0–100.0)
Platelets: 271 10*3/uL (ref 150–400)
RBC: 4.38 MIL/uL (ref 3.87–5.11)
RDW: 13.3 % (ref 11.5–15.5)
WBC: 7.8 10*3/uL (ref 4.0–10.5)
nRBC: 0 % (ref 0.0–0.2)

## 2018-03-02 LAB — SURGICAL PCR SCREEN
MRSA, PCR: NEGATIVE
Staphylococcus aureus: NEGATIVE

## 2018-03-02 LAB — BRAIN NATRIURETIC PEPTIDE: B Natriuretic Peptide: 52.4 pg/mL (ref 0.0–100.0)

## 2018-03-02 LAB — HEMOGLOBIN A1C
Hgb A1c MFr Bld: 7.7 % — ABNORMAL HIGH (ref 4.8–5.6)
Mean Plasma Glucose: 174.29 mg/dL

## 2018-03-02 LAB — GLUCOSE, CAPILLARY: Glucose-Capillary: 186 mg/dL — ABNORMAL HIGH (ref 70–99)

## 2018-03-02 LAB — APTT: aPTT: 31 seconds (ref 24–36)

## 2018-03-02 MED ORDER — PHENYLEPHRINE HCL-NACL 20-0.9 MG/250ML-% IV SOLN
30.0000 ug/min | INTRAVENOUS | Status: DC
  Filled 2018-03-02: qty 250

## 2018-03-02 MED ORDER — DOPAMINE-DEXTROSE 3.2-5 MG/ML-% IV SOLN
0.0000 ug/kg/min | INTRAVENOUS | Status: DC
  Filled 2018-03-02: qty 250

## 2018-03-02 MED ORDER — NITROGLYCERIN IN D5W 200-5 MCG/ML-% IV SOLN
2.0000 ug/min | INTRAVENOUS | Status: DC
  Filled 2018-03-02: qty 250

## 2018-03-02 MED ORDER — MAGNESIUM SULFATE 50 % IJ SOLN
40.0000 meq | INTRAMUSCULAR | Status: DC
  Filled 2018-03-02: qty 9.85

## 2018-03-02 MED ORDER — POTASSIUM CHLORIDE 2 MEQ/ML IV SOLN
80.0000 meq | INTRAVENOUS | Status: DC
  Filled 2018-03-02: qty 40

## 2018-03-02 MED ORDER — SODIUM CHLORIDE 0.9 % IV SOLN
1.5000 g | INTRAVENOUS | Status: AC
  Administered 2018-03-03: 1.5 g via INTRAVENOUS
  Filled 2018-03-02: qty 1.5

## 2018-03-02 MED ORDER — EPINEPHRINE PF 1 MG/ML IJ SOLN
0.0000 ug/min | INTRAVENOUS | Status: DC
  Filled 2018-03-02: qty 4

## 2018-03-02 MED ORDER — NOREPINEPHRINE BITARTRATE 1 MG/ML IV SOLN
0.0000 ug/min | INTRAVENOUS | Status: DC
  Filled 2018-03-02: qty 4

## 2018-03-02 MED ORDER — VANCOMYCIN HCL 10 G IV SOLR
1250.0000 mg | INTRAVENOUS | Status: AC
  Administered 2018-03-03: 1250 mg via INTRAVENOUS
  Filled 2018-03-02: qty 1250

## 2018-03-02 MED ORDER — DEXMEDETOMIDINE HCL IN NACL 400 MCG/100ML IV SOLN
0.1000 ug/kg/h | INTRAVENOUS | Status: AC
  Administered 2018-03-03: 1 ug/kg/h via INTRAVENOUS
  Filled 2018-03-02: qty 100

## 2018-03-02 MED ORDER — SODIUM CHLORIDE 0.9 % IV SOLN
INTRAVENOUS | Status: DC
  Filled 2018-03-02: qty 30

## 2018-03-02 MED ORDER — INSULIN REGULAR(HUMAN) IN NACL 100-0.9 UT/100ML-% IV SOLN
INTRAVENOUS | Status: DC
  Filled 2018-03-02: qty 100

## 2018-03-02 NOTE — H&P (Signed)
[address]       [place] [address]             [phone]      Cardiothoracic Surgery Admission History and Physical   Referring Provider is Josue Hector, MD PCP is Merrilee Seashore, MD      Chief Complaint  Patient presents with  . Aortic Stenosis        HPI:  The patient is an 84 year old woman with a history of diabetes, hypertension, hypothyroidism, hiatal hernia with GERD, and osteoarthritis status post right total knee replacement and bilateral shoulder replacement in the past who has severe aortic stenosis and has been referred for consideration of transcatheter aortic valve replacement.  She said that she has had some dizziness in the past and was referred to neurology due to concerns about the possibility of having a stroke.  She was noted to have a heart murmur on exam and was referred to Dr. Johnsie Cancel in 2018.  An echocardiogram in December 2018 showed moderate aortic stenosis with a mean gradient of 28 mmHg and a peak gradient of 57 mmHg.  Left ventricular ejection fraction was 65 to 70% at that time with grade 2 diastolic dysfunction.  A follow-up echocardiogram on 01/23/2018 showed progression of her aortic stenosis to severe with a mean gradient of 39 mmHg and a peak gradient of 67 mmHg.  The valve area was measured at 0.68 cm with a dimensionless index of 0.24.  Left ventricular systolic function remain normal.  She reports that she has developed exertional fatigue and shortness of breath that occurs with any significant work around the house or walking long distances.  When doing her housework she has to take frequent rest breaks.  She is also noticed some mild chest discomfort at times.  She has had no orthopnea or PND.  She denies any peripheral edema.  She has had some episodes of dizziness particularly when getting up in the morning.  She said that this is different than the balance issues that she has had for a long time.  She  remains functionally independent and takes care of her house and still drives a car.  She has some disability due to arthritis with back pain and has spinal injections that help about 6 to 8 months.  She has two sons who live locally.      Past Medical History:  Diagnosis Date  . Arthritis   . Cancer ([place]) 1989   melanoma  . Diabetes mellitus without complication ([place])   . Dizziness   . GERD (gastroesophageal reflux disease)   . History of hiatal hernia   . Hypertension   . Hypothyroidism   . Severe aortic stenosis          Past Surgical History:  Procedure Laterality Date  . APPENDECTOMY     84 yrs old  . CHOLECYSTECTOMY  89  . EYE SURGERY Bilateral 2017   cataract surgery  . JOINT REPLACEMENT Right    knee   . REVERSE SHOULDER ARTHROPLASTY Left 07/14/2014  . REVERSE SHOULDER ARTHROPLASTY Left 07/14/2014   Procedure: LEFT REVERSE SHOULDER ARTHROPLASTY;  Surgeon: Meredith Pel, MD;  Location: [facility];  Service: Orthopedics;  Laterality: Left;  . RIGHT/LEFT HEART CATH AND CORONARY ANGIOGRAPHY N/A 01/29/2018   Procedure: RIGHT/LEFT HEART CATH AND CORONARY ANGIOGRAPHY;  Surgeon: Belva Crome, MD;  Location: [facility];  Service: Cardiovascular;  Laterality: N/A;  . SHOULDER ARTHROSCOPY Left   .  TOTAL SHOULDER REVISION Right 10/29/2016   Procedure: RIGHT REVERSE TOTAL SHOULDER;  Surgeon: Meredith Pel, MD;  Location: [facility];  Service: Orthopedics;  Laterality: Right;         Family History  Problem Relation Age of Onset  . Hypertension Mother   . Hypertension Father   . Heart disease Father   . Heart disease Brother     Social History        Socioeconomic History  . Marital status: Widowed    Spouse name: Not on file  . Number of children: Not on file  . Years of education: Not on file  . Highest education level: Not on file  Occupational History  . Not on file  Social Needs  . Financial resource strain: Not on file   . Food insecurity:    Worry: Not on file    Inability: Not on file  . Transportation needs:    Medical: Not on file    Non-medical: Not on file  Tobacco Use  . Smoking status: Never Smoker  . Smokeless tobacco: Never Used  Substance and Sexual Activity  . Alcohol use: No  . Drug use: No  . Sexual activity: Not on file  Lifestyle  . Physical activity:    Days per week: Not on file    Minutes per session: Not on file  . Stress: Not on file  Relationships  . Social connections:    Talks on phone: Not on file    Gets together: Not on file    Attends religious service: Not on file    Active member of club or organization: Not on file    Attends meetings of clubs or organizations: Not on file    Relationship status: Not on file  . Intimate partner violence:    Fear of current or ex partner: Not on file    Emotionally abused: Not on file    Physically abused: Not on file    Forced sexual activity: Not on file  Other Topics Concern  . Not on file  Social History Narrative  . Not on file          Current Outpatient Medications  Medication Sig Dispense Refill  . ACCU-CHEK AVIVA PLUS test strip 1 each by Other route 2 (two) times daily.     Marland Kitchen ACCU-CHEK SOFTCLIX LANCETS lancets 1 each by Other route 2 (two) times daily.     Marland Kitchen acetaminophen (TYLENOL) 500 MG tablet Take 500-1,000 mg by mouth every 6 (six) hours as needed (pain.).    Marland Kitchen amLODipine-valsartan (EXFORGE) 5-320 MG tablet Take 1 tablet by mouth daily.  11  . calcium-vitamin D (OSCAL WITH D) 500-200 MG-UNIT tablet Take 1 tablet by mouth daily at 12 noon.     . clopidogrel (PLAVIX) 75 MG tablet Take 75 mg by mouth at bedtime.     Marland Kitchen econazole nitrate 1 % cream Apply 1 application topically daily. For nose/corners of mouth    . gabapentin (NEURONTIN) 100 MG capsule Take 1 capsule (100 mg total) by mouth at bedtime. 180 capsule 2  . glimepiride (AMARYL) 2 MG tablet Take 2-4 mg by  mouth See admin instructions. Take 1 tablet (2 mg) by mouth in the morning & take 2 tablets (4 mg) by mouth at night.  12  . hydrocortisone 2.5 % cream Apply 1 application topically daily. Applied to nose/corners of mouth    . ketotifen (ZADITOR) 0.025 % ophthalmic solution Place 1 drop into both  eyes 2 (two) times daily as needed (for itchy eyes.).    Marland Kitchen Multiple Vitamin (MULTIVITAMIN WITH MINERALS) TABS tablet Take 1 tablet by mouth daily at 12 noon.     Marland Kitchen MYRBETRIQ 50 MG TB24 tablet Take 50 mg by mouth daily.  11  . Omega-3 Fatty Acids (FISH OIL) 1200 MG CAPS Take 1,200 mg by mouth daily at 12 noon.     . rosuvastatin (CRESTOR) 5 MG tablet Take 5 mg by mouth daily.    . sitaGLIPtin (JANUVIA) 100 MG tablet Take 100 mg by mouth daily.    Marland Kitchen SYNTHROID 125 MCG tablet Take 125 mcg by mouth daily before breakfast.   12  . triamterene-hydrochlorothiazide (DYAZIDE) 37.5-25 MG per capsule Take 1 capsule by mouth at bedtime.   11  . metoprolol tartrate (LOPRESSOR) 50 MG tablet Take one hour prior to TAVR CT scan (Patient not taking: Reported on 02/26/2018) 1 tablet 0            Current Facility-Administered Medications  Medication Dose Route Frequency Provider Last Rate Last Dose  . lidocaine (PF) (XYLOCAINE) 1 % injection 2 mL  2 mL Other Once Magnus Sinning, MD        No Known Allergies    Review of Systems:              General:                      normal appetite, decreased energy, no weight gain, no weight loss, no fever             Cardiac:                       + chest pain with exertion, no chest pain at rest, +SOB with moderate exertion, no resting SOB, no PND, no orthopnea, no palpitations, no arrhythmia, no atrial fibrillation, no LE edema, + dizzy spells, no syncope             Respiratory:                 +  shortness of breath, no home oxygen, no productive cough, no dry cough, no bronchitis, no wheezing, no hemoptysis, no asthma, no pain with inspiration or  cough, no sleep apnea, no CPAP at night             GI:                               no difficulty swallowing, + reflux, no frequent heartburn, + hiatal hernia, no abdominal pain, no constipation, no diarrhea, no hematochezia, no hematemesis, no melena             GU:                              no dysuria,  no frequency, no urinary tract infection, no hematuria,  no kidney stones, no kidney disease             Vascular:                     no pain suggestive of claudication, no pain in feet, no leg cramps, no varicose veins, no DVT, no non-healing foot ulcer             Neuro:                         ?+  possible stroke in past, no TIA's, no seizures, no headaches, no temporary blindness one eye,  no slurred speech, no peripheral neuropathy, + chronic pain, + instability of gait, no memory/cognitive dysfunction             Musculoskeletal:         + arthritis, + joint swelling, + myalgias, some difficulty walking, + reduced mobility              Skin:                            no rash, no itching, no skin infections, no pressure sores or ulcerations             Psych:                         no anxiety, no depression, no nervousness, no unusual recent stress             Eyes:                           no blurry vision, no floaters, no recent vision changes, + wears glasses or contacts             ENT:                            no hearing loss, no loose or painful teeth, full dentures             Hematologic:               no easy bruising, no abnormal bleeding, no clotting disorder, no frequent epistaxis             Endocrine:                   + diabetes, does  check CBG's at home                                                       Physical Exam:              BP 124/64   Pulse 84   Resp 20   Ht 5\' 4"  (1.626 m)   Wt 86.2 kg   LMP  (LMP Unknown)   SpO2 95% Comment: RA  BMI 32.61 kg/m              General:                      Elderly but  well-appearing             HEENT:                        Unremarkable, NCAT, PERLA, EOMI, oropharynx clear, dentures in.             Neck:                           no JVD, no bruits, no adenopathy or thyromegaly             Chest:  clear to auscultation, symmetrical breath sounds, no wheezes, no rhonchi              CV:                              RRR, grade III/VI crescendo/decrescendo murmur heard best at RSB,  no diastolic murmur             Abdomen:                    soft, non-tender, no masses or organomegaly             Extremities:                 warm, well-perfused, pulses palpable in feet, no LE edema             Rectal/GU                   Deferred             Neuro:                         Grossly non-focal and symmetrical throughout             Skin:                            Clean and dry, no rashes, no breakdown   Diagnostic Tests:  [facility] [address], [place] [address] [phone]  ------------------------------------------------------------------- Transthoracic Echocardiography  Patient: Shyann, Hefner MR #: [id number] Study Date: 01/23/2018 Gender: F Age: 16 Height: 161.3 cm Weight: 88.9 kg BSA: 2.03 m^2 Pt. Status: Room:  Jetty Duhamel, M.D. REFERRING Jenkins Rouge, M.D. SONOGRAPHER Oletta Lamas, Will PERFORMING [facility], Outpatient ATTENDING Skeet Latch, MD REFERRING [facility], [facility] Medical  cc:  -------------------------------------------------------------------  ------------------------------------------------------------------- Indications: (I35.0).  ------------------------------------------------------------------- History: PMH: Acquired from the patient and from the patient&'s chart. Aortic stenosis. Risk factors: Hypertension.  Diabetes mellitus.  ------------------------------------------------------------------- Study Conclusions  - Left ventricle: The cavity size was normal. There was mild concentric hypertrophy. Systolic function was normal. Wall motion was normal; there were no regional wall motion abnormalities. Doppler parameters are consistent with abnormal left ventricular relaxation (grade 1 diastolic dysfunction). - Aortic valve: Valve mobility was restricted. There was severe stenosis. There was no regurgitation. Peak velocity (S): 408 cm/s. Mean gradient (S): 39 mm Hg. - Mitral valve: Severely calcified annulus. The findings are consistent with moderate stenosis. There was mild regurgitation. - Left atrium: The atrium was moderately dilated. - Right ventricle: The cavity size was normal. Wall thickness was normal. Systolic function was normal. - Atrial septum: No defect or patent foramen ovale was identified. - Tricuspid valve: There was trivial regurgitation. - Pulmonary arteries: Systolic pressure was within the normal range. PA peak pressure: 20 mm Hg (S).  ------------------------------------------------------------------- Study data: Comparison was made to the study of 01/13/2017. Study status: Routine. Procedure: The patient reported no pain pre or post test. Transthoracic echocardiography for left ventricular function evaluation and for assessment of valvular function. Image quality was adequate. Study completion: There were no complications. Transthoracic echocardiography. M-mode, complete 2D, spectral Doppler, and color Doppler. Birthdate: Patient birthdate: 12/05/1934. Age: Patient is 83 yr old. Sex: Gender: female. BMI: 34.2 kg/m^2. Blood pressure: 142/68 Patient status: Outpatient. Study date: Study date: 01/23/2018. Study time: 08:34 AM.  Location:  [facility]  -------------------------------------------------------------------  ------------------------------------------------------------------- Left ventricle: The cavity size was normal. There was mild concentric hypertrophy. Systolic function was normal. Wall motion was normal; there were no regional wall motion abnormalities. Doppler parameters are consistent with abnormal left ventricular relaxation (grade 1 diastolic dysfunction).  ------------------------------------------------------------------- Aortic valve: Trileaflet; severely thickened, severely calcified leaflets. Valve mobility was restricted. Doppler: There was severe stenosis. There was no regurgitation. VTI ratio of LVOT to aortic valve: 0.23. Valve area (VTI): 0.64 cm^2. Indexed valve area (VTI): 0.32 cm^2/m^2. Peak velocity ratio of LVOT to aortic valve: 0.24. Valve area (Vmax): 0.69 cm^2. Indexed valve area (Vmax): 0.34 cm^2/m^2. Mean velocity ratio of LVOT to aortic valve: 0.24. Valve area (Vmean): 0.68 cm^2. Indexed valve area (Vmean): 0.34 cm^2/m^2. Mean gradient (S): 39 mm Hg. Peak gradient (S): 67 mm Hg.  ------------------------------------------------------------------- Aorta: Aortic root: The aortic root was normal in size.  ------------------------------------------------------------------- Mitral valve: Severely calcified annulus. Mobility was not restricted. Doppler: The findings are consistent with moderate stenosis. There was mild regurgitation. Valve area by pressure half-time: 1.93 cm^2. Indexed valve area by pressure half-time: 0.95 cm^2/m^2. Valve area by continuity equation (using LVOT flow): 1.27 cm^2. Indexed valve area by continuity equation (using LVOT flow): 0.62 cm^2/m^2. Peak gradient (D): 10 mm Hg.  ------------------------------------------------------------------- Left atrium: The atrium was moderately  dilated.  ------------------------------------------------------------------- Atrial septum: No defect or patent foramen ovale was identified.  ------------------------------------------------------------------- Right ventricle: The cavity size was normal. Wall thickness was normal. Systolic function was normal.  ------------------------------------------------------------------- Pulmonic valve: Structurally normal valve. Cusp separation was normal. Doppler: Transvalvular velocity was within the normal range. There was no evidence for stenosis. There was no regurgitation.  ------------------------------------------------------------------- Tricuspid valve: Structurally normal valve. Doppler: Transvalvular velocity was within the normal range. There was trivial regurgitation.  ------------------------------------------------------------------- Pulmonary artery: The main pulmonary artery was normal-sized. Systolic pressure was within the normal range.  ------------------------------------------------------------------- Right atrium: The atrium was normal in size.  ------------------------------------------------------------------- Pericardium: There was no pericardial effusion.  ------------------------------------------------------------------- Systemic veins: Inferior vena cava: The vessel was normal in size. The respirophasic diameter changes were in the normal range (>= 50%), consistent with normal central venous pressure.  ------------------------------------------------------------------- Measurements  Left ventricle Value Reference LV ID, ED, PLAX chordal 43 mm 43 - 52 LV ID, ES, PLAX chordal 26 mm 23 - 38 LV fx shortening, PLAX chordal 40 % >=29 LV PW thickness, ED 13 mm ---------- IVS/LV PW ratio,  ED 1 <=1.3 Stroke volume, 2D 64 ml ---------- Stroke volume/bsa, 2D 31 ml/m^2 ----------  Ventricular septum Value Reference IVS thickness, ED 13 mm ----------  LVOT Value Reference LVOT ID, S 19 mm ---------- LVOT area 2.84 cm^2 ---------- LVOT ID 19 mm ---------- LVOT peak velocity, S 99.8 cm/s ---------- LVOT mean velocity, S 70.1 cm/s ---------- LVOT VTI, S 22.4 cm ---------- Stroke volume (SV), LVOT DP 63.5 ml ---------- Stroke index (SV/bsa), LVOT DP 31.2 ml/m^2 ----------  Aortic valve Value Reference Aortic valve peak velocity, S 408 cm/s ---------- Aortic valve mean velocity, S 291 cm/s ---------- Aortic valve VTI, S 99 cm ---------- Aortic mean gradient, S 39 mm Hg ---------- Aortic peak gradient, S 67 mm Hg ---------- VTI ratio, LVOT/AV 0.23 ---------- Aortic valve area, VTI 0.64 cm^2 ---------- Aortic valve area/bsa, VTI 0.32 cm^2/m^2 ---------- Velocity ratio, peak, LVOT/AV 0.24 ---------- Aortic valve area, peak 0.69 cm^2 ---------- velocity Aortic valve area/bsa, peak 0.34 cm^2/m^2 ---------- velocity Velocity ratio, mean, LVOT/AV 0.24 ---------- Aortic valve area, mean 0.68 cm^2  ----------  velocity Aortic valve area/bsa, mean 0.34 cm^2/m^2 ---------- velocity  Aorta Value Reference Aortic root ID, ED 32 mm ---------- Ascending aorta ID, A-P, S 31 mm ----------  Left atrium Value Reference LA ID, A-P, ES 35 mm ---------- LA ID/bsa, A-P 1.72 cm/m^2 <=2.2 LA volume, S 58 ml ---------- LA volume/bsa, S 28.5 ml/m^2 ---------- LA volume, ES, 1-p A4C 42.6 ml ---------- LA volume/bsa, ES, 1-p A4C 21 ml/m^2 ---------- LA volume, ES, 1-p A2C 72.3 ml ---------- LA volume/bsa, ES, 1-p A2C 35.6 ml/m^2 ----------  Mitral valve Value Reference Mitral E-wave peak velocity 160 cm/s ---------- Mitral A-wave peak velocity 168 cm/s ---------- Mitral deceleration time (H) 388 ms 150 - 230 Mitral pressure half-time 114 ms ---------- Mitral peak gradient, D 10 mm Hg ---------- Mitral E/A ratio, peak 1 ---------- Mitral valve area, PHT, DP 1.93 cm^2 ---------- Mitral valve area/bsa, PHT, DP 0.95 cm^2/m^2 ---------- Mitral valve area, LVOT 1.27 cm^2 ---------- continuity Mitral valve area/bsa, LVOT 0.62 cm^2/m^2 ---------- continuity  Pulmonary arteries Value Reference PA pressure, S, DP 20 mm Hg <=30  Tricuspid valve Value Reference Tricuspid regurg peak  velocity 204.93 cm/s ---------- Tricuspid peak RV-RA gradient 17 mm Hg ---------- Tricuspid maximal regurg 204.93 cm/s ---------- velocity, PISA  Right atrium Value Reference RA ID, S-I, ES, A4C 47.7 mm 34 - 49 RA area, ES, A4C 11.8 cm^2 8.3 - 19.5 RA volume, ES, A/L 24.4 ml ---------- RA volume/bsa, ES, A/L 12 ml/m^2 ----------  Systemic veins Value Reference Estimated CVP 3 mm Hg ----------  Right ventricle Value Reference TAPSE 19.2 mm ---------- RV pressure, S, DP 20 mm Hg <=30 RV s&', lateral, S 10.6 cm/s ----------  Legend: (L) and (H) mark values outside specified reference range.  ------------------------------------------------------------------- Prepared and Electronically Authenticated by  Skeet Latch, MD 2019-12-27T09:33:41   Physicians   Panel Physicians Referring Physician Case Authorizing Physician  Belva Crome, MD (Primary)    Procedures   RIGHT/LEFT HEART CATH AND CORONARY ANGIOGRAPHY  Conclusion    Severe aortic stenosis with peak to peak gradient 50 mmHg, mean gradient 32 mmHg, and calculated aortic valve area 0.99 cm  Normal coronary arteries with right dominant coronary anatomy.  Normal pulmonary artery pressures including pulmonary capillary wedge mean of 10 mmHg  Previously documented normal LV function by echo.  RECOMMENDATIONS:   Referred to structural heart team either Dr. Burt Knack or Dr. Angelena Form for further evaluation per the note provided by Dr. Johnsie Cancel.  Recommendations   Antiplatelet/Anticoag Recommend  Aspirin 81mg  daily for moderate CAD.  Surgeon Notes     01/29/2018 8:30 AM CV Procedure signed by Belva Crome, MD  Indications   Calcific aortic stenosis [I35.0 (ICD-10-CM)]  Procedural Details   Technical Details The right radial area was sterilely prepped and draped. Intravenous sedation with Versed and fentanyl was administered. 1% Xylocaine was infiltrated to achieve local analgesia. Using real-time vascular ultrasound, a double wall stick with an angiocath was utilized to obtain intra-arterial access. A VUS image was saved for the permanent record.The modified Seldinger technique was used to place a 75F " Slender" sheath in the right radial artery. Weight based heparin was administered. Coronary angiography was done using 5 F catheters. Angiography was difficult because of significant tortuosity/angulation between the innominate artery and the ascending aorta. Right coronary angiography was performed with a JR4. Left ventricular hemodymic recordings and angiography was done using the JR 4 catheter and hand injection. Left coronary angiography was performed with a JL 3.5 cm.   Right heart  catheterization was performed by exchanging a previously placed antecubital IV angio-cath for a 5 French Slender sheath. 1% Xylocaine was used to locally nesthetize the area around the IV site. The IV catheter was wired using an .018 guidewire. The modified Seldinger technique was used to place the 5 [place] sheath. Double glove technique was used to enhance sterility. After sheath insertion, right heart cath was performed using a 5 French balloon tipped catheter and fluoroscopic guidance. Pressures were recorded in each chamber and in the pulmonary capillary wedge position.. The main pulmonary artery O2 saturation was sampled.   Hemostasis was achieved using a pneumatic band.  During this procedure the patient is administered a total of Versed 1 mg and Fentanyl 50 mg to achieve and maintain moderate  conscious sedation. The patient's heart rate, blood pressure, and oxygen saturation are monitored continuously during the procedure. The period of conscious sedation is 39 minutes, of which I was present face-to-face 100% of this time. Estimated blood loss <50 mL.   During this procedure medications were administered to achieve and maintain moderate conscious sedation while the patient's heart rate, blood pressure, and oxygen saturation were continuously monitored and I was present face-to-face 100% of this time.  Medications  (Filter: Administrations occurring from 01/29/18 0713 to 01/29/18 0836)          Medication Rate/Dose/Volume Action  Date Time   Heparin (Porcine) in NaCl 1000-0.9 UT/500ML-% SOLN (mL) 500 mL Given 01/29/18 0727   Total dose as of 02/26/18 1855        500 mL        Heparin (Porcine) in NaCl 1000-0.9 UT/500ML-% SOLN (mL) 500 mL Given 01/29/18 0727   Total dose as of 02/26/18 1855        500 mL        fentaNYL (SUBLIMAZE) injection (mcg) 25 mcg Given 01/29/18 0745   Total dose as of 02/26/18 1855        25 mcg        midazolam (VERSED) injection (mg) 0.5 mg Given 01/29/18 0745   Total dose as of 02/26/18 1855 0.5 mg Canceled Entry 0753   0.5 mg        lidocaine (PF) (XYLOCAINE) 1 % injection (mL) 5 mL Given 01/29/18 0755   Total dose as of 02/26/18 1855 2 mL Given 0756   7 mL        Radial Cocktail/Verapamil only (mL) 10 mL Given 01/29/18 0809   Total dose as of 02/26/18 1855        10 mL        heparin injection (Units) 4,500 Units Given 01/29/18 0811   Total dose as of 02/26/18 1855        4,500 Units        iohexol (OMNIPAQUE) 350 MG/ML injection (mL) 70 mL Given 01/29/18 0826   Total dose as of 02/26/18 1855        70 mL        midazolam (VERSED) injection (mg) 0.5 mg Given 01/29/18 0809   Total dose as of 02/26/18 1855        0.5 mg        fentaNYL  (SUBLIMAZE) injection (mcg) 25 mcg Given 01/29/18 0809   Total dose as of 02/26/18 1855        25 mcg        Sedation Time   Sedation Time Physician-1: 39 minutes 6 seconds  Coronary Findings   Diagnostic  Dominance: Right  No diagnostic findings have been documented.  Intervention   No interventions have been documented.  Right Heart   Right Heart Pressures Hemodynamic findings consistent with pulmonary hypertension.  Left Heart   Left Ventricle LV end diastolic pressure is normal.  Coronary Diagrams   Diagnostic  Dominance: Right    Intervention   Implants    No implant documentation for this case.  Syngo Images      Show images for CARDIAC CATHETERIZATION  MERGE Images   Show images for CARDIAC CATHETERIZATION   Link to Procedure Log   Procedure Log    Hemo Data    Most Recent Value  Fick Cardiac Output 6.55 L/min  Fick Cardiac Output Index 3.39 (L/min)/BSA  Aortic Mean Gradient 32.44 mmHg  Aortic Peak Gradient 50 mmHg  Aortic Valve Area 0.99  Aortic Value Area Index 0.51 cm2/BSA  RA A Wave 7 mmHg  RA V Wave 5 mmHg  RA Mean 3 mmHg  RV Systolic Pressure 35 mmHg  RV Diastolic Pressure -1 mmHg  RV EDP 6 mmHg  PA Systolic Pressure 33 mmHg  PA Diastolic Pressure 12 mmHg  PA Mean 22 mmHg  PW A Wave 16 mmHg  PW V Wave 18 mmHg  PW Mean 10 mmHg  AO Systolic Pressure 932 mmHg  AO Diastolic Pressure 50 mmHg  AO Mean 72 mmHg  LV Systolic Pressure 671 mmHg  LV Diastolic Pressure -6 mmHg  LV EDP 7 mmHg  AOp Systolic Pressure 245 mmHg  AOp Diastolic Pressure 48 mmHg  AOp Mean Pressure 73 mmHg  LVp Systolic Pressure 809 mmHg  LVp Diastolic Pressure 0 mmHg  LVp EDP Pressure 3 mmHg  QP/QS 1  TPVR Index 6.49 HRUI  TSVR Index 20.64 HRUI  PVR SVR Ratio 0.18  TPVR/TSVR Ratio 0.31   ADDENDUM REPORT: 02/23/2018 14:42  EXAM: OVER-READ INTERPRETATION CT CHEST  The following report is an over-read performed by Dr.  Earleen Newport  of [facility] on 02/23/2018. This  over-read does not include interpretation of cardiac or coronary  anatomy or pathology. The coronary CTA interpretation by the  cardiologist is attached.  COMPARISON: None.  FINDINGS:  Extracardiac findings will be described separately under dictation  for contemporaneously obtained CTA chest, abdomen and pelvis.  IMPRESSION: Please see separate dictation for contemporaneously obtained CTA  chest, abdomen and pelvis dated 02/19/2018 for full description of  relevant extracardiac findings.   Electronically Signed By: Corrie Mckusick D.O. On: 02/23/2018 14:42   Addended by Corrie Mckusick, DO on 02/23/2018 2:45 PM    Study Result   CLINICAL DATA: Aortic Stenosis  EXAM: Cardiac TAVR CT  TECHNIQUE: The patient was scanned on a Siemens Force 983 slice scanner. A 120 kV retrospective scan was triggered in the ascending thoracic aorta at 140 HU's. Gantry rotation speed was 250 msecs and collimation was .6 mm. No beta blockade or nitro were given. The 3D data set was reconstructed in 5% intervals of the R-R cycle. Systolic and diastolic phases were analyzed on a dedicated work station using MPR, MIP and VRT modes. The patient received 80 cc of contrast.  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 and tortuous left carotid see separate radiology report  Sino-tubular Junction: 2.4 cm  Ascending Thoracic Aorta: 2.9 cm  Aortic Arch: 2.3 cm  Descending Thoracic Aorta: 2.0 cm  Sinus of Valsalva Measurements:  Non-coronary: 28 mm  Right -  coronary: 25.6 mm  Left - coronary: 26.3 mm  Coronary Artery Height above Annulus:  Left Main: 12.8 mm above annulus  Right Coronary: 13.6 mm above annulus  Virtual Basal Annulus Measurements:  Maximum / Minimum Diameter:  25.7 mm x 19.5 mm  Perimeter: 74 mm  Area: 415 mm2  Coronary Arteries: Sufficient height above annulus for deployment  Optimum Fluoroscopic Angle for Delivery: LAO 3 Cranial 2 degrees  IMPRESSION: 1. Tri leaflet AV with annular area of 415 mm2 suitable for a 23 mm Sapien 3 valve  2. Optimum angiographic angle for deployment LAO 3 Cranial 2 degrees  3. Normal aortic root size 2.9 cm? Anomalous arch vessels  4. Coronary arteries suitable height above annulus for deployment  5. No LAA thrombus  Jenkins Rouge  Electronically Signed: By: Jenkins Rouge M.D. On: 02/23/2018 12:25      CLINICAL DATA: 84 year old female with a history of preoperative evaluation for potential trans arterial aortic valve replacement  EXAM: CT ANGIOGRAPHY CHEST, ABDOMEN AND PELVIS  TECHNIQUE: Multidetector CT imaging through the chest, abdomen and pelvis was performed using the standard protocol during bolus administration of intravenous contrast. Multiplanar reconstructed images and MIPs were obtained and reviewed to evaluate the vascular anatomy.  Gated cardiac study is also included, which has been interpreted and build by separate provider, under a separate accession number.  CONTRAST: 43mL ISOVUE-370 IOPAMIDOL (ISOVUE-370) INJECTION 76%  COMPARISON: None.  FINDINGS: CTA CHEST FINDINGS  Cardiovascular:  Heart:  Borderline enlarged heart. No pericardial fluid/thickening. Calcified atherosclerosis of the left anterior descending coronary artery.  Calcifications of the aortic valve and the mitral annulus.  Diameter of the sino-tubular junction 2.5 cm. Greatest diameter of the ascending aorta measures 3.1 cm. Mild atherosclerotic changes of the aortic arch.  Aberrant origin of the right subclavian artery.  There is a common trunk of the right common carotid artery and the left common carotid artery. Separate origin of the left subclavian artery.  No significant atherosclerotic changes at the origin of these vessels.  Bilateral vertebral arteries and the bilateral common carotid artery are patent at the neck base. Dominant left vertebral artery.  Left subclavian artery patent. Left axillary artery patent. No significant stenosis or significant atherosclerotic changes.  Aorta:  Mild atherosclerotic changes of the aortic arch. Proximal descending aorta measures 2.2 cm. No dissection. No periaortic fluid. No aneurysm. Minimal tortuosity.  Greatest diameter just above the aortic hiatus measures 19 mm.  Pulmonary arteries:  Contrast bolus timing is not optimized for evaluation of the pulmonary arteries. Main pulmonary artery measures 2.5 cm.  Mediastinum/Nodes: Circumferential thickening of the length of the thoracic esophagus. Multiple lymph nodes throughout the mediastinum, some of which are calcified, predominantly right-sided. This includes right lowest paratracheal right hilar, and right peer bronchial.  Unremarkable appearance of the thoracic inlet.  Lungs/Pleura: Azygos lobe. Centrilobular emphysema. Calcified granuloma of the right upper lobe on image 37 of series 15. Minimal pattern of centrilobular nodularity at the periphery of the left upper lobe. No confluent airspace disease. No endotracheal or endobronchial debris. No peribronchial thickening. No pleural effusion or pneumothorax.  Musculoskeletal: Surgical changes of bilateral glenohumeral joint arthroplasty. No acute displaced fracture identified. No aggressive sclerotic or lucent bony lesions. Flowing anterior osteophyte production of the thoracic spine. No acute displaced fracture.  Review of the MIP images confirms the above findings.  CTA ABDOMEN AND PELVIS FINDINGS  VASCULAR  Aorta: Minimal atherosclerotic changes of the abdominal aorta. No aneurysm, dissection, stenosis, or periaortic fluid.  Celiac:  Minimal atherosclerotic  changes at the celiac artery origin with no stenosis or occlusion. Branch vessels are patent.  SMA: No significant atherosclerotic changes at the SMA origin. Branches are patent.  Renals: Minimal atherosclerotic changes at the origin the bilateral renal arteries with no high-grade stenosis by CT imaging. Normal course caliber and contour of the bilateral renal arteries. The right renal artery is the lowest.  IMA: IMA is patent.  Right lower extremity:  Relatively normal course caliber and contour of the right iliac arterial system with minimal atherosclerotic change. No high-grade stenosis or occlusion. The smallest diameter of the right common iliac artery estimated 9 mm-10 mm. Hypogastric artery remains patent.  External iliac artery is patent with the diameter approximately 8 mm.  Common femoral artery without significant atherosclerotic changes. Diameter measures 9 mm.  Proximal profunda femoris and SFA patent.  Left lower extremity:  Relatively normal course caliber and contour of the left iliac system without significant tortuosity, atherosclerotic changes, stenosis or occlusion. Hypogastric artery is patent. Smallest estimated diameter of the common iliac artery is 9 mm-10 mm.  Saw list estimated diameter of the external iliac artery 7 mm.  No significant atherosclerotic changes of the common femoral artery, with the diameter estimated 8 mm-9 mm.  Proximal SFA and profunda femoris patent.  Veins: Unremarkable appearance of the venous system.  Review of the MIP images confirms the above findings.  NON-VASCULAR  Hepatobiliary: Unremarkable appearance of the liver. Cholecystectomy  Pancreas: Unremarkable pancreas  Spleen: Unremarkable.  Adrenals/Urinary Tract: Unremarkable appearance of adrenal glands.  Right:  No hydronephrosis. Symmetric perfusion to the left. No nephrolithiasis. Unremarkable course of the right  ureter.  Left:  No hydronephrosis. Symmetric perfusion to the right. No nephrolithiasis. Unremarkable course of the left ureter.  Tiny gas at the anti dependent urinary bladder which is partially distended. No associated inflammation or urinary bladder wall thickening.  Stomach/Bowel: Unremarkable appearance of the stomach. Unremarkable appearance of small bowel. No evidence of obstruction. Appendix is not visualized, however, no inflammatory changes are present adjacent to the cecum to indicate an appendicitis. Colonic diverticula without associated inflammation.  Lymphatic: No adenopathy  Mesenteric: No free fluid or air. No adenopathy.  Reproductive: Unremarkable appearance of uterus and adnexa  Other: Small fat containing umbilical hernia  Musculoskeletal: No acute displaced fracture. Degenerative changes of the lumbar spine. Grade 1 anterolisthesis of L4 on L5. Vacuum disc phenomenon of L5-S1. No significant bony canal narrowing.  IMPRESSION: No acute finding.  **An incidental finding of potential clinical significance has been found. There is long segment circumferential thickening of the thoracic esophagus, of uncertain significance. Differential includes inflammation as well as tumor. Correlation with history of symptoms of esophagitis/GERD, as well as possibly upper endoscopy recommended**  Multiple mediastinal lymph nodes. Etiology uncertain, potentially related to chronic granulomatous disease, chronic inflammation from esophagitis/GER D, myeloproliferative disorder, or, if there is occult esophageal tumor, potentially mediastinal metastases.  Mild atherosclerotic changes of the descending thoracic aorta and the abdominal aorta with no significant tortuosity, high-grade stenosis, or occlusion. Aortic Atherosclerosis (ICD10-I70.0).  Minimal atherosclerotic changes of the bilateral iliac arteries with no significant stenosis, occlusion, or  tortuosity. Smallest diameter on the right in the EIA estimated 8 mm, and on the left in the EIA estimated 7 mm.  A right axillary/brachial trans arterial approach for deployment is precluded by the presence of aberrant origin of the right subclavian.  Minimal pattern of centrilobular nodularity of the left upper lobe, potentially inflammatory/infectious versus chronic changes of granulomatous  disease or MAI. No evidence of lobar pneumonia or edema.  Imaging diagnoses specific to the cardiac structures/left ventricular outflow tract have been performed and billed by a separate provider.  Signed,  Dulcy Fanny. Dellia Nims, RPVI  [facility]  [facility]   Electronically Signed By: Corrie Mckusick D.O. On: 02/23/2018 14:40   STS risk score: Procedure: Isolated AVR   Risk of Mortality: 2.029%  Renal Failure: 1.801%  Permanent Stroke: 1.388%  Prolonged Ventilation: 6.475%  DSW Infection: 0.122%  Reoperation: 2.406%  Morbidity or Mortality: 10.656%  Short Length of Stay: 33.108%  Long Length of Stay: 5.190%   Impression:  This 84 year old woman has stage D, severe, symptomatic aortic stenosis with New York Heart Association class II symptoms of exertional fatigue and shortness of breath consistent with chronic diastolic congestive heart failure.  She is also having some exertional chest discomfort and dizziness.  I have personally reviewed her 2D echocardiogram, cardiac catheterization, and CTA studies.  Her echocardiogram shows a trileaflet aortic valve with calcification of the leaflets and restricted mobility with a mean gradient of 39 mmHg consistent with severe aortic stenosis.  Left ventricular ejection fraction is normal.  There is grade 2 diastolic dysfunction.  Cardiac catheterization shows no significant coronary disease.  The mean gradient across aortic valve is 32 mmHg with a calculated valve area of 0.99  cm.  I agree that aortic valve replacement is indicated in this patient.  Given her age and disability due to degenerative arthritis I think that transcatheter aortic valve replacement would be the best treatment for her.  Her gated cardiac CTA shows anatomy favorable for transcatheter aortic valve replacement using a 23 mm Sapien 3 valve with no complicating features.  Her abdominal and pelvic CTA shows adequate pelvic vascular anatomy to allow transfemoral insertion.  The patient and her son were counseled at length regarding treatment alternatives for management of severe symptomatic aortic stenosis. The risks and benefits of surgical intervention has been discussed in detail. Long-term prognosis with medical therapy was discussed. Alternative approaches such as conventional surgical aortic valve replacement, transcatheter aortic valve replacement, and palliative medical therapy were compared and contrasted at length. This discussion was placed in the context of the patient's own specific clinical presentation and past medical history. All of their questions have been addressed.   Following the decision to proceed with transcatheter aortic valve replacement, a discussion was held regarding what types of management strategies would be attempted intraoperatively in the event of life-threatening complications, including whether or not the patient would be considered a candidate for the use of cardiopulmonary bypass and/or conversion to open sternotomy for attempted surgical intervention. The patient is aware of the fact that transient use of cardiopulmonary bypass may be necessary.  Although she is 84 years old she is still relatively active and independent and is a low surgical risk patient so I think she would be a candidate for sternotomy if needed for emergent correction of any intraoperative complications.  The patient has been advised of a variety of complications that might develop including but not  limited to risks of death, stroke, paravalvular leak, aortic dissection or other major vascular complications, aortic annulus rupture, device embolization, cardiac rupture or perforation, mitral regurgitation, acute myocardial infarction, arrhythmia, heart block or bradycardia requiring permanent pacemaker placement, congestive heart failure, respiratory failure, renal failure, pneumonia, infection, other late complications related to structural valve deterioration or migration, or other complications that might ultimately cause a temporary or permanent loss  of functional independence or other long term morbidity. The patient provides full informed consent for the procedure as described and all questions were answered.     Plan:  Transfemoral transcatheter aortic valve replacement using a Sapien 3 valve.   Gaye Pollack, MD

## 2018-03-02 NOTE — Pre-Procedure Instructions (Signed)
[place]  03/02/2018    Your procedure is scheduled on Tuesday, March 03, 2018 at 2:45 PM.   Report to [facility] Entrance "A" Admitting Office at 12:45 PM.   Call this number if you have problems the morning of surgery: [phone]   Remember:  Do not eat or drink after midnight tonight.  Take these medicines the morning of surgery with A SIP OF WATER: NONE  Do not take your evening dose of Glimepiride (Amaryl) this evening.   How to Manage Your Diabetes Before Surgery   Why is it important to control my blood sugar before and after surgery?   Improving blood sugar levels before and after surgery helps healing and can limit problems.  A way of improving blood sugar control is eating a healthy diet by:  - Eating less sugar and carbohydrates  - Increasing activity/exercise  - Talk with your doctor about reaching your blood sugar goals  High blood sugars (greater than 180 mg/dL) can raise your risk of infections and slow down your recovery so you will need to focus on controlling your diabetes during the weeks before surgery.  Make sure that the doctor who takes care of your diabetes knows about your planned surgery including the date and location.  How do I manage my blood sugars before surgery?   Check your blood sugar at least 4 times a day, 2 days before surgery to make sure that they are not too high or low.  Check your blood sugar the morning of your surgery when you wake up and every 2 hours until you get to the Short-Stay unit.  Treat a low blood sugar (less than 70 mg/dL) with 1/2 cup of clear juice (cranberry or apple), 4 glucose tablets, OR glucose gel.  Recheck blood sugar in 15 minutes after treatment (to make sure it is greater than 70 mg/dL).  If blood sugar is not greater than 70 mg/dL on re-check, call [phone] for further instructions.   Report your blood sugar to the Short-Stay nurse when you get to Short-Stay.  References:   [facility], 2007 "How to Manage your Diabetes Before and After Surgery".     Do not wear jewelry, make-up or nail polish.  Do not wear lotions, powders, perfumes or deodorant.  Do not shave 48 hours prior to surgery.    Do not bring valuables to the hospital.  [facility] is not responsible for any belongings or valuables.  Contacts, dentures or bridgework may not be worn into surgery.  Leave your suitcase in the car.  After surgery it may be brought to your room.  For patients admitted to the hospital, discharge time will be determined by your treatment team.  [facility] - Preparing for Surgery  Before surgery, you can play an important role.  Because skin is not sterile, your skin needs to be as free of germs as possible.  You can reduce the number of germs on you skin by washing with CHG (chlorahexidine gluconate) soap before surgery.  CHG is an antiseptic cleaner which kills germs and bonds with the skin to continue killing germs even after washing.  Oral Hygiene is also important in reducing the risk of infection.  Remember to brush your teeth with your regular toothpaste the morning of surgery.  Please DO NOT use if you have an allergy to CHG or antibacterial soaps.  If your skin becomes reddened/irritated stop using the CHG and inform your nurse  when you arrive at Short Stay.  Do not shave (including legs and underarms) for at least 48 hours prior to the first CHG shower.  You may shave your face.  Please follow these instructions carefully:   1.  Shower with CHG Soap the night before surgery and the morning of Surgery.  2.  If you choose to wash your hair, wash your hair first as usual with your normal shampoo.  3.  After you shampoo, rinse your hair and body thoroughly to remove the shampoo. 4.  Use CHG as you would any other liquid soap.  You can apply chg directly to the skin and wash gently with a      scrungie or washcloth.           5.  Apply  the CHG Soap to your body ONLY FROM THE NECK DOWN.   Do not use on open wounds or open sores. Avoid contact with your eyes, ears, mouth and genitals (private parts).  Wash genitals (private parts) with your normal soap.  6.  Wash thoroughly, paying special attention to the area where your surgery will be performed.  7.  Thoroughly rinse your body with warm water from the neck down.  8.  DO NOT shower/wash with your normal soap after using and rinsing off the CHG Soap.  9.  Pat yourself dry with a clean towel.            10.  Wear clean pajamas.            11.  Place clean sheets on your bed the night of your first shower and do not sleep with pets.  Day of Surgery  Shower as above. Do not apply any lotions/deodorants the morning of surgery.   Please wear clean clothes to the hospital. Remember to brush your teeth with toothpaste.   Please read over the fact sheets that you were given.

## 2018-03-02 NOTE — Progress Notes (Addendum)
PCP - Dr. Ashby Dawes  Cardiologist - Dr. Johnsie Cancel   Chest x-ray - today EKG - today Stress Test - 07/22/16 ECHO - 01/23/18  Cardiac Cath - 01/29/2018  Sleep Study - in [place] in Dr. Garth Bigness notes dated 06/25/17 CPAP - no  Fasting Blood Sugar - 120-130 Checks Blood Sugar _____ times a day  Blood Thinner Instructions: to continue (not take day of surgery) Aspirin Instructions: to continue (not take day of surgery)  Anesthesia review:   Patient denies shortness of breath, fever, cough and chest pain at PAT appointment   Patient verbalized understanding of instructions that were given to them at the PAT appointment. Patient was also instructed that they will need to review over the PAT instructions again at home before surgery.

## 2018-03-03 ENCOUNTER — Inpatient Hospital Stay (HOSPITAL_COMMUNITY)
Admission: RE | Admit: 2018-03-03 | Discharge: 2018-03-05 | DRG: 267 | Disposition: A | Discharge status: Home / Self Care | Payer: Medicare Other | Attending: Cardiovascular Disease | Admitting: Cardiovascular Disease

## 2018-03-03 ENCOUNTER — Other Ambulatory Visit: Payer: Self-pay

## 2018-03-03 ENCOUNTER — Encounter (HOSPITAL_COMMUNITY): Payer: Self-pay | Admitting: *Deleted

## 2018-03-03 ENCOUNTER — Encounter (HOSPITAL_COMMUNITY)
Admission: RE | Disposition: A | Discharge status: Home / Self Care | Payer: Self-pay | Source: Home / Self Care | Attending: Cardiovascular Disease

## 2018-03-03 ENCOUNTER — Inpatient Hospital Stay (HOSPITAL_COMMUNITY): Payer: Medicare Other | Admitting: Anesthesiology

## 2018-03-03 ENCOUNTER — Ambulatory Visit (HOSPITAL_COMMUNITY): Payer: Medicare Other

## 2018-03-03 ENCOUNTER — Other Ambulatory Visit: Payer: Self-pay | Admitting: Physician Assistant

## 2018-03-03 ENCOUNTER — Inpatient Hospital Stay (HOSPITAL_COMMUNITY): Payer: Medicare Other

## 2018-03-03 DIAGNOSIS — M4316 Spondylolisthesis, lumbar region: Secondary | ICD-10-CM | POA: Diagnosis present

## 2018-03-03 DIAGNOSIS — I110 Hypertensive heart disease with heart failure: Secondary | ICD-10-CM | POA: Diagnosis present

## 2018-03-03 DIAGNOSIS — Z9049 Acquired absence of other specified parts of digestive tract: Secondary | ICD-10-CM

## 2018-03-03 DIAGNOSIS — I340 Nonrheumatic mitral (valve) insufficiency: Secondary | ICD-10-CM | POA: Diagnosis not present

## 2018-03-03 DIAGNOSIS — E039 Hypothyroidism, unspecified: Secondary | ICD-10-CM | POA: Diagnosis present

## 2018-03-03 DIAGNOSIS — K219 Gastro-esophageal reflux disease without esophagitis: Secondary | ICD-10-CM | POA: Diagnosis present

## 2018-03-03 DIAGNOSIS — I4510 Unspecified right bundle-branch block: Secondary | ICD-10-CM | POA: Diagnosis present

## 2018-03-03 DIAGNOSIS — Z96611 Presence of right artificial shoulder joint: Secondary | ICD-10-CM | POA: Diagnosis present

## 2018-03-03 DIAGNOSIS — M2578 Osteophyte, vertebrae: Secondary | ICD-10-CM | POA: Diagnosis present

## 2018-03-03 DIAGNOSIS — R509 Fever, unspecified: Secondary | ICD-10-CM | POA: Diagnosis not present

## 2018-03-03 DIAGNOSIS — Z96651 Presence of right artificial knee joint: Secondary | ICD-10-CM | POA: Diagnosis present

## 2018-03-03 DIAGNOSIS — Z9889 Other specified postprocedural states: Secondary | ICD-10-CM | POA: Diagnosis not present

## 2018-03-03 DIAGNOSIS — Z79890 Hormone replacement therapy (postmenopausal): Secondary | ICD-10-CM

## 2018-03-03 DIAGNOSIS — M1990 Unspecified osteoarthritis, unspecified site: Secondary | ICD-10-CM | POA: Diagnosis present

## 2018-03-03 DIAGNOSIS — R5082 Postprocedural fever: Secondary | ICD-10-CM | POA: Diagnosis not present

## 2018-03-03 DIAGNOSIS — J432 Centrilobular emphysema: Secondary | ICD-10-CM | POA: Diagnosis not present

## 2018-03-03 DIAGNOSIS — I080 Rheumatic disorders of both mitral and aortic valves: Principal | ICD-10-CM | POA: Diagnosis present

## 2018-03-03 DIAGNOSIS — I350 Nonrheumatic aortic (valve) stenosis: Secondary | ICD-10-CM

## 2018-03-03 DIAGNOSIS — Z952 Presence of prosthetic heart valve: Secondary | ICD-10-CM

## 2018-03-03 DIAGNOSIS — Z96612 Presence of left artificial shoulder joint: Secondary | ICD-10-CM | POA: Diagnosis present

## 2018-03-03 DIAGNOSIS — E118 Type 2 diabetes mellitus with unspecified complications: Secondary | ICD-10-CM | POA: Diagnosis not present

## 2018-03-03 DIAGNOSIS — I10 Essential (primary) hypertension: Secondary | ICD-10-CM | POA: Diagnosis present

## 2018-03-03 DIAGNOSIS — Z9841 Cataract extraction status, right eye: Secondary | ICD-10-CM

## 2018-03-03 DIAGNOSIS — Z8249 Family history of ischemic heart disease and other diseases of the circulatory system: Secondary | ICD-10-CM

## 2018-03-03 DIAGNOSIS — Z006 Encounter for examination for normal comparison and control in clinical research program: Secondary | ICD-10-CM

## 2018-03-03 DIAGNOSIS — R2681 Unsteadiness on feet: Secondary | ICD-10-CM | POA: Diagnosis present

## 2018-03-03 DIAGNOSIS — Z85820 Personal history of malignant melanoma of skin: Secondary | ICD-10-CM

## 2018-03-03 DIAGNOSIS — H903 Sensorineural hearing loss, bilateral: Secondary | ICD-10-CM | POA: Diagnosis present

## 2018-03-03 DIAGNOSIS — G4733 Obstructive sleep apnea (adult) (pediatric): Secondary | ICD-10-CM | POA: Diagnosis present

## 2018-03-03 DIAGNOSIS — I2510 Atherosclerotic heart disease of native coronary artery without angina pectoris: Secondary | ICD-10-CM | POA: Diagnosis present

## 2018-03-03 DIAGNOSIS — I5032 Chronic diastolic (congestive) heart failure: Secondary | ICD-10-CM | POA: Diagnosis not present

## 2018-03-03 DIAGNOSIS — I700 Atherosclerosis of aorta: Secondary | ICD-10-CM | POA: Diagnosis present

## 2018-03-03 DIAGNOSIS — G8929 Other chronic pain: Secondary | ICD-10-CM | POA: Diagnosis present

## 2018-03-03 DIAGNOSIS — E119 Type 2 diabetes mellitus without complications: Secondary | ICD-10-CM | POA: Diagnosis not present

## 2018-03-03 DIAGNOSIS — Z452 Encounter for adjustment and management of vascular access device: Secondary | ICD-10-CM | POA: Diagnosis not present

## 2018-03-03 DIAGNOSIS — Z9842 Cataract extraction status, left eye: Secondary | ICD-10-CM

## 2018-03-03 DIAGNOSIS — K449 Diaphragmatic hernia without obstruction or gangrene: Secondary | ICD-10-CM | POA: Diagnosis present

## 2018-03-03 DIAGNOSIS — Z7984 Long term (current) use of oral hypoglycemic drugs: Secondary | ICD-10-CM

## 2018-03-03 DIAGNOSIS — Z79899 Other long term (current) drug therapy: Secondary | ICD-10-CM

## 2018-03-03 DIAGNOSIS — Z7902 Long term (current) use of antithrombotics/antiplatelets: Secondary | ICD-10-CM

## 2018-03-03 HISTORY — PX: TRANSCATHETER AORTIC VALVE REPLACEMENT, TRANSFEMORAL: SHX6400

## 2018-03-03 HISTORY — PX: TEE WITHOUT CARDIOVERSION: SHX5443

## 2018-03-03 HISTORY — DX: Presence of prosthetic heart valve: Z95.2

## 2018-03-03 LAB — POCT I-STAT 4, (NA,K, GLUC, HGB,HCT)
Glucose, Bld: 120 mg/dL — ABNORMAL HIGH (ref 70–99)
Glucose, Bld: 152 mg/dL — ABNORMAL HIGH (ref 70–99)
Glucose, Bld: 105 mg/dL — ABNORMAL HIGH (ref 70–99)
HCT: 32 % — ABNORMAL LOW (ref 36.0–46.0)
HCT: 31 % — ABNORMAL LOW (ref 36.0–46.0)
HCT: 38 % (ref 36.0–46.0)
HEMOGLOBIN: 12.9 g/dL (ref 12.0–15.0)
Hemoglobin: 10.9 g/dL — ABNORMAL LOW (ref 12.0–15.0)
Hemoglobin: 10.5 g/dL — ABNORMAL LOW (ref 12.0–15.0)
POTASSIUM: 3.5 mmol/L (ref 3.5–5.1)
Potassium: 3.5 mmol/L (ref 3.5–5.1)
Potassium: 3.5 mmol/L (ref 3.5–5.1)
SODIUM: 142 mmol/L (ref 135–145)
Sodium: 142 mmol/L (ref 135–145)
Sodium: 136 mmol/L (ref 135–145)

## 2018-03-03 LAB — POCT I-STAT 7, (LYTES, BLD GAS, ICA,H+H)
Acid-base deficit: 4 mmol/L — ABNORMAL HIGH (ref 0.0–2.0)
BICARBONATE: 22.3 mmol/L (ref 20.0–28.0)
Calcium, Ion: 1.23 mmol/L (ref 1.15–1.40)
HCT: 33 % — ABNORMAL LOW (ref 36.0–46.0)
Hemoglobin: 11.2 g/dL — ABNORMAL LOW (ref 12.0–15.0)
O2 Saturation: 94 %
PCO2 ART: 42.5 mmHg (ref 32.0–48.0)
Potassium: 3.4 mmol/L — ABNORMAL LOW (ref 3.5–5.1)
Sodium: 141 mmol/L (ref 135–145)
TCO2: 24 mmol/L (ref 22–32)
pH, Arterial: 7.328 — ABNORMAL LOW (ref 7.350–7.450)
pO2, Arterial: 75 mmHg — ABNORMAL LOW (ref 83.0–108.0)

## 2018-03-03 LAB — POCT ACTIVATED CLOTTING TIME
Activated Clotting Time: 132 seconds
Activated Clotting Time: 115 seconds
Activated Clotting Time: 260 seconds

## 2018-03-03 LAB — POCT I-STAT CREATININE: Creatinine, Ser: 0.7 mg/dL (ref 0.44–1.00)

## 2018-03-03 LAB — GLUCOSE, CAPILLARY
GLUCOSE-CAPILLARY: 112 mg/dL — AB (ref 70–99)
Glucose-Capillary: 123 mg/dL — ABNORMAL HIGH (ref 70–99)
Glucose-Capillary: 149 mg/dL — ABNORMAL HIGH (ref 70–99)
Glucose-Capillary: 112 mg/dL — ABNORMAL HIGH (ref 70–99)
Glucose-Capillary: 203 mg/dL — ABNORMAL HIGH (ref 70–99)

## 2018-03-03 SURGERY — IMPLANTATION, AORTIC VALVE, TRANSCATHETER, FEMORAL APPROACH
Anesthesia: Monitor Anesthesia Care | Site: Chest

## 2018-03-03 MED ORDER — INSULIN ASPART 100 UNIT/ML ~~LOC~~ SOLN
0.0000 [IU] | Freq: Three times a day (TID) | SUBCUTANEOUS | Status: DC
  Administered 2018-03-03: 8 [IU] via SUBCUTANEOUS
  Administered 2018-03-04: 2 [IU] via SUBCUTANEOUS
  Administered 2018-03-04: 4 [IU] via SUBCUTANEOUS
  Administered 2018-03-05: 2 [IU] via SUBCUTANEOUS

## 2018-03-03 MED ORDER — LIDOCAINE HCL (PF) 1 % IJ SOLN
INTRAMUSCULAR | Status: AC
  Filled 2018-03-03: qty 30

## 2018-03-03 MED ORDER — CHLORHEXIDINE GLUCONATE 4 % EX LIQD: 60.0000 mL | Freq: Once | CUTANEOUS | Status: DC

## 2018-03-03 MED ORDER — CHLORHEXIDINE GLUCONATE 4 % EX LIQD: 30.0000 mL | CUTANEOUS | Status: DC

## 2018-03-03 MED ORDER — HEPARIN (PORCINE) IN NACL 1000-0.9 UT/500ML-% IV SOLN
INTRAVENOUS | Status: AC
  Filled 2018-03-03: qty 1500

## 2018-03-03 MED ORDER — VANCOMYCIN HCL IN DEXTROSE 1-5 GM/200ML-% IV SOLN
1000.0000 mg | Freq: Once | INTRAVENOUS | Status: AC
  Administered 2018-03-04: 1000 mg via INTRAVENOUS
  Filled 2018-03-03: qty 200

## 2018-03-03 MED ORDER — MIRABEGRON ER 50 MG PO TB24
50.0000 mg | ORAL_TABLET | Freq: Every day | ORAL | Status: DC
  Administered 2018-03-04 – 2018-03-05 (×2): 50 mg via ORAL
  Filled 2018-03-03 (×3): qty 1

## 2018-03-03 MED ORDER — ECONAZOLE NITRATE 1 % EX CREA: 1.0000 "application " | TOPICAL_CREAM | Freq: Every day | CUTANEOUS | Status: DC

## 2018-03-03 MED ORDER — OMEGA-3-ACID ETHYL ESTERS 1 G PO CAPS
1.0000 g | ORAL_CAPSULE | Freq: Every day | ORAL | Status: DC
  Administered 2018-03-04 – 2018-03-05 (×2): 1 g via ORAL
  Filled 2018-03-03 (×2): qty 1

## 2018-03-03 MED ORDER — SODIUM CHLORIDE 0.9 % IV SOLN: 250.0000 mL | INTRAVENOUS | Status: DC | PRN

## 2018-03-03 MED ORDER — LIDOCAINE HCL (PF) 1 % IJ SOLN
INTRAMUSCULAR | Status: DC | PRN
  Administered 2018-03-03 (×2): 15 mL via INTRADERMAL

## 2018-03-03 MED ORDER — HYDROCORTISONE 1 % EX CREA
1.0000 "application " | TOPICAL_CREAM | Freq: Every day | CUTANEOUS | Status: DC | PRN
  Filled 2018-03-03: qty 28

## 2018-03-03 MED ORDER — GABAPENTIN 100 MG PO CAPS
100.0000 mg | ORAL_CAPSULE | Freq: Every day | ORAL | Status: DC
  Administered 2018-03-03 – 2018-03-04 (×2): 100 mg via ORAL
  Filled 2018-03-03 (×2): qty 1

## 2018-03-03 MED ORDER — SODIUM CHLORIDE 0.9 % IV SOLN
INTRAVENOUS | Status: DC
  Administered 2018-03-03 (×2): via INTRAVENOUS

## 2018-03-03 MED ORDER — LACTATED RINGERS IV SOLN
INTRAVENOUS | Status: DC | PRN
  Administered 2018-03-03 (×2): via INTRAVENOUS

## 2018-03-03 MED ORDER — SODIUM CHLORIDE 0.9 % IV SOLN
1.5000 g | Freq: Two times a day (BID) | INTRAVENOUS | Status: DC
  Administered 2018-03-04 – 2018-03-05 (×3): 1.5 g via INTRAVENOUS
  Filled 2018-03-03 (×4): qty 1.5

## 2018-03-03 MED ORDER — SODIUM CHLORIDE 0.9% FLUSH: 3.0000 mL | INTRAVENOUS | Status: DC | PRN

## 2018-03-03 MED ORDER — NOREPINEPHRINE BITARTRATE 1 MG/ML IV SOLN
INTRAVENOUS | Status: DC | PRN
  Administered 2018-03-03: 2 ug/min via INTRAVENOUS

## 2018-03-03 MED ORDER — CHLORHEXIDINE GLUCONATE 0.12 % MT SOLN: 15.0000 mL | Freq: Once | OROMUCOSAL | Status: DC

## 2018-03-03 MED ORDER — IOPAMIDOL (ISOVUE-370) INJECTION 76%
INTRAVENOUS | Status: AC
  Filled 2018-03-03: qty 125

## 2018-03-03 MED ORDER — MIDAZOLAM HCL 5 MG/5ML IJ SOLN
INTRAMUSCULAR | Status: DC | PRN
  Administered 2018-03-03: 2 mg via INTRAVENOUS

## 2018-03-03 MED ORDER — TRAMADOL HCL 50 MG PO TABS: 50.0000 mg | ORAL_TABLET | ORAL | Status: DC | PRN

## 2018-03-03 MED ORDER — SODIUM CHLORIDE 0.9 % IV SOLN
INTRAVENOUS | Status: AC
  Administered 2018-03-03: 15:00:00 via INTRAVENOUS

## 2018-03-03 MED ORDER — LEVOTHYROXINE SODIUM 25 MCG PO TABS
125.0000 ug | ORAL_TABLET | Freq: Every day | ORAL | Status: DC
  Administered 2018-03-04 – 2018-03-05 (×2): 125 ug via ORAL
  Filled 2018-03-03 (×2): qty 1

## 2018-03-03 MED ORDER — NITROGLYCERIN IN D5W 200-5 MCG/ML-% IV SOLN: 0.0000 ug/min | INTRAVENOUS | Status: DC

## 2018-03-03 MED ORDER — PROPOFOL 500 MG/50ML IV EMUL
INTRAVENOUS | Status: DC | PRN
  Administered 2018-03-03: 60 ug/kg/min via INTRAVENOUS

## 2018-03-03 MED ORDER — ONDANSETRON HCL 4 MG/2ML IJ SOLN
INTRAMUSCULAR | Status: DC | PRN
  Administered 2018-03-03: 4 mg via INTRAVENOUS

## 2018-03-03 MED ORDER — SODIUM CHLORIDE 0.9% FLUSH
3.0000 mL | Freq: Two times a day (BID) | INTRAVENOUS | Status: DC
  Administered 2018-03-03 – 2018-03-05 (×3): 3 mL via INTRAVENOUS

## 2018-03-03 MED ORDER — ACETAMINOPHEN 650 MG RE SUPP: 650.0000 mg | Freq: Four times a day (QID) | RECTAL | Status: DC | PRN

## 2018-03-03 MED ORDER — PROTAMINE SULFATE 10 MG/ML IV SOLN
INTRAVENOUS | Status: DC | PRN
  Administered 2018-03-03: 10 mg via INTRAVENOUS
  Administered 2018-03-03: 50 mg via INTRAVENOUS
  Administered 2018-03-03: 70 mg via INTRAVENOUS

## 2018-03-03 MED ORDER — ACETAMINOPHEN 325 MG PO TABS
650.0000 mg | ORAL_TABLET | Freq: Four times a day (QID) | ORAL | Status: DC | PRN
  Administered 2018-03-04 – 2018-03-05 (×4): 650 mg via ORAL
  Filled 2018-03-03 (×4): qty 2

## 2018-03-03 MED ORDER — PHENYLEPHRINE HCL-NACL 20-0.9 MG/250ML-% IV SOLN
0.0000 ug/min | INTRAVENOUS | Status: DC
  Filled 2018-03-03: qty 250

## 2018-03-03 MED ORDER — HEPARIN (PORCINE) IN NACL 1000-0.9 UT/500ML-% IV SOLN
INTRAVENOUS | Status: DC | PRN
  Administered 2018-03-03 (×3): 500 mL

## 2018-03-03 MED ORDER — ROSUVASTATIN CALCIUM 5 MG PO TABS
5.0000 mg | ORAL_TABLET | Freq: Every day | ORAL | Status: DC
  Administered 2018-03-04: 5 mg via ORAL
  Filled 2018-03-03: qty 1

## 2018-03-03 MED ORDER — HEPARIN SODIUM (PORCINE) 1000 UNIT/ML IJ SOLN
INTRAMUSCULAR | Status: DC | PRN
  Administered 2018-03-03: 13000 [IU] via INTRAVENOUS

## 2018-03-03 MED ORDER — OXYCODONE HCL 5 MG PO TABS: 5.0000 mg | ORAL_TABLET | ORAL | Status: DC | PRN

## 2018-03-03 MED ORDER — ASPIRIN 81 MG PO CHEW
81.0000 mg | CHEWABLE_TABLET | Freq: Every day | ORAL | Status: DC
  Administered 2018-03-04 – 2018-03-05 (×2): 81 mg via ORAL
  Filled 2018-03-03 (×2): qty 1

## 2018-03-03 MED ORDER — MIDAZOLAM HCL 2 MG/2ML IJ SOLN
INTRAMUSCULAR | Status: AC
  Filled 2018-03-03: qty 2

## 2018-03-03 MED ORDER — CLOPIDOGREL BISULFATE 75 MG PO TABS
75.0000 mg | ORAL_TABLET | Freq: Every day | ORAL | Status: DC
  Administered 2018-03-04 – 2018-03-05 (×2): 75 mg via ORAL
  Filled 2018-03-03 (×2): qty 1

## 2018-03-03 MED ORDER — FENTANYL CITRATE (PF) 250 MCG/5ML IJ SOLN
INTRAMUSCULAR | Status: DC | PRN
  Administered 2018-03-03: 25 ug via INTRAVENOUS

## 2018-03-03 MED ORDER — METOPROLOL TARTRATE 5 MG/5ML IV SOLN: 2.5000 mg | INTRAVENOUS | Status: DC | PRN

## 2018-03-03 MED ORDER — PHENYLEPHRINE 40 MCG/ML (10ML) SYRINGE FOR IV PUSH (FOR BLOOD PRESSURE SUPPORT)
PREFILLED_SYRINGE | INTRAVENOUS | Status: DC | PRN
  Administered 2018-03-03: 20 ug via INTRAVENOUS
  Administered 2018-03-03 (×3): 40 ug via INTRAVENOUS
  Administered 2018-03-03: 80 ug via INTRAVENOUS
  Administered 2018-03-03: 40 ug via INTRAVENOUS
  Administered 2018-03-03: 80 ug via INTRAVENOUS
  Administered 2018-03-03: 20 ug via INTRAVENOUS

## 2018-03-03 MED ORDER — ONDANSETRON HCL 4 MG/2ML IJ SOLN: 4.0000 mg | Freq: Four times a day (QID) | INTRAMUSCULAR | Status: DC | PRN

## 2018-03-03 MED ORDER — MORPHINE SULFATE (PF) 2 MG/ML IV SOLN: 1.0000 mg | INTRAVENOUS | Status: DC | PRN

## 2018-03-03 MED ORDER — FENTANYL CITRATE (PF) 100 MCG/2ML IJ SOLN
INTRAMUSCULAR | Status: AC
  Administered 2018-03-03: 50 ug
  Filled 2018-03-03: qty 2

## 2018-03-03 MED ORDER — IOPAMIDOL (ISOVUE-370) INJECTION 76%
INTRAVENOUS | Status: DC | PRN
  Administered 2018-03-03: 70 mL via INTRA_ARTERIAL

## 2018-03-03 SURGICAL SUPPLY — 29 items
CABLE ADAPT PACING TEMP 12FT (ADAPTER) ×2 IMPLANT
CATH 23 EDWARDS DELIVERY SYS (CATHETERS) ×2 IMPLANT
CATH DIAG 6FR PIGTAIL ANGLED (CATHETERS) ×4 IMPLANT
CATH INFINITI 6F AL1 (CATHETERS) ×2 IMPLANT
CATH S G BIP PACING (CATHETERS) ×2 IMPLANT
CLOSURE MYNX CONTROL 6F/7F (Vascular Products) ×2 IMPLANT
CRIMPER (MISCELLANEOUS) ×2 IMPLANT
DEVICE CLOSURE PERCLS PRGLD 6F (VASCULAR PRODUCTS) IMPLANT
DEVICE INFLATION ATRION QL2530 (MISCELLANEOUS) ×2 IMPLANT
ELECT DEFIB PAD ADLT CADENCE (PAD) ×2 IMPLANT
GUIDEWIRE SAFE TJ AMPLATZ EXST (WIRE) ×2 IMPLANT
KIT HEART LEFT (KITS) ×4 IMPLANT
KIT MICROPUNCTURE NIT STIFF (SHEATH) ×2 IMPLANT
PERCLOSE PROGLIDE 6F (VASCULAR PRODUCTS) ×8
SHEATH 14X36 EDWARDS (SHEATH) ×2 IMPLANT
SHEATH BRITE TIP 6FR 35CM (SHEATH) ×2 IMPLANT
SHEATH PINNACLE 6F 10CM (SHEATH) ×2 IMPLANT
SHEATH PINNACLE 8F 10CM (SHEATH) ×2 IMPLANT
SHEATH PROBE COVER 6X72 (BAG) ×4 IMPLANT
SLEEVE REPOSITIONING LENGTH 30 (MISCELLANEOUS) ×2 IMPLANT
STOPCOCK MORSE 400PSI 3WAY (MISCELLANEOUS) ×4 IMPLANT
SYR MEDRAD MARK V 150ML (SYRINGE) ×2 IMPLANT
TRANSDUCER W/STOPCOCK (MISCELLANEOUS) ×4 IMPLANT
TUBE CONN 8.8X1320 FR HP M-F (CONNECTOR) ×2 IMPLANT
VALVE HEART TRANSCATH SZ3 23MM (Valve) ×2 IMPLANT
WIRE AMPLATZ SS-J .035X180CM (WIRE) ×2 IMPLANT
WIRE EMERALD 3MM-J .035X150CM (WIRE) ×2 IMPLANT
WIRE EMERALD 3MM-J .035X260CM (WIRE) ×2 IMPLANT
WIRE EMERALD ST .035X260CM (WIRE) ×2 IMPLANT

## 2018-03-03 NOTE — Anesthesia Preprocedure Evaluation (Signed)
Anesthesia Evaluation  Patient identified by MRN, date of birth, ID band Patient awake    Reviewed: Allergy & Precautions, NPO status , Patient's Chart, lab work & pertinent test results  Airway Mallampati: II  TM Distance: >3 FB Neck ROM: Full    Dental  (+) Dental Advisory Given, Lower Dentures, Upper Dentures   Pulmonary sleep apnea ,    Pulmonary exam normal breath sounds clear to auscultation       Cardiovascular hypertension, Pt. on medications + Valvular Problems/Murmurs AS  Rhythm:Regular Rate:Normal + Systolic murmurs    Neuro/Psych  Headaches, negative psych ROS   GI/Hepatic Neg liver ROS, hiatal hernia, GERD  ,  Endo/Other  diabetes, Type 2, Oral Hypoglycemic AgentsHypothyroidism   Renal/GU negative Renal ROS     Musculoskeletal  (+) Arthritis ,   Abdominal   Peds  Hematology negative hematology ROS (+)   Anesthesia Other Findings Day of surgery medications reviewed with the patient.  Reproductive/Obstetrics                             Anesthesia Physical Anesthesia Plan  ASA: IV  Anesthesia Plan: MAC   Post-op Pain Management:    Induction: Intravenous  PONV Risk Score and Plan: 2 and Propofol infusion and Treatment may vary due to age or medical condition  Airway Management Planned: Nasal Cannula and Natural Airway  Additional Equipment:   Intra-op Plan:   Post-operative Plan:   Informed Consent: I have reviewed the patients History and Physical, chart, labs and discussed the procedure including the risks, benefits and alternatives for the proposed anesthesia with the patient or authorized representative who has indicated his/her understanding and acceptance.     Dental advisory given  Plan Discussed with: CRNA and Anesthesiologist  Anesthesia Plan Comments:         Anesthesia Quick Evaluation

## 2018-03-03 NOTE — Anesthesia Procedure Notes (Signed)
Central Venous Catheter Insertion Performed by: Catalina Gravel, MD, anesthesiologist Start/End2/04/2018 11:30 AM, 03/03/2018 11:40 AM Patient location: Pre-op. Preanesthetic checklist: patient identified, IV checked, site marked, risks and benefits discussed, surgical consent, monitors and equipment checked, pre-op evaluation, timeout performed and anesthesia consent Position: Trendelenburg Lidocaine 1% used for infiltration and patient sedated Hand hygiene performed , maximum sterile barriers used  and Seldinger technique used Catheter size: 8 Fr Total catheter length 16. Central line was placed.Double lumen Procedure performed using ultrasound guided technique. Ultrasound Notes:anatomy identified, needle tip was noted to be adjacent to the nerve/plexus identified, no ultrasound evidence of intravascular and/or intraneural injection and image(s) printed for medical record Attempts: 1 Following insertion, dressing applied, line sutured and Biopatch.   Post procedure assessment: blood return through all ports, no air and free fluid flow  Patient tolerated the procedure well with no immediate complications.

## 2018-03-03 NOTE — Interval H&P Note (Signed)
History and Physical Interval Note:  03/03/2018 1:08 PM  Megan [name]  has presented today for surgery, with the diagnosis of Severe Aortic Stenosis  The various methods of treatment have been discussed with the patient and family. After consideration of risks, benefits and other options for treatment, the patient has consented to  Procedure(s): TRANSCATHETER AORTIC VALVE REPLACEMENT, TRANSFEMORAL (N/A) TRANSESOPHAGEAL ECHOCARDIOGRAM (TEE) (N/A) as a surgical intervention .  The patient's history has been reviewed, patient examined, no change in status, stable for surgery.  I have reviewed the patient's chart and labs.  Questions were answered to the patient's satisfaction.     Gaye Pollack

## 2018-03-03 NOTE — Progress Notes (Signed)
Echocardiogram 2D Echocardiogram has been performed.  Megan [name] 03/03/2018, 2:32 PM

## 2018-03-03 NOTE — Op Note (Signed)
[facility]   MULTIDISCIPLINARY HEART VALVE TEAM   TAVR OPERATIVE NOTE   Date of Procedure:  03/03/2018  Preoperative Diagnosis: Severe Aortic Stenosis   Postoperative Diagnosis: Same   Procedure:    Transcatheter Aortic Valve Replacement - Percutaneous  Transfemoral Approach  Edwards Sapien 3 THV (size 23 mm, model # 9600TFX, serial # [id number])   Co-Surgeons:  Gaye Pollack, MD and Sherren Mocha, MD  Anesthesiologist:  Hoy Morn, MD  Echocardiographer:  same  Pre-operative Echo Findings:  Severe aortic stenosis  Normal left ventricular systolic function  Post-operative Echo Findings:  No paravalvular leak  Normal left ventricular systolic function  BRIEF CLINICAL NOTE AND INDICATIONS FOR SURGERY  The patient is an 84 year old woman with diabetes and hypertension who has developed severe symptomatic aortic stenosis.  She also has advanced osteoarthritis and has undergone right knee replacement and bilateral shoulder replacements in the past.  Serial echo studies have demonstrated progressive and now severe aortic stenosis with peak and mean transvalvular gradients of 67 and 39 mmHg, dimensionless index of 0.24, and a calculated aortic valve area of 0.68 cm.  Diagnostic heart catheterization demonstrated no evidence of obstructive coronary artery disease.  Preoperative CTA studies demonstrated suitable anatomy for transfemoral TAVR.  The patient has New York Heart Association functional class II symptoms of chronic diastolic heart failure.  During the course of the patient's preoperative work up they have been evaluated comprehensively by a multidisciplinary team of specialists coordinated through the [facility] in the [facility].  They have been demonstrated to suffer from symptomatic severe aortic stenosis as noted above. The patient has been counseled extensively as to the relative risks and benefits  of all options for the treatment of severe aortic stenosis including long term medical therapy, conventional surgery for aortic valve replacement, and transcatheter aortic valve replacement.  Based upon review of all of the patient's preoperative diagnostic tests they are felt to be candidate for transcatheter aortic valve replacement using the transfemoral approach as an alternative to conventional surgery.    Following the decision to proceed with transcatheter aortic valve replacement, a discussion has been held regarding what types of management strategies would be attempted intraoperatively in the event of life-threatening complications, including whether or not the patient would be considered a candidate for the use of cardiopulmonary bypass and/or conversion to open sternotomy for attempted surgical intervention.  The patient has been advised of a variety of complications that might develop peculiar to this approach including but not limited to risks of death, stroke, paravalvular leak, aortic dissection or other major vascular complications, aortic annulus rupture, device embolization, cardiac rupture or perforation, acute myocardial infarction, arrhythmia, heart block or bradycardia requiring permanent pacemaker placement, congestive heart failure, respiratory failure, renal failure, pneumonia, infection, other late complications related to structural valve deterioration or migration, or other complications that might ultimately cause a temporary or permanent loss of functional independence or other long term morbidity.  The patient provides full informed consent for the procedure as described and all questions were answered preoperatively.  DETAILS OF THE OPERATIVE PROCEDURE  PREPARATION:   The patient is brought to the operating room on the above mentioned date and central monitoring was established by the anesthesia team including placement of a central venous catheter and radial arterial line.  The patient is placed in the supine position on the operating table.  Intravenous antibiotics are administered. The patient is monitored closely throughout the procedure under  conscious sedation.  Baseline transthoracic echocardiogram is performed. The patient's chest, abdomen, both groins, and both lower extremities are prepared and draped in a sterile manner. A time out procedure is performed.   PERIPHERAL ACCESS:   Using ultrasound guidance, femoral arterial access is obtained with placement of a 6 Fr sheath on the left side.  A pigtail diagnostic catheter was passed through the femoral arterial sheath under fluoroscopic guidance into the aortic root.  6 French venous access is obtained on the right side with a 6 French sheath under direct ultrasound guidance as well.  A temporary transvenous pacemaker catheter was passed through the femoral venous sheath under fluoroscopic guidance into the right ventricle.  The pacemaker was tested to ensure stable lead placement and pacemaker capture. Aortic root angiography was performed in order to determine the optimal angiographic angle for valve deployment.  TRANSFEMORAL ACCESS:  A micropuncture technique is used to access the right femoral artery under fluoroscopic and ultrasound guidance.  2 Perclose devices are deployed at 10' and 2' positions to 'PreClose' the femoral artery. An 8 French sheath is placed and then an Amplatz Superstiff wire is advanced through the sheath. This is changed out for a 14 French transfemoral E-Sheath after progressively dilating over the Superstiff wire.  An AL-1 catheter was used to direct a straight-tip exchange length wire across the native aortic valve into the left ventricle. This was exchanged out for a pigtail catheter and position was confirmed in the LV apex. Simultaneous LV and Ao pressures were recorded.  The pigtail catheter was exchanged for an Amplatz Extra-stiff wire in the LV apex.    BALLOON AORTIC VALVULOPLASTY:   Not performed  TRANSCATHETER HEART VALVE DEPLOYMENT:  An Edwards Sapien 3 transcatheter heart valve (size 23 mm, model #9600TFX, serial #[id number]) was prepared and crimped per manufacturer's guidelines, and the proper orientation of the valve is confirmed on the [employer] delivery system. The valve was advanced through the introducer sheath using normal technique until in an appropriate position in the abdominal aorta beyond the sheath tip. The balloon was then retracted and using the fine-tuning wheel was centered on the valve. The valve was then advanced across the aortic arch using appropriate flexion of the catheter. The valve was carefully positioned across the aortic valve annulus. The Commander catheter was retracted using normal technique. Once final position of the valve has been confirmed by angiographic assessment, the valve is deployed while temporarily holding ventilation and during rapid ventricular pacing to maintain systolic blood pressure < 50 mmHg and pulse pressure < 10 mmHg. The balloon inflation is held for >3 seconds after reaching full deployment volume. Once the balloon has fully deflated the balloon is retracted into the ascending aorta and valve function is assessed using echocardiography. There is felt to be no paravalvular leak and no central aortic insufficiency.  The patient's hemodynamic recovery following valve deployment is good.  The deployment balloon and guidewire are both removed. Echo demostrated acceptable post-procedural gradients, stable mitral valve function, and no aortic insufficiency.   PROCEDURE COMPLETION:  The sheath was removed and femoral artery closure is performed using the 2 previously deployed Perclose devices.  Protamine is administered once femoral arterial repair was complete. The site is clear with no evidence of bleeding or hematoma after the sutures are tightened. The temporary pacemaker, pigtail catheters and femoral sheaths were removed  with manual pressure used for hemostasis.   The patient tolerated the procedure well and is transported to the surgical intensive care  in stable condition. There were no immediate intraoperative complications. All sponge instrument and needle counts are verified correct at completion of the operation.   The patient received a total of 40 mL of intravenous contrast during the procedure.  Sherren Mocha, MD 03/03/2018 4:03 PM

## 2018-03-03 NOTE — Progress Notes (Signed)
  [facility] VALVE TEAM  Patient doing well s/p TAVR. She is hemodynamically stable. Groin sites stable; R groin site with some oozing but no hematoma. ECG with sinus and old RBBB; no HAVB. Plan to DC arterial line and transfer  to 4E. Plan for early ambulation after bedrest completed and hopeful discharge over the next 24-48 hours.   Angelena Form PA-C  [facility]  Pager [phone]

## 2018-03-03 NOTE — Op Note (Signed)
[facility]   MULTIDISCIPLINARY HEART VALVE TEAM   TAVR OPERATIVE NOTE   Date of Procedure:  03/03/2018  Preoperative Diagnosis: Severe Aortic Stenosis   Postoperative Diagnosis: Same   Procedure:    Transcatheter Aortic Valve Replacement - Percutaneous Right Transfemoral Approach  Edwards Sapien 3 THV (size 23 mm, model # 9600TFX, serial # [id number])   Co-Surgeons:  Gaye Pollack, MD and Sherren Mocha, MD   Anesthesiologist:  Hoy Morn, MD  Echocardiographer:  Liane Comber, MD  Pre-operative Echo Findings:  Severe aortic stenosis  Normal left ventricular systolic function  Post-operative Echo Findings:  No paravalvular leak  Normal left ventricular systolic function   BRIEF CLINICAL NOTE AND INDICATIONS FOR SURGERY  This 84 year old woman has stage D, severe, symptomatic aortic stenosis with New York Heart Association class II symptoms of exertional fatigue and shortness of breath consistent with chronic diastolic congestive heart failure. She is also having some exertional chest discomfort and dizziness. I have personally reviewed her 2D echocardiogram, cardiac catheterization, and CTA studies. Her echocardiogram shows a trileaflet aortic valve with calcification of the leaflets and restricted mobility with a mean gradient of 39 mmHg consistent with severe aortic stenosis. Left ventricular ejection fraction is normal. There is grade 2 diastolic dysfunction. Cardiac catheterization shows no significant coronary disease. The mean gradient across aortic valve is 32 mmHg with a calculated valve area of 0.99 cm. I agree that aortic valve replacement is indicated in this patient. Given her age and disability due to degenerative arthritis I think that transcatheter aortic valve replacement would be the best treatment for her. Her gated cardiac CTA shows anatomy favorable for transcatheter aortic valve replacement using a 23 mm Sapien 3 valvewith no  complicating features. Her abdominal and pelvic CTA shows adequate pelvic vascular anatomy to allow transfemoral insertion.  The patientand her son werecounseled at length regarding treatment alternatives for management of severe symptomatic aortic stenosis. The risks and benefits of surgical intervention has been discussed in detail. Long-term prognosis with medical therapy was discussed. Alternative approaches such as conventional surgical aortic valve replacement, transcatheter aortic valve replacement, and palliative medical therapy were compared and contrasted at length. This discussion was placed in the context of the patient's own specific clinical presentation and past medical history. All of their questions havebeen addressed.   Following the decision to proceed with transcatheter aortic valve replacement, a discussion was held regarding what types of management strategies would be attempted intraoperatively in the event of life-threatening complications, including whether or not the patient would be considered a candidate for the use of cardiopulmonary bypass and/or conversion to open sternotomy for attempted surgical intervention. The patient is aware of the fact that transient use of cardiopulmonary bypass may be necessary.Although she is 84 years old she is still relatively active and independent and is a low surgical risk patient so I think she would be a candidate for sternotomy if needed for emergent correction of any intraoperative complications.  The patient has been advised of a variety of complications that might develop including but not limited to risks of death, stroke, paravalvular leak, aortic dissection or other major vascular complications, aortic annulus rupture, device embolization, cardiac rupture or perforation, mitral regurgitation, acute myocardial infarction, arrhythmia, heart block or bradycardia requiring permanent pacemaker placement, congestive heart failure,  respiratory failure, renal failure, pneumonia, infection, other late complications related to structural valve deterioration or migration, or other complications that might ultimately cause a temporary or permanent loss of functional independence or  other long term morbidity. The patient provides full informed consent for the procedure as described and all questions were answered.    DETAILS OF THE OPERATIVE PROCEDURE  PREPARATION:    The patient is brought to the operating room on the above mentioned date and central monitoring was established by the anesthesia team including placement of a central venous line and radial arterial line. The patient is placed in the supine position on the operating table.  Intravenous antibiotics are administered. The patient is monitored closely throughout the procedure under conscious sedation.   Baseline transthoracic echocardiogram was performed. The patient's chest, abdomen, both groins, and both lower extremities are prepared and draped in a sterile manner. A time out procedure is performed.   PERIPHERAL ACCESS:    Using the modified Seldinger technique, femoral arterial and venous access was obtained with placement of 6 Fr sheaths on the left side.  A pigtail diagnostic catheter was passed through the left arterial sheath under fluoroscopic guidance into the aortic root.  A temporary transvenous pacemaker catheter was passed through the left femoral venous sheath under fluoroscopic guidance into the right ventricle.  The pacemaker was tested to ensure stable lead placement and pacemaker capture. Aortic root angiography was performed in order to determine the optimal angiographic angle for valve deployment.   TRANSFEMORAL ACCESS:   Percutaneous transfemoral access and sheath placement was performed using ultrasound guidance.  The right common femoral artery was cannulated using a micropuncture needle and appropriate location was verified using hand  injection angiogram.  A pair of [employer] Perclose percutaneous closure devices were placed and a 6 French sheath replaced into the femoral artery.  The patient was heparinized systemically and ACT verified > 250 seconds.    A 14 Fr transfemoral E-sheath was introduced into the right femoral artery after progressively dilating over an Amplatz superstiff wire. An AL-1 catheter was used to direct a straight-tip exchange length wire across the native aortic valve into the left ventricle. This was exchanged out for a pigtail catheter and position was confirmed in the LV apex. Simultaneous LV and Ao pressures were recorded.  The pigtail catheter was exchanged for an Amplatz Extra-stiff wire in the LV apex.     BALLOON AORTIC VALVULOPLASTY:   Not performed  TRANSCATHETER HEART VALVE DEPLOYMENT:   An Edwards Sapien 3 transcatheter heart valve (size 23 mm) was prepared and crimped per manufacturer's guidelines, and the proper orientation of the valve is confirmed on the [employer] delivery system. The valve was advanced through the introducer sheath using normal technique until in an appropriate position in the abdominal aorta beyond the sheath tip. The balloon was then retracted and using the fine-tuning wheel was centered on the valve. The valve was then advanced across the aortic arch using appropriate flexion of the catheter. The valve was carefully positioned across the aortic valve annulus. The Commander catheter was retracted using normal technique. Once final position of the valve has been confirmed by angiographic assessment, the valve is deployed while temporarily holding ventilation and during rapid ventricular pacing to maintain systolic blood pressure < 50 mmHg and pulse pressure < 10 mmHg. The balloon inflation is held for >3 seconds after reaching full deployment volume. Once the balloon has fully deflated the balloon is retracted into the ascending aorta and valve function is assessed using  echocardiography. There is felt to be no paravalvular leak and no central aortic insufficiency.  The patient's hemodynamic recovery following valve deployment is good.  The deployment balloon  and guidewire are both removed.    PROCEDURE COMPLETION:   The sheath was removed and femoral artery closure performed using the previously placed Perclose devices.   Protamine was administered once femoral arterial repair was complete. The temporary pacemaker, pigtail catheters and femoral sheaths were removed using a [employer] closure device for the left femoral artery and manual pressure used for the left femoral vein.  The patient tolerated the procedure well and is transported to the surgical intensive care in stable condition. There were no immediate intraoperative complications. All sponge instrument and needle counts are verified correct at completion of the operation.   No blood products were administered during the operation.  The patient received a total of 40 mL of intravenous contrast during the procedure.   Gaye Pollack, MD 03/03/2018 4:48 PM

## 2018-03-03 NOTE — Anesthesia Procedure Notes (Signed)
Procedure Name: MAC Date/Time: 03/03/2018 1:13 PM Performed by: Teressa Lower., CRNA Pre-anesthesia Checklist: Patient identified, Emergency Drugs available, Suction available and Patient being monitored Patient Re-evaluated:Patient Re-evaluated prior to induction Oxygen Delivery Method: Simple face mask Ventilation: Oral airway inserted - appropriate to patient size

## 2018-03-03 NOTE — Transfer of Care (Signed)
Immediate Anesthesia Transfer of Care Note  [name]  Procedure(s) Performed: TRANSCATHETER AORTIC VALVE REPLACEMENT, TRANSFEMORAL (N/A Chest) TRANSESOPHAGEAL ECHOCARDIOGRAM (TEE) (N/A )  Patient Location: Cath Lab  Anesthesia Type:MAC  Level of Consciousness: drowsy  Airway & Oxygen Therapy: Patient Spontanous Breathing and Patient connected to face mask oxygen  Post-op Assessment: Report given to RN and Post -op Vital signs reviewed and stable  Post vital signs: Reviewed and stable  Last Vitals:  Vitals Value Taken Time  BP 103/43 03/03/2018  2:42 PM  Temp    Pulse 63 03/03/2018  2:43 PM  Resp 16 03/03/2018  2:43 PM  SpO2 98 % 03/03/2018  2:43 PM  Vitals shown include unvalidated device data.  Last Pain:  Vitals:   03/03/18 1442  TempSrc:   PainSc: Asleep         Complications: No apparent anesthesia complications

## 2018-03-04 ENCOUNTER — Encounter: Payer: Self-pay | Admitting: Thoracic Surgery (Cardiothoracic Vascular Surgery)

## 2018-03-04 ENCOUNTER — Inpatient Hospital Stay (HOSPITAL_COMMUNITY): Payer: Medicare Other

## 2018-03-04 ENCOUNTER — Encounter (HOSPITAL_COMMUNITY): Payer: Self-pay | Admitting: Cardiovascular Disease

## 2018-03-04 DIAGNOSIS — I10 Essential (primary) hypertension: Secondary | ICD-10-CM

## 2018-03-04 DIAGNOSIS — I350 Nonrheumatic aortic (valve) stenosis: Secondary | ICD-10-CM

## 2018-03-04 DIAGNOSIS — Z952 Presence of prosthetic heart valve: Secondary | ICD-10-CM

## 2018-03-04 LAB — CBC
HCT: 33.2 % — ABNORMAL LOW (ref 36.0–46.0)
Hemoglobin: 10.8 g/dL — ABNORMAL LOW (ref 12.0–15.0)
MCH: 29.3 pg (ref 26.0–34.0)
MCHC: 32.5 g/dL (ref 30.0–36.0)
MCV: 90 fL (ref 80.0–100.0)
Platelets: 248 10*3/uL (ref 150–400)
RBC: 3.69 MIL/uL — AB (ref 3.87–5.11)
RDW: 13.4 % (ref 11.5–15.5)
WBC: 8.6 10*3/uL (ref 4.0–10.5)
nRBC: 0 % (ref 0.0–0.2)

## 2018-03-04 LAB — BASIC METABOLIC PANEL
Anion gap: 7 (ref 5–15)
BUN: 12 mg/dL (ref 8–23)
CO2: 26 mmol/L (ref 22–32)
Calcium: 8.7 mg/dL — ABNORMAL LOW (ref 8.9–10.3)
Chloride: 108 mmol/L (ref 98–111)
Creatinine, Ser: 0.94 mg/dL (ref 0.44–1.00)
GFR calc Af Amer: >60 mL/min (ref >60)
GFR calc non Af Amer: 57 mL/min — ABNORMAL LOW (ref >60)
GLUCOSE: 115 mg/dL — AB (ref 70–99)
Potassium: 3.6 mmol/L (ref 3.5–5.1)
Sodium: 141 mmol/L (ref 135–145)

## 2018-03-04 LAB — GLUCOSE, CAPILLARY
Glucose-Capillary: 130 mg/dL — ABNORMAL HIGH (ref 70–99)
Glucose-Capillary: 174 mg/dL — ABNORMAL HIGH (ref 70–99)
Glucose-Capillary: 152 mg/dL — ABNORMAL HIGH (ref 70–99)
Glucose-Capillary: 131 mg/dL — ABNORMAL HIGH (ref 70–99)
Glucose-Capillary: 132 mg/dL — ABNORMAL HIGH (ref 70–99)

## 2018-03-04 LAB — ECHOCARDIOGRAM LIMITED
Height: 64 in
Weight: 3100.55 oz

## 2018-03-04 LAB — MAGNESIUM: Magnesium: 1.7 mg/dL (ref 1.7–2.4)

## 2018-03-04 MED ORDER — ASPIRIN 81 MG PO CHEW: 81.0000 mg | CHEWABLE_TABLET | Freq: Every day | ORAL | Status: AC

## 2018-03-04 MED ORDER — VALSARTAN 80 MG PO TABS: 80.0000 mg | ORAL_TABLET | Freq: Every day | ORAL | 6 refills | Status: DC

## 2018-03-04 MED ORDER — CLOPIDOGREL BISULFATE 75 MG PO TABS: 75.0000 mg | ORAL_TABLET | Freq: Every day | ORAL | 1 refills | Status: DC

## 2018-03-04 NOTE — Discharge Instructions (Signed)

## 2018-03-04 NOTE — Progress Notes (Signed)
[facility]ST RN called unit and spoke with pt's nurse regarding order to discontinue central line. Pt's nurse verbalized that line has been dc'd already and she is catching up on charting.

## 2018-03-04 NOTE — Progress Notes (Addendum)
[place] VALVE TEAM  Patient Name: Megan [name] Date of Encounter: 03/04/2018  Primary Cardiologist: Dr. Johnsie Cancel / Dr. Burt Knack & Dr. Cyndia Bent (TAVR)  Hospital Problem List     Principal Problem:   S/P TAVR (transcatheter aortic valve replacement) Active Problems:   Severe aortic stenosis   Sensorineural hearing loss (SNHL), bilateral   OSA (obstructive sleep apnea)   Diabetes mellitus without complication ([place])   Hypertension   Hypothyroidism   Subjective   Has been up walking around. Feels much better in terms of her breathing already. Sitting up in chair with no complaints. Had some neck pain from sleeping in hospital bed. Eager to go home  Inpatient Medications    Scheduled Meds: . aspirin  81 mg Oral Daily  . clopidogrel  75 mg Oral Q breakfast  . gabapentin  100 mg Oral QHS  . insulin aspart  0-24 Units Subcutaneous TID AC & HS  . levothyroxine  125 mcg Oral QAC breakfast  . mirabegron ER  50 mg Oral Daily  . omega-3 acid ethyl esters  1 g Oral Daily  . rosuvastatin  5 mg Oral q1800  . sodium chloride flush  3 mL Intravenous Q12H   Continuous Infusions: . sodium chloride    . cefUROXime (ZINACEF)  IV Stopped (03/04/18 0210)  . nitroGLYCERIN    . phenylephrine (NEO-SYNEPHRINE) Adult infusion     PRN Meds: sodium chloride, acetaminophen **OR** acetaminophen, hydrocortisone cream, metoprolol tartrate, morphine injection, ondansetron (ZOFRAN) IV, oxyCODONE, sodium chloride flush, traMADol   Vital Signs    Vitals:   03/04/18 0408 03/04/18 0500 03/04/18 0630 03/04/18 0756  BP: (!) 123/56   (!) 121/56  Pulse: 67   95  Resp: 20  18 16   Temp: 99 F (37.2 C)   99.2 F (37.3 C)  TempSrc: Oral   Oral  SpO2: 95%  93% 92%  Weight:  87.9 kg    Height:        Intake/Output Summary (Last 24 hours) at 03/04/2018 0834 Last data filed at 03/04/2018 0600 Gross per 24 hour  Intake 2370.06 ml  Output 1120 ml  Net 1250.06 ml    Filed Weights   03/03/18 1001 03/03/18 1015 03/04/18 0500  Weight: 88.3 kg 86.2 kg 87.9 kg    Physical Exam   GEN: Well nourished, well developed, in no acute distress.  HEENT: Grossly normal.  Neck: Supple, no JVD, carotid bruits, or masses. Cardiac: mildly tachy, soft flow murmur. No rubs, or gallops. No clubbing, cyanosis, edema.  Radials/DP/PT 2+ and equal bilaterally.  Respiratory:  Respirations regular and unlabored, clear to auscultation bilaterally. GI: Soft, nontender, nondistended, BS + x 4. MS: no deformity or atrophy. Skin: warm and dry, no rash. Groin site with no hematoma or ecchymosis. Small amount of blood on right groin gauze.  Neuro:  Strength and sensation are intact. Psych: AAOx3.  Normal affect.  Labs    CBC Recent Labs    03/02/18 0941  03/03/18 1452 03/04/18 0430  WBC 7.8  --   --  8.6  HGB 12.4   < > 11.2* 10.8*  HCT 39.8   < > 33.0* 33.2*  MCV 90.9  --   --  90.0  PLT 271  --   --  248   < > = values in this interval not displayed.   Basic Metabolic Panel Recent Labs    03/02/18 0941  03/03/18 1448 03/03/18 1452 03/03/18 1458 03/04/18  0430  NA 138   < > 136 141  --  141  K 3.7   < > 3.5 3.4*  --  3.6  CL 105  --   --   --   --  108  CO2 18*  --   --   --   --  26  GLUCOSE 257*   < > 105*  --   --  115*  BUN 19  --   --   --   --  12  CREATININE 1.00  --   --   --  0.70 0.94  CALCIUM 9.2  --   --   --   --  8.7*  MG  --   --   --   --   --  1.7   < > = values in this interval not displayed.   Liver Function Tests Recent Labs    03/02/18 0941  AST 22  ALT 10  ALKPHOS 62  BILITOT 0.7  PROT 7.4  ALBUMIN 3.8   No results for input(s): LIPASE, AMYLASE in the last 72 hours. Cardiac Enzymes No results for input(s): CKTOTAL, CKMB, CKMBINDEX, TROPONINI in the last 72 hours. BNP Invalid input(s): POCBNP D-Dimer No results for input(s): DDIMER in the last 72 hours. Hemoglobin A1C Recent Labs    03/02/18 0923  HGBA1C 7.7*    Fasting Lipid Panel No results for input(s): CHOL, HDL, LDLCALC, TRIG, CHOLHDL, LDLDIRECT in the last 72 hours. Thyroid Function Tests No results for input(s): TSH, T4TOTAL, T3FREE, THYROIDAB in the last 72 hours.  Invalid input(s): FREET3  Telemetry    Sinus tach HR low 100s.  - Personally Reviewed  ECG    Sinus with old RBBB - Personally Reviewed  Radiology    Dg Chest 2 View  Result Date: 03/02/2018 CLINICAL DATA:  Preop for transcatheter aortic valve replacement. EXAM: CHEST - 2 VIEW COMPARISON:  Radiographs of July 29, 2016. FINDINGS: The heart size and mediastinal contours are within normal limits. Both lungs are clear. No pneumothorax or pleural effusion is noted. Status post bilateral shoulder arthroplasties. IMPRESSION: No active cardiopulmonary disease. Electronically Signed   By: Marijo Conception, M.D.   On: 03/02/2018 14:57   Dg Chest Port 1 View  Result Date: 03/03/2018 CLINICAL DATA:  84 year old female status post TAVR postoperative day zero. EXAM: PORTABLE CHEST 1 VIEW COMPARISON:  03/02/2018 chest radiographs and earlier. FINDINGS: Portable AP semi upright view at 1641 hours. Right IJ central line in place, tip at the lower SVC level. Incidental azygos fissure (normal variant). Aortic valve prosthesis. Stable mediastinal contour. Visualized tracheal air column is within normal limits. Allowing for portable technique the lungs are clear. No pneumothorax. Bilateral shoulder arthroplasty. Paucity of bowel gas in the upper abdomen. IMPRESSION: 1. Right IJ central line in place, tip at the lower SVC level. 2. Status post TAVR with no acute cardiopulmonary abnormality. Electronically Signed   By: Genevie Ann M.D.   On: 03/03/2018 17:05    Cardiac Studies    TAVR OPERATIVE NOTE   Date of Procedure:                03/03/2018  Preoperative Diagnosis:      Severe Aortic Stenosis   Postoperative Diagnosis:    Same   Procedure:        Transcatheter Aortic Valve  Replacement - Percutaneous Right Transfemoral Approach             Edwards Sapien 3  THV (size 23 mm, model # 9600TFX, serial # [id number])              Co-Surgeons:                        Gaye Pollack, MD and Sherren Mocha, MD   Anesthesiologist:                  Hoy Morn, MD  Echocardiographer:              Liane Comber, MD  Pre-operative Echo Findings: ? Severe aortic stenosis ? Normal left ventricular systolic function  Post-operative Echo Findings: ? No paravalvular leak ? Normal left ventricular systolic function  _______________  Echo 03/04/18: pending  Patient Profile     Megan [name] is a 84 y.o. female with a history of DMT2, HTN, hypothyroidism and severe AS who presented to [facility] for planned TAVR.   Assessment & Plan    Severe AS: s/p successful TAVR with a 23 mm Edwards Sapien 3 THV via the TF approach on 03/03/18. Post operative echo pending.  Groin sites with no hematoma or ecchymosis. Small amount of blood on right groin gauze. ECG with sinus and old RBBB and no high grade heart block. Continue Asprin and plavix. Plan to DC central line today. Possible discharge later today if she continues to do well.   HTN: BP well controlled currently. Currently holding home Exforge and Diazide.   DMT2: SSI while admitted   Hypothyroid: continue synthroid   Signed, Angelena Form, PA-C  03/04/2018, 8:34 AM  Pager [phone]  Patient seen, examined. Available data reviewed. Agree with findings, assessment, and plan as outlined by Nell Range, PA-C.  On my exam, the patient is alert, oriented, in no distress.  JVP is normal, lung fields are clear, heart is regular rate and rhythm with grade 2/6 systolic ejection murmur at the right upper sternal border, no diastolic murmur.  Abdomen is soft and nontender, bilateral groin sites are clear.  Extremities have no edema.  Postoperative echo demonstrates vigorous LV systolic function, normal TAVR function with a mean gradient of  19 mmHg and no paravalvular regurgitation.  The patient will be discharged on aspirin and clopidogrel.  Postsurgical restrictions are discussed.  Follow-up has been arranged.  Telemetry is reviewed and shows no evidence of heart block.  Sherren Mocha, M.D. 03/04/2018 12:46 PM

## 2018-03-04 NOTE — Discharge Summary (Deleted)
[facility] TE[facility]  Discharge Summary    Patient ID: Megan [name] MRN: [id number]; DOB: 1934/10/06  Admit date: 03/03/2018 Discharge date: 03/04/2018  Primary Care Provider: Merrilee Seashore, MD  Primary Cardiologist: Jenkins Rouge, MD / Dr. Burt Knack & Dr. Cyndia Bent (TAVR)  Discharge Diagnoses    Principal Problem:   S/P TAVR (transcatheter aortic valve replacement) Active Problems:   Severe aortic stenosis   Sensorineural hearing loss (SNHL), bilateral   OSA (obstructive sleep apnea)   Diabetes mellitus without complication ([place])   Hypertension   Hypothyroidism   Allergies No Known Allergies  Diagnostic Studies/Procedures   TAVR OPERATIVE NOTE   Date of Procedure:03/03/2018  Preoperative Diagnosis:Severe Aortic Stenosis   Postoperative Diagnosis:Same   Procedure:   Transcatheter Aortic Valve Replacement - PercutaneousRightTransfemoral Approach Edwards Sapien 3 THV (size 12mm, model # 9600TFX, serial # [id number])  Co-Surgeons:Bryan Alveria Apley, MD and Sherren Mocha, MD   Anesthesiologist:Stephen Gifford Shave, MD  Anne Hahn, MD  Pre-operative Echo Findings: ? Severe aortic stenosis ? Normalleft ventricular systolic function  Post-operative Echo Findings: ? Noparavalvular leak ? Normalleft ventricular systolic function  _______________  Echo 03/04/18:  IMPRESSIONS  1. The left ventricle has hyperdynamic systolic function of >46%. The cavity size is normal. There is no increased left ventricular wall thickness. Echo evidence of impaired diastolic relaxation.  2. The right ventricle has normal systolic function. The cavity in normal in size. There is no increase in right ventricular wall thickness. Right ventricular systolic pressure is mildly elevated with an estimated pressure of 43.0 mmHg.  3.  The mitral valve is normal in structure There is moderate thickening and moderate calcification. There is moderate mitral annular calcification present. moderate mitral valve stenosis.  4. A 37mm Edwards Sapien bioprosthetic aortic valve valve is present in the aortic position. Procedure Date: 03/03/2018 Normal aortic valve prosthesis.  5. There is moderate thickening and severe calcifcation of the aortic valve.  6. TAVR mean gradient mildly increased (52mmHg). Valve is structurally stable without significant regurgitation or stenosis.  7. The inferior vena cava was dilated in size with >50% respiratory variability.   History of Present Illness     Megan [name] is a 84 y.o. female with a history of DMT2, HTN, hypothyroidism and severe AS who presented to [facility] for planned TAVR.   Patient had an episode of dizziness in the past and was referred to neurology due to concerns about the possibility of having a stroke. MRI negative for CVA. She has been on plavix since that time. She was noted to have a heart murmur on exam and was referred to Dr. Johnsie Cancel in 2018. An echocardiogram in December 2018 showed moderate aortic stenosis.  A follow-up echocardiogram on 01/23/2018 showed progression of her aortic stenosis to severe with a mean gradient of 39 mmHg and a peak gradient of 67 mmHg.  The valve area was measured at 0.68 cm with a dimensionless index of 0.24.  Left ventricular systolic function remained normal.  She reported that she had developed exertional fatigue and shortness of breath that occurs with any significant work around the house or walking long distances. L/RHC confirmed severe AS with normal cors.  The patient has been evaluated by the multidisciplinary valve team and felt to have severe, symptomatic aortic stenosis and to be a suitable candidate for TAVR, which was set up for 03/03/2018.    Hospital Course     Consultants: none  Severe AS:s/p successful TAVR with a  23 mm Edwards Sapien 3  THV via the TF approach on 03/03/18. Post operative echo showed EF 65%, normally functioning TAVR with mildly elevated mean gradient of 33mm Hg and no PVL. Groin sites with no hematoma or ecchymosis. Small amount of blood on right groin gauze. ECG with sinus and old RBBB and no high grade heart block. Continue Asprin and plavix. Plan for DC home today with close outpatient follow up next week.   HTN: BP has been well controlled off home antihypertensives. Currently holding home Exforge (amlodipine 5- valsartan 320mg ) and Diazide. Will plan to resume valsartan at lower dose 80mg  daily given low to normal BPs now. I can add back other meds as needed as an outpatient.   DMT2: treated with SSI while admitted. Resume home regimen at discharge   Hypothyroid: continue synthroid  Incidental findings: pre TAVR CT angio showed long segment on circumferential thickening of thoracic esophagus. DDx: inflammation, tumor. May need EGD. Multiple mediastinal lymph nodes. DDx: chronic granulomatous disease, chronic inflammation from esophagitis/GERD, myeloproliferative disorder, or, if there is occult esophageal tumor, potentially mediastinal metastases." She does have a history of GERD with hiatal hernia. Will discuss these findings with her as an outpatient and consider GI referral  _____________  Discharge Vitals Blood pressure (!) 121/56, pulse 95, temperature 99.2 F (37.3 C), temperature source Oral, resp. rate 16, height 5\' 4"  (1.626 m), weight 87.9 kg, SpO2 92 %.  Filed Weights   03/03/18 1001 03/03/18 1015 03/04/18 0500  Weight: 88.3 kg 86.2 kg 87.9 kg    Labs & Radiologic Studies    CBC Recent Labs    03/02/18 0941  03/03/18 1452 03/04/18 0430  WBC 7.8  --   --  8.6  HGB 12.4   < > 11.2* 10.8*  HCT 39.8   < > 33.0* 33.2*  MCV 90.9  --   --  90.0  PLT 271  --   --  248   < > = values in this interval not displayed.   Basic Metabolic Panel Recent Labs    03/02/18 0941  03/03/18 1448  03/03/18 1452 03/03/18 1458 03/04/18 0430  NA 138   < > 136 141  --  141  K 3.7   < > 3.5 3.4*  --  3.6  CL 105  --   --   --   --  108  CO2 18*  --   --   --   --  26  GLUCOSE 257*   < > 105*  --   --  115*  BUN 19  --   --   --   --  12  CREATININE 1.00  --   --   --  0.70 0.94  CALCIUM 9.2  --   --   --   --  8.7*  MG  --   --   --   --   --  1.7   < > = values in this interval not displayed.   Liver Function Tests Recent Labs    03/02/18 0941  AST 22  ALT 10  ALKPHOS 62  BILITOT 0.7  PROT 7.4  ALBUMIN 3.8   No results for input(s): LIPASE, AMYLASE in the last 72 hours. Cardiac Enzymes No results for input(s): CKTOTAL, CKMB, CKMBINDEX, TROPONINI in the last 72 hours. BNP Invalid input(s): POCBNP D-Dimer No results for input(s): DDIMER in the last 72 hours. Hemoglobin A1C Recent Labs    03/02/18 0923  HGBA1C 7.7*  Fasting Lipid Panel No results for input(s): CHOL, HDL, LDLCALC, TRIG, CHOLHDL, LDLDIRECT in the last 72 hours. Thyroid Function Tests No results for input(s): TSH, T4TOTAL, T3FREE, THYROIDAB in the last 72 hours.  Invalid input(s): FREET3 _____________  Dg Chest 2 View  Result Date: 03/02/2018 CLINICAL DATA:  Preop for transcatheter aortic valve replacement. EXAM: CHEST - 2 VIEW COMPARISON:  Radiographs of July 29, 2016. FINDINGS: The heart size and mediastinal contours are within normal limits. Both lungs are clear. No pneumothorax or pleural effusion is noted. Status post bilateral shoulder arthroplasties. IMPRESSION: No active cardiopulmonary disease. Electronically Signed   By: Marijo Conception, M.D.   On: 03/02/2018 14:57   Ct Coronary Morph W/cta Cor W/score W/ca W/cm &/or Wo/cm  Addendum Date: 02/23/2018   ADDENDUM REPORT: 02/23/2018 14:42 EXAM: OVER-READ INTERPRETATION  CT CHEST The following report is an over-read performed by Dr. Earleen Newport of [facility], [place] on 02/23/2018. This over-read does not include interpretation of cardiac or  coronary anatomy or pathology. The coronary CTA interpretation by the cardiologist is attached. COMPARISON:   None. FINDINGS: Extracardiac findings will be described separately under dictation for contemporaneously obtained CTA chest, abdomen and pelvis. IMPRESSION: Please see separate dictation for contemporaneously obtained CTA chest, abdomen and pelvis dated 02/19/2018 for full description of relevant extracardiac findings. Electronically Signed   By: Corrie Mckusick D.O.   On: 02/23/2018 14:42   Result Date: 02/23/2018 CLINICAL DATA:  Aortic Stenosis EXAM: Cardiac TAVR CT TECHNIQUE: The patient was scanned on a Siemens Force 588 slice scanner. A 120 kV retrospective scan was triggered in the ascending thoracic aorta at 140 HU's. Gantry rotation speed was 250 msecs and collimation was .6 mm. No beta blockade or nitro were given. The 3D data set was reconstructed in 5% intervals of the R-R cycle. Systolic and diastolic phases were analyzed on a dedicated work station using MPR, MIP and VRT modes. The patient received 80 cc of contrast. FINDINGS: Aortic Valve: Tri leaflet and calcified with restricted leaflet motion Aorta: Non aneurysmal appears to be some anomalous origin to right subclavian and tortuous left carotid see separate radiology report Sino-tubular Junction: 2.4 cm Ascending Thoracic Aorta: 2.9 cm Aortic Arch: 2.3 cm Descending Thoracic Aorta: 2.0 cm Sinus of Valsalva Measurements: Non-coronary: 28 mm Right - coronary: 25.6 mm Left -   coronary: 26.3 mm Coronary Artery Height above Annulus: Left Main: 12.8 mm above annulus Right Coronary: 13.6 mm above annulus Virtual Basal Annulus Measurements: Maximum / Minimum Diameter: 25.7 mm x 19.5 mm Perimeter: 74 mm Area: 415 mm2 Coronary Arteries: Sufficient height above annulus for deployment Optimum Fluoroscopic Angle for Delivery: LAO 3 Cranial 2 degrees IMPRESSION: 1. Tri leaflet AV with annular area of 415 mm2 suitable for a 23 mm Sapien 3 valve 2. Optimum  angiographic angle for deployment LAO 3 Cranial 2 degrees 3.  Normal aortic root size 2.9 cm?  Anomalous arch vessels 4.  Coronary arteries suitable height above annulus for deployment 5.  No LAA thrombus Jenkins Rouge Electronically Signed: By: Jenkins Rouge M.D. On: 02/23/2018 12:25   Dg Chest Port 1 View  Result Date: 03/03/2018 CLINICAL DATA:  84 year old female status post TAVR postoperative day zero. EXAM: PORTABLE CHEST 1 VIEW COMPARISON:  03/02/2018 chest radiographs and earlier. FINDINGS: Portable AP semi upright view at 1641 hours. Right IJ central line in place, tip at the lower SVC level. Incidental azygos fissure (normal variant). Aortic valve prosthesis. Stable mediastinal contour. Visualized tracheal air column is within  normal limits. Allowing for portable technique the lungs are clear. No pneumothorax. Bilateral shoulder arthroplasty. Paucity of bowel gas in the upper abdomen. IMPRESSION: 1. Right IJ central line in place, tip at the lower SVC level. 2. Status post TAVR with no acute cardiopulmonary abnormality. Electronically Signed   By: Genevie Ann M.D.   On: 03/03/2018 17:05   Ct Angio Chest Aorta W &/or Wo Contrast  Result Date: 02/23/2018 CLINICAL DATA:  84 year old female with a history of preoperative evaluation for potential trans arterial aortic valve replacement EXAM: CT ANGIOGRAPHY CHEST, ABDOMEN AND PELVIS TECHNIQUE: Multidetector CT imaging through the chest, abdomen and pelvis was performed using the standard protocol during bolus administration of intravenous contrast. Multiplanar reconstructed images and MIPs were obtained and reviewed to evaluate the vascular anatomy. Gated cardiac study is also included, which has been interpreted and build by separate provider, under a separate accession number. CONTRAST:  51mL ISOVUE-370 IOPAMIDOL (ISOVUE-370) INJECTION 76% COMPARISON:  None. FINDINGS: CTA CHEST FINDINGS Cardiovascular: Heart: Borderline enlarged heart. No pericardial  fluid/thickening. Calcified atherosclerosis of the left anterior descending coronary artery. Calcifications of the aortic valve and the mitral annulus. Diameter of the sino-tubular junction 2.5 cm. Greatest diameter of the ascending aorta measures 3.1 cm. Mild atherosclerotic changes of the aortic arch. Aberrant origin of the right subclavian artery. There is a common trunk of the right common carotid artery and the left common carotid artery. Separate origin of the left subclavian artery. No significant atherosclerotic changes at the origin of these vessels. Bilateral vertebral arteries and the bilateral common carotid artery are patent at the neck base. Dominant left vertebral artery. Left subclavian artery patent. Left axillary artery patent. No significant stenosis or significant atherosclerotic changes. Aorta: Mild atherosclerotic changes of the aortic arch. Proximal descending aorta measures 2.2 cm. No dissection. No periaortic fluid. No aneurysm. Minimal tortuosity. Greatest diameter just above the aortic hiatus measures 19 mm. Pulmonary arteries: Contrast bolus timing is not optimized for evaluation of the pulmonary arteries. Main pulmonary artery measures 2.5 cm. Mediastinum/Nodes: Circumferential thickening of the length of the thoracic esophagus. Multiple lymph nodes throughout the mediastinum, some of which are calcified, predominantly right-sided. This includes right lowest paratracheal right hilar, and right peer bronchial. Unremarkable appearance of the thoracic inlet. Lungs/Pleura: Azygos lobe. Centrilobular emphysema. Calcified granuloma of the right upper lobe on image 37 of series 15. Minimal pattern of centrilobular nodularity at the periphery of the left upper lobe. No confluent airspace disease. No endotracheal or endobronchial debris. No peribronchial thickening. No pleural effusion or pneumothorax. Musculoskeletal: Surgical changes of bilateral glenohumeral joint arthroplasty. No acute  displaced fracture identified. No aggressive sclerotic or lucent bony lesions. Flowing anterior osteophyte production of the thoracic spine. No acute displaced fracture. Review of the MIP images confirms the above findings. CTA ABDOMEN AND PELVIS FINDINGS VASCULAR Aorta: Minimal atherosclerotic changes of the abdominal aorta. No aneurysm, dissection, stenosis, or periaortic fluid. Celiac: Minimal atherosclerotic changes at the celiac artery origin with no stenosis or occlusion. Branch vessels are patent. SMA: No significant atherosclerotic changes at the SMA origin. Branches are patent. Renals: Minimal atherosclerotic changes at the origin the bilateral renal arteries with no high-grade stenosis by CT imaging. Normal course caliber and contour of the bilateral renal arteries. The right renal artery is the lowest. IMA: IMA is patent. Right lower extremity: Relatively normal course caliber and contour of the right iliac arterial system with minimal atherosclerotic change. No high-grade stenosis or occlusion. The smallest diameter of the right common iliac  artery estimated 9 mm-10 mm. Hypogastric artery remains patent. External iliac artery is patent with the diameter approximately 8 mm. Common femoral artery without significant atherosclerotic changes. Diameter measures 9 mm. Proximal profunda femoris and SFA patent. Left lower extremity: Relatively normal course caliber and contour of the left iliac system without significant tortuosity, atherosclerotic changes, stenosis or occlusion. Hypogastric artery is patent. Smallest estimated diameter of the common iliac artery is 9 mm-10 mm. Saw list estimated diameter of the external iliac artery 7 mm. No significant atherosclerotic changes of the common femoral artery, with the diameter estimated 8 mm-9 mm. Proximal SFA and profunda femoris patent. Veins: Unremarkable appearance of the venous system. Review of the MIP images confirms the above findings. NON-VASCULAR  Hepatobiliary: Unremarkable appearance of the liver. Cholecystectomy Pancreas: Unremarkable pancreas Spleen: Unremarkable. Adrenals/Urinary Tract: Unremarkable appearance of adrenal glands. Right: No hydronephrosis. Symmetric perfusion to the left. No nephrolithiasis. Unremarkable course of the right ureter. Left: No hydronephrosis. Symmetric perfusion to the right. No nephrolithiasis. Unremarkable course of the left ureter. Tiny gas at the anti dependent urinary bladder which is partially distended. No associated inflammation or urinary bladder wall thickening. Stomach/Bowel: Unremarkable appearance of the stomach. Unremarkable appearance of small bowel. No evidence of obstruction. Appendix is not visualized, however, no inflammatory changes are present adjacent to the cecum to indicate an appendicitis. Colonic diverticula without associated inflammation. Lymphatic: No adenopathy Mesenteric: No free fluid or air. No adenopathy. Reproductive: Unremarkable appearance of uterus and adnexa Other: Small fat containing umbilical hernia Musculoskeletal: No acute displaced fracture. Degenerative changes of the lumbar spine. Grade 1 anterolisthesis of L4 on L5. Vacuum disc phenomenon of L5-S1. No significant bony canal narrowing. IMPRESSION: No acute finding. **An incidental finding of potential clinical significance has been found. There is long segment circumferential thickening of the thoracic esophagus, of uncertain significance. Differential includes inflammation as well as tumor. Correlation with history of symptoms of esophagitis/GERD, as well as possibly upper endoscopy recommended** Multiple mediastinal lymph nodes. Etiology uncertain, potentially related to chronic granulomatous disease, chronic inflammation from esophagitis/GER D, myeloproliferative disorder, or, if there is occult esophageal tumor, potentially mediastinal metastases. Mild atherosclerotic changes of the descending thoracic aorta and the abdominal  aorta with no significant tortuosity, high-grade stenosis, or occlusion. Aortic Atherosclerosis (ICD10-I70.0). Minimal atherosclerotic changes of the bilateral iliac arteries with no significant stenosis, occlusion, or tortuosity. Smallest diameter on the right in the EIA estimated 8 mm, and on the left in the EIA estimated 7 mm. A right axillary/brachial trans arterial approach for deployment is precluded by the presence of aberrant origin of the right subclavian. Minimal pattern of centrilobular nodularity of the left upper lobe, potentially inflammatory/infectious versus chronic changes of granulomatous disease or MAI. No evidence of lobar pneumonia or edema. Imaging diagnoses specific to the cardiac structures/left ventricular outflow tract have been performed and billed by a separate provider. Signed, Dulcy Fanny. Dellia Nims, RPVI [facility] Electronically Signed   By: Corrie Mckusick D.O.   On: 02/23/2018 14:40   Vas US Carotid  Result Date: 02/18/2018 Carotid Arterial Duplex Study Indications: TAVR. Performing Technologist: June Leap RDMS, RVT  Examination Guidelines: A complete evaluation includes B-mode imaging, spectral Doppler, color Doppler, and power Doppler as needed of all accessible portions of each vessel. Bilateral testing is considered an integral part of a complete examination. Limited examinations for reoccurring indications may be performed as noted.  Right Carotid Findings: +----------+--------+--------+--------+--------+------------------+           PSV  cm/sEDV cm/sStenosisDescribeComments           +----------+--------+--------+--------+--------+------------------+ CCA Prox  63      15                                         +----------+--------+--------+--------+--------+------------------+ CCA Distal73      15                                          +----------+--------+--------+--------+--------+------------------+ ICA Prox  91      18      Normal          intimal thickening +----------+--------+--------+--------+--------+------------------+ ICA Distal83      21                                         +----------+--------+--------+--------+--------+------------------+ ECA       66      4                                          +----------+--------+--------+--------+--------+------------------+ +----------+--------+-------+----------------+-------------------+           PSV cm/sEDV cmsDescribe        Arm Pressure (mmHG) +----------+--------+-------+----------------+-------------------+ [id number]             Multiphasic, WNL                    +----------+--------+-------+----------------+-------------------+ +---------+--------+--+--------+--+---------+ VertebralPSV cm/s47EDV cm/s10Antegrade +---------+--------+--+--------+--+---------+  Left Carotid Findings: +----------+--------+--------+--------+--------+------------------+           PSV cm/sEDV cm/sStenosisDescribeComments           +----------+--------+--------+--------+--------+------------------+ CCA Prox  91      16                                         +----------+--------+--------+--------+--------+------------------+ CCA Distal93      19                                         +----------+--------+--------+--------+--------+------------------+ ICA Prox  66      16      Normal          intimal thickening +----------+--------+--------+--------+--------+------------------+ ICA Distal69      16                                         +----------+--------+--------+--------+--------+------------------+ ECA       96      6                                          +----------+--------+--------+--------+--------+------------------+ +----------+--------+--------+----------------+-------------------+ SubclavianPSV cm/sEDV  cm/sDescribe        Arm Pressure (mmHG) +----------+--------+--------+----------------+-------------------+  97              Multiphasic, WNL                    +----------+--------+--------+----------------+-------------------+ +---------+--------+--+--------+--+---------+ VertebralPSV cm/s65EDV cm/s15Antegrade +---------+--------+--+--------+--+---------+  Summary: Right Carotid: The extracranial vessels were near-normal with only minimal wall                thickening or plaque. Left Carotid: The extracranial vessels were near-normal with only minimal wall               thickening or plaque.  *See table(s) above for measurements and observations.  Electronically signed by Deitra Mayo MD on 02/18/2018 at 5:45:47 AM.    Final    Ct Angio Abd/pel W/ And/or W/o  Result Date: 02/23/2018 CLINICAL DATA:  84 year old female with a history of preoperative evaluation for potential trans arterial aortic valve replacement EXAM: CT ANGIOGRAPHY CHEST, ABDOMEN AND PELVIS TECHNIQUE: Multidetector CT imaging through the chest, abdomen and pelvis was performed using the standard protocol during bolus administration of intravenous contrast. Multiplanar reconstructed images and MIPs were obtained and reviewed to evaluate the vascular anatomy. Gated cardiac study is also included, which has been interpreted and build by separate provider, under a separate accession number. CONTRAST:  74mL ISOVUE-370 IOPAMIDOL (ISOVUE-370) INJECTION 76% COMPARISON:  None. FINDINGS: CTA CHEST FINDINGS Cardiovascular: Heart: Borderline enlarged heart. No pericardial fluid/thickening. Calcified atherosclerosis of the left anterior descending coronary artery. Calcifications of the aortic valve and the mitral annulus. Diameter of the sino-tubular junction 2.5 cm. Greatest diameter of the ascending aorta measures 3.1 cm. Mild atherosclerotic changes of the aortic arch. Aberrant origin of the right subclavian artery. There  is a common trunk of the right common carotid artery and the left common carotid artery. Separate origin of the left subclavian artery. No significant atherosclerotic changes at the origin of these vessels. Bilateral vertebral arteries and the bilateral common carotid artery are patent at the neck base. Dominant left vertebral artery. Left subclavian artery patent. Left axillary artery patent. No significant stenosis or significant atherosclerotic changes. Aorta: Mild atherosclerotic changes of the aortic arch. Proximal descending aorta measures 2.2 cm. No dissection. No periaortic fluid. No aneurysm. Minimal tortuosity. Greatest diameter just above the aortic hiatus measures 19 mm. Pulmonary arteries: Contrast bolus timing is not optimized for evaluation of the pulmonary arteries. Main pulmonary artery measures 2.5 cm. Mediastinum/Nodes: Circumferential thickening of the length of the thoracic esophagus. Multiple lymph nodes throughout the mediastinum, some of which are calcified, predominantly right-sided. This includes right lowest paratracheal right hilar, and right peer bronchial. Unremarkable appearance of the thoracic inlet. Lungs/Pleura: Azygos lobe. Centrilobular emphysema. Calcified granuloma of the right upper lobe on image 37 of series 15. Minimal pattern of centrilobular nodularity at the periphery of the left upper lobe. No confluent airspace disease. No endotracheal or endobronchial debris. No peribronchial thickening. No pleural effusion or pneumothorax. Musculoskeletal: Surgical changes of bilateral glenohumeral joint arthroplasty. No acute displaced fracture identified. No aggressive sclerotic or lucent bony lesions. Flowing anterior osteophyte production of the thoracic spine. No acute displaced fracture. Review of the MIP images confirms the above findings. CTA ABDOMEN AND PELVIS FINDINGS VASCULAR Aorta: Minimal atherosclerotic changes of the abdominal aorta. No aneurysm, dissection, stenosis, or  periaortic fluid. Celiac: Minimal atherosclerotic changes at the celiac artery origin with no stenosis or occlusion. Branch vessels are patent. SMA: No significant atherosclerotic changes at the SMA origin. Branches are patent. Renals: Minimal atherosclerotic changes at the origin the  bilateral renal arteries with no high-grade stenosis by CT imaging. Normal course caliber and contour of the bilateral renal arteries. The right renal artery is the lowest. IMA: IMA is patent. Right lower extremity: Relatively normal course caliber and contour of the right iliac arterial system with minimal atherosclerotic change. No high-grade stenosis or occlusion. The smallest diameter of the right common iliac artery estimated 9 mm-10 mm. Hypogastric artery remains patent. External iliac artery is patent with the diameter approximately 8 mm. Common femoral artery without significant atherosclerotic changes. Diameter measures 9 mm. Proximal profunda femoris and SFA patent. Left lower extremity: Relatively normal course caliber and contour of the left iliac system without significant tortuosity, atherosclerotic changes, stenosis or occlusion. Hypogastric artery is patent. Smallest estimated diameter of the common iliac artery is 9 mm-10 mm. Saw list estimated diameter of the external iliac artery 7 mm. No significant atherosclerotic changes of the common femoral artery, with the diameter estimated 8 mm-9 mm. Proximal SFA and profunda femoris patent. Veins: Unremarkable appearance of the venous system. Review of the MIP images confirms the above findings. NON-VASCULAR Hepatobiliary: Unremarkable appearance of the liver. Cholecystectomy Pancreas: Unremarkable pancreas Spleen: Unremarkable. Adrenals/Urinary Tract: Unremarkable appearance of adrenal glands. Right: No hydronephrosis. Symmetric perfusion to the left. No nephrolithiasis. Unremarkable course of the right ureter. Left: No hydronephrosis. Symmetric perfusion to the right. No  nephrolithiasis. Unremarkable course of the left ureter. Tiny gas at the anti dependent urinary bladder which is partially distended. No associated inflammation or urinary bladder wall thickening. Stomach/Bowel: Unremarkable appearance of the stomach. Unremarkable appearance of small bowel. No evidence of obstruction. Appendix is not visualized, however, no inflammatory changes are present adjacent to the cecum to indicate an appendicitis. Colonic diverticula without associated inflammation. Lymphatic: No adenopathy Mesenteric: No free fluid or air. No adenopathy. Reproductive: Unremarkable appearance of uterus and adnexa Other: Small fat containing umbilical hernia Musculoskeletal: No acute displaced fracture. Degenerative changes of the lumbar spine. Grade 1 anterolisthesis of L4 on L5. Vacuum disc phenomenon of L5-S1. No significant bony canal narrowing. IMPRESSION: No acute finding. **An incidental finding of potential clinical significance has been found. There is long segment circumferential thickening of the thoracic esophagus, of uncertain significance. Differential includes inflammation as well as tumor. Correlation with history of symptoms of esophagitis/GERD, as well as possibly upper endoscopy recommended** Multiple mediastinal lymph nodes. Etiology uncertain, potentially related to chronic granulomatous disease, chronic inflammation from esophagitis/GER D, myeloproliferative disorder, or, if there is occult esophageal tumor, potentially mediastinal metastases. Mild atherosclerotic changes of the descending thoracic aorta and the abdominal aorta with no significant tortuosity, high-grade stenosis, or occlusion. Aortic Atherosclerosis (ICD10-I70.0). Minimal atherosclerotic changes of the bilateral iliac arteries with no significant stenosis, occlusion, or tortuosity. Smallest diameter on the right in the EIA estimated 8 mm, and on the left in the EIA estimated 7 mm. 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 Minimal pattern of centrilobular nodularity of the left upper lobe, potentially inflammatory/infectious versus chronic changes of granulomatous disease or MAI. No evidence of lobar pneumonia or edema. Imaging diagnoses specific to the cardiac structures/left ventricular outflow tract have been performed and billed by a separate provider. Signed, Dulcy Fanny. Dellia Nims, RPVI [facility] Radiology Electronically Signed   By: Corrie Mckusick D.O.   On: 02/23/2018 14:40   Disposition   Pt is being discharged home today in good condition.  Follow-up Plans & Appointments  Follow-up Information    Eileen Stanford, PA-C. Go on 03/12/2018.   Specialties:  Cardiology, Radiology Why:  @ 3:30pm, please arrive at least 10 minutes early  Contact information: [place] [id number] [phone]            Discharge Medications   Allergies as of 03/04/2018   No Known Allergies     Medication List    STOP taking these medications   amLODipine-valsartan 5-320 MG tablet Commonly known as:  EXFORGE     triamterene-hydrochlorothiazide 37.5-25 MG capsule Commonly known as:  DYAZIDE     TAKE these medications   ACCU-CHEK AVIVA PLUS test strip Generic drug:  glucose blood 1 each by Other route 2 (two) times daily.   ACCU-CHEK SOFTCLIX LANCETS lancets 1 each by Other route 2 (two) times daily.   acetaminophen 500 MG tablet Commonly known as:  TYLENOL Take 500-1,000 mg by mouth every 6 (six) hours as needed (pain.).   aspirin 81 MG chewable tablet Chew 1 tablet (81 mg total) by mouth daily.   calcium-vitamin D 500-200 MG-UNIT tablet Commonly known as:  OSCAL WITH D Take 1 tablet by mouth daily at 12 noon.   clopidogrel 75 MG tablet Commonly known as:  PLAVIX Take 1 tablet (75 mg total) by mouth daily  with breakfast. What changed:  when to take this   CRESTOR 5 MG tablet Generic drug:  rosuvastatin Take 5 mg by mouth daily.   econazole nitrate 1 % cream Apply 1 application topically daily. For nose/corners of mouth   Fish Oil 1200 MG Caps Take 1,200 mg by mouth daily at 12 noon.   gabapentin 100 MG capsule Commonly known as:  NEURONTIN Take 1 capsule (100 mg total) by mouth at bedtime.   glimepiride 2 MG tablet Commonly known as:  AMARYL Take 2-4 mg by mouth See admin instructions. Take 2 mg by mouth in the morning and4 mg by mouth at night.   hydrocortisone 2.5 % cream Apply 1 application topically daily. Applied to nose/corners of mouth   ketotifen 0.025 % ophthalmic solution Commonly known as:  ZADITOR Place 1 drop into both eyes 2 (two) times daily as needed (for itchy eyes.).   multivitamin with minerals Tabs tablet Take 1 tablet by mouth daily at 12 noon.   MYRBETRIQ 50 MG Tb24 tablet Generic drug:  mirabegron ER Take 50 mg by mouth daily.   sitaGLIPtin 100 MG tablet Commonly known as:  JANUVIA Take 100 mg by mouth daily.   SYNTHROID 125 MCG tablet Generic drug:  levothyroxine Take 125 mcg by mouth daily before breakfast.   valsartan 80 MG tablet Commonly known as:  DIOVAN Take 1 tablet (80 mg total) by mouth daily.        Outstanding Labs/Studies   none  Duration of Discharge Encounter   Greater than 30 minutes including physician time.  Mable Fill, PA-C 03/04/2018, 10:00 AM [phone]

## 2018-03-04 NOTE — Progress Notes (Signed)
No major complaints overnight. Pt endorsed chronic back pain addressed with PRN APAP. Ambulated with little difficulty this AM. Presently sitting in chair with BL groin sites level 0.

## 2018-03-04 NOTE — Progress Notes (Signed)
Scheduled for discharge.  Temp 102.1.  MD notified and awaiting new orders.   Sharyn Lull, RN

## 2018-03-04 NOTE — Progress Notes (Signed)
  Echocardiogram 2D Echocardiogram has been performed.  Megan [name] 03/04/2018, 9:28 AM

## 2018-03-04 NOTE — Progress Notes (Signed)
  [facility] [place], RN was taking patient's IVs out and noticed she felt warm. Temperature has been noted to rise to 102.1 deg F orally. Patient is otherwise doing well with no complaints and groin sites look good without signs of infection. Plan was for DC today, but this will be cancelled. I will order a UA, CXR and repeat CBC in the AM.   Angelena Form PA-C  [facility]

## 2018-03-04 NOTE — Care Management Note (Signed)
Case Management Note Marvetta Gibbons RN, BSN Transitions of Care Unit 4E- RN Case Manager [phone]  Patient Details  Name: Megan [name] MRN: [id number] Date of Birth: [birth year]  Subjective/Objective:   Pt admitted s/p TAVR                 Action/Plan: PTA pt lived at home, anticipate return home, per Cardiac rehab RN- pt declining walker for home, no other CM needs noted for transition home.   Expected Discharge Date:  03/04/18               Expected Discharge Plan:  Home/Self Care  In-House Referral:  NA  Discharge planning Services  CM Consult  Post Acute Care Choice:  Durable Medical Equipment Choice offered to:     DME Arranged:    DME Agency:     HH Arranged:    [place] Agency:     Status of Service:  Completed, signed off  If discussed at [place] of Stay Meetings, dates discussed:    Discharge Disposition: home/self care   Additional Comments:  Dawayne Patricia, RN 03/04/2018, 11:20 AM

## 2018-03-04 NOTE — Progress Notes (Signed)
CARDIAC REHAB PHASE I   PRE:  Rate/Rhythm: 92 SR  BP:  Sitting: 132/108      SaO2: 98 RA  MODE:  Ambulation: 300 ft   POST:  Rate/Rhythm: 97 SR  BP:  Sitting: 156/62    SaO2: 93 RA   Pt ambulated 346ft in hallway standby assist with front wheel walker. Pt denies CP, SOB or dizziness; states states she feels much better than preop. Reviewed site care instructions with pt and family. Encouraged continued ambulation. Reviewed restrictions and exercise guidelines. Will refer to [facility] [place], pt unsure if she will attend.  [id number] Rufina Falco, RN BSN 03/04/2018 10:59 AM

## 2018-03-05 ENCOUNTER — Encounter (HOSPITAL_COMMUNITY): Payer: Self-pay | Admitting: Cardiovascular Disease

## 2018-03-05 LAB — BASIC METABOLIC PANEL
Anion gap: 14 (ref 5–15)
BUN: 10 mg/dL (ref 8–23)
CO2: 22 mmol/L (ref 22–32)
Calcium: 8.7 mg/dL — ABNORMAL LOW (ref 8.9–10.3)
Chloride: 103 mmol/L (ref 98–111)
Creatinine, Ser: 0.86 mg/dL (ref 0.44–1.00)
GFR calc non Af Amer: >60 mL/min (ref >60)
Glucose, Bld: 138 mg/dL — ABNORMAL HIGH (ref 70–99)
Potassium: 3.1 mmol/L — ABNORMAL LOW (ref 3.5–5.1)
SODIUM: 139 mmol/L (ref 135–145)

## 2018-03-05 LAB — GLUCOSE, CAPILLARY: Glucose-Capillary: 158 mg/dL — ABNORMAL HIGH (ref 70–99)

## 2018-03-05 LAB — CBC
HCT: 31.9 % — ABNORMAL LOW (ref 36.0–46.0)
Hemoglobin: 10.4 g/dL — ABNORMAL LOW (ref 12.0–15.0)
MCH: 28.8 pg (ref 26.0–34.0)
MCHC: 32.6 g/dL (ref 30.0–36.0)
MCV: 88.4 fL (ref 80.0–100.0)
NRBC: 0 % (ref 0.0–0.2)
Platelets: 206 10*3/uL (ref 150–400)
RBC: 3.61 MIL/uL — ABNORMAL LOW (ref 3.87–5.11)
RDW: 13.5 % (ref 11.5–15.5)
WBC: 9.1 10*3/uL (ref 4.0–10.5)

## 2018-03-05 MED ORDER — POTASSIUM CHLORIDE CRYS ER 20 MEQ PO TBCR
80.0000 meq | EXTENDED_RELEASE_TABLET | Freq: Once | ORAL | Status: AC
  Administered 2018-03-05: 80 meq via ORAL
  Filled 2018-03-05: qty 4

## 2018-03-05 NOTE — Anesthesia Postprocedure Evaluation (Signed)
Anesthesia Post Note  [name]  Procedure(s) Performed: TRANSCATHETER AORTIC VALVE REPLACEMENT, TRANSFEMORAL (N/A Chest) TRANSESOPHAGEAL ECHOCARDIOGRAM (TEE) (N/A )     Patient location during evaluation: ICU Anesthesia Type: MAC Level of consciousness: awake and alert Pain management: pain level controlled Vital Signs Assessment: post-procedure vital signs reviewed and stable Respiratory status: spontaneous breathing, nonlabored ventilation, respiratory function stable and patient connected to nasal cannula oxygen Cardiovascular status: stable and blood pressure returned to baseline Postop Assessment: no apparent nausea or vomiting Anesthetic complications: no    Last Vitals:  Vitals:   03/05/18 0400 03/05/18 0505  BP: (!) 135/52   Pulse: 87   Resp: 16 (!) 29  Temp: 37.3 C   SpO2: 94%     Last Pain:  Vitals:   03/05/18 0459  TempSrc:   PainSc: Asleep                 Catalina Gravel

## 2018-03-05 NOTE — Consult Note (Signed)
            [facility] CM Primary Care Navigator  03/05/2018  Megan [name] [birth year] [phone]   Went to see patient in the room to identify possible discharge needs but she was already discharged home per staff report. (has referral to cardiac rehab)  Patient was admitted for planned TAVR- transcatheter aortic valve replacement for severe aortic stenosis.    Patient has discharge instruction to follow-up with cardiology on 03/12/18.  Primary care provider's is listed as providing transition of care (TOC) follow-up.   For additional questions please contact:  Edwena Felty A. Jene Huq, BSN, RN-BC [facility] PRIMARY CARE Navigator Cell: [phone]

## 2018-03-05 NOTE — Progress Notes (Signed)
CARDIAC REHAB PHASE I   PRE:  Rate/Rhythm: 92 SR  BP:  Sitting: 121/89      SaO2: 98 RA  MODE:  Ambulation: 425 ft   POST:  Rate/Rhythm: 101 ST  BP:  Sitting: 138/97    SaO2: 97 RA   Pt ambulated 43ft in hallway, standby assist with slow steady gait. Pt denies CP or SOB. Pt states she is feeling better today, and less chilled. Pt for d/c today, referred to [facility] [place].  [id number] Rufina Falco, RN BSN 03/05/2018 11:12 AM

## 2018-03-05 NOTE — Discharge Summary (Addendum)
[place] VALVE TEAM  Discharge Summary    Patient ID: Megan [name] MRN: [id number]; DOB: 1934-05-17  Admit date: 03/03/2018 Discharge date: 03/05/2018  Primary Care Provider: Merrilee Seashore, MD  Primary Cardiologist: Jenkins Rouge, MD / Dr. Burt Knack & Dr. Cyndia Bent (TAVR)  Discharge Diagnoses    Principal Problem:   S/P TAVR (transcatheter aortic valve replacement) Active Problems:   Severe aortic stenosis   Sensorineural hearing loss (SNHL), bilateral   OSA (obstructive sleep apnea)   Diabetes mellitus without complication ([place])   Hypertension   Hypothyroidism   Allergies No Known Allergies  Diagnostic Studies/Procedures   TAVR OPERATIVE NOTE   Date of Procedure:03/03/2018  Preoperative Diagnosis:Severe Aortic Stenosis   Postoperative Diagnosis:Same   Procedure:   Transcatheter Aortic Valve Replacement - PercutaneousRightTransfemoral Approach Edwards Sapien 3 THV (size 41mm, model # 9600TFX, serial # [id number])  Co-Surgeons:Bryan Alveria Apley, MD and Sherren Mocha, MD   Anesthesiologist:Stephen Gifford Shave, MD  Anne Hahn, MD  Pre-operative Echo Findings: ? Severe aortic stenosis ? Normalleft ventricular systolic function  Post-operative Echo Findings: ? Noparavalvular leak ? Normalleft ventricular systolic function  _______________  Echo 03/04/18:  IMPRESSIONS 1. The left ventricle has hyperdynamic systolic function of >21%. The cavity size is normal. There is no increased left ventricular wall thickness. Echo evidence of impaired diastolic relaxation. 2. The right ventricle has normal systolic function. The cavity in normal in size. There is no increase in right ventricular wall thickness. Right ventricular systolic pressure is mildly elevated with an estimated pressure of 43.0 mmHg. 3.  The mitral valve is normal in structure There is moderate thickening and moderate calcification. There is moderate mitral annular calcification present. moderate mitral valve stenosis. 4. A 36mm Edwards Sapien bioprosthetic aortic valve valve is present in the aortic position. Procedure Date: 03/03/2018 Normal aortic valve prosthesis. 5. There is moderate thickening and severe calcifcation of the aortic valve. 6. TAVR mean gradient mildly increased (42mmHg). Valve is structurally stable without significant regurgitation or stenosis. 7. The inferior vena cava was dilated in size with >50% respiratory variability   History of Present Illness     Megan Ferdinand Houptis a 84 y.o.femalewith a history of DMT2, HTN, hypothyroidism and severe AS who presented to [facility] for planned TAVR.  Patient had an episode of dizziness in the past and was referred to neurology due to concerns about the possibility of having a stroke. MRI negative for CVA. She has been on plavix since that time. She was noted to have a heart murmur on exam and was referred to Dr. Johnsie Cancel in 2018. An echocardiogram in December 2018 showed moderate aortic stenosis. A follow-up echocardiogram on 01/23/2018 showed progression of her aortic stenosis to severe with a mean gradient of 39 mmHg and a peak gradient of 67 mmHg. The valve area was measured at 0.68 cm with a dimensionless index of 0.24. Left ventricular systolic function remained normal. She reported that she had developed exertional fatigue and shortness of breath that occurs with any significant work around the house or walking long distances. L/RHC confirmed severe AS with normal cors.  The patient has been evaluated by the multidisciplinary valve team and felt to have severe, symptomatic aortic stenosis and to be a suitable candidate for TAVR, which was set up for 03/03/2018.    Hospital Course     Consultants: none  Severe AS:s/p successful TAVR with a48mm Edwards Sapien 3  THV via the TF approach on 03/03/18. Post operative echo showed  EF 65%, normally functioning TAVR with mildly elevated mean gradient of 12mm Hg and no PVL.Groin sites with no hematoma or ecchymosis. ECG with sinus and old RBBBand no high grade heart block. Continue Asprin and plavix. Plan for DC home today with close outpatient follow up next week.   HTN: BP has been well controlled off home antihypertensives. Currently holding home Exforge (amlodipine 5- valsartan 320mg ) and Diazide. Will resume home Diazide at this time. K low today at 3.1 and has been supplemented. Will check a BMET in office next week. She says that her BP sometimes runs low at home. I have asked her to keep a log over the next week that we can review in the office next week.   DMT2: treated with SSI while admitted. Resume home regimen at discharge   Hypothyroid: continue synthroid  Incidental findings: pre TAVR CT angio showed long segment on circumferential thickening of thoracic esophagus. DDx: inflammation, tumor. May need EGD. Multiple mediastinal lymph nodes. DDx: chronic granulomatous disease, chronic inflammation from esophagitis/GERD, myeloproliferative disorder, or, if there is occult esophageal tumor, potentially mediastinal metastases." She does have a history of GERD with hiatal hernia. Will discuss these findings with her as an outpatient and consider GI referral   Fever: plan was for DC yesterday but she was incidentally found to have a fever. She was asymptomatic aside from a slight headache. She was kept overnight for continued observation. White count is normal and UA and CXR unremarkable. She is stable for discharge today   Discharge Vitals Blood pressure (!) 135/52, pulse 87, temperature 99.2 F (37.3 C), temperature source Oral, resp. rate (!) 29, height 5\' 4"  (1.626 m), weight 88.4 kg, SpO2 94 %.  Filed Weights   03/03/18 1015 03/04/18 0500 03/05/18 0505  Weight: 86.2 kg 87.9 kg 88.4 kg   GEN: Well  nourished, well developed, in no acute distress.  HEENT: Grossly normal.  Neck: Supple, no JVD, carotid bruits, or masses. Cardiac: mildly tachy, soft flow murmur. No rubs, or gallops. No clubbing, cyanosis, edema. Radials/DP/PT 2+ and equal bilaterally.  Respiratory:  Respirations regular and unlabored, clear to auscultation bilaterally. GI: Soft, nontender, nondistended, BS + x 4. MS: no deformity or atrophy. Skin: warm and dry, no rash. Groin site with no hematoma or ecchymosis.  Neuro:  Strength and sensation are intact. Psych: AAOx3.  Normal affect.  Labs & Radiologic Studies    CBC Recent Labs    03/04/18 0430 03/05/18 0303  WBC 8.6 9.1  HGB 10.8* 10.4*  HCT 33.2* 31.9*  MCV 90.0 88.4  PLT 248 539   Basic Metabolic Panel Recent Labs    03/04/18 0430 03/05/18 0303  NA 141 139  K 3.6 3.1*  CL 108 103  CO2 26 22  GLUCOSE 115* 138*  BUN 12 10  CREATININE 0.94 0.86  CALCIUM 8.7* 8.7*  MG 1.7  --    Liver Function Tests Recent Labs    03/02/18 0941  AST 22  ALT 10  ALKPHOS 62  BILITOT 0.7  PROT 7.4  ALBUMIN 3.8   No results for input(s): LIPASE, AMYLASE in the last 72 hours. Cardiac Enzymes No results for input(s): CKTOTAL, CKMB, CKMBINDEX, TROPONINI in the last 72 hours. BNP Invalid input(s): POCBNP D-Dimer No results for input(s): DDIMER in the last 72 hours. Hemoglobin A1C Recent Labs    03/02/18 0923  HGBA1C 7.7*   Fasting Lipid Panel No results for input(s): CHOL, HDL, LDLCALC, TRIG, CHOLHDL, LDLDIRECT in the last 72 hours.  Thyroid Function Tests No results for input(s): TSH, T4TOTAL, T3FREE, THYROIDAB in the last 72 hours.  Invalid input(s): FREET3 _____________  Dg Chest 2 View  Result Date: 03/04/2018 CLINICAL DATA:  Fever for 1 day. History of hypertension and diabetes. Former smoker. EXAM: CHEST - 2 VIEW COMPARISON:  03/03/2018 FINDINGS: Right central venous catheter has been removed in the interval. Cardiac valve prosthesis. Heart size  and pulmonary vascularity are normal. No airspace disease or consolidation in the lungs. Mild blunting of the left costophrenic angle suggesting small effusion or thickened pleura. No pneumothorax. Calcified granuloma in the right mid lung. Azygos lobe. Postoperative bilateral shoulder arthroplasties. Degenerative changes in the spine. Calcification of the aorta. IMPRESSION: No evidence of active pulmonary disease. Aortic atherosclerosis. Electronically Signed   By: Lucienne Capers M.D.   On: 03/04/2018 19:07   Dg Chest 2 View  Result Date: 03/02/2018 CLINICAL DATA:  Preop for transcatheter aortic valve replacement. EXAM: CHEST - 2 VIEW COMPARISON:  Radiographs of July 29, 2016. FINDINGS: The heart size and mediastinal contours are within normal limits. Both lungs are clear. No pneumothorax or pleural effusion is noted. Status post bilateral shoulder arthroplasties. IMPRESSION: No active cardiopulmonary disease. Electronically Signed   By: Marijo Conception, M.D.   On: 03/02/2018 14:57   Ct Coronary Morph W/cta Cor W/score W/ca W/cm &/or Wo/cm  Addendum Date: 02/23/2018   ADDENDUM REPORT: 02/23/2018 14:42 EXAM: OVER-READ INTERPRETATION  CT CHEST The following report is an over-read performed by Dr. Earleen Newport of [facility], [place] on 02/23/2018. This over-read does not include interpretation of cardiac or coronary anatomy or pathology. The coronary CTA interpretation by the cardiologist is attached. COMPARISON:   None. FINDINGS: Extracardiac findings will be described separately under dictation for contemporaneously obtained CTA chest, abdomen and pelvis. IMPRESSION: Please see separate dictation for contemporaneously obtained CTA chest, abdomen and pelvis dated 02/19/2018 for full description of relevant extracardiac findings. Electronically Signed   By: Corrie Mckusick D.O.   On: 02/23/2018 14:42   Result Date: 02/23/2018 CLINICAL DATA:  Aortic Stenosis EXAM: Cardiac TAVR CT TECHNIQUE: The patient was  scanned on a Siemens Force 767 slice scanner. A 120 kV retrospective scan was triggered in the ascending thoracic aorta at 140 HU's. Gantry rotation speed was 250 msecs and collimation was .6 mm. No beta blockade or nitro were given. The 3D data set was reconstructed in 5% intervals of the R-R cycle. Systolic and diastolic phases were analyzed on a dedicated work station using MPR, MIP and VRT modes. The patient received 80 cc of contrast. FINDINGS: Aortic Valve: Tri leaflet and calcified with restricted leaflet motion Aorta: Non aneurysmal appears to be some anomalous origin to right subclavian and tortuous left carotid see separate radiology report Sino-tubular Junction: 2.4 cm Ascending Thoracic Aorta: 2.9 cm Aortic Arch: 2.3 cm Descending Thoracic Aorta: 2.0 cm Sinus of Valsalva Measurements: Non-coronary: 28 mm Right - coronary: 25.6 mm Left -   coronary: 26.3 mm Coronary Artery Height above Annulus: Left Main: 12.8 mm above annulus Right Coronary: 13.6 mm above annulus Virtual Basal Annulus Measurements: Maximum / Minimum Diameter: 25.7 mm x 19.5 mm Perimeter: 74 mm Area: 415 mm2 Coronary Arteries: Sufficient height above annulus for deployment Optimum Fluoroscopic Angle for Delivery: LAO 3 Cranial 2 degrees IMPRESSION: 1. Tri leaflet AV with annular area of 415 mm2 suitable for a 23 mm Sapien 3 valve 2. Optimum angiographic angle for deployment LAO 3 Cranial 2 degrees 3.  Normal aortic root size 2.9 cm?  Anomalous arch vessels 4.  Coronary arteries suitable height above annulus for deployment 5.  No LAA thrombus Jenkins Rouge Electronically Signed: By: Jenkins Rouge M.D. On: 02/23/2018 12:25   Dg Chest Port 1 View  Result Date: 03/03/2018 CLINICAL DATA:  84 year old female status post TAVR postoperative day zero. EXAM: PORTABLE CHEST 1 VIEW COMPARISON:  03/02/2018 chest radiographs and earlier. FINDINGS: Portable AP semi upright view at 1641 hours. Right IJ central line in place, tip at the lower SVC level.  Incidental azygos fissure (normal variant). Aortic valve prosthesis. Stable mediastinal contour. Visualized tracheal air column is within normal limits. Allowing for portable technique the lungs are clear. No pneumothorax. Bilateral shoulder arthroplasty. Paucity of bowel gas in the upper abdomen. IMPRESSION: 1. Right IJ central line in place, tip at the lower SVC level. 2. Status post TAVR with no acute cardiopulmonary abnormality. Electronically Signed   By: Genevie Ann M.D.   On: 03/03/2018 17:05   Ct Angio Chest Aorta W &/or Wo Contrast  Result Date: 02/23/2018 CLINICAL DATA:  84 year old female with a history of preoperative evaluation for potential trans arterial aortic valve replacement EXAM: CT ANGIOGRAPHY CHEST, ABDOMEN AND PELVIS TECHNIQUE: Multidetector CT imaging through the chest, abdomen and pelvis was performed using the standard protocol during bolus administration of intravenous contrast. Multiplanar reconstructed images and MIPs were obtained and reviewed to evaluate the vascular anatomy. Gated cardiac study is also included, which has been interpreted and build by separate provider, under a separate accession number. CONTRAST:  59mL ISOVUE-370 IOPAMIDOL (ISOVUE-370) INJECTION 76% COMPARISON:  None. FINDINGS: CTA CHEST FINDINGS Cardiovascular: Heart: Borderline enlarged heart. No pericardial fluid/thickening. Calcified atherosclerosis of the left anterior descending coronary artery. Calcifications of the aortic valve and the mitral annulus. Diameter of the sino-tubular junction 2.5 cm. Greatest diameter of the ascending aorta measures 3.1 cm. Mild atherosclerotic changes of the aortic arch. Aberrant origin of the right subclavian artery. There is a common trunk of the right common carotid artery and the left common carotid artery. Separate origin of the left subclavian artery. No significant atherosclerotic changes at the origin of these vessels. Bilateral vertebral arteries and the bilateral  common carotid artery are patent at the neck base. Dominant left vertebral artery. Left subclavian artery patent. Left axillary artery patent. No significant stenosis or significant atherosclerotic changes. Aorta: Mild atherosclerotic changes of the aortic arch. Proximal descending aorta measures 2.2 cm. No dissection. No periaortic fluid. No aneurysm. Minimal tortuosity. Greatest diameter just above the aortic hiatus measures 19 mm. Pulmonary arteries: Contrast bolus timing is not optimized for evaluation of the pulmonary arteries. Main pulmonary artery measures 2.5 cm. Mediastinum/Nodes: Circumferential thickening of the length of the thoracic esophagus. Multiple lymph nodes throughout the mediastinum, some of which are calcified, predominantly right-sided. This includes right lowest paratracheal right hilar, and right peer bronchial. Unremarkable appearance of the thoracic inlet. Lungs/Pleura: Azygos lobe. Centrilobular emphysema. Calcified granuloma of the right upper lobe on image 37 of series 15. Minimal pattern of centrilobular nodularity at the periphery of the left upper lobe. No confluent airspace disease. No endotracheal or endobronchial debris. No peribronchial thickening. No pleural effusion or pneumothorax. Musculoskeletal: Surgical changes of bilateral glenohumeral joint arthroplasty. No acute displaced fracture identified. No aggressive sclerotic or lucent bony lesions. Flowing anterior osteophyte production of the thoracic spine. No acute displaced fracture. Review of the MIP images confirms the above findings. CTA ABDOMEN AND PELVIS FINDINGS VASCULAR Aorta: Minimal atherosclerotic changes of the abdominal aorta. No aneurysm, dissection, stenosis, or periaortic  fluid. Celiac: Minimal atherosclerotic changes at the celiac artery origin with no stenosis or occlusion. Branch vessels are patent. SMA: No significant atherosclerotic changes at the SMA origin. Branches are patent. Renals: Minimal  atherosclerotic changes at the origin the bilateral renal arteries with no high-grade stenosis by CT imaging. Normal course caliber and contour of the bilateral renal arteries. The right renal artery is the lowest. IMA: IMA is patent. Right lower extremity: Relatively normal course caliber and contour of the right iliac arterial system with minimal atherosclerotic change. No high-grade stenosis or occlusion. The smallest diameter of the right common iliac artery estimated 9 mm-10 mm. Hypogastric artery remains patent. External iliac artery is patent with the diameter approximately 8 mm. Common femoral artery without significant atherosclerotic changes. Diameter measures 9 mm. Proximal profunda femoris and SFA patent. Left lower extremity: Relatively normal course caliber and contour of the left iliac system without significant tortuosity, atherosclerotic changes, stenosis or occlusion. Hypogastric artery is patent. Smallest estimated diameter of the common iliac artery is 9 mm-10 mm. Saw list estimated diameter of the external iliac artery 7 mm. No significant atherosclerotic changes of the common femoral artery, with the diameter estimated 8 mm-9 mm. Proximal SFA and profunda femoris patent. Veins: Unremarkable appearance of the venous system. Review of the MIP images confirms the above findings. NON-VASCULAR Hepatobiliary: Unremarkable appearance of the liver. Cholecystectomy Pancreas: Unremarkable pancreas Spleen: Unremarkable. Adrenals/Urinary Tract: Unremarkable appearance of adrenal glands. Right: No hydronephrosis. Symmetric perfusion to the left. No nephrolithiasis. Unremarkable course of the right ureter. Left: No hydronephrosis. Symmetric perfusion to the right. No nephrolithiasis. Unremarkable course of the left ureter. Tiny gas at the anti dependent urinary bladder which is partially distended. No associated inflammation or urinary bladder wall thickening. Stomach/Bowel: Unremarkable appearance of the  stomach. Unremarkable appearance of small bowel. No evidence of obstruction. Appendix is not visualized, however, no inflammatory changes are present adjacent to the cecum to indicate an appendicitis. Colonic diverticula without associated inflammation. Lymphatic: No adenopathy Mesenteric: No free fluid or air. No adenopathy. Reproductive: Unremarkable appearance of uterus and adnexa Other: Small fat containing umbilical hernia Musculoskeletal: No acute displaced fracture. Degenerative changes of the lumbar spine. Grade 1 anterolisthesis of L4 on L5. Vacuum disc phenomenon of L5-S1. No significant bony canal narrowing. IMPRESSION: No acute finding. **An incidental finding of potential clinical significance has been found. There is long segment circumferential thickening of the thoracic esophagus, of uncertain significance. Differential includes inflammation as well as tumor. Correlation with history of symptoms of esophagitis/GERD, as well as possibly upper endoscopy recommended** Multiple mediastinal lymph nodes. Etiology uncertain, potentially related to chronic granulomatous disease, chronic inflammation from esophagitis/GER D, myeloproliferative disorder, or, if there is occult esophageal tumor, potentially mediastinal metastases. Mild atherosclerotic changes of the descending thoracic aorta and the abdominal aorta with no significant tortuosity, high-grade stenosis, or occlusion. Aortic Atherosclerosis (ICD10-I70.0). Minimal atherosclerotic changes of the bilateral iliac arteries with no significant stenosis, occlusion, or tortuosity. Smallest diameter on the right in the EIA estimated 8 mm, and on the left in the EIA estimated 7 mm. A right axillary/brachial trans arterial approach for deployment is precluded by the presence of aberrant origin of the right subclavian. Minimal pattern of centrilobular nodularity of the left upper lobe, potentially inflammatory/infectious versus chronic changes of granulomatous  disease or MAI. No evidence of lobar pneumonia or edema. Imaging diagnoses specific to the cardiac structures/left ventricular outflow tract have been performed and billed by a separate provider. Signed, Dulcy Fanny. Dellia Nims, [place] [facility] Electronically Signed   By: Corrie Mckusick D.O.   On: 02/23/2018 14:40   Vas US Carotid  Result Date: 02/18/2018 Carotid Arterial Duplex Study Indications: TAVR. Performing Technologist: June Leap RDMS, RVT  Examination Guidelines: A complete evaluation includes B-mode imaging, spectral Doppler, color Doppler, and power Doppler as needed of all accessible portions of each vessel. Bilateral testing is considered an integral part of a complete examination. Limited examinations for reoccurring indications may be performed as noted.  Right Carotid Findings: +----------+--------+--------+--------+--------+------------------+           PSV cm/sEDV cm/sStenosisDescribeComments           +----------+--------+--------+--------+--------+------------------+ CCA Prox  63      15                                         +----------+--------+--------+--------+--------+------------------+ CCA Distal73      15                                         +----------+--------+--------+--------+--------+------------------+ ICA Prox  91      18      Normal          intimal thickening +----------+--------+--------+--------+--------+------------------+ ICA Distal83      21                                         +----------+--------+--------+--------+--------+------------------+ ECA       66      4                                          +----------+--------+--------+--------+--------+------------------+ +----------+--------+-------+----------------+-------------------+           PSV cm/sEDV cmsDescribe        Arm Pressure (mmHG)  +----------+--------+-------+----------------+-------------------+ [id number]             Multiphasic, WNL                    +----------+--------+-------+----------------+-------------------+ +---------+--------+--+--------+--+---------+ VertebralPSV cm/s47EDV cm/s10Antegrade +---------+--------+--+--------+--+---------+  Left Carotid Findings: +----------+--------+--------+--------+--------+------------------+           PSV cm/sEDV cm/sStenosisDescribeComments           +----------+--------+--------+--------+--------+------------------+ CCA Prox  91      16                                         +----------+--------+--------+--------+--------+------------------+ CCA Distal93      19                                         +----------+--------+--------+--------+--------+------------------+ ICA Prox  66      16      Normal          intimal thickening +----------+--------+--------+--------+--------+------------------+ ICA Distal69      16                                         +----------+--------+--------+--------+--------+------------------+  ECA       96      6                                          +----------+--------+--------+--------+--------+------------------+ +----------+--------+--------+----------------+-------------------+ SubclavianPSV cm/sEDV cm/sDescribe        Arm Pressure (mmHG) +----------+--------+--------+----------------+-------------------+           97              Multiphasic, WNL                    +----------+--------+--------+----------------+-------------------+ +---------+--------+--+--------+--+---------+ VertebralPSV cm/s65EDV cm/s15Antegrade +---------+--------+--+--------+--+---------+  Summary: Right Carotid: The extracranial vessels were near-normal with only minimal wall                thickening or plaque. Left Carotid: The extracranial vessels were near-normal with only minimal wall                thickening or plaque.  *See table(s) above for measurements and observations.  Electronically signed by Deitra Mayo MD on 02/18/2018 at 5:45:47 AM.    Final    Ct Angio Abd/pel W/ And/or W/o  Result Date: 02/23/2018 CLINICAL DATA:  84 year old female with a history of preoperative evaluation for potential trans arterial aortic valve replacement EXAM: CT ANGIOGRAPHY CHEST, ABDOMEN AND PELVIS TECHNIQUE: Multidetector CT imaging through the chest, abdomen and pelvis was performed using the standard protocol during bolus administration of intravenous contrast. Multiplanar reconstructed images and MIPs were obtained and reviewed to evaluate the vascular anatomy. Gated cardiac study is also included, which has been interpreted and build by separate provider, under a separate accession number. CONTRAST:  2mL ISOVUE-370 IOPAMIDOL (ISOVUE-370) INJECTION 76% COMPARISON:  None. FINDINGS: CTA CHEST FINDINGS Cardiovascular: Heart: Borderline enlarged heart. No pericardial fluid/thickening. Calcified atherosclerosis of the left anterior descending coronary artery. Calcifications of the aortic valve and the mitral annulus. Diameter of the sino-tubular junction 2.5 cm. Greatest diameter of the ascending aorta measures 3.1 cm. Mild atherosclerotic changes of the aortic arch. Aberrant origin of the right subclavian artery. There is a common trunk of the right common carotid artery and the left common carotid artery. Separate origin of the left subclavian artery. No significant atherosclerotic changes at the origin of these vessels. Bilateral vertebral arteries and the bilateral common carotid artery are patent at the neck base. Dominant left vertebral artery. Left subclavian artery patent. Left axillary artery patent. No significant stenosis or significant atherosclerotic changes. Aorta: Mild atherosclerotic changes of the aortic arch. Proximal descending aorta measures 2.2 cm. No dissection. No periaortic fluid. No  aneurysm. Minimal tortuosity. Greatest diameter just above the aortic hiatus measures 19 mm. Pulmonary arteries: Contrast bolus timing is not optimized for evaluation of the pulmonary arteries. Main pulmonary artery measures 2.5 cm. Mediastinum/Nodes: Circumferential thickening of the length of the thoracic esophagus. Multiple lymph nodes throughout the mediastinum, some of which are calcified, predominantly right-sided. This includes right lowest paratracheal right hilar, and right peer bronchial. Unremarkable appearance of the thoracic inlet. Lungs/Pleura: Azygos lobe. Centrilobular emphysema. Calcified granuloma of the right upper lobe on image 37 of series 15. Minimal pattern of centrilobular nodularity at the periphery of the left upper lobe. No confluent airspace disease. No endotracheal or endobronchial debris. No peribronchial thickening. No pleural effusion or pneumothorax. Musculoskeletal: Surgical changes of bilateral glenohumeral joint arthroplasty. No acute displaced fracture identified. No aggressive sclerotic or lucent bony  lesions. Flowing anterior osteophyte production of the thoracic spine. No acute displaced fracture. Review of the MIP images confirms the above findings. CTA ABDOMEN AND PELVIS FINDINGS VASCULAR Aorta: Minimal atherosclerotic changes of the abdominal aorta. No aneurysm, dissection, stenosis, or periaortic fluid. Celiac: Minimal atherosclerotic changes at the celiac artery origin with no stenosis or occlusion. Branch vessels are patent. SMA: No significant atherosclerotic changes at the SMA origin. Branches are patent. Renals: Minimal atherosclerotic changes at the origin the bilateral renal arteries with no high-grade stenosis by CT imaging. Normal course caliber and contour of the bilateral renal arteries. The right renal artery is the lowest. IMA: IMA is patent. Right lower extremity: Relatively normal course caliber and contour of the right iliac arterial system with minimal  atherosclerotic change. No high-grade stenosis or occlusion. The smallest diameter of the right common iliac artery estimated 9 mm-10 mm. Hypogastric artery remains patent. External iliac artery is patent with the diameter approximately 8 mm. Common femoral artery without significant atherosclerotic changes. Diameter measures 9 mm. Proximal profunda femoris and SFA patent. Left lower extremity: Relatively normal course caliber and contour of the left iliac system without significant tortuosity, atherosclerotic changes, stenosis or occlusion. Hypogastric artery is patent. Smallest estimated diameter of the common iliac artery is 9 mm-10 mm. Saw list estimated diameter of the external iliac artery 7 mm. No significant atherosclerotic changes of the common femoral artery, with the diameter estimated 8 mm-9 mm. Proximal SFA and profunda femoris patent. Veins: Unremarkable appearance of the venous system. Review of the MIP images confirms the above findings. NON-VASCULAR Hepatobiliary: Unremarkable appearance of the liver. Cholecystectomy Pancreas: Unremarkable pancreas Spleen: Unremarkable. Adrenals/Urinary Tract: Unremarkable appearance of adrenal glands. Right: No hydronephrosis. Symmetric perfusion to the left. No nephrolithiasis. Unremarkable course of the right ureter. Left: No hydronephrosis. Symmetric perfusion to the right. No nephrolithiasis. Unremarkable course of the left ureter. Tiny gas at the anti dependent urinary bladder which is partially distended. No associated inflammation or urinary bladder wall thickening. Stomach/Bowel: Unremarkable appearance of the stomach. Unremarkable appearance of small bowel. No evidence of obstruction. Appendix is not visualized, however, no inflammatory changes are present adjacent to the cecum to indicate an appendicitis. Colonic diverticula without associated inflammation. Lymphatic: No adenopathy Mesenteric: No free fluid or air. No adenopathy. Reproductive:  Unremarkable appearance of uterus and adnexa Other: Small fat containing umbilical hernia Musculoskeletal: No acute displaced fracture. Degenerative changes of the lumbar spine. Grade 1 anterolisthesis of L4 on L5. Vacuum disc phenomenon of L5-S1. No significant bony canal narrowing. IMPRESSION: No acute finding. **An incidental finding of potential clinical significance has been found. There is long segment circumferential thickening of the thoracic esophagus, of uncertain significance. Differential includes inflammation as well as tumor. Correlation with history of symptoms of esophagitis/GERD, as well as possibly upper endoscopy recommended** Multiple mediastinal lymph nodes. Etiology uncertain, potentially related to chronic granulomatous disease, chronic inflammation from esophagitis/GER D, myeloproliferative disorder, or, if there is occult esophageal tumor, potentially mediastinal metastases. Mild atherosclerotic changes of the descending thoracic aorta and the abdominal aorta with no significant tortuosity, high-grade stenosis, or occlusion. Aortic Atherosclerosis (ICD10-I70.0). Minimal atherosclerotic changes of the bilateral iliac arteries with no significant stenosis, occlusion, or tortuosity. Smallest diameter on the right in the EIA estimated 8 mm, and on the left in the EIA estimated 7 mm. A right axillary/brachial trans arterial approach for deployment is precluded by the presence of aberrant origin of the right subclavian. Minimal pattern of centrilobular nodularity of the left upper lobe, potentially inflammatory/infectious  versus chronic changes of granulomatous disease or MAI. No evidence of lobar pneumonia or edema. Imaging diagnoses specific to the cardiac structures/left ventricular outflow tract have been performed and billed by a separate provider. Signed, Dulcy Fanny. Dellia Nims, RPVI [facility] Electronically Signed   By: Corrie Mckusick  D.O.   On: 02/23/2018 14:40   Disposition   Pt is being discharged home today in good condition.  Follow-up Plans & Appointments    Follow-up Information    Eileen Stanford, PA-C. Go on 03/12/2018.   Specialties:  Cardiology, Radiology Why:  @ 3:30pm, please arrive at least 10 minutes early  Contact information: [place] [address] [phone]          Discharge Instructions    Amb Referral to Cardiac Rehabilitation   Complete by:  As directed    Diagnosis:  Valve Replacement   Valve:  Tricuspid      Discharge Medications   Allergies as of 03/05/2018   No Known Allergies     Medication List    STOP taking these medications   amLODipine-valsartan 5-320 MG tablet Commonly known as:  EXFORGE   metoprolol tartrate 50 MG tablet Commonly known as:  LOPRESSOR     TAKE these medications   ACCU-CHEK AVIVA PLUS test strip Generic drug:  glucose blood 1 each by Other route 2 (two) times daily.   ACCU-CHEK SOFTCLIX LANCETS lancets 1 each by Other route 2 (two) times daily.   acetaminophen 500 MG tablet Commonly known as:  TYLENOL Take 500-1,000 mg by mouth every 6 (six) hours as needed (pain.).   aspirin 81 MG chewable tablet Chew 1 tablet (81 mg total) by mouth daily.   calcium-vitamin D 500-200 MG-UNIT tablet Commonly known as:  OSCAL WITH D Take 1 tablet by mouth daily at 12 noon.   clopidogrel 75 MG tablet Commonly known as:  PLAVIX Take 1 tablet (75 mg total) by mouth daily with breakfast. What changed:  when to take this   CRESTOR 5 MG tablet Generic drug:  rosuvastatin Take 5 mg by mouth daily.   econazole nitrate 1 % cream Apply 1 application topically daily. For nose/corners of mouth   Fish Oil 1200 MG Caps Take 1,200 mg by mouth daily at 12 noon.   gabapentin 100 MG capsule Commonly known as:  NEURONTIN Take 1 capsule (100 mg total) by mouth at bedtime.   glimepiride 2 MG tablet Commonly known as:   AMARYL Take 2-4 mg by mouth See admin instructions. Take 2 mg by mouth in the morning and4 mg by mouth at night.   hydrocortisone 2.5 % cream Apply 1 application topically daily. Applied to nose/corners of mouth   ketotifen 0.025 % ophthalmic solution Commonly known as:  ZADITOR Place 1 drop into both eyes 2 (two) times daily as needed (for itchy eyes.).   multivitamin with minerals Tabs tablet Take 1 tablet by mouth daily at 12 noon.   MYRBETRIQ 50 MG Tb24 tablet Generic drug:  mirabegron ER Take 50 mg by mouth daily.   sitaGLIPtin 100 MG tablet Commonly known as:  JANUVIA Take 100 mg by mouth daily.   SYNTHROID 125 MCG tablet Generic drug:  levothyroxine Take 125 mcg by mouth daily before breakfast.   triamterene-hydrochlorothiazide 37.5-25 MG capsule Commonly known as:  DYAZIDE Take 1 capsule by mouth at bedtime.         Outstanding Labs/Studies   BMET  Duration  of Discharge Encounter   Greater than 30 minutes including physician time.  Mable Fill, PA-C 03/05/2018, 9:19 AM [phone]  Patient seen, examined. Available data reviewed. Agree with findings, assessment, and plan as outlined by Nell Range, PA-C.  On exam the patient is alert and oriented, in no distress.  Lungs are clear, heart is regular rate and rhythm with a 2/6 systolic ejection murmur at the right upper sternal border, no diastolic murmur, abdomen is soft and nontender, bilateral groin sites are clear, extremities have no edema.  The patient was planned for hospital discharge yesterday but she spiked a temperature of greater than 102 degrees.  She has had no localizing symptoms.  Her chest x-ray is reviewed and shows no acute abnormalities.  There is no leukocytosis.  I think she is stable for discharge today.  Her echocardiogram is been reviewed as per yesterday's note.  She is walking much better in the halls and states her breathing is improved significantly.  Postprocedural  instructions were reviewed with the patient.  Follow-up as arranged as outlined above.  Sherren Mocha, M.D. 03/05/2018 1:12 PM

## 2018-03-06 ENCOUNTER — Telehealth: Payer: Self-pay

## 2018-03-06 NOTE — Telephone Encounter (Signed)
Patient contacted regarding discharge from [facility] on 03/05/2018.  Patient understands to follow up with provider Nell Range PA-C on 03/12/2018 at 3:30 PM at [facility] location. Patient understands discharge instructions? yes Patient understands medications and regiment? yes Patient understands to bring all medications to this visit? yes

## 2018-03-09 MED FILL — Potassium Chloride Inj 2 mEq/ML: INTRAVENOUS | Qty: 40 | Status: AC

## 2018-03-09 MED FILL — Heparin Sodium (Porcine) Inj 1000 Unit/ML: INTRAMUSCULAR | Qty: 30 | Status: AC

## 2018-03-09 MED FILL — Magnesium Sulfate Inj 50%: INTRAMUSCULAR | Qty: 10 | Status: AC

## 2018-03-12 ENCOUNTER — Encounter: Payer: Self-pay | Admitting: Physician Assistant

## 2018-03-12 ENCOUNTER — Other Ambulatory Visit: Payer: Self-pay | Admitting: Physician Assistant

## 2018-03-12 ENCOUNTER — Ambulatory Visit: Payer: Medicare Other | Admitting: Physician Assistant

## 2018-03-12 ENCOUNTER — Telehealth (HOSPITAL_COMMUNITY): Payer: Self-pay

## 2018-03-12 VITALS — BP 136/76 | HR 84 | Ht 64.0 in | Wt 193.0 lb

## 2018-03-12 DIAGNOSIS — K228 Other specified diseases of esophagus: Secondary | ICD-10-CM

## 2018-03-12 DIAGNOSIS — K2289 Other specified disease of esophagus: Secondary | ICD-10-CM

## 2018-03-12 DIAGNOSIS — I10 Essential (primary) hypertension: Secondary | ICD-10-CM

## 2018-03-12 DIAGNOSIS — N898 Other specified noninflammatory disorders of vagina: Secondary | ICD-10-CM

## 2018-03-12 DIAGNOSIS — Z952 Presence of prosthetic heart valve: Secondary | ICD-10-CM

## 2018-03-12 MED ORDER — FLUCONAZOLE 150 MG PO TABS: 150.0000 mg | ORAL_TABLET | Freq: Once | ORAL | 0 refills | Status: AC

## 2018-03-12 NOTE — Telephone Encounter (Signed)
Pt insurance is active and benefits verified through [facility]. Co-pay $20.00, DED $0.00/$0.00 met, out of pocket $3,600.00/$805.00 met, co-insurance 0%. No pre-authorization required. Passport, 03/12/2018 @ 2:54PM, REF# [id number]  Will contact patient to see if she is interested in the Cardiac Rehab Program. If interested, patient will need to complete follow up appt. Once completed, patient will be contacted for scheduling upon review by the RN Navigator.

## 2018-03-12 NOTE — Patient Instructions (Addendum)
Medication Instructions:  1) you have been given a prescription for DIFLUCAN 150 mg to take once.  Labwork: None  Testing/Procedures: None  Follow-Up: You have been referred to GI. You have an appointment on 04/09/2018 at 9:45AM with Dr. Collene Mares.  Please keep your upcoming appointments! You have an appointment

## 2018-03-12 NOTE — Progress Notes (Signed)
[facility] [facility]                                       Cardiology Office Note    Date:  03/13/2018   ID:  APRYL [name], DOB 10-23-1934, MRN [id number]  PCP:  Merrilee Seashore, MD  Cardiologist: Jenkins Rouge, MD /Dr. Burt Knack & Dr. Cyndia Bent (TAVR)  CC: TOC s/p TAVR   History of Present Illness:  Megan [name] is a 84 y.o. female with a history of DMT2, RBBB, HTN, hypothyroidism and severe AS s/p TAVR (03/03/18) who presents to clinic for follow up.   Patient had an episode ofdizziness in the past and was referred to neurology due to concerns about the possibility of having a stroke. MRI negative for CVA. She has been on plavix since that time.She was noted to have a heart murmur on exam and was referred to Dr. Johnsie Cancel in 2018. An echocardiogram in December 2018 showed moderate aortic stenosis.A follow-up echocardiogram on 01/23/2018 showed progression of her aortic stenosis to severe with a mean gradient of 39 mmHg and a peak gradient of 67 mmHg. The valve area was measured at 0.68 cm with a dimensionless index of 0.24. Left ventricular systolic function remainednormal. She reportedthat she haddeveloped exertional fatigue and shortness of breath that occurs with any significant work around the house or walking long distances. L/RHC confirmed severe AS with normal cors.  successful TAVR with a30mm Edwards Sapien 3 THV via the TF approach on 03/03/18. Post operative echoshowed EF 65%, normally functioning TAVR withmildly elevatedmean gradient of 20mm Hg and no PVL. Plan was for DC on POD1 but she developed a fever of 102 and was kept one more day for observation. She was discharged home the following day on ASA and plavix. Her BP was well controlled and she admitted to some hypotension at home. I stopped her Exforge 5-320mg  and asked her to keep a log of her BPs to bring to the office   Today she presents to clinic for follow up. Feeling  much better. Exercising on her own. She is up to 15 minutes twice a day but feels like she could do more. No CP or SOB. No LE edema, orthopnea or PND. No dizziness or syncope. No blood in stool or urine. No palpitations. Review of BP logs show good control of BP with ave 130/70 mm Hg.    Past Medical History:  Diagnosis Date  . Anemia   . Arthritis   . Cancer ([place]) 1989   melanoma  . Diabetes mellitus without complication ([place])   . Dizziness   . GERD (gastroesophageal reflux disease)   . History of hiatal hernia   . Hypertension   . Hypothyroidism   . IBS (irritable bowel syndrome)   . S/P TAVR (transcatheter aortic valve replacement) 03/03/2018   s/p 23 mm Edwards Sapien THV via the TF approach  . Severe aortic stenosis     Past Surgical History:  Procedure Laterality Date  . APPENDECTOMY     84 yrs old  . CHOLECYSTECTOMY  89  . COLONOSCOPY    . EYE SURGERY Bilateral 2017   cataract surgery with lens implants  . JOINT REPLACEMENT Right    knee   . REVERSE SHOULDER ARTHROPLASTY Left 07/14/2014  . REVERSE SHOULDER ARTHROPLASTY Left 07/14/2014   Procedure: LEFT REVERSE SHOULDER ARTHROPLASTY;  Surgeon:  Meredith Pel, MD;  Location: [facility];  Service: Orthopedics;  Laterality: Left;  . RIGHT/LEFT HEART CATH AND CORONARY ANGIOGRAPHY N/A 01/29/2018   Procedure: RIGHT/LEFT HEART CATH AND CORONARY ANGIOGRAPHY;  Surgeon: Belva Crome, MD;  Location: [facility];  Service: Cardiovascular;  Laterality: N/A;  . SHOULDER ARTHROSCOPY Left   . TEE WITHOUT CARDIOVERSION N/A 03/03/2018   Procedure: TRANSESOPHAGEAL ECHOCARDIOGRAM (TEE);  Surgeon: Sherren Mocha, MD;  Location: [facility];  Service: Open Heart Surgery;  Laterality: N/A;  . TOTAL SHOULDER REVISION Right 10/29/2016   Procedure: RIGHT REVERSE TOTAL SHOULDER;  Surgeon: Meredith Pel, MD;  Location: [facility];  Service: Orthopedics;  Laterality: Right;  . TRANSCATHETER AORTIC VALVE REPLACEMENT, TRANSFEMORAL N/A  03/03/2018   Procedure: TRANSCATHETER AORTIC VALVE REPLACEMENT, TRANSFEMORAL;  Surgeon: Sherren Mocha, MD;  Location: [facility];  Service: Open Heart Surgery;  Laterality: N/A;    Current Medications: Outpatient Medications Prior to Visit  Medication Sig Dispense Refill  . ACCU-CHEK AVIVA PLUS test strip 1 each by Other route 2 (two) times daily.     Marland Kitchen ACCU-CHEK SOFTCLIX LANCETS lancets 1 each by Other route 2 (two) times daily.     Marland Kitchen acetaminophen (TYLENOL) 500 MG tablet Take 500-1,000 mg by mouth every 6 (six) hours as needed (pain.).    Marland Kitchen aspirin 81 MG chewable tablet Chew 1 tablet (81 mg total) by mouth daily.    . calcium-vitamin D (OSCAL WITH D) 500-200 MG-UNIT tablet Take 1 tablet by mouth daily at 12 noon.     . clopidogrel (PLAVIX) 75 MG tablet Take 1 tablet (75 mg total) by mouth daily with breakfast. 90 tablet 1  . econazole nitrate 1 % cream Apply 1 application topically daily. For nose/corners of mouth    . gabapentin (NEURONTIN) 100 MG capsule Take 1 capsule (100 mg total) by mouth at bedtime. 180 capsule 2  . glimepiride (AMARYL) 2 MG tablet Take 2-4 mg by mouth See admin instructions. Take 2 mg by mouth in the morning and4 mg by mouth at night.  12  . hydrocortisone 2.5 % cream Apply 1 application topically daily. Applied to nose/corners of mouth    . Multiple Vitamin (MULTIVITAMIN WITH MINERALS) TABS tablet Take 1 tablet by mouth daily at 12 noon.     Marland Kitchen MYRBETRIQ 50 MG TB24 tablet Take 50 mg by mouth daily.  11  . Omega-3 Fatty Acids (FISH OIL) 1200 MG CAPS Take 1,200 mg by mouth daily at 12 noon.     . rosuvastatin (CRESTOR) 5 MG tablet Take 5 mg by mouth daily.    . sitaGLIPtin (JANUVIA) 100 MG tablet Take 100 mg by mouth daily.    Marland Kitchen SYNTHROID 125 MCG tablet Take 125 mcg by mouth daily before breakfast.   12  . triamterene-hydrochlorothiazide (DYAZIDE) 37.5-25 MG per capsule Take 1 capsule by mouth at bedtime.   11  . ketotifen (ZADITOR) 0.025 % ophthalmic solution  Place 1 drop into both eyes 2 (two) times daily as needed (for itchy eyes.).     No facility-administered medications prior to visit.      Allergies:   Patient has no known allergies.   Social History   Socioeconomic History  . Marital status: Widowed    Spouse name: Not on file  . Number of children: Not on file  . Years of education: Not on file  . Highest education level: Not on file  Occupational History  . Not on file  Social Needs  . Financial resource strain: Not on file  . Food insecurity:    Worry: Not on file    Inability: Not on file  . Transportation needs:    Medical: Not on file    Non-medical: Not on file  Tobacco Use  . Smoking status: Never Smoker  . Smokeless tobacco: Never Used  Substance and Sexual Activity  . Alcohol use: No  . Drug use: No  . Sexual activity: Not on file  Lifestyle  . Physical activity:    Days per week: Not on file    Minutes per session: Not on file  . Stress: Not on file  Relationships  . Social connections:    Talks on phone: Not on file    Gets together: Not on file    Attends religious service: Not on file    Active member of club or organization: Not on file    Attends meetings of clubs or organizations: Not on file    Relationship status: Not on file  Other Topics Concern  . Not on file  Social History Narrative  . Not on file     Family History:  The patient's family history includes Heart disease in her brother and father; Hypertension in her father and mother.      ROS:   Please see the history of present illness.    ROS All other systems reviewed and are negative.   PHYSICAL EXAM:   VS:  BP 136/76   Pulse 84   Ht 5\' 4"  (1.626 m)   Wt 193 lb (87.5 kg)   LMP  (LMP Unknown)   SpO2 95%   BMI 33.13 kg/m    GEN: Well nourished, well developed, in no acute distress HEENT: normal Neck: no JVD or masses Cardiac: RRR; soft flow murmur. No rubs, or gallops,no edema  Respiratory:  clear to auscultation  bilaterally, normal work of breathing GI: soft, nontender, nondistended, + BS MS: no deformity or atrophy Skin: warm and dry, no rash. Groin site with no hematoma or ecchymosis  Neuro:  Alert and Oriented x 3, Strength and sensation are intact Psych: euthymic mood, full affect   Wt Readings from Last 3 Encounters:  03/12/18 193 lb (87.5 kg)  03/05/18 194 lb 14.4 oz (88.4 kg)  03/02/18 194 lb 11.2 oz (88.3 kg)      Studies/Labs Reviewed:   EKG:  EKG is ordered today.  The ekg ordered today demonstrates sinus with PAC, old RBBB  Recent Labs: 03/02/2018: ALT 10; B Natriuretic Peptide 52.4 03/04/2018: Magnesium 1.7 03/05/2018: BUN 10; Creatinine, Ser 0.86; Hemoglobin 10.4; Platelets 206; Potassium 3.1; Sodium 139   Lipid Panel No results found for: CHOL, TRIG, HDL, CHOLHDL, VLDL, LDLCALC, LDLDIRECT  Additional studies/ records that were reviewed today include:  TAVR OPERATIVE NOTE   Date of Procedure:03/03/2018  Preoperative Diagnosis:Severe Aortic Stenosis   Postoperative Diagnosis:Same   Procedure:   Transcatheter Aortic Valve Replacement - PercutaneousRightTransfemoral Approach Edwards Sapien 3 THV (size 73mm, model # 9600TFX, serial # [id number])  Co-Surgeons:Bryan Alveria Apley, MD and Sherren Mocha, MD   Anesthesiologist:Stephen Gifford Shave, MD  Anne Hahn, MD  Pre-operative Echo Findings: ? Severe aortic stenosis ? Normalleft ventricular systolic function  Post-operative Echo Findings: ? Noparavalvular leak ? Normalleft ventricular systolic function  _______________  Echo 03/04/18: IMPRESSIONS 1. The left ventricle has hyperdynamic systolic function of >42%. The cavity size is normal. There is no increased left ventricular wall thickness. Echo evidence of impaired diastolic relaxation. 2.  The right ventricle has normal systolic function.  The cavity in normal in size. There is no increase in right ventricular wall thickness. Right ventricular systolic pressure is mildly elevated with an estimated pressure of 43.0 mmHg. 3. The mitral valve is normal in structure There is moderate thickening and moderate calcification. There is moderate mitral annular calcification present. moderate mitral valve stenosis. 4. A 80mm Edwards Sapien bioprosthetic aortic valve valve is present in the aortic position. Procedure Date: 03/03/2018 Normal aortic valve prosthesis. 5. There is moderate thickening and severe calcifcation of the aortic valve. 6. TAVR mean gradient mildly increased (36mmHg). Valve is structurally stable without significant regurgitation or stenosis. 7. The inferior vena cava was dilated in size with >50% respiratory variability    ASSESSMENT & PLAN:   Severe AS s/p TAVR: doing well. She can tell a big difference in how she feels s/p TAVR. Groin sites are healing well. SBE prophylaxis discussed. Continue Aspirin and plavix. I will see her back next month for follow up and repeat echo.   HTN: BP under good control today. Reivew of home log shows excellent control. I have recommended she stay off the Exforge for now.   Incidental findings: pre TAVR CT angio showedlong segment on circumferential thickening of thoracic esophagus. DDx: inflammation, tumor. May need EGD. Multiple mediastinal lymph nodes. DDx: chronic granulomatous disease, chronic inflammation from esophagitis/GERD, myeloproliferative disorder, or, if there is occult esophageal tumor, potentially mediastinal metastases." She does have a history of GERD with hiatal hernia. We have discussed these findings. She has seen Dr. Collene Mares with GI in the past. We have called to make her an appointment with her on 3/12.  Vaginal itching: she tried monistat with some relief. Will call in diflucan 150mg  x1.   Medication Adjustments/Labs and Tests Ordered: Current medicines are  reviewed at length with the patient today.  Concerns regarding medicines are outlined above.  Medication changes, Labs and Tests ordered today are listed in the Patient Instructions below. Patient Instructions  Medication Instructions:  1) you have been given a prescription for DIFLUCAN 150 mg to take once.  Labwork: None  Testing/Procedures: None  Follow-Up: You have been referred to GI. You have an appointment on 04/09/2018 at 9:45AM with Dr. Collene Mares.  Please keep your upcoming appointments! You have an appointment     Signed, Angelena Form, PA-C  03/13/2018 1:47 PM    [address], [place], [place]  [address] Phone: [phone]; Fax: [phone]

## 2018-03-16 DIAGNOSIS — H35039 Hypertensive retinopathy, unspecified eye: Secondary | ICD-10-CM | POA: Diagnosis not present

## 2018-03-16 DIAGNOSIS — E118 Type 2 diabetes mellitus with unspecified complications: Secondary | ICD-10-CM | POA: Diagnosis not present

## 2018-03-16 DIAGNOSIS — E782 Mixed hyperlipidemia: Secondary | ICD-10-CM | POA: Diagnosis not present

## 2018-03-16 DIAGNOSIS — E039 Hypothyroidism, unspecified: Secondary | ICD-10-CM | POA: Diagnosis not present

## 2018-03-16 DIAGNOSIS — I10 Essential (primary) hypertension: Secondary | ICD-10-CM | POA: Diagnosis not present

## 2018-03-23 ENCOUNTER — Encounter: Payer: Self-pay | Admitting: Emergency Medicine

## 2018-03-23 ENCOUNTER — Ambulatory Visit: Payer: Medicare Other | Admitting: Emergency Medicine

## 2018-03-23 DIAGNOSIS — R0609 Other forms of dyspnea: Secondary | ICD-10-CM | POA: Diagnosis not present

## 2018-03-23 DIAGNOSIS — R0600 Dyspnea, unspecified: Secondary | ICD-10-CM

## 2018-03-23 DIAGNOSIS — I2729 Other secondary pulmonary hypertension: Secondary | ICD-10-CM

## 2018-03-23 NOTE — Assessment & Plan Note (Signed)
Suggested by her postoperative echocardiogram.  Etiology most likely due to her left-sided heart disease which should be improved post valve surgery.  She does report a history of mild sleep apnea, I do not have the data here right now.  She is not on CPAP, has a goal of losing weight.  I think we need to rule out occult hypoxemia today.  Unclear that other work-up is relevant right now including autoimmune evaluation, thromboembolic disease evaluation, etc.  If her pulmonary pressures remain elevated as she recovers from valve procedure then we may reconsider expanding the evaluation.  Note that her pulmonary pressures could stay elevated even after the valve is fixed if there is been some pulmonary vascular remodeling.

## 2018-03-23 NOTE — Assessment & Plan Note (Signed)
Her exercise tolerance and energy level have improved significantly since she had her valve replacement.  I suspect that her dyspnea was driven most by this issue.  She did have a very mild diffusion defect on pulmonary function testing with otherwise normal airflows and lung capacity (corrected DLCO 77% predicted).  This could be spurious but if accurate then may relate to her mild secondary pulmonary hypertension.  I think would be reasonable to follow her now that the valve is been replaced, she is increasing her exercise.  Consider work-up of her PAH as below

## 2018-03-23 NOTE — Patient Instructions (Signed)
We will perform a walking oxygen test today to ensure good oxygen levels. Get your echocardiogram next month as planned Agree with slowly increasing your exercise regimen Follow-up in 6 months.  At that time depending on your functional capacity, breathing, we will decide if any other testing will be helpful.

## 2018-03-23 NOTE — Progress Notes (Signed)
Subjective:    Patient ID: Megan [name], female    DOB: 1934/09/05, 84 y.o.   MRN: [id number]  HPI 84 year old never smoker with a history of diabetes, GERD with hiatal hernia, IBS, hypothyroidism.  She just underwent TAVR valve replacement for severe aortic stenosis on 03/03/2018.  Hospital and cardiology notes reviewed. She had a postoperative echocardiogram that showed an intact ejection fraction 65%, good valvular function (mean gradient 19 mmHg).  Right ventricular size and function normal, mildly estimated RVSP 43 mmHg.  She is referred today for evaluation of dyspnea. She is doing well post-op. Has been walking per rec's > 15 min / day x 2, planning to up-titrate. Her energy level is improving. She is not having any dyspnea with exertion right now. She does not wheeze, does have a cough that is worse at night, sometimes relates to her GERD sx. She has been told that she snores, had PSG last year that showed only mild OSA although I do not see that study in the record- not currently on CPAP.   Pulmonary function testing on 02/17/2018 reviewed, shows normal airflows without a bronchodilator response, normal lung volumes and a decreased diffusion capacity that does not completely correct when adjusted for alveolar volume (corrects to 77% predicted).  Chest x-ray 03/04/2018 reviewed, shows normal cardiac size, no consolidation or infiltrates, mild blunting of the left costophrenic angle suggestive of a small effusion.  There is calcified granuloma in the right midlung.   Review of Systems  Constitutional: Negative for unexpected weight change.  HENT: Positive for ear pain. Negative for congestion, dental problem, nosebleeds, postnasal drip, rhinorrhea, sinus pressure, sneezing, sore throat and trouble swallowing.   Eyes: Positive for itching. Negative for redness.  Respiratory: Positive for cough and shortness of breath. Negative for chest tightness and wheezing.   Cardiovascular: Negative for  palpitations and leg swelling.  Gastrointestinal: Negative for nausea and vomiting.  Genitourinary: Negative for dysuria.  Musculoskeletal: Negative for joint swelling.  Skin: Negative for rash.  Allergic/Immunologic: Negative.  Negative for environmental allergies, food allergies and immunocompromised state.  Neurological: Negative for headaches.  Hematological: Does not bruise/bleed easily.  Psychiatric/Behavioral: Negative for dysphoric mood. The patient is not nervous/anxious.    Past Medical History:  Diagnosis Date  . Anemia   . Arthritis   . Cancer ([place]) 1989   melanoma  . Diabetes mellitus without complication ([place])   . Dizziness   . GERD (gastroesophageal reflux disease)   . History of hiatal hernia   . Hypertension   . Hypothyroidism   . IBS (irritable bowel syndrome)   . S/P TAVR (transcatheter aortic valve replacement) 03/03/2018   s/p 23 mm Edwards Sapien THV via the TF approach  . Severe aortic stenosis      Family History  Problem Relation Age of Onset  . Hypertension Mother   . Hypertension Father   . Heart disease Father   . Heart disease Brother      Social History   Socioeconomic History  . Marital status: Widowed    Spouse name: Not on file  . Number of children: Not on file  . Years of education: Not on file  . Highest education level: Not on file  Occupational History  . Not on file  Social Needs  . Financial resource strain: Not on file  . Food insecurity:    Worry: Not on file    Inability: Not on file  . Transportation needs:    Medical: Not on  file    Non-medical: Not on file  Tobacco Use  . Smoking status: Never Smoker  . Smokeless tobacco: Never Used  Substance and Sexual Activity  . Alcohol use: No  . Drug use: No  . Sexual activity: Not on file  Lifestyle  . Physical activity:    Days per week: Not on file    Minutes per session: Not on file  . Stress: Not on file  Relationships  . Social connections:    Talks on phone:  Not on file    Gets together: Not on file    Attends religious service: Not on file    Active member of club or organization: Not on file    Attends meetings of clubs or organizations: Not on file    Relationship status: Not on file  . Intimate partner violence:    Fear of current or ex partner: Not on file    Emotionally abused: Not on file    Physically abused: Not on file    Forced sexual activity: Not on file  Other Topics Concern  . Not on file  Social History Narrative  . Not on file     No Known Allergies   Outpatient Medications Prior to Visit  Medication Sig Dispense Refill  . ACCU-CHEK AVIVA PLUS test strip 1 each by Other route 2 (two) times daily.     Marland Kitchen ACCU-CHEK SOFTCLIX LANCETS lancets 1 each by Other route 2 (two) times daily.     Marland Kitchen acetaminophen (TYLENOL) 500 MG tablet Take 500-1,000 mg by mouth every 6 (six) hours as needed (pain.).    Marland Kitchen aspirin 81 MG chewable tablet Chew 1 tablet (81 mg total) by mouth daily.    . calcium-vitamin D (OSCAL WITH D) 500-200 MG-UNIT tablet Take 1 tablet by mouth daily at 12 noon.     . clopidogrel (PLAVIX) 75 MG tablet Take 1 tablet (75 mg total) by mouth daily with breakfast. 90 tablet 1  . econazole nitrate 1 % cream Apply 1 application topically daily. For nose/corners of mouth    . gabapentin (NEURONTIN) 100 MG capsule Take 1 capsule (100 mg total) by mouth at bedtime. 180 capsule 2  . glimepiride (AMARYL) 2 MG tablet Take 2-4 mg by mouth See admin instructions. Take 2 mg by mouth in the morning and4 mg by mouth at night.  12  . hydrocortisone 2.5 % cream Apply 1 application topically daily. Applied to nose/corners of mouth    . Multiple Vitamin (MULTIVITAMIN WITH MINERALS) TABS tablet Take 1 tablet by mouth daily at 12 noon.     Marland Kitchen MYRBETRIQ 50 MG TB24 tablet Take 50 mg by mouth daily.  11  . Omega-3 Fatty Acids (FISH OIL) 1200 MG CAPS Take 1,200 mg by mouth daily at 12 noon.     . rosuvastatin (CRESTOR) 5 MG tablet Take 5 mg by  mouth daily.    . sitaGLIPtin (JANUVIA) 100 MG tablet Take 100 mg by mouth daily.    Marland Kitchen SYNTHROID 125 MCG tablet Take 125 mcg by mouth daily before breakfast.   12  . triamterene-hydrochlorothiazide (DYAZIDE) 37.5-25 MG per capsule Take 1 capsule by mouth at bedtime.   11   No facility-administered medications prior to visit.         Objective:   Physical Exam Vitals:   03/23/18 0901  BP: (!) 142/70  Pulse: 98  SpO2: 95%  Weight: 189 lb 12.8 oz (86.1 kg)  Height: 5\' 4"  (1.626 m)  Gen: Pleasant, well-nourished, in no distress,  normal affect  ENT: No lesions,  mouth clear,  oropharynx clear, no postnasal drip  Neck: No JVD, no stridor  Lungs: No use of accessory muscles, no crackles or wheezing on normal respiration, no wheeze on forced expiration  Cardiovascular: Regular, soft holosystolic murmur with sharp intact S2  Musculoskeletal: No deformities, no cyanosis or clubbing  Neuro: alert, awake, non focal  Skin: Warm, no lesions or rash     Assessment & Plan:  Dyspnea on exertion Her exercise tolerance and energy level have improved significantly since she had her valve replacement.  I suspect that her dyspnea was driven most by this issue.  She did have a very mild diffusion defect on pulmonary function testing with otherwise normal airflows and lung capacity (corrected DLCO 77% predicted).  This could be spurious but if accurate then may relate to her mild secondary pulmonary hypertension.  I think would be reasonable to follow her now that the valve is been replaced, she is increasing her exercise.  Consider work-up of her PAH as below  Other secondary pulmonary hypertension ([place]) Suggested by her postoperative echocardiogram.  Etiology most likely due to her left-sided heart disease which should be improved post valve surgery.  She does report a history of mild sleep apnea, I do not have the data here right now.  She is not on CPAP, has a goal of losing weight.  I think  we need to rule out occult hypoxemia today.  Unclear that other work-up is relevant right now including autoimmune evaluation, thromboembolic disease evaluation, etc.  If her pulmonary pressures remain elevated as she recovers from valve procedure then we may reconsider expanding the evaluation.  Note that her pulmonary pressures could stay elevated even after the valve is fixed if there is been some pulmonary vascular remodeling.  Baltazar Apo, MD, PhD 03/23/2018, 9:40 AM [facility] [phone] or if no answer [phone]

## 2018-03-24 NOTE — Telephone Encounter (Signed)
Called patient to see if she is interested in the Cardiac Rehab Program. Patient stated not at this time, she is currently exercising at home and walking.  Closed referral

## 2018-03-26 ENCOUNTER — Other Ambulatory Visit: Payer: Self-pay | Admitting: Internal Medicine

## 2018-03-26 DIAGNOSIS — Z1231 Encounter for screening mammogram for malignant neoplasm of breast: Secondary | ICD-10-CM

## 2018-04-08 NOTE — Progress Notes (Signed)
[facility]                                       Cardiology Office Note    Date:  04/10/2018   ID:  Megan [name], DOB [birth year], MRN [id number]  PCP:  Merrilee Seashore, MD  Cardiologist: Jenkins Rouge, MD /Dr. Burt Knack & Dr. Cyndia Bent (TAVR)  CC: 1 month s/p TAVR   History of Present Illness:  Megan [name] is a 84 y.o. female with a history of DMT2, RBBB, HTN, hypothyroidism and severe AS s/p TAVR (03/03/18) who presents to clinic for follow up.   Patient had an episode ofdizziness in the past and was referred to neurology due to concerns about the possibility of having a stroke. MRI negative for CVA. She has been on plavix since that time.She was noted to have a heart murmur on exam and was referred to Dr. Johnsie Cancel in 2018. An echocardiogram in December 2018 showed moderate aortic stenosis.A follow-up echocardiogram on 01/23/2018 showed progression of her aortic stenosis to severe with a mean gradient of 39 mmHg and a peak gradient of 67 mmHg. The valve area was measured at 0.68 cm with a dimensionless index of 0.24. Left ventricular systolic function remainednormal. She reportedthat she haddeveloped exertional fatigue and shortness of breath that occurs with any significant work around the house or walking long distances. L/RHC confirmed severe AS with normal cors.  She underwent successful TAVR with a56mm Edwards Sapien 3 THV via the TF approach on 03/03/18. Post operative echoshowed EF 65%, normally functioning TAVR withmildly elevatedmean gradient of 43mm Hg and no PVL.  She was discharged home on ASA and plavix. She has had a lot of improvement in her symptoms since her TAVR. She was recently seen by Dr. Lamonte Sakai in the office for evaluation of pulmonary HTN. He felt it was mostly related to left sided heart disease related to her severe AS and should hopefully improve now that she has had TAVR.  Today she presents to clinic for  follow up. She has had a big improvement in her exercise tolerance, dizziness and fatigue. No significant shortness of breath. No CP.  No LE edema, orthopnea or PND. No dizziness or syncope. No blood in stool or urine. No palpitations.     Past Medical History:  Diagnosis Date  . Anemia   . Arthritis   . Cancer ([place]) 1989   melanoma  . Diabetes mellitus without complication ([place])   . Dizziness   . GERD (gastroesophageal reflux disease)   . History of hiatal hernia   . Hypertension   . Hypothyroidism   . IBS (irritable bowel syndrome)   . S/P TAVR (transcatheter aortic valve replacement) 03/03/2018   s/p 23 mm Edwards Sapien THV via the TF approach  . Severe aortic stenosis     Past Surgical History:  Procedure Laterality Date  . APPENDECTOMY     84 yrs old  . CHOLECYSTECTOMY  95  . COLONOSCOPY    . EYE SURGERY Bilateral 2017   cataract surgery with lens implants  . JOINT REPLACEMENT Right    knee   . REVERSE SHOULDER ARTHROPLASTY Left 07/14/2014  . REVERSE SHOULDER ARTHROPLASTY Left 07/14/2014   Procedure: LEFT REVERSE SHOULDER ARTHROPLASTY;  Surgeon: Meredith Pel, MD;  Location: [facility];  Service: Orthopedics;  Laterality: Left;  . RIGHT/LEFT HEART CATH  AND CORONARY ANGIOGRAPHY N/A 01/29/2018   Procedure: RIGHT/LEFT HEART CATH AND CORONARY ANGIOGRAPHY;  Surgeon: Belva Crome, MD;  Location: [facility];  Service: Cardiovascular;  Laterality: N/A;  . SHOULDER ARTHROSCOPY Left   . TEE WITHOUT CARDIOVERSION N/A 03/03/2018   Procedure: TRANSESOPHAGEAL ECHOCARDIOGRAM (TEE);  Surgeon: Sherren Mocha, MD;  Location: [facility];  Service: Open Heart Surgery;  Laterality: N/A;  . TOTAL SHOULDER REVISION Right 10/29/2016   Procedure: RIGHT REVERSE TOTAL SHOULDER;  Surgeon: Meredith Pel, MD;  Location: [facility];  Service: Orthopedics;  Laterality: Right;  . TRANSCATHETER AORTIC VALVE REPLACEMENT, TRANSFEMORAL N/A 03/03/2018   Procedure: TRANSCATHETER AORTIC VALVE  REPLACEMENT, TRANSFEMORAL;  Surgeon: Sherren Mocha, MD;  Location: [facility];  Service: Open Heart Surgery;  Laterality: N/A;    Current Medications: Outpatient Medications Prior to Visit  Medication Sig Dispense Refill  . ACCU-CHEK AVIVA PLUS test strip 1 each by Other route 2 (two) times daily.     Marland Kitchen ACCU-CHEK SOFTCLIX LANCETS lancets 1 each by Other route 2 (two) times daily.     Marland Kitchen acetaminophen (TYLENOL) 500 MG tablet Take 500-1,000 mg by mouth every 6 (six) hours as needed (pain.).    Marland Kitchen aspirin 81 MG chewable tablet Chew 1 tablet (81 mg total) by mouth daily.    . calcium-vitamin D (OSCAL WITH D) 500-200 MG-UNIT tablet Take 1 tablet by mouth daily at 12 noon.     . clopidogrel (PLAVIX) 75 MG tablet Take 1 tablet (75 mg total) by mouth daily with breakfast. 90 tablet 1  . econazole nitrate 1 % cream Apply 1 application topically daily. For nose/corners of mouth    . gabapentin (NEURONTIN) 100 MG capsule Take 1 capsule (100 mg total) by mouth at bedtime. 180 capsule 2  . glimepiride (AMARYL) 2 MG tablet Take 2-4 mg by mouth See admin instructions. Take 2 mg by mouth in the morning and4 mg by mouth at night.  12  . hydrocortisone 2.5 % cream Apply 1 application topically daily. Applied to nose/corners of mouth    . Multiple Vitamin (MULTIVITAMIN WITH MINERALS) TABS tablet Take 1 tablet by mouth daily at 12 noon.     Marland Kitchen MYRBETRIQ 50 MG TB24 tablet Take 50 mg by mouth daily.  11  . Omega-3 Fatty Acids (FISH OIL) 1200 MG CAPS Take 1,200 mg by mouth daily at 12 noon.     . rosuvastatin (CRESTOR) 5 MG tablet Take 5 mg by mouth daily.    . sitaGLIPtin (JANUVIA) 100 MG tablet Take 100 mg by mouth daily.    Marland Kitchen SYNTHROID 125 MCG tablet Take 125 mcg by mouth daily before breakfast.   12  . triamterene-hydrochlorothiazide (DYAZIDE) 37.5-25 MG per capsule Take 1 capsule by mouth at bedtime.   11   No facility-administered medications prior to visit.      Allergies:   Patient has no known  allergies.   Social History   Socioeconomic History  . Marital status: Widowed    Spouse name: Not on file  . Number of children: Not on file  . Years of education: Not on file  . Highest education level: Not on file  Occupational History  . Not on file  Social Needs  . Financial resource strain: Not on file  . Food insecurity:    Worry: Not on file    Inability: Not on file  . Transportation needs:    Medical: Not on file    Non-medical:  Not on file  Tobacco Use  . Smoking status: Never Smoker  . Smokeless tobacco: Never Used  Substance and Sexual Activity  . Alcohol use: No  . Drug use: No  . Sexual activity: Not on file  Lifestyle  . Physical activity:    Days per week: Not on file    Minutes per session: Not on file  . Stress: Not on file  Relationships  . Social connections:    Talks on phone: Not on file    Gets together: Not on file    Attends religious service: Not on file    Active member of club or organization: Not on file    Attends meetings of clubs or organizations: Not on file    Relationship status: Not on file  Other Topics Concern  . Not on file  Social History Narrative  . Not on file     Family History:  The patient's family history includes Heart disease in her brother and father; Hypertension in her father and mother.      ROS:   Please see the history of present illness.    ROS All other systems reviewed and are negative.   PHYSICAL EXAM:   VS:  BP 138/70   Pulse 81   Ht 5\' 4"  (1.626 m)   Wt 188 lb 1.9 oz (85.3 kg)   LMP  (LMP Unknown)   SpO2 94%   BMI 32.29 kg/m    GEN: Well nourished, well developed, in no acute distress HEENT: normal Neck: no JVD or masses Cardiac: RRR; soft flow murmur. No rubs, or gallops,no edema  Respiratory:  clear to auscultation bilaterally, normal work of breathing GI: soft, nontender, nondistended, + BS MS: no deformity or atrophy Skin: warm and dry, no rash.  Neuro:  Alert and Oriented x 3,  Strength and sensation are intact Psych: euthymic mood, full affect   Wt Readings from Last 3 Encounters:  04/09/18 188 lb 1.9 oz (85.3 kg)  03/23/18 189 lb 12.8 oz (86.1 kg)  03/12/18 193 lb (87.5 kg)      Studies/Labs Reviewed:   EKG:  EKG is NOT ordered today.    Recent Labs: 03/02/2018: ALT 10; B Natriuretic Peptide 52.4 03/04/2018: Magnesium 1.7 03/05/2018: BUN 10; Creatinine, Ser 0.86; Hemoglobin 10.4; Platelets 206; Potassium 3.1; Sodium 139   Lipid Panel No results found for: CHOL, TRIG, HDL, CHOLHDL, VLDL, LDLCALC, LDLDIRECT  Additional studies/ records that were reviewed today include:  TAVR OPERATIVE NOTE   Date of Procedure:03/03/2018  Preoperative Diagnosis:Severe Aortic Stenosis   Postoperative Diagnosis:Same   Procedure:   Transcatheter Aortic Valve Replacement - PercutaneousRightTransfemoral Approach Edwards Sapien 3 THV (size 66mm, model # 9600TFX, serial # [id number])  Co-Surgeons:Bryan Alveria Apley, MD and Sherren Mocha, MD   Anesthesiologist:Stephen Gifford Shave, MD  Anne Hahn, MD  Pre-operative Echo Findings: ? Severe aortic stenosis ? Normalleft ventricular systolic function  Post-operative Echo Findings: ? Noparavalvular leak ? Normalleft ventricular systolic function  _______________  Echo 03/04/18: IMPRESSIONS 1. The left ventricle has hyperdynamic systolic function of >58%. The cavity size is normal. There is no increased left ventricular wall thickness. Echo evidence of impaired diastolic relaxation. 2. The right ventricle has normal systolic function. The cavity in normal in size. There is no increase in right ventricular wall thickness. Right ventricular systolic pressure is mildly elevated with an estimated pressure of 43.0 mmHg. 3. The mitral valve is normal in structure There is moderate thickening and  moderate calcification. There is moderate mitral  annular calcification present. moderate mitral valve stenosis. 4. A 64mm Edwards Sapien bioprosthetic aortic valve valve is present in the aortic position. Procedure Date: 03/03/2018 Normal aortic valve prosthesis. 5. There is moderate thickening and severe calcifcation of the aortic valve. 6. TAVR mean gradient mildly increased (38mmHg). Valve is structurally stable without significant regurgitation or stenosis. 7. The inferior vena cava was dilated in size with >50% respiratory variability    ASSESSMENT & PLAN:   Severe AS s/p TAVR: echo today shows echo 65%, normally functioning TAVR with mean gradient of 18 mm Hg (improved from 28mm Hg on POD1 echo) and trivial PVL. She has NYHA class I symptoms. SBE prophylaxis discussed; she has dentures and doesn't go to the dentist.  Plavix can be discontinued after 6 months of therapy (08/2018)  HTN: BP starting to creep up. Will resume Exforge 5-320mg  but half a tablet. Continue Dyazide.   Incidental findings: pre TAVR CT angio showedlong segment on circumferential thickening of thoracic esophagus. DDx: inflammation, tumor. May need EGD. Multiple mediastinal lymph nodes. DDx: chronic granulomatous disease, chronic inflammation from esophagitis/GERD, myeloproliferative disorder, or, if there is occult esophageal tumor, potentially mediastinal metastases." She does have a history of GERD with hiatal hernia. She was seen by Dr. Collene Mares with GI this AM and she will get an EGD after she has completed 6 months of DAPT  Back pain: she wants to know if she can get an injection in her back for her pain. I asked her to talk to her doctor about whether it can be done saftely while on plavix. She will let us know.    Medication Adjustments/Labs and Tests Ordered: Current medicines are reviewed at length with the patient today.  Concerns regarding medicines are outlined above.  Medication changes, Labs and Tests  ordered today are listed in the Patient Instructions below. Patient Instructions  Medication Instructions:  1) STOP PLAVIX 09/10/2018 2) START EXFORGE 5-320mg . Take 1/2 tablet daily.   Follow-Up: You have an appointment with Dr. Johnsie Cancel on July 20, 2018 at 8:15AM.  You are scheduled for your 1 year TAVR echocardiogram and office visit on March 10, 2019. Please arrive by 1:30PM.    Signed, Angelena Form, PA-C  04/10/2018 4:33 AM    [address], [place], [place]  [address] Phone: [phone]; Fax: [phone]

## 2018-04-09 ENCOUNTER — Other Ambulatory Visit: Payer: Self-pay | Admitting: Physician Assistant

## 2018-04-09 ENCOUNTER — Ambulatory Visit: Payer: Medicare Other | Admitting: Physician Assistant

## 2018-04-09 ENCOUNTER — Other Ambulatory Visit: Payer: Self-pay

## 2018-04-09 ENCOUNTER — Ambulatory Visit (HOSPITAL_COMMUNITY): Payer: Medicare Other | Attending: Cardiology

## 2018-04-09 ENCOUNTER — Encounter: Payer: Self-pay | Admitting: Physician Assistant

## 2018-04-09 VITALS — BP 138/70 | HR 81 | Ht 64.0 in | Wt 188.1 lb

## 2018-04-09 DIAGNOSIS — K2289 Other specified disease of esophagus: Secondary | ICD-10-CM

## 2018-04-09 DIAGNOSIS — R1310 Dysphagia, unspecified: Secondary | ICD-10-CM | POA: Diagnosis not present

## 2018-04-09 DIAGNOSIS — Z952 Presence of prosthetic heart valve: Secondary | ICD-10-CM | POA: Diagnosis not present

## 2018-04-09 DIAGNOSIS — R933 Abnormal findings on diagnostic imaging of other parts of digestive tract: Secondary | ICD-10-CM | POA: Diagnosis not present

## 2018-04-09 DIAGNOSIS — K228 Other specified diseases of esophagus: Secondary | ICD-10-CM

## 2018-04-09 DIAGNOSIS — I10 Essential (primary) hypertension: Secondary | ICD-10-CM | POA: Diagnosis not present

## 2018-04-09 DIAGNOSIS — Z800 Family history of malignant neoplasm of digestive organs: Secondary | ICD-10-CM | POA: Diagnosis not present

## 2018-04-09 MED ORDER — AMLODIPINE BESYLATE-VALSARTAN 5-320 MG PO TABS: 0.5000 | ORAL_TABLET | Freq: Every day | ORAL | 11 refills | Status: DC

## 2018-04-09 NOTE — Patient Instructions (Addendum)
Medication Instructions:  1) STOP PLAVIX 09/10/2018 2) START EXFORGE 5-320mg . Take 1/2 tablet daily.   Follow-Up: You have an appointment with Dr. Johnsie Cancel on July 20, 2018 at 8:15AM.  You are scheduled for your 1 year TAVR echocardiogram and office visit on March 10, 2019. Please arrive by 1:30PM.

## 2018-04-10 ENCOUNTER — Other Ambulatory Visit: Payer: Self-pay | Admitting: Gastroenterology

## 2018-04-13 ENCOUNTER — Other Ambulatory Visit: Payer: Self-pay | Admitting: Gastroenterology

## 2018-04-13 DIAGNOSIS — R9389 Abnormal findings on diagnostic imaging of other specified body structures: Secondary | ICD-10-CM

## 2018-04-14 ENCOUNTER — Ambulatory Visit
Admission: RE | Admit: 2018-04-14 | Discharge: 2018-04-14 | Disposition: A | Discharge status: Home / Self Care | Payer: Medicare Other | Source: Ambulatory Visit | Attending: Gastroenterology | Admitting: Gastroenterology

## 2018-04-14 DIAGNOSIS — K219 Gastro-esophageal reflux disease without esophagitis: Secondary | ICD-10-CM | POA: Diagnosis not present

## 2018-04-14 DIAGNOSIS — R9389 Abnormal findings on diagnostic imaging of other specified body structures: Secondary | ICD-10-CM

## 2018-04-14 DIAGNOSIS — K449 Diaphragmatic hernia without obstruction or gangrene: Secondary | ICD-10-CM | POA: Diagnosis not present

## 2018-04-28 ENCOUNTER — Ambulatory Visit: Payer: Medicare Other

## 2018-04-28 DIAGNOSIS — I2510 Atherosclerotic heart disease of native coronary artery without angina pectoris: Secondary | ICD-10-CM | POA: Diagnosis not present

## 2018-04-28 DIAGNOSIS — Z0000 Encounter for general adult medical examination without abnormal findings: Secondary | ICD-10-CM | POA: Diagnosis not present

## 2018-04-28 DIAGNOSIS — E782 Mixed hyperlipidemia: Secondary | ICD-10-CM | POA: Diagnosis not present

## 2018-04-28 DIAGNOSIS — E1165 Type 2 diabetes mellitus with hyperglycemia: Secondary | ICD-10-CM | POA: Diagnosis not present

## 2018-04-28 DIAGNOSIS — I10 Essential (primary) hypertension: Secondary | ICD-10-CM | POA: Diagnosis not present

## 2018-04-28 DIAGNOSIS — E118 Type 2 diabetes mellitus with unspecified complications: Secondary | ICD-10-CM | POA: Diagnosis not present

## 2018-04-29 DIAGNOSIS — N390 Urinary tract infection, site not specified: Secondary | ICD-10-CM | POA: Diagnosis not present

## 2018-05-04 ENCOUNTER — Encounter: Payer: Self-pay | Admitting: Surgery

## 2018-05-29 ENCOUNTER — Ambulatory Visit: Payer: Medicare Other

## 2018-06-04 ENCOUNTER — Telehealth: Payer: Self-pay | Admitting: Physical Medicine and Rehabilitation

## 2018-06-04 NOTE — Telephone Encounter (Signed)
ok 

## 2018-06-05 NOTE — Telephone Encounter (Signed)
Is auth needed for bilateral [phone]? Scheduled for 6/8.

## 2018-06-08 NOTE — Telephone Encounter (Signed)
Notification or Prior Authorization is not required for the requested services  This [employer]vant[employer] members plan does not currently require a prior authorization for [phone]  Decision ID #:[id number]

## 2018-06-15 DIAGNOSIS — E782 Mixed hyperlipidemia: Secondary | ICD-10-CM | POA: Diagnosis not present

## 2018-06-15 DIAGNOSIS — E039 Hypothyroidism, unspecified: Secondary | ICD-10-CM | POA: Diagnosis not present

## 2018-06-15 DIAGNOSIS — E118 Type 2 diabetes mellitus with unspecified complications: Secondary | ICD-10-CM | POA: Diagnosis not present

## 2018-06-15 DIAGNOSIS — I10 Essential (primary) hypertension: Secondary | ICD-10-CM | POA: Diagnosis not present

## 2018-06-23 DIAGNOSIS — S139XXA Sprain of joints and ligaments of unspecified parts of neck, initial encounter: Secondary | ICD-10-CM | POA: Diagnosis not present

## 2018-06-23 DIAGNOSIS — Z7189 Other specified counseling: Secondary | ICD-10-CM | POA: Diagnosis not present

## 2018-07-06 ENCOUNTER — Ambulatory Visit (INDEPENDENT_AMBULATORY_CARE_PROVIDER_SITE_OTHER): Payer: Medicare Other | Admitting: Physical Medicine and Rehabilitation

## 2018-07-06 ENCOUNTER — Encounter: Payer: Self-pay | Admitting: Physical Medicine and Rehabilitation

## 2018-07-06 ENCOUNTER — Ambulatory Visit: Payer: Self-pay

## 2018-07-06 ENCOUNTER — Other Ambulatory Visit: Payer: Self-pay

## 2018-07-06 VITALS — BP 140/72 | HR 79

## 2018-07-06 DIAGNOSIS — M5416 Radiculopathy, lumbar region: Secondary | ICD-10-CM | POA: Diagnosis not present

## 2018-07-06 MED ORDER — BETAMETHASONE SOD PHOS & ACET 6 (3-3) MG/ML IJ SUSP: 12.0000 mg | Freq: Once | INTRAMUSCULAR | Status: DC

## 2018-07-06 NOTE — Progress Notes (Signed)
Numeric Pain Rating Scale and Functional Assessment Average Pain 10   In the last MONTH (on 0-10 scale) has pain interfered with the following?  1. General activity like being  able to carry out your everyday physical activities such as walking, climbing stairs, carrying groceries, or moving a chair?  Rating(5)  Previous injections helpful, 6 months.  plavix - did not stop.  +Driver, +BT, -Dye Allergies.

## 2018-07-13 ENCOUNTER — Telehealth: Payer: Self-pay

## 2018-07-13 NOTE — Telephone Encounter (Signed)
Virtual Visit Pre-Appointment Phone Call  "(Name), I am calling you today to discuss your upcoming appointment. We are currently trying to limit exposure to the virus that causes COVID-19 by seeing patients at home rather than in the office."  1. "What is the BEST phone number to call the day of the visit?" - include this in appointment notes  2. "Do you have or have access to (through a family member/friend) a smartphone with video capability that we can use for your visit?" a. If yes - list this number in appt notes as "cell" (if different from BEST phone #) and list the appointment type as a VIDEO visit in appointment notes b. If no - list the appointment type as a PHONE visit in appointment notes  3. Confirm consent - "In the setting of the current Covid19 crisis, you are scheduled for a (phone or video) visit with your provider on (date) at (time).  Just as we do with many in-office visits, in order for you to participate in this visit, we must obtain consent.  If you'd like, I can send this to your mychart (if signed up) or email for you to review.  Otherwise, I can obtain your verbal consent now.  All virtual visits are billed to your insurance company just like a normal visit would be.  By agreeing to a virtual visit, we'd like you to understand that the technology does not allow for your provider to perform an examination, and thus may limit your provider's ability to fully assess your condition. If your provider identifies any concerns that need to be evaluated in person, we will make arrangements to do so.  Finally, though the technology is pretty good, we cannot assure that it will always work on either your or our end, and in the setting of a video visit, we may have to convert it to a phone-only visit.  In either situation, we cannot ensure that we have a secure connection.  Are you willing to proceed? YES  4. Advise patient to be prepared - "Two hours prior to your appointment, go  ahead and check your blood pressure, pulse, oxygen saturation, and your weight (if you have the equipment to check those) and write them all down. When your visit starts, your provider will ask you for this information. If you have an Apple Watch or Kardia device, please plan to have heart rate information ready on the day of your appointment. Please have a pen and paper handy nearby the day of the visit as well."  5. Give patient instructions for MyChart download to smartphone OR [email or web address] as below if video visit (depending on what platform provider is using)  6. Inform patient they will receive a phone call 15 minutes prior to their appointment time (may be from unknown caller ID) so they should be prepared to answer    TELEPHONE CALL NOTE  Megan [name] has been deemed a candidate for a follow-up tele-health visit to limit community exposure during the Covid-19 pandemic. I spoke with the patient via phone to ensure availability of phone/video source, confirm preferred email & phone number, and discuss instructions and expectations.  I reminded Megan [name] to be prepared with any vital sign and/or heart rhythm information that could potentially be obtained via home monitoring, at the time of her visit. I reminded Megan [name] to expect a phone call prior to her visit.  [name], RN 07/13/2018 2:28 PM  FULL LENGTH CONSENT FOR TELE-HEALTH VISIT   I hereby voluntarily request, consent and authorize [facility] and its employed or contracted physicians, physician assistants, nurse practitioners or other licensed health care professionals (the Practitioner), to provide me with telemedicine health care services (the "Services") as deemed necessary by the treating Practitioner. I acknowledge and consent to receive the Services by the Practitioner via telemedicine. I understand that the telemedicine visit will involve communicating with the Practitioner through live  audiovisual communication technology and the disclosure of certain medical information by electronic transmission. I acknowledge that I have been given the opportunity to request an in-person assessment or other available alternative prior to the telemedicine visit and am voluntarily participating in the telemedicine visit.  I understand that I have the right to withhold or withdraw my consent to the use of telemedicine in the course of my care at any time, without affecting my right to future care or treatment, and that the Practitioner or I may terminate the telemedicine visit at any time. I understand that I have the right to inspect all information obtained and/or recorded in the course of the telemedicine visit and may receive copies of available information for a reasonable fee.  I understand that some of the potential risks of receiving the Services via telemedicine include:  Marland Kitchen Delay or interruption in medical evaluation due to technological equipment failure or disruption; . Information transmitted may not be sufficient (e.g. poor resolution of images) to allow for appropriate medical decision making by the Practitioner; and/or  . In rare instances, security protocols could fail, causing a breach of personal health information.  Furthermore, I acknowledge that it is my responsibility to provide information about my medical history, conditions and care that is complete and accurate to the best of my ability. I acknowledge that Practitioner's advice, recommendations, and/or decision may be based on factors not within their control, such as incomplete or inaccurate data provided by me or distortions of diagnostic images or specimens that may result from electronic transmissions. I understand that the practice of medicine is not an exact science and that Practitioner makes no warranties or guarantees regarding treatment outcomes. I acknowledge that I will receive a copy of this consent concurrently upon  execution via email to the email address I last provided but may also request a printed copy by calling the office of [facility].    I understand that my insurance will be billed for this visit.   I have read or had this consent read to me. . I understand the contents of this consent, which adequately explains the benefits and risks of the Services being provided via telemedicine.  . I have been provided ample opportunity to ask questions regarding this consent and the Services and have had my questions answered to my satisfaction. . I give my informed consent for the services to be provided through the use of telemedicine in my medical care  By participating in this telemedicine visit I agree to the above.

## 2018-07-15 ENCOUNTER — Other Ambulatory Visit: Payer: Self-pay

## 2018-07-15 ENCOUNTER — Ambulatory Visit
Admission: RE | Admit: 2018-07-15 | Discharge: 2018-07-15 | Disposition: A | Discharge status: Home / Self Care | Payer: Medicare Other | Source: Ambulatory Visit | Attending: Internal Medicine | Admitting: Internal Medicine

## 2018-07-15 DIAGNOSIS — Z1231 Encounter for screening mammogram for malignant neoplasm of breast: Secondary | ICD-10-CM

## 2018-07-16 NOTE — Progress Notes (Signed)
Virtual Visit via Telephone Note   This visit type was conducted due to national recommendations for restrictions regarding the COVID-19 Pandemic (e.g. social distancing) in an effort to limit this patient's exposure and mitigate transmission in our community.  Due to her co-morbid illnesses, this patient is at least at moderate risk for complications without adequate follow up.  This format is felt to be most appropriate for this patient at this time.  The patient did not have access to video technology/had technical difficulties with video requiring transitioning to audio format only (telephone).  All issues noted in this document were discussed and addressed.  No physical exam could be performed with this format.  Please refer to the patient's chart for her  consent to telehealth for [facility].   Date:  07/20/2018   ID:  Megan [name], DOB 02-06-1934, MRN [id number]  Patient Location: Home Provider Location: Office  PCP:  Servando Salina, MD  Cardiologist:   Johnsie Cancel Electrophysiologist:  None   Evaluation Performed:  Follow-Up Visit  Chief Complaint:  AS/TAVR  History of Present Illness:    84 y.o. with severe AS Successful TAVR with 23 mm Sapien 3 TF approach 03/03/18. Post op echo with mean gradient elevated 19 mmHg No PVL Has seen Dr Lamonte Sakai for pulmonary HTN He hopes this improves with Rx of her left sided valve disease Cath done 01/29/18 prior to TAVR no CAD. Measured PA pressure on right heart cath only 33/12 with mean 22 mmHg. Mean PCWP 10 mmHg  No complaints Husband passed 6 years ago had been married 45 years Son lives with her and helps out Activity limited by arthritis in knees and back Had steroid shot last week waiting for it to "kick in "  The patient  does not have symptoms concerning for COVID-19 infection (fever, chills, cough, or new shortness of breath).    Past Medical History:  Diagnosis Date  . Anemia   . Arthritis   . Cancer ([place]) 1989   melanoma  .  Diabetes mellitus without complication ([place])   . Dizziness   . GERD (gastroesophageal reflux disease)   . History of hiatal hernia   . Hypertension   . Hypothyroidism   . IBS (irritable bowel syndrome)   . S/P TAVR (transcatheter aortic valve replacement) 03/03/2018   s/p 23 mm Edwards Sapien THV via the TF approach  . Severe aortic stenosis    Past Surgical History:  Procedure Laterality Date  . APPENDECTOMY     84 yrs old  . CHOLECYSTECTOMY  95  . COLONOSCOPY    . EYE SURGERY Bilateral 2017   cataract surgery with lens implants  . JOINT REPLACEMENT Right    knee   . REVERSE SHOULDER ARTHROPLASTY Left 07/14/2014  . REVERSE SHOULDER ARTHROPLASTY Left 07/14/2014   Procedure: LEFT REVERSE SHOULDER ARTHROPLASTY;  Surgeon: Meredith Pel, MD;  Location: [place];  Service: Orthopedics;  Laterality: Left;  . RIGHT/LEFT HEART CATH AND CORONARY ANGIOGRAPHY N/A 01/29/2018   Procedure: RIGHT/LEFT HEART CATH AND CORONARY ANGIOGRAPHY;  Surgeon: Belva Crome, MD;  Location: [place] [facility];  Service: Cardiovascular;  Laterality: N/A;  . SHOULDER ARTHROSCOPY Left   . TEE WITHOUT CARDIOVERSION N/A 03/03/2018   Procedure: TRANSESOPHAGEAL ECHOCARDIOGRAM (TEE);  Surgeon: Sherren Mocha, MD;  Location: [place] [facility];  Service: Open Heart Surgery;  Laterality: N/A;  . TOTAL SHOULDER REVISION Right 10/29/2016   Procedure: RIGHT REVERSE TOTAL SHOULDER;  Surgeon: Meredith Pel, MD;  Location:  [place] [facility];  Service: Orthopedics;  Laterality: Right;  . TRANSCATHETER AORTIC VALVE REPLACEMENT, TRANSFEMORAL N/A 03/03/2018   Procedure: TRANSCATHETER AORTIC VALVE REPLACEMENT, TRANSFEMORAL;  Surgeon: Sherren Mocha, MD;  Location: [place] [facility];  Service: Open Heart Surgery;  Laterality: N/A;     Current Meds  Medication Sig  . ACCU-CHEK AVIVA PLUS test strip 1 each by Other route 2 (two) times daily.   Marland Kitchen ACCU-CHEK SOFTCLIX LANCETS lancets 1 each by Other route 2 (two) times daily.   Marland Kitchen  acetaminophen (TYLENOL) 500 MG tablet Take 500-1,000 mg by mouth every 6 (six) hours as needed (pain.).  Marland Kitchen amLODipine-valsartan (EXFORGE) 5-320 MG tablet Take 0.5 tablets by mouth daily.  Marland Kitchen aspirin 81 MG chewable tablet Chew 1 tablet (81 mg total) by mouth daily.  . calcium-vitamin D (OSCAL WITH D) 500-200 MG-UNIT tablet Take 1 tablet by mouth daily at 12 noon.   . clopidogrel (PLAVIX) 75 MG tablet Take 1 tablet (75 mg total) by mouth daily with breakfast.  . econazole nitrate 1 % cream Apply 1 application topically daily. For nose/corners of mouth  . gabapentin (NEURONTIN) 100 MG capsule Take 1 capsule (100 mg total) by mouth at bedtime.  Marland Kitchen glimepiride (AMARYL) 2 MG tablet Take 2-4 mg by mouth See admin instructions. Take 2 mg by mouth in the morning and4 mg by mouth at night.  . hydrocortisone 2.5 % cream Apply 1 application topically daily. Applied to nose/corners of mouth  . Multiple Vitamin (MULTIVITAMIN WITH MINERALS) TABS tablet Take 1 tablet by mouth daily at 12 noon.   Marland Kitchen MYRBETRIQ 50 MG TB24 tablet Take 50 mg by mouth daily.  . Omega-3 Fatty Acids (FISH OIL) 1200 MG CAPS Take 1,200 mg by mouth daily at 12 noon.   . rosuvastatin (CRESTOR) 5 MG tablet Take 5 mg by mouth daily.  . sitaGLIPtin (JANUVIA) 100 MG tablet Take 100 mg by mouth daily.  Marland Kitchen SYNTHROID 125 MCG tablet Take 125 mcg by mouth daily before breakfast.   . triamterene-hydrochlorothiazide (DYAZIDE) 37.5-25 MG per capsule Take 1 capsule by mouth at bedtime.    Current Facility-Administered Medications for the 07/20/18 encounter (Telemedicine) with Josue Hector, MD  Medication  . betamethasone acetate-betamethasone sodium phosphate (CELESTONE) injection 12 mg     Allergies:   Patient has no known allergies.   Social History   Tobacco Use  . Smoking status: Never Smoker  . Smokeless tobacco: Never Used  Substance Use Topics  . Alcohol use: No  . Drug use: No     Family Hx: The patient's family history includes  Heart disease in her brother and father; Hypertension in her father and mother.  ROS:   Please see the history of present illness.     All other systems reviewed and are negative.   Prior CV studies:   The following studies were reviewed today:  Echo 04/09/18 EF >65% trivial PVL mean gradient 16 mmHg peak 33 mmHg  Labs/Other Tests and Data Reviewed:    EKG:  SR rate 84 RBBB   Recent Labs: 03/02/2018: ALT 10; B Natriuretic Peptide 52.4 03/04/2018: Magnesium 1.7 03/05/2018: BUN 10; Creatinine, Ser 0.86; Hemoglobin 10.4; Platelets 206; Potassium 3.1; Sodium 139   Recent Lipid Panel No results found for: CHOL, TRIG, HDL, CHOLHDL, LDLCALC, LDLDIRECT  Wt Readings from Last 3 Encounters:  07/20/18 87.1 kg  04/09/18 85.3 kg  03/23/18 86.1 kg     Objective:    Vital Signs:  BP 135/70   Pulse  76   Ht 5' 3.5" (1.613 m)   Wt 87.1 kg   LMP  (LMP Unknown)   BMI 33.48 kg/m    Telephone visit no exam   ASSESSMENT & PLAN:    AS/TAVR:  03/01/18 23 mm Edwards Sapien 3 DAT SBE prophylaxis. Gradients elevated from insertion date trivial PVL F/U echo in September   HTN:  Well controlled.  Continue current medications and low sodium Dash type diet.    Thyroid:  On replacement TSH normal   DM:  Discussed low carb diet.  Target hemoglobin A1c is 6.5 or less.  Continue current medications.    COVID-19 Education: The signs and symptoms of COVID-19 were discussed with the patient and how to seek care for testing (follow up with PCP or arrange E-visit).  The importance of social distancing was discussed today.  Time:   Today, I have spent 72minutes with the patient with telehealth technology discussing the above problems.     Medication Adjustments/Labs and Tests Ordered: Current medicines are reviewed at length with the patient today.  Concerns regarding medicines are outlined above.   Tests Ordered:  Echo in September post TAVR   Medication Changes:  None   Disposition:  Follow  up with structural heart 6 months me in a year  Signed, Jenkins Rouge, MD  07/20/2018 8:12 AM    [place]

## 2018-07-20 ENCOUNTER — Other Ambulatory Visit: Payer: Self-pay

## 2018-07-20 ENCOUNTER — Encounter: Payer: Self-pay | Admitting: Cardiovascular Disease

## 2018-07-20 ENCOUNTER — Telehealth (INDEPENDENT_AMBULATORY_CARE_PROVIDER_SITE_OTHER): Payer: Medicare Other | Admitting: Cardiovascular Disease

## 2018-07-20 VITALS — BP 135/70 | HR 76 | Ht 63.5 in | Wt 192.0 lb

## 2018-07-20 DIAGNOSIS — Z952 Presence of prosthetic heart valve: Secondary | ICD-10-CM

## 2018-07-20 NOTE — Patient Instructions (Addendum)
Medication Instructions:   If you need a refill on your cardiac medications before your next appointment, please call your pharmacy.   Lab work:  If you have labs (blood work) drawn today and your tests are completely normal, you will receive your results only by: Marland Kitchen MyChart Message (if you have MyChart) OR . A paper copy in the mail If you have any lab test that is abnormal or we need to change your treatment, we will call you to review the results.  Testing/Procedures: Your physician has requested that you have an echocardiogram in September. Echocardiography is a painless test that uses sound waves to create images of your heart. It provides your doctor with information about the size and shape of your heart and how well your heart's chambers and valves are working. This procedure takes approximately one hour. There are no restrictions for this procedure.  Follow-Up: At [facility], you and your health needs are our priority.  As part of our continuing mission to provide you with exceptional heart care, we have created designated Provider Care Teams.  These Care Teams include your primary Cardiologist (physician) and Advanced Practice Providers (APPs -  Physician Assistants and Nurse Practitioners) who all work together to provide you with the care you need, when you need it. You will need a follow up appointment in September with Dr. Johnsie Cancel with echocardiogram the same day.

## 2018-07-21 NOTE — Progress Notes (Signed)
Megan [name] - 84 y.o. female MRN [id number]  Date of birth: Dec 31, 1934  Office Visit Note: Visit Date: 07/06/2018 PCP: Servando Salina, MD Referred by: Merrilee Seashore, MD  Subjective: Chief Complaint  Patient presents with  . Lower Back - Pain   HPI:  Megan [name] is a 84 y.o. female who comes in today For planned bilateral L4 transforaminal epidural steroid injection.  She has worsening low back pain and bilateral radicular leg pain consistent with severe L4-5 stenosis and listhesis.  She has moderate stenosis at L3-4.  Prior injections helped her quite a bit and the last time I saw her was in June of last year.  She has failed conservative care and was a surgical candidate last year but they were looking at multilevel fusion.  She was going to seek out neurosurgical evaluation as well but during that time she started to have symptoms of severe aortic stenosis and underwent transarterial replacement.  She has had no trauma no red flag complaints.  The injection that will be diagnostic and hopefully therapeutic.  ROS Otherwise per HPI.  Assessment & Plan: Visit Diagnoses:  1. Lumbar radiculopathy     Plan: No additional findings.   Meds & Orders:  Meds ordered this encounter  Medications  . betamethasone acetate-betamethasone sodium phosphate (CELESTONE) injection 12 mg    Orders Placed This Encounter  Procedures  . XR C-ARM NO REPORT  . Epidural Steroid injection    Follow-up: No follow-ups on file.   Procedures: No procedures performed  Lumbosacral Transforaminal Epidural Steroid Injection - Sub-Pedicular Approach with Fluoroscopic Guidance  [name]      Date of Birth: [birth year] MRN: [id number] PCP: Servando Salina, MD      Visit Date: 07/06/2018   Universal Protocol:    Date/Time: 07/06/2018  Consent Given By: the patient  Position: PRONE  Additional Comments: Vital signs were monitored before and after the procedure. Patient was  prepped and draped in the usual sterile fashion. The correct patient, procedure, and site was verified.   Injection Procedure Details:  Procedure Site One Meds Administered:  Meds ordered this encounter  Medications  . betamethasone acetate-betamethasone sodium phosphate (CELESTONE) injection 12 mg    Laterality: Bilateral  Location/Site:  L4-L5  Needle size: 22 G  Needle type: Spinal  Needle Placement: Transforaminal  Findings:    -Comments: Excellent flow of contrast along the nerve and into the epidural space.  Procedure Details: After squaring off the end-plates to get a true AP view, the C-arm was positioned so that an oblique view of the foramen as noted above was visualized. The target area is just inferior to the "nose of the scotty dog" or sub pedicular. The soft tissues overlying this structure were infiltrated with 2-3 ml. of 1% Lidocaine without Epinephrine.  The spinal needle was inserted toward the target using a "trajectory" view along the fluoroscope beam.  Under AP and lateral visualization, the needle was advanced so it did not puncture dura and was located close the 6 O'Clock position of the pedical in AP tracterory. Biplanar projections were used to confirm position. Aspiration was confirmed to be negative for CSF and/or blood. A 1-2 ml. volume of Isovue-250 was injected and flow of contrast was noted at each level. Radiographs were obtained for documentation purposes.   After attaining the desired flow of contrast documented above, a 0.5 to 1.0 ml test dose of 0.25% Marcaine was injected into each respective transforaminal space.  The  patient was observed for 90 seconds post injection.  After no sensory deficits were reported, and normal lower extremity motor function was noted,   the above injectate was administered so that equal amounts of the injectate were placed at each foramen (level) into the transforaminal epidural space.   Additional Comments:  The  patient tolerated the procedure well Dressing: 2 x 2 sterile gauze and Band-Aid    Post-procedure details: Patient was observed during the procedure. Post-procedure instructions were reviewed.  Patient left the clinic in stable condition.     Clinical History: MRI LUMBAR SPINE WITHOUT CONTRAST  TECHNIQUE: Multiplanar, multisequence MR imaging of the lumbar spine was performed. No intravenous contrast was administered.  COMPARISON:  MRI lumbar spine 08/14/2004.  FINDINGS: Segmentation:  Standard.  Alignment: 0.6 cm anterolisthesis L4 on L5 due to facet arthropathy is new since the prior examination. Trace anterolisthesis L5 on S1 is unchanged.  Vertebrae:  No fracture or worrisome lesion.  Conus medullaris: Extends to the L1-2 level and appears normal.  Paraspinal and other soft tissues: Negative.  Disc levels:  T11-12 is imaged in the sagittal plane only. There is a minimal central protrusion without stenosis.  T12-L1:  Minimal right paracentral protrusion without stenosis.  L1-2:  Negative.  L2-3: Mild facet degenerative disease and a minimal disc bulge without central canal or foraminal stenosis.  L3-4: Moderate facet arthropathy, ligamentum flavum thickening and a shallow disc bulge. There is moderate central canal stenosis with narrowing in the subarticular recesses which could impact either descending L4 root. The foramina are open. Spondylosis has worsened since the prior MRI.  L4-5: Advanced facet degenerative disease and bulky ligamentum flavum thickening are seen. The disc is uncovered with a shallow bulge. There is severe central canal and bilateral subarticular recess narrowing. Mild left foraminal narrowing is seen. The right foramen is open. Spondylosis is much worse than on the prior MRI.  L5-S1: Advanced facet degenerative disease is worse on the left. There is a shallow broad-based central protrusion and ligamentum flavum  thickening. Narrowing is seen in the subarticular recesses. Spondylosis has worsened since the prior MRI.  IMPRESSION: Worsened spondylosis at L4-5 where there is severe central canal and bilateral subarticular recess narrowing due to bulky ligamentum flavum thickening and a shallow disc bulge. Advanced facet arthropathy at this level results in 0.6 cm anterolisthesis.  Worsened spondylosis at L3-4 where there is moderate central canal stenosis and narrowing in the subarticular recesses which could impact either descending L4 root.  Some progression spondylosis at L5-S1 where there is a shallow broad-based central protrusion and facet arthropathy resulting in narrowing in the subarticular recesses which could irritate either descending S1 root.   Electronically Signed   By: Inge Rise M.D.   On: 11/26/2016 12:04     Objective:  VS:  HT:    WT:   BMI:     BP:140/72  HR:79bpm  TEMP: ( )  RESP:97 % Physical Exam  Ortho Exam Imaging: No results found.

## 2018-07-21 NOTE — Procedures (Signed)
Lumbosacral Transforaminal Epidural Steroid Injection - Sub-Pedicular Approach with Fluoroscopic Guidance  [name]      Date of Birth: 04-19-1934 MRN: [id number] PCP: Servando Salina, MD      Visit Date: 07/06/2018   Universal Protocol:    Date/Time: 07/06/2018  Consent Given By: the patient  Position: PRONE  Additional Comments: Vital signs were monitored before and after the procedure. Patient was prepped and draped in the usual sterile fashion. The correct patient, procedure, and site was verified.   Injection Procedure Details:  Procedure Site One Meds Administered:  Meds ordered this encounter  Medications  . betamethasone acetate-betamethasone sodium phosphate (CELESTONE) injection 12 mg    Laterality: Bilateral  Location/Site:  L4-L5  Needle size: 22 G  Needle type: Spinal  Needle Placement: Transforaminal  Findings:    -Comments: Excellent flow of contrast along the nerve and into the epidural space.  Procedure Details: After squaring off the end-plates to get a true AP view, the C-arm was positioned so that an oblique view of the foramen as noted above was visualized. The target area is just inferior to the "nose of the scotty dog" or sub pedicular. The soft tissues overlying this structure were infiltrated with 2-3 ml. of 1% Lidocaine without Epinephrine.  The spinal needle was inserted toward the target using a "trajectory" view along the fluoroscope beam.  Under AP and lateral visualization, the needle was advanced so it did not puncture dura and was located close the 6 O'Clock position of the pedical in AP tracterory. Biplanar projections were used to confirm position. Aspiration was confirmed to be negative for CSF and/or blood. A 1-2 ml. volume of Isovue-250 was injected and flow of contrast was noted at each level. Radiographs were obtained for documentation purposes.   After attaining the desired flow of contrast documented above, a  0.5 to 1.0 ml test dose of 0.25% Marcaine was injected into each respective transforaminal space.  The patient was observed for 90 seconds post injection.  After no sensory deficits were reported, and normal lower extremity motor function was noted,   the above injectate was administered so that equal amounts of the injectate were placed at each foramen (level) into the transforaminal epidural space.   Additional Comments:  The patient tolerated the procedure well Dressing: 2 x 2 sterile gauze and Band-Aid    Post-procedure details: Patient was observed during the procedure. Post-procedure instructions were reviewed.  Patient left the clinic in stable condition.

## 2018-08-04 ENCOUNTER — Telehealth: Payer: Self-pay | Admitting: Physical Medicine and Rehabilitation

## 2018-08-04 NOTE — Telephone Encounter (Signed)
Try repeat vs different level, she has severe narrowing of the canal, can see spine surgeon, try PT, maybe update MRI

## 2018-08-04 NOTE — Telephone Encounter (Signed)
Scheduled for [facility] 7/20 at 1415 with a driver. Per last injection, prior auth not needed.

## 2018-08-10 DIAGNOSIS — H16223 Keratoconjunctivitis sicca, not specified as Sjogren's, bilateral: Secondary | ICD-10-CM | POA: Diagnosis not present

## 2018-08-10 DIAGNOSIS — H35033 Hypertensive retinopathy, bilateral: Secondary | ICD-10-CM | POA: Diagnosis not present

## 2018-08-10 DIAGNOSIS — H47011 Ischemic optic neuropathy, right eye: Secondary | ICD-10-CM | POA: Diagnosis not present

## 2018-08-10 DIAGNOSIS — H04123 Dry eye syndrome of bilateral lacrimal glands: Secondary | ICD-10-CM | POA: Diagnosis not present

## 2018-08-17 ENCOUNTER — Other Ambulatory Visit: Payer: Self-pay

## 2018-08-17 ENCOUNTER — Encounter: Payer: Self-pay | Admitting: Physical Medicine and Rehabilitation

## 2018-08-17 ENCOUNTER — Ambulatory Visit (INDEPENDENT_AMBULATORY_CARE_PROVIDER_SITE_OTHER): Payer: Medicare Other | Admitting: Physical Medicine and Rehabilitation

## 2018-08-17 ENCOUNTER — Ambulatory Visit: Payer: Self-pay

## 2018-08-17 VITALS — BP 126/62 | HR 76

## 2018-08-17 DIAGNOSIS — M5416 Radiculopathy, lumbar region: Secondary | ICD-10-CM | POA: Diagnosis not present

## 2018-08-17 DIAGNOSIS — D229 Melanocytic nevi, unspecified: Secondary | ICD-10-CM | POA: Diagnosis not present

## 2018-08-17 DIAGNOSIS — L819 Disorder of pigmentation, unspecified: Secondary | ICD-10-CM | POA: Diagnosis not present

## 2018-08-17 DIAGNOSIS — D1801 Hemangioma of skin and subcutaneous tissue: Secondary | ICD-10-CM | POA: Diagnosis not present

## 2018-08-17 DIAGNOSIS — L814 Other melanin hyperpigmentation: Secondary | ICD-10-CM | POA: Diagnosis not present

## 2018-08-17 DIAGNOSIS — M48062 Spinal stenosis, lumbar region with neurogenic claudication: Secondary | ICD-10-CM | POA: Diagnosis not present

## 2018-08-17 DIAGNOSIS — L821 Other seborrheic keratosis: Secondary | ICD-10-CM | POA: Diagnosis not present

## 2018-08-17 MED ORDER — BETAMETHASONE SOD PHOS & ACET 6 (3-3) MG/ML IJ SUSP
12.0000 mg | Freq: Once | INTRAMUSCULAR | Status: AC
  Administered 2018-08-17: 12 mg

## 2018-08-17 NOTE — Progress Notes (Signed)
 .  Numeric Pain Rating Scale and Functional Assessment Average Pain 7   In the last MONTH (on 0-10 scale) has pain interfered with the following?  1. General activity like being  able to carry out your everyday physical activities such as walking, climbing stairs, carrying groceries, or moving a chair?  Rating(7)   +Driver, +BT(plavix, ok for inj), -Dye Allergies.

## 2018-08-18 NOTE — Progress Notes (Signed)
Megan [name] - 84 y.o. female MRN [id number]  Date of birth: December 16, 1934  Office Visit Note: Visit Date: 08/17/2018 PCP: Servando Salina, MD Referred by: Servando Salina, MD  Subjective: Chief Complaint  Patient presents with   Lower Back - Pain   Right Leg - Pain   Left Leg - Pain   HPI:  Megan [name] is a 84 y.o. female who comes in today For planned diagnostic and hopefully therapeutic epidural injection for severe stenosis.  Patient's had ongoing symptoms of severe stenosis at L4-5.  Last injection about a month ago gave her really good relief on the left but not really much relief on the right.  She has multifactorial stenosis at L4-5.  She has some foraminal narrowing at L5-S1.  I think given the amount of relief that she has had and the fact that she is done well in the past it is warranted to complete a second injection on the left side I would complete the same level but on the right side her symptoms are classic L5 distribution this may be a situation related to do an L5 injection instead of L4 at the level of stenosis centrally.  We will see how she does with this.  We had a long talk today about what happens if injections are just not helping.  Obviously would try adjunctive treatment with medication and regrouping with physical therapy etc.  She does not like the idea of surgery for her lumbar spine at the age of 47 and the fact that she has had some heart issues including pulmonary hypertension and aortic stenosis.  The stenosis was treated with valve replacement.  ROS Otherwise per HPI.  Assessment & Plan: Visit Diagnoses:  1. Lumbar radiculopathy   2. Spinal stenosis of lumbar region with neurogenic claudication     Plan: No additional findings.   Meds & Orders:  Meds ordered this encounter  Medications   betamethasone acetate-betamethasone sodium phosphate (CELESTONE) injection 12 mg    Orders Placed This Encounter  Procedures   XR C-ARM NO REPORT    Epidural Steroid injection    Follow-up: No follow-ups on file.   Procedures: No procedures performed  Lumbosacral Transforaminal Epidural Steroid Injection - Sub-Pedicular Approach with Fluoroscopic Guidance  [name]      Date of Birth: August 21, 1934 MRN: [id number] PCP: Servando Salina, MD      Visit Date: 08/17/2018   Universal Protocol:    Date/Time: 08/17/2018  Consent Given By: the patient  Position: PRONE  Additional Comments: Vital signs were monitored before and after the procedure. Patient was prepped and draped in the usual sterile fashion. The correct patient, procedure, and site was verified.   Injection Procedure Details:  Procedure Site One Meds Administered:  Meds ordered this encounter  Medications   betamethasone acetate-betamethasone sodium phosphate (CELESTONE) injection 12 mg    Laterality: Right. Left  Location/Site:  L5-S1, L4-5  Needle size: 22 G  Needle type: Spinal  Needle Placement: Transforaminal  Findings:    -Comments: Excellent flow of contrast along the nerve and into the epidural space.  Procedure Details: After squaring off the end-plates to get a true AP view, the C-arm was positioned so that an oblique view of the foramen as noted above was visualized. The target area is just inferior to the "nose of the scotty dog" or sub pedicular. The soft tissues overlying this structure were infiltrated with 2-3 ml. of 1% Lidocaine without Epinephrine.  The spinal  needle was inserted toward the target using a "trajectory" view along the fluoroscope beam.  Under AP and lateral visualization, the needle was advanced so it did not puncture dura and was located close the 6 O'Clock position of the pedical in AP tracterory. Biplanar projections were used to confirm position. Aspiration was confirmed to be negative for CSF and/or blood. A 1-2 ml. volume of Isovue-250 was injected and flow of contrast was noted at each level.  Radiographs were obtained for documentation purposes.   After attaining the desired flow of contrast documented above, a 0.5 to 1.0 ml test dose of 0.25% Marcaine was injected into each respective transforaminal space.  The patient was observed for 90 seconds post injection.  After no sensory deficits were reported, and normal lower extremity motor function was noted,   the above injectate was administered so that equal amounts of the injectate were placed at each foramen (level) into the transforaminal epidural space.   Additional Comments:  The patient tolerated the procedure well Dressing: 2 x 2 sterile gauze and Band-Aid    Post-procedure details: Patient was observed during the procedure. Post-procedure instructions were reviewed.  Patient left the clinic in stable condition.     Clinical History: MRI LUMBAR SPINE WITHOUT CONTRAST  TECHNIQUE: Multiplanar, multisequence MR imaging of the lumbar spine was performed. No intravenous contrast was administered.  COMPARISON:  MRI lumbar spine 08/14/2004.  FINDINGS: Segmentation:  Standard.  Alignment: 0.6 cm anterolisthesis L4 on L5 due to facet arthropathy is new since the prior examination. Trace anterolisthesis L5 on S1 is unchanged.  Vertebrae:  No fracture or worrisome lesion.  Conus medullaris: Extends to the L1-2 level and appears normal.  Paraspinal and other soft tissues: Negative.  Disc levels:  T11-12 is imaged in the sagittal plane only. There is a minimal central protrusion without stenosis.  T12-L1:  Minimal right paracentral protrusion without stenosis.  L1-2:  Negative.  L2-3: Mild facet degenerative disease and a minimal disc bulge without central canal or foraminal stenosis.  L3-4: Moderate facet arthropathy, ligamentum flavum thickening and a shallow disc bulge. There is moderate central canal stenosis with narrowing in the subarticular recesses which could impact either descending  L4 root. The foramina are open. Spondylosis has worsened since the prior MRI.  L4-5: Advanced facet degenerative disease and bulky ligamentum flavum thickening are seen. The disc is uncovered with a shallow bulge. There is severe central canal and bilateral subarticular recess narrowing. Mild left foraminal narrowing is seen. The right foramen is open. Spondylosis is much worse than on the prior MRI.  L5-S1: Advanced facet degenerative disease is worse on the left. There is a shallow broad-based central protrusion and ligamentum flavum thickening. Narrowing is seen in the subarticular recesses. Spondylosis has worsened since the prior MRI.  IMPRESSION: Worsened spondylosis at L4-5 where there is severe central canal and bilateral subarticular recess narrowing due to bulky ligamentum flavum thickening and a shallow disc bulge. Advanced facet arthropathy at this level results in 0.6 cm anterolisthesis.  Worsened spondylosis at L3-4 where there is moderate central canal stenosis and narrowing in the subarticular recesses which could impact either descending L4 root.  Some progression spondylosis at L5-S1 where there is a shallow broad-based central protrusion and facet arthropathy resulting in narrowing in the subarticular recesses which could irritate either descending S1 root.   Electronically Signed   By: Inge Rise M.D.   On: 11/26/2016 12:04     Objective:  VS:  HT:  WT:    BMI:      BP:126/62   HR:76bpm   TEMP: ( )   RESP:  Physical Exam  Ortho Exam Imaging: Xr C-arm No Report  Result Date: 08/17/2018 Please see Notes tab for imaging impression.

## 2018-08-18 NOTE — Procedures (Signed)
Lumbosacral Transforaminal Epidural Steroid Injection - Sub-Pedicular Approach with Fluoroscopic Guidance  [name]      Date of Birth: [birth year] MRN: [id number] PCP: Servando Salina, MD      Visit Date: 08/17/2018   Universal Protocol:    Date/Time: 08/17/2018  Consent Given By: the patient  Position: PRONE  Additional Comments: Vital signs were monitored before and after the procedure. Patient was prepped and draped in the usual sterile fashion. The correct patient, procedure, and site was verified.   Injection Procedure Details:  Procedure Site One Meds Administered:  Meds ordered this encounter  Medications  . betamethasone acetate-betamethasone sodium phosphate (CELESTONE) injection 12 mg    Laterality: Right. Left  Location/Site:  L5-S1, L4-5  Needle size: 22 G  Needle type: Spinal  Needle Placement: Transforaminal  Findings:    -Comments: Excellent flow of contrast along the nerve and into the epidural space.  Procedure Details: After squaring off the end-plates to get a true AP view, the C-arm was positioned so that an oblique view of the foramen as noted above was visualized. The target area is just inferior to the "nose of the scotty dog" or sub pedicular. The soft tissues overlying this structure were infiltrated with 2-3 ml. of 1% Lidocaine without Epinephrine.  The spinal needle was inserted toward the target using a "trajectory" view along the fluoroscope beam.  Under AP and lateral visualization, the needle was advanced so it did not puncture dura and was located close the 6 O'Clock position of the pedical in AP tracterory. Biplanar projections were used to confirm position. Aspiration was confirmed to be negative for CSF and/or blood. A 1-2 ml. volume of Isovue-250 was injected and flow of contrast was noted at each level. Radiographs were obtained for documentation purposes.   After attaining the desired flow of contrast documented  above, a 0.5 to 1.0 ml test dose of 0.25% Marcaine was injected into each respective transforaminal space.  The patient was observed for 90 seconds post injection.  After no sensory deficits were reported, and normal lower extremity motor function was noted,   the above injectate was administered so that equal amounts of the injectate were placed at each foramen (level) into the transforaminal epidural space.   Additional Comments:  The patient tolerated the procedure well Dressing: 2 x 2 sterile gauze and Band-Aid    Post-procedure details: Patient was observed during the procedure. Post-procedure instructions were reviewed.  Patient left the clinic in stable condition.

## 2018-09-22 ENCOUNTER — Other Ambulatory Visit: Payer: Self-pay

## 2018-09-22 ENCOUNTER — Encounter: Payer: Self-pay | Admitting: Emergency Medicine

## 2018-09-22 ENCOUNTER — Ambulatory Visit (INDEPENDENT_AMBULATORY_CARE_PROVIDER_SITE_OTHER): Payer: Medicare Other | Admitting: Emergency Medicine

## 2018-09-22 DIAGNOSIS — I2729 Other secondary pulmonary hypertension: Secondary | ICD-10-CM

## 2018-09-22 DIAGNOSIS — R0609 Other forms of dyspnea: Secondary | ICD-10-CM

## 2018-09-22 DIAGNOSIS — G4733 Obstructive sleep apnea (adult) (pediatric): Secondary | ICD-10-CM

## 2018-09-22 DIAGNOSIS — R0600 Dyspnea, unspecified: Secondary | ICD-10-CM

## 2018-09-22 NOTE — Assessment & Plan Note (Signed)
With normal PFTs, no parenchymal disease.  Adequate oxygenation and improved pulmonary pressures.  I believe that her dyspnea was mainly related to her valvular disease, has significantly improved.  She will continue same medications, follow-up with cardiology as planned.

## 2018-09-22 NOTE — Assessment & Plan Note (Signed)
Most recent PASP 33 mmHg.  She has a repeat echocardiogram soon with Dr. Alfonse Spruce.  Suspect that this will be stable as she has recovers from her TAVR.

## 2018-09-22 NOTE — Patient Instructions (Signed)
Congratulations on your exercise regimen.  Keep up the good work as this is going to help your breathing and your overall functional capacity. Get your echocardiogram as planned by Dr. Johnsie Cancel Follow with Dr Lamonte Sakai if needed

## 2018-09-22 NOTE — Progress Notes (Signed)
Subjective:    Patient ID: Megan [name], female    DOB: 1934-07-01, 84 y.o.   MRN: [id number]  HPI 84 year old never smoker with a history of diabetes, GERD with hiatal hernia, IBS, hypothyroidism.  She just underwent TAVR valve replacement for severe aortic stenosis on 03/03/2018.  Hospital and cardiology notes reviewed. She had a postoperative echocardiogram that showed an intact ejection fraction 65%, good valvular function (mean gradient 19 mmHg).  Right ventricular size and function normal, mildly estimated RVSP 43 mmHg.  She is referred today for evaluation of dyspnea. She is doing well post-op. Has been walking per rec's > 15 min / day x 2, planning to up-titrate. Her energy level is improving. She is not having any dyspnea with exertion right now. She does not wheeze, does have a cough that is worse at night, sometimes relates to her GERD sx. She has been told that she snores, had PSG last year that showed only mild OSA although I do not see that study in the record- not currently on CPAP.   Pulmonary function testing on 02/17/2018 reviewed, shows normal airflows without a bronchodilator response, normal lung volumes and a decreased diffusion capacity that does not completely correct when adjusted for alveolar volume (corrects to 77% predicted).  Chest x-ray 03/04/2018 reviewed, shows normal cardiac size, no consolidation or infiltrates, mild blunting of the left costophrenic angle suggestive of a small effusion.  There is calcified granuloma in the right midlung.   ROV 09/22/2018 --this is a follow-up visit for 84 year old woman, never smoker with a history of TAVR for severe aortic stenosis.  I saw her in February for exertional shortness of breath.  Echocardiogram showed mild secondary pulmonary hypertension.  She has mild obstructive sleep apnea but not enough to merit CPAP.  Repeat echocardiogram 04/09/2018 reviewed, showed intact valve function, mild RA dilation and PASP 33 mmHg.  She does not  desaturate with exertion. She is walking > 30 minutes a day. Overall her functional capacity is improved compared w our initial visit.    Review of Systems  Constitutional: Negative for unexpected weight change.  HENT: Positive for ear pain. Negative for congestion, dental problem, nosebleeds, postnasal drip, rhinorrhea, sinus pressure, sneezing, sore throat and trouble swallowing.   Eyes: Positive for itching. Negative for redness.  Respiratory: Positive for cough and shortness of breath. Negative for chest tightness and wheezing.   Cardiovascular: Negative for palpitations and leg swelling.  Gastrointestinal: Negative for nausea and vomiting.  Genitourinary: Negative for dysuria.  Musculoskeletal: Negative for joint swelling.  Skin: Negative for rash.  Allergic/Immunologic: Negative.  Negative for environmental allergies, food allergies and immunocompromised state.  Neurological: Negative for headaches.  Hematological: Does not bruise/bleed easily.  Psychiatric/Behavioral: Negative for dysphoric mood. The patient is not nervous/anxious.        Objective:   Physical Exam Vitals:   09/22/18 1142  BP: (!) 144/88  Pulse: 82  SpO2: 100%  Weight: 88 kg  Height: 5\' 4"  (1.626 m)   Gen: Pleasant, well-nourished, in no distress,  normal affect  ENT: No lesions,  mouth clear,  oropharynx clear, no postnasal drip  Neck: No JVD, no stridor  Lungs: No use of accessory muscles, no crackles or wheezing on normal respiration, no wheeze on forced expiration  Cardiovascular: Regular, soft holosystolic murmur with sharp intact S2  Musculoskeletal: No deformities, no cyanosis or clubbing  Neuro: alert, awake, non focal  Skin: Warm, no lesions or rash     Assessment & Plan:  OSA (obstructive sleep apnea) Mild OSA, CPAP was not initiated.  Denies any significant sleep symptoms  Other secondary pulmonary hypertension ([place]) Most recent PASP 33 mmHg.  She has a repeat echocardiogram soon  with Dr. Alfonse Spruce.  Suspect that this will be stable as she has recovers from her TAVR.  Dyspnea on exertion With normal PFTs, no parenchymal disease.  Adequate oxygenation and improved pulmonary pressures.  I believe that her dyspnea was mainly related to her valvular disease, has significantly improved.  She will continue same medications, follow-up with cardiology as planned.  Baltazar Apo, MD, PhD 09/22/2018, 12:16 PM [facility] [phone] or if no answer [phone]

## 2018-09-22 NOTE — Assessment & Plan Note (Signed)
Mild OSA, CPAP was not initiated.  Denies any significant sleep symptoms

## 2018-10-07 NOTE — Progress Notes (Signed)
Date:  10/16/2018   ID:  Megan [name], DOB 05-Nov-1934, MRN [id number]  Provider Location: Office  PCP:  Servando Salina, MD  Cardiologist:   Johnsie Cancel Electrophysiologist:  None   Evaluation Performed:  Follow-Up Visit  Chief Complaint:  AS/TAVR  History of Present Illness:    84 y.o. with severe AS Successful TAVR with 23 mm Sapien 3 TF approach 03/03/18. Post op echo with mean gradient elevated 19 mmHg No PVL Has seen Dr Lamonte Sakai for pulmonary HTN He hopes this improves with Rx of her left sided valve disease Cath done 01/29/18 prior to TAVR no CAD. Measured PA pressure on right heart cath only 33/12 with mean 22 mmHg. Mean PCWP 10 mmHg  No complaints Husband passed 6 years ago had been married 32 years Son lives with her and helps out Activity limited by arthritis in knees and back   Post TAVR echo 04/09/18 mean gradient 16 mmHg trivial PVL EF >65% Preliminary from today 9/18 mean gradient 18 mmHg stable   Had two vertiginous episodes turning head quickly got dizzy for seconds  ECG with on ly chronic RBBB  The patient  does not have symptoms concerning for COVID-19 infection (fever, chills, cough, or new shortness of breath).    Past Medical History:  Diagnosis Date  . Anemia   . Arthritis   . Cancer ([place]) 1989   melanoma  . Diabetes mellitus without complication ([place])   . Dizziness   . GERD (gastroesophageal reflux disease)   . History of hiatal hernia   . Hypertension   . Hypothyroidism   . IBS (irritable bowel syndrome)   . S/P TAVR (transcatheter aortic valve replacement) 03/03/2018   s/p 23 mm Edwards Sapien THV via the TF approach  . Severe aortic stenosis    Past Surgical History:  Procedure Laterality Date  . APPENDECTOMY     84 yrs old  . CHOLECYSTECTOMY  95  . COLONOSCOPY    . EYE SURGERY Bilateral 2017   cataract surgery with lens implants  . JOINT REPLACEMENT Right    knee   . REVERSE SHOULDER ARTHROPLASTY Left 07/14/2014  . REVERSE SHOULDER  ARTHROPLASTY Left 07/14/2014   Procedure: LEFT REVERSE SHOULDER ARTHROPLASTY;  Surgeon: Meredith Pel, MD;  Location: [facility];  Service: Orthopedics;  Laterality: Left;  . RIGHT/LEFT HEART CATH AND CORONARY ANGIOGRAPHY N/A 01/29/2018   Procedure: RIGHT/LEFT HEART CATH AND CORONARY ANGIOGRAPHY;  Surgeon: Belva Crome, MD;  Location: [place] [facility];  Service: Cardiovascular;  Laterality: N/A;  . SHOULDER ARTHROSCOPY Left   . TEE WITHOUT CARDIOVERSION N/A 03/03/2018   Procedure: TRANSESOPHAGEAL ECHOCARDIOGRAM (TEE);  Surgeon: Sherren Mocha, MD;  Location: [facility];  Service: Open Heart Surgery;  Laterality: N/A;  . TOTAL SHOULDER REVISION Right 10/29/2016   Procedure: RIGHT REVERSE TOTAL SHOULDER;  Surgeon: Meredith Pel, MD;  Location: [place];  Service: Orthopedics;  Laterality: Right;  . TRANSCATHETER AORTIC VALVE REPLACEMENT, TRANSFEMORAL N/A 03/03/2018   Procedure: TRANSCATHETER AORTIC VALVE REPLACEMENT, TRANSFEMORAL;  Surgeon: Sherren Mocha, MD;  Location: [place];  Service: Open Heart Surgery;  Laterality: N/A;     No outpatient medications have been marked as taking for the 10/16/18 encounter (Office Visit) with Josue Hector, MD.   Current Facility-Administered Medications for the 10/16/18 encounter (Office Visit) with Josue Hector, MD  Medication  . betamethasone acetate-betamethasone sodium phosphate (CELESTONE) injection 12 mg     Allergies:   Patient has  no known allergies.   Social History   Tobacco Use  . Smoking status: Never Smoker  . Smokeless tobacco: Never Used  Substance Use Topics  . Alcohol use: No  . Drug use: No     Family Hx: The patient's family history includes Heart disease in her brother and father; Hypertension in her father and mother.  ROS:   Please see the history of present illness.     All other systems reviewed and are negative.   Prior CV studies:   The following studies were reviewed today:  Echo 04/09/18  EF >65% trivial PVL mean gradient 16 mmHg peak 33 mmHg  Labs/Other Tests and Data Reviewed:    EKG:  SR rate 84 RBBB   Recent Labs: 03/02/2018: ALT 10; B Natriuretic Peptide 52.4 03/04/2018: Magnesium 1.7 03/05/2018: BUN 10; Creatinine, Ser 0.86; Hemoglobin 10.4; Platelets 206; Potassium 3.1; Sodium 139   Recent Lipid Panel No results found for: CHOL, TRIG, HDL, CHOLHDL, LDLCALC, LDLDIRECT  Wt Readings from Last 3 Encounters:  10/16/18 195 lb (88.5 kg)  09/22/18 194 lb (88 kg)  07/20/18 192 lb (87.1 kg)     Objective:    Vital Signs:  BP 130/62   Pulse 80   Ht 5\' 4"  (1.626 m)   Wt 195 lb (88.5 kg)   LMP  (LMP Unknown)   SpO2 96%   BMI 33.47 kg/m    Affect appropriate Healthy:  appears stated age HEENT: normal Neck supple with no adenopathy JVP normal no bruits no thyromegaly Lungs clear with no wheezing and good diaphragmatic motion Heart:  S1/S2 SEM no AR  murmur, no rub, gallop or click PMI normal Abdomen: benighn, BS positve, no tenderness, no AAA no bruit.  No HSM or HJR Distal pulses intact with no bruits No edema Neuro non-focal Skin warm and dry No muscular weakness   ASSESSMENT & PLAN:    AS/TAVR:  03/01/18 23 mm Edwards Sapien 3 DAT SBE prophylaxis. Gradients elevated from insertion date trivial PVL F/U echo March 2021  HTN:  Well controlled.  Continue current medications and low sodium Dash type diet.    Thyroid:  On replacement TSH normal   DM:  Discussed low carb diet.  Target hemoglobin A1c is 6.5 or less.  Continue current medications.    COVID-19 Education: The signs and symptoms of COVID-19 were discussed with the patient and how to seek care for testing (follow up with PCP or arrange E-visit).  The importance of social distancing was discussed today.   Medication Adjustments/Labs and Tests Ordered: Current medicines are reviewed at length with the patient today.  Concerns regarding medicines are outlined above.   Tests Ordered:  Echo March  2021   Medication Changes:  None   Disposition:  Follow up with echo March 2021   Signed, Jenkins Rouge, MD  10/16/2018 10:40 AM    [place]

## 2018-10-13 DIAGNOSIS — I10 Essential (primary) hypertension: Secondary | ICD-10-CM | POA: Diagnosis not present

## 2018-10-13 DIAGNOSIS — E039 Hypothyroidism, unspecified: Secondary | ICD-10-CM | POA: Diagnosis not present

## 2018-10-13 DIAGNOSIS — E118 Type 2 diabetes mellitus with unspecified complications: Secondary | ICD-10-CM | POA: Diagnosis not present

## 2018-10-13 DIAGNOSIS — I2510 Atherosclerotic heart disease of native coronary artery without angina pectoris: Secondary | ICD-10-CM | POA: Diagnosis not present

## 2018-10-13 DIAGNOSIS — E559 Vitamin D deficiency, unspecified: Secondary | ICD-10-CM | POA: Diagnosis not present

## 2018-10-15 DIAGNOSIS — E118 Type 2 diabetes mellitus with unspecified complications: Secondary | ICD-10-CM | POA: Diagnosis not present

## 2018-10-15 DIAGNOSIS — E039 Hypothyroidism, unspecified: Secondary | ICD-10-CM | POA: Diagnosis not present

## 2018-10-15 DIAGNOSIS — I10 Essential (primary) hypertension: Secondary | ICD-10-CM | POA: Diagnosis not present

## 2018-10-15 DIAGNOSIS — E782 Mixed hyperlipidemia: Secondary | ICD-10-CM | POA: Diagnosis not present

## 2018-10-16 ENCOUNTER — Other Ambulatory Visit: Payer: Self-pay

## 2018-10-16 ENCOUNTER — Encounter: Payer: Self-pay | Admitting: Cardiovascular Disease

## 2018-10-16 ENCOUNTER — Ambulatory Visit (HOSPITAL_COMMUNITY): Payer: Medicare Other | Attending: Cardiovascular Disease

## 2018-10-16 ENCOUNTER — Ambulatory Visit (INDEPENDENT_AMBULATORY_CARE_PROVIDER_SITE_OTHER): Payer: Medicare Other | Admitting: Cardiovascular Disease

## 2018-10-16 VITALS — BP 130/62 | HR 80 | Ht 64.0 in | Wt 195.0 lb

## 2018-10-16 DIAGNOSIS — I350 Nonrheumatic aortic (valve) stenosis: Secondary | ICD-10-CM | POA: Diagnosis not present

## 2018-10-16 DIAGNOSIS — Z952 Presence of prosthetic heart valve: Secondary | ICD-10-CM | POA: Diagnosis not present

## 2018-10-16 NOTE — Patient Instructions (Addendum)

## 2018-10-20 DIAGNOSIS — E782 Mixed hyperlipidemia: Secondary | ICD-10-CM | POA: Diagnosis not present

## 2018-10-20 DIAGNOSIS — E1165 Type 2 diabetes mellitus with hyperglycemia: Secondary | ICD-10-CM | POA: Diagnosis not present

## 2018-10-20 DIAGNOSIS — I10 Essential (primary) hypertension: Secondary | ICD-10-CM | POA: Diagnosis not present

## 2018-10-20 DIAGNOSIS — I2510 Atherosclerotic heart disease of native coronary artery without angina pectoris: Secondary | ICD-10-CM | POA: Diagnosis not present

## 2018-10-20 DIAGNOSIS — H8110 Benign paroxysmal vertigo, unspecified ear: Secondary | ICD-10-CM | POA: Diagnosis not present

## 2018-11-17 ENCOUNTER — Telehealth: Payer: Self-pay | Admitting: Physical Medicine and Rehabilitation

## 2018-11-17 NOTE — Telephone Encounter (Signed)
ok 

## 2018-11-18 NOTE — Telephone Encounter (Signed)
Is auth needed? Scheduled for 11/10.

## 2018-11-18 NOTE — Telephone Encounter (Signed)
[id number]  Injection(s), anesthetic agent and/or st more  Notification/Prior Authorization not required if procedure performed in Office; otherwise may be required for this service.

## 2018-12-02 ENCOUNTER — Encounter: Payer: Self-pay | Admitting: Neurology

## 2018-12-02 ENCOUNTER — Ambulatory Visit: Payer: Medicare Other | Admitting: Neurology

## 2018-12-02 ENCOUNTER — Telehealth: Payer: Self-pay | Admitting: Neurology

## 2018-12-02 ENCOUNTER — Other Ambulatory Visit: Payer: Self-pay

## 2018-12-02 DIAGNOSIS — H90A21 Sensorineural hearing loss, unilateral, right ear, with restricted hearing on the contralateral side: Secondary | ICD-10-CM

## 2018-12-02 DIAGNOSIS — M542 Cervicalgia: Secondary | ICD-10-CM

## 2018-12-02 DIAGNOSIS — G3281 Cerebellar ataxia in diseases classified elsewhere: Secondary | ICD-10-CM

## 2018-12-02 DIAGNOSIS — H81399 Other peripheral vertigo, unspecified ear: Secondary | ICD-10-CM

## 2018-12-02 NOTE — Progress Notes (Signed)
[facility]  [name] DOB: [birth year]  REFERRING DOCTOR OR PCP:  [facility] is PCP; Dr. Herbert Deaner is ophthalmologist SOURCE: patient, notes from Dr. Wilson Singer, lab reports  _________________________________   HISTORICAL  CHIEF COMPLAINT:  Chief Complaint  Patient presents with   Referral    Positional vertigo room 13 pt alone    HISTORY OF PRESENT ILLNESS:   Update 12/02/2018: Megan [name] is an 84 y.o. woman who I have previously seen for right nonarteritic anterior ischemic optic neuropathy and poor balance and OSA now reporting positional vertigo.  She has had some issues off/on with balance and dizziness.   Her current symptoms are similar but occur more frequently.  She gets episodes of vertigo lasting about one minute and feels like she is being one way or the other but left > right.   She recalls one episode rolling over in bed and another episode just turning her head to take to he son in the kitchen that triggered an event..   Looking up has triggered a couple spell.       She also notes neck pain.   She has been told she may have a sprained muscle   Even with a muscle relaxant and a shot (steroid?) she has had pain.     Update 09/30/2017: Megan [name] is an 84 yo woman with a history of right nonarteritic anterior ischemic optic neuropathy and poor balance.  Since the last visit, vision has improved and is much better, though not baseline.    We ordered a PSG and it showed mild OSA (AHI = 8.2).    She has only mild sleepiness in the afternoons.   She is trying to lose weight.   She ahs had some fluctuations with changed in DM medications.     She continues to have some spells of lightheadedness with gait ataxia.  She has only one actual fall, occurring with severe lightheadedness during the first episode.    No episode of syncope.  She does have aortic stenosis and is followed closely by cardiology.  Balance is worse right  after she stands up, especially in the morning from bed.    She has some urinary frequency, helped by Myrbetriq.  No leg weakness.      I personally reviewed the CT scan of the head from 01/04/2016.  Brain volume was normal for age.  Mild chronic microvascular ischemic changes.  Some calcification with in the arteries at the skull base.  Update 05/28/2017: Megan [name] is an 84 year old woman who I have previously seen for poor balance here today for evaluation of daytime sleepiness and concern about obstructive sleep apnea.  She had the onset of painless right vision loss starting in May 2018 and saw her ophthalmologist, Dr. Herbert Deaner who noted optic disc edema.  She referred her to Dr. Hassell Done, Neuro-ophthalmologist at [facility], who diagnosed with nonarteritic anterior ischemic optic neuropathy.  Both he and her cardiologist are concerned about OSA.     She has had snoring and mild excessive daytime sleepiness.  She has been told that her snoring is loud and she sometimes makes some other noises with her breathing.  She typically goes to bed at 930 to 10 pm and falls asleep in a minute or two.   After 4 hours or so, she wakes up and gets out of bed x 2 hours and then goes back to sleep for a couple more hours.     EPWORTH SLEEPINESS  SCALE  On a scale of 0 - 3 what is the chance of dozing:  Sitting and Reading:   1 Watching TV:    3 Sitting inactive in a public place: 0 Passenger in car for one hour: 3 Lying down to rest in the afternoon: 0 Sitting and talking to someone: 0 Sitting quietly after lunch:  1 In a car, stopped in traffic:  0  Total (out of 24):   8/24 normal  She still has had a few more spells of reduced balance and lightheadedness.   She has aortic stenosis and hypertension.   She is on amlodipine/valsartan and Dyazide for HTN.   She also has NIDDM.     From 10/08/2016: Since the last visit, she has not had any more of the episodes of severe lightheadedness with some vertigo. An  echocardiogram showed she had moderate aortic stenosis and she was seen by Dr. Johnsie Cancel of cardiology 9stufies and notes reviewed). He is going to follow her echocardiograms and a procedure may be necessary at some point.    She feels unsteady going up and down stairs and needs to hold on.   She has stress incontinence and urgency.   She denies neck pain.      She has no more falls. She did fall last year and a head CT scan 01/17/2016 did not show any evidence of stroke though she did have mild chronic microvascular ischemic change.  She denies any weakness in her legs but she has some numbness and pain and sees Pain management who has done epidural steroid injections.   She feels some of her unsteadiness is coming from her back and knees.   She has some urinary frequency helped by medication. This is chronic.    REVIEW OF SYSTEMS: Constitutional: No fevers, chills, sweats, or change in appetite.   Mild fatigue Eyes: See above Ear, nose and throat: No hearing loss, ear pain, nasal congestion, sore throat Cardiovascular: No chest pain, palpitations Respiratory: No shortness of breath at rest or with exertion.   No wheezes GastrointestinaI: No nausea, vomiting, diarrhea, abdominal pain, fecal incontinence Genitourinary: No dysuria, urinary retention or frequency.  No nocturia. Musculoskeletal:She reports neck and shoulder pain at times.  There is some back pain.  Integumentary: No rash, pruritus, skin lesions Neurological: as above Psychiatric: No depression at this time.  No anxiety Endocrine: No palpitations, diaphoresis, change in appetite, change in weigh or increased thirst Hematologic/Lymphatic: No anemia, purpura, petechiae. Allergic/Immunologic: No itchy/runny eyes, nasal congestion, recent allergic reactions, rashes  ALLERGIES: No Known Allergies  HOME MEDICATIONS:  Current Outpatient Medications:    ACCU-CHEK AVIVA PLUS test strip, 1 each by Other route 2 (two) times daily. ,  Disp: , Rfl:    ACCU-CHEK SOFTCLIX LANCETS lancets, 1 each by Other route 2 (two) times daily. , Disp: , Rfl:    acetaminophen (TYLENOL) 500 MG tablet, Take 500-1,000 mg by mouth every 6 (six) hours as needed (pain.)., Disp: , Rfl:    amLODipine-valsartan (EXFORGE) 5-320 MG tablet, Take 0.5 tablets by mouth daily. (Patient taking differently: Take 0.5 tablets by mouth daily. Pt takes 5-160mg ), Disp: 15 tablet, Rfl: 11   aspirin 81 MG chewable tablet, Chew 1 tablet (81 mg total) by mouth daily., Disp: , Rfl:    calcium-vitamin D (OSCAL WITH D) 500-200 MG-UNIT tablet, Take 1 tablet by mouth daily at 12 noon. , Disp: , Rfl:    econazole nitrate 1 % cream, Apply 1 application topically daily. For nose/corners  of mouth, Disp: , Rfl:    Ergocalciferol (VITAMIN D2 PO), Take 2,000 mg by mouth., Disp: , Rfl:    gabapentin (NEURONTIN) 100 MG capsule, Take 1 capsule (100 mg total) by mouth at bedtime., Disp: 180 capsule, Rfl: 2   glimepiride (AMARYL) 2 MG tablet, Take 2-4 mg by mouth See admin instructions. Take 2 mg by mouth in the morning and4 mg by mouth at night., Disp: , Rfl: 12   hydrocortisone 2.5 % cream, Apply 1 application topically daily. Applied to nose/corners of mouth, Disp: , Rfl:    Multiple Vitamin (MULTIVITAMIN WITH MINERALS) TABS tablet, Take 1 tablet by mouth daily at 12 noon. , Disp: , Rfl:    MYRBETRIQ 50 MG TB24 tablet, Take 50 mg by mouth daily., Disp: , Rfl: 11   Omega-3 Fatty Acids (FISH OIL) 1200 MG CAPS, Take 1,200 mg by mouth daily at 12 noon. , Disp: , Rfl:    rosuvastatin (CRESTOR) 5 MG tablet, Take 5 mg by mouth daily., Disp: , Rfl:    sitaGLIPtin (JANUVIA) 100 MG tablet, Take 100 mg by mouth daily., Disp: , Rfl:    SYNTHROID 125 MCG tablet, Take 125 mcg by mouth daily before breakfast. Take 6 days a week, Disp: , Rfl: 12   triamterene-hydrochlorothiazide (DYAZIDE) 37.5-25 MG per capsule, Take 1 capsule by mouth at bedtime. , Disp: , Rfl: 11  Current  Facility-Administered Medications:    betamethasone acetate-betamethasone sodium phosphate (CELESTONE) injection 12 mg, 12 mg, Other, Once, Magnus Sinning, MD  PAST MEDICAL HISTORY: Past Medical History:  Diagnosis Date   Anemia    Arthritis    Cancer ([place]) 1989   melanoma   Diabetes mellitus without complication ([place])    Dizziness    GERD (gastroesophageal reflux disease)    History of hiatal hernia    Hypertension    Hypothyroidism    IBS (irritable bowel syndrome)    S/P TAVR (transcatheter aortic valve replacement) 03/03/2018   s/p 23 mm Edwards Sapien THV via the TF approach   Severe aortic stenosis     PAST SURGICAL HISTORY: Past Surgical History:  Procedure Laterality Date   APPENDECTOMY     84 yrs old   CHOLECYSTECTOMY  89   COLONOSCOPY     EYE SURGERY Bilateral 2017   cataract surgery with lens implants   JOINT REPLACEMENT Right    knee    REVERSE SHOULDER ARTHROPLASTY Left 07/14/2014   REVERSE SHOULDER ARTHROPLASTY Left 07/14/2014   Procedure: LEFT REVERSE SHOULDER ARTHROPLASTY;  Surgeon: Meredith Pel, MD;  Location: [place];  Service: Orthopedics;  Laterality: Left;   RIGHT/LEFT HEART CATH AND CORONARY ANGIOGRAPHY N/A 01/29/2018   Procedure: RIGHT/LEFT HEART CATH AND CORONARY ANGIOGRAPHY;  Surgeon: Belva Crome, MD;  Location: [place] [facility];  Service: Cardiovascular;  Laterality: N/A;   SHOULDER ARTHROSCOPY Left    TEE WITHOUT CARDIOVERSION N/A 03/03/2018   Procedure: TRANSESOPHAGEAL ECHOCARDIOGRAM (TEE);  Surgeon: Sherren Mocha, MD;  Location: [place] [facility];  Service: Open Heart Surgery;  Laterality: N/A;   TOTAL SHOULDER REVISION Right 10/29/2016   Procedure: RIGHT REVERSE TOTAL SHOULDER;  Surgeon: Meredith Pel, MD;  Location: [place];  Service: Orthopedics;  Laterality: Right;   TRANSCATHETER AORTIC VALVE REPLACEMENT, TRANSFEMORAL N/A 03/03/2018   Procedure: TRANSCATHETER AORTIC VALVE REPLACEMENT, TRANSFEMORAL;   Surgeon: Sherren Mocha, MD;  Location: [place] [facility];  Service: Open Heart Surgery;  Laterality: N/A;    FAMILY HISTORY: Family History  Problem Relation Age  of Onset   Hypertension Mother    Hypertension Father    Heart disease Father    Heart disease Brother     SOCIAL HISTORY:  Social History   Socioeconomic History   Marital status: Widowed    Spouse name: Not on file   Number of children: Not on file   Years of education: Not on file   Highest education level: Not on file  Occupational History   Not on file  Social Needs   Financial resource strain: Not on file   Food insecurity    Worry: Not on file    Inability: Not on file   Transportation needs    Medical: Not on file    Non-medical: Not on file  Tobacco Use   Smoking status: Never Smoker   Smokeless tobacco: Never Used  Substance and Sexual Activity   Alcohol use: No   Drug use: No   Sexual activity: Not on file  Lifestyle   Physical activity    Days per week: Not on file    Minutes per session: Not on file   Stress: Not on file  Relationships   Social connections    Talks on phone: Not on file    Gets together: Not on file    Attends religious service: Not on file    Active member of club or organization: Not on file    Attends meetings of clubs or organizations: Not on file    Relationship status: Not on file   Intimate partner violence    Fear of current or ex partner: Not on file    Emotionally abused: Not on file    Physically abused: Not on file    Forced sexual activity: Not on file  Other Topics Concern   Not on file  Social History Narrative   Not on file     PHYSICAL EXAM  Vitals:   12/02/18 1015  BP: 132/67  Pulse: 81  Temp: 97.7 F (36.5 C)  Weight: 196 lb (88.9 kg)  Height: 5\' 4"  (1.626 m)   No data found.    Body mass index is 33.64 kg/m.   General: The patient is well-developed and well-nourished and in no acute distress.    The  neck has a reduced range of motion.  HEENT: Tympanic membranes are intact.  Ear canals were fairly normal.  Hearing was mildly reduced on the right.  Funduscopic evaluation was difficult due to small pupils.  Cardiovascular: The heart has a regular rate and rhythm no bruits over the carotid arteries.  Skin: Extremities are without rash or edema.  Neurologic Exam  Mental status: The patient is alert and oriented x 3 at the time of the examination. The patient has apparent normal recent and remote memory, with an apparently normal attention span and concentration ability.   Speech is normal.  Cranial nerves: Extraocular movements are full.   Colors are mildly desaturated OD and she had a 1+ right APD.  Facial strength and sensation is normal. Trapezius strength is normal. The tongue is midline, and the patient has symmetric elevation of the soft palate.  Hearing mildly reduced on the right..     Motor:  Muscle bulk is normal.   Tone is normal. Strength is  5 / 5 in all 4 extremities.   Sensory:    She has mildly reduced vibratory sensation in the feet and normal sensation elsewhere.  Coordination: Cerebellar testing shows fairly normal finger-to-nose and heel-to-shin bilaterally.  Gait and station: Station is normal.   Gait is mildly arthritic and mildly wide.  She can't do tandem and takes 4 steps to tuen 180 degrees.   She has retropulsion.. Romberg is negative.   Reflexes: Deep tendon reflexes are symmetric and normal in arms, 2 at the knees and absent at the toes 2+ at the knees and absent at the ankles.   Other: The Dix-Hallpike maneuver was performed separately to each side.  She did not have any evoked vertigo or nystagmus.   DIAGNOSTIC DATA (LABS, IMAGING, TESTING) - I reviewed patient records, labs, notes, testing and imaging myself where available.  Lab Results  Component Value Date   WBC 9.1 03/05/2018   HGB 10.4 (L) 03/05/2018   HCT 31.9 (L) 03/05/2018   MCV 88.4  03/05/2018   PLT 206 03/05/2018      Component Value Date/Time   NA 139 03/05/2018 0303   NA 137 01/23/2018 1040   K 3.1 (L) 03/05/2018 0303   CL 103 03/05/2018 0303   CO2 22 03/05/2018 0303   GLUCOSE 138 (H) 03/05/2018 0303   BUN 10 03/05/2018 0303   BUN 17 01/23/2018 1040   CREATININE 0.86 03/05/2018 0303   CALCIUM 8.7 (L) 03/05/2018 0303   PROT 7.4 03/02/2018 0941   ALBUMIN 3.8 03/02/2018 0941   AST 22 03/02/2018 0941   ALT 10 03/02/2018 0941   ALKPHOS 62 03/02/2018 0941   BILITOT 0.7 03/02/2018 0941   GFRNONAA >60 03/05/2018 0303   GFRAA >60 03/05/2018 0303       ASSESSMENT AND PLAN  Cerebellar ataxia in diseases classified elsewhere ([place]) - Plan: MR CERVICAL SPINE WO CONTRAST, MR BRAIN W WO CONTRAST  Other peripheral vertigo, unspecified ear - Plan: MR BRAIN W WO CONTRAST  Cervicalgia - Plan: MR CERVICAL SPINE WO CONTRAST  Sensorineural hearing loss (SNHL) of right ear with restricted hearing of left ear - Plan: MR BRAIN W WO CONTRAST   1.   We will check an MRI of the brain to evaluate the vertigo and hearing loss to determine if there is an acoustic schwannoma or other process.  This will also allow [place] to evaluate the brain for causes of her gait issues.  An MRI of the cervical spine will perform for her neck pain and gait issues. 2.   Use a cane or walker for safety.   3.  She will return to see me in 4 months or sooner if new or worsening symptoms.  Lennyn Gange A. Felecia Shelling, MD, PhD [id number], [id number] PM Certified in Neurology, Clinical Neurophysiology, Sleep Medicine, Pain Medicine and Neuroimaging  [facility] [address] [place], [place] [address] [phone]

## 2018-12-02 NOTE — Telephone Encounter (Signed)
[employer] medicare order sent to GI. No auth they will reach out to the patient to schedule.  

## 2018-12-08 ENCOUNTER — Other Ambulatory Visit: Payer: Self-pay

## 2018-12-08 ENCOUNTER — Ambulatory Visit: Payer: Self-pay

## 2018-12-08 ENCOUNTER — Encounter: Payer: Self-pay | Admitting: Physical Medicine and Rehabilitation

## 2018-12-08 ENCOUNTER — Ambulatory Visit: Payer: Medicare Other | Admitting: Physical Medicine and Rehabilitation

## 2018-12-08 VITALS — BP 130/74 | HR 77

## 2018-12-08 DIAGNOSIS — M48062 Spinal stenosis, lumbar region with neurogenic claudication: Secondary | ICD-10-CM

## 2018-12-08 DIAGNOSIS — M5416 Radiculopathy, lumbar region: Secondary | ICD-10-CM

## 2018-12-08 MED ORDER — BETAMETHASONE SOD PHOS & ACET 6 (3-3) MG/ML IJ SUSP
12.0000 mg | Freq: Once | INTRAMUSCULAR | Status: AC
  Administered 2018-12-08: 12 mg

## 2018-12-08 NOTE — Progress Notes (Signed)
 .  Numeric Pain Rating Scale and Functional Assessment Average Pain 6   In the last MONTH (on 0-10 scale) has pain interfered with the following?  1. General activity like being  able to carry out your everyday physical activities such as walking, climbing stairs, carrying groceries, or moving a chair?  Rating(6)   +Driver, -BT, -Dye Allergies.  

## 2018-12-29 ENCOUNTER — Ambulatory Visit
Admission: RE | Admit: 2018-12-29 | Discharge: 2018-12-29 | Disposition: A | Discharge status: Home / Self Care | Payer: Medicare Other | Source: Ambulatory Visit | Attending: Neurology | Admitting: Neurology

## 2018-12-29 ENCOUNTER — Other Ambulatory Visit: Payer: Self-pay

## 2018-12-29 DIAGNOSIS — H81399 Other peripheral vertigo, unspecified ear: Secondary | ICD-10-CM | POA: Diagnosis not present

## 2018-12-29 DIAGNOSIS — H90A21 Sensorineural hearing loss, unilateral, right ear, with restricted hearing on the contralateral side: Secondary | ICD-10-CM | POA: Diagnosis not present

## 2018-12-29 DIAGNOSIS — G3281 Cerebellar ataxia in diseases classified elsewhere: Secondary | ICD-10-CM | POA: Diagnosis not present

## 2018-12-29 DIAGNOSIS — M542 Cervicalgia: Secondary | ICD-10-CM

## 2018-12-29 MED ORDER — GADOBENATE DIMEGLUMINE 529 MG/ML IV SOLN
15.0000 mL | Freq: Once | INTRAVENOUS | Status: AC | PRN
  Administered 2018-12-29: 15 mL via INTRAVENOUS

## 2018-12-30 ENCOUNTER — Telehealth: Payer: Self-pay | Admitting: *Deleted

## 2018-12-30 NOTE — Telephone Encounter (Signed)
-----   Message from Britt Bottom, MD sent at 12/30/2018  2:16 PM EST ----- Please let her know that the MRI of the brain was normal for age.  The MRI of the cervical spine showed some degenerative changes but nothing bad enough to affect her balance or gait.

## 2018-12-30 NOTE — Telephone Encounter (Signed)
Called and spoke with pt. Relayed results per Dr. Felecia Shelling note. She will keep f/u for 04/05/19 at 11am and will call before then if she has new/worsening sx.

## 2019-02-10 DIAGNOSIS — E039 Hypothyroidism, unspecified: Secondary | ICD-10-CM | POA: Diagnosis not present

## 2019-02-10 DIAGNOSIS — E118 Type 2 diabetes mellitus with unspecified complications: Secondary | ICD-10-CM | POA: Diagnosis not present

## 2019-02-12 DIAGNOSIS — E118 Type 2 diabetes mellitus with unspecified complications: Secondary | ICD-10-CM | POA: Diagnosis not present

## 2019-02-15 ENCOUNTER — Other Ambulatory Visit (INDEPENDENT_AMBULATORY_CARE_PROVIDER_SITE_OTHER): Payer: Self-pay | Admitting: Physical Medicine and Rehabilitation

## 2019-02-15 DIAGNOSIS — E039 Hypothyroidism, unspecified: Secondary | ICD-10-CM | POA: Diagnosis not present

## 2019-02-15 DIAGNOSIS — E119 Type 2 diabetes mellitus without complications: Secondary | ICD-10-CM | POA: Diagnosis not present

## 2019-02-15 DIAGNOSIS — E782 Mixed hyperlipidemia: Secondary | ICD-10-CM | POA: Diagnosis not present

## 2019-02-15 DIAGNOSIS — I10 Essential (primary) hypertension: Secondary | ICD-10-CM | POA: Diagnosis not present

## 2019-02-15 NOTE — Telephone Encounter (Signed)
Please advise 

## 2019-03-01 NOTE — Procedures (Signed)
Lumbosacral Transforaminal Epidural Steroid Injection - Sub-Pedicular Approach with Fluoroscopic Guidance  [name]      Date of Birth: 22-Dec-1934 MRN: [id number] PCP: Merrilee Seashore, MD      Visit Date: 12/08/2018   Universal Protocol:    Date/Time: 12/08/2018  Consent Given By: the patient  Position: PRONE  Additional Comments: Vital signs were monitored before and after the procedure. Patient was prepped and draped in the usual sterile fashion. The correct patient, procedure, and site was verified.   Injection Procedure Details:  Procedure Site One Meds Administered:  Meds ordered this encounter  Medications  . betamethasone acetate-betamethasone sodium phosphate (CELESTONE) injection 12 mg    Laterality: Right, Left  Location/Site:  L5-S1, L4-5  Needle size: 22 G  Needle type: Spinal  Needle Placement: Transforaminal  Findings:    -Comments: Excellent flow of contrast along the nerve and into the epidural space.  Procedure Details: After squaring off the end-plates to get a true AP view, the C-arm was positioned so that an oblique view of the foramen as noted above was visualized. The target area is just inferior to the "nose of the scotty dog" or sub pedicular. The soft tissues overlying this structure were infiltrated with 2-3 ml. of 1% Lidocaine without Epinephrine.  The spinal needle was inserted toward the target using a "trajectory" view along the fluoroscope beam.  Under AP and lateral visualization, the needle was advanced so it did not puncture dura and was located close the 6 O'Clock position of the pedical in AP tracterory. Biplanar projections were used to confirm position. Aspiration was confirmed to be negative for CSF and/or blood. A 1-2 ml. volume of Isovue-250 was injected and flow of contrast was noted at each level. Radiographs were obtained for documentation purposes.   After attaining the desired flow of contrast documented  above, a 0.5 to 1.0 ml test dose of 0.25% Marcaine was injected into each respective transforaminal space.  The patient was observed for 90 seconds post injection.  After no sensory deficits were reported, and normal lower extremity motor function was noted,   the above injectate was administered so that equal amounts of the injectate were placed at each foramen (level) into the transforaminal epidural space.   Additional Comments:  The patient tolerated the procedure well Dressing: 2 x 2 sterile gauze and Band-Aid    Post-procedure details: Patient was observed during the procedure. Post-procedure instructions were reviewed.  Patient left the clinic in stable condition.

## 2019-03-01 NOTE — Progress Notes (Signed)
Megan [name] - 85 y.o. female MRN [id number]  Date of birth: 01-28-1935  Office Visit Note: Visit Date: 12/08/2018 PCP: Merrilee Seashore, MD Referred by: Servando Salina, MD  Subjective: Chief Complaint  Patient presents with  . Lower Back - Pain  . Right Leg - Pain  . Left Leg - Pain   HPI: Megan [name] is a 85 y.o. female who comes in today For planned right L5 and a left L4 transforaminal epidural steroid injection.  Patient has stenosis at L3-4 and L4-5 but also has lateral recess narrowing particularly affecting the L5 nerve root on the right.  Prior injection and July gave her a great deal of relief and this was the same injection at that time.  She is having very similar symptoms with no new trauma etc.  Her situation is complicated by diabetes.  She has a long history of back problems with significant stenosis.  She can consider surgical evaluation and I think with her health and at 54 she might do well with that if it could be a simple decompression.  She obviously wants to try to avoid that.  She has had no focal weakness.  The patient has failed conservative care including home exercise, medications, time and activity modification.  This injection will be diagnostic and hopefully therapeutic.  Please see requesting physician notes for further details and justification.   ROS Otherwise per HPI.  Assessment & Plan: Visit Diagnoses:  1. Lumbar radiculopathy   2. Spinal stenosis of lumbar region with neurogenic claudication     Plan: No additional findings.   Meds & Orders:  Meds ordered this encounter  Medications  . betamethasone acetate-betamethasone sodium phosphate (CELESTONE) injection 12 mg    Orders Placed This Encounter  Procedures  . XR C-ARM NO REPORT  . Epidural Steroid injection    Follow-up: Return if symptoms worsen or fail to improve.   Procedures: No procedures performed  Lumbosacral Transforaminal Epidural Steroid Injection - Sub-Pedicular  Approach with Fluoroscopic Guidance  [name]      Date of Birth: Jul 18, 1934 MRN: [id number] PCP: Merrilee Seashore, MD      Visit Date: 12/08/2018   Universal Protocol:    Date/Time: 12/08/2018  Consent Given By: the patient  Position: PRONE  Additional Comments: Vital signs were monitored before and after the procedure. Patient was prepped and draped in the usual sterile fashion. The correct patient, procedure, and site was verified.   Injection Procedure Details:  Procedure Site One Meds Administered:  Meds ordered this encounter  Medications  . betamethasone acetate-betamethasone sodium phosphate (CELESTONE) injection 12 mg    Laterality: Right, Left  Location/Site:  L5-S1, L4-5  Needle size: 22 G  Needle type: Spinal  Needle Placement: Transforaminal  Findings:    -Comments: Excellent flow of contrast along the nerve and into the epidural space.  Procedure Details: After squaring off the end-plates to get a true AP view, the C-arm was positioned so that an oblique view of the foramen as noted above was visualized. The target area is just inferior to the "nose of the scotty dog" or sub pedicular. The soft tissues overlying this structure were infiltrated with 2-3 ml. of 1% Lidocaine without Epinephrine.  The spinal needle was inserted toward the target using a "trajectory" view along the fluoroscope beam.  Under AP and lateral visualization, the needle was advanced so it did not puncture dura and was located close the 6 O'Clock position of the pedical in  AP tracterory. Biplanar projections were used to confirm position. Aspiration was confirmed to be negative for CSF and/or blood. A 1-2 ml. volume of Isovue-250 was injected and flow of contrast was noted at each level. Radiographs were obtained for documentation purposes.   After attaining the desired flow of contrast documented above, a 0.5 to 1.0 ml test dose of 0.25% Marcaine was injected into each  respective transforaminal space.  The patient was observed for 90 seconds post injection.  After no sensory deficits were reported, and normal lower extremity motor function was noted,   the above injectate was administered so that equal amounts of the injectate were placed at each foramen (level) into the transforaminal epidural space.   Additional Comments:  The patient tolerated the procedure well Dressing: 2 x 2 sterile gauze and Band-Aid    Post-procedure details: Patient was observed during the procedure. Post-procedure instructions were reviewed.  Patient left the clinic in stable condition.     Clinical History: MRI LUMBAR SPINE WITHOUT CONTRAST  TECHNIQUE: Multiplanar, multisequence MR imaging of the lumbar spine was performed. No intravenous contrast was administered.  COMPARISON:  MRI lumbar spine 08/14/2004.  FINDINGS: Segmentation:  Standard.  Alignment: 0.6 cm anterolisthesis L4 on L5 due to facet arthropathy is new since the prior examination. Trace anterolisthesis L5 on S1 is unchanged.  Vertebrae:  No fracture or worrisome lesion.  Conus medullaris: Extends to the L1-2 level and appears normal.  Paraspinal and other soft tissues: Negative.  Disc levels:  T11-12 is imaged in the sagittal plane only. There is a minimal central protrusion without stenosis.  T12-L1:  Minimal right paracentral protrusion without stenosis.  L1-2:  Negative.  L2-3: Mild facet degenerative disease and a minimal disc bulge without central canal or foraminal stenosis.  L3-4: Moderate facet arthropathy, ligamentum flavum thickening and a shallow disc bulge. There is moderate central canal stenosis with narrowing in the subarticular recesses which could impact either descending L4 root. The foramina are open. Spondylosis has worsened since the prior MRI.  L4-5: Advanced facet degenerative disease and bulky ligamentum flavum thickening are seen. The disc is  uncovered with a shallow bulge. There is severe central canal and bilateral subarticular recess narrowing. Mild left foraminal narrowing is seen. The right foramen is open. Spondylosis is much worse than on the prior MRI.  L5-S1: Advanced facet degenerative disease is worse on the left. There is a shallow broad-based central protrusion and ligamentum flavum thickening. Narrowing is seen in the subarticular recesses. Spondylosis has worsened since the prior MRI.  IMPRESSION: Worsened spondylosis at L4-5 where there is severe central canal and bilateral subarticular recess narrowing due to bulky ligamentum flavum thickening and a shallow disc bulge. Advanced facet arthropathy at this level results in 0.6 cm anterolisthesis.  Worsened spondylosis at L3-4 where there is moderate central canal stenosis and narrowing in the subarticular recesses which could impact either descending L4 root.  Some progression spondylosis at L5-S1 where there is a shallow broad-based central protrusion and facet arthropathy resulting in narrowing in the subarticular recesses which could irritate either descending S1 root.   Electronically Signed   By: Inge Rise M.D.   On: 11/26/2016 12:04   She reports that she has never smoked. She has never used smokeless tobacco.  Recent Labs    03/02/18 0923  HGBA1C 7.7*    Objective:  VS:  HT:    WT:   BMI:     BP:130/74  HR:77bpm  TEMP: ( )  RESP:  Physical Exam  Ortho Exam Imaging: No results found.  Past Medical/Family/Surgical/Social History: Medications & Allergies reviewed per EMR, new medications updated. Patient Active Problem List   Diagnosis Date Noted  . Dyspnea on exertion 03/23/2018  . Other secondary pulmonary hypertension ([place]) 03/23/2018  . S/P TAVR (transcatheter aortic valve replacement) 03/03/2018  . Diabetes mellitus without complication ([place])   . Hypertension   . Hypothyroidism   . Anterior ischemic optic  neuropathy of right eye 05/28/2017  . Snoring 05/28/2017  . OSA (obstructive sleep apnea) 05/28/2017  . Shoulder arthritis 10/29/2016  . Severe aortic stenosis 06/21/2016  . Lightheaded 06/05/2016  . Poor balance 06/05/2016  . Sensorineural hearing loss (SNHL), bilateral 02/02/2016  . Arthritis of shoulder region, left, degenerative 07/14/2014   Past Medical History:  Diagnosis Date  . Anemia   . Arthritis   . Cancer ([place]) 1989   melanoma  . Diabetes mellitus without complication ([place])   . Dizziness   . GERD (gastroesophageal reflux disease)   . History of hiatal hernia   . Hypertension   . Hypothyroidism   . IBS (irritable bowel syndrome)   . S/P TAVR (transcatheter aortic valve replacement) 03/03/2018   s/p 23 mm Edwards Sapien THV via the TF approach  . Severe aortic stenosis    Family History  Problem Relation Age of Onset  . Hypertension Mother   . Hypertension Father   . Heart disease Father   . Heart disease Brother    Past Surgical History:  Procedure Laterality Date  . APPENDECTOMY     85 yrs old  . CHOLECYSTECTOMY  89  . COLONOSCOPY    . EYE SURGERY Bilateral 2017   cataract surgery with lens implants  . JOINT REPLACEMENT Right    knee   . REVERSE SHOULDER ARTHROPLASTY Left 07/14/2014  . REVERSE SHOULDER ARTHROPLASTY Left 07/14/2014   Procedure: LEFT REVERSE SHOULDER ARTHROPLASTY;  Surgeon: Meredith Pel, MD;  Location: [facility];  Service: Orthopedics;  Laterality: Left;  . RIGHT/LEFT HEART CATH AND CORONARY ANGIOGRAPHY N/A 01/29/2018   Procedure: RIGHT/LEFT HEART CATH AND CORONARY ANGIOGRAPHY;  Surgeon: Belva Crome, MD;  Location: [facility];  Service: Cardiovascular;  Laterality: N/A;  . SHOULDER ARTHROSCOPY Left   . TEE WITHOUT CARDIOVERSION N/A 03/03/2018   Procedure: TRANSESOPHAGEAL ECHOCARDIOGRAM (TEE);  Surgeon: Sherren Mocha, MD;  Location: [facility];  Service: Open Heart Surgery;  Laterality: N/A;  . TOTAL SHOULDER REVISION  Right 10/29/2016   Procedure: RIGHT REVERSE TOTAL SHOULDER;  Surgeon: Meredith Pel, MD;  Location: [facility];  Service: Orthopedics;  Laterality: Right;  . TRANSCATHETER AORTIC VALVE REPLACEMENT, TRANSFEMORAL N/A 03/03/2018   Procedure: TRANSCATHETER AORTIC VALVE REPLACEMENT, TRANSFEMORAL;  Surgeon: Sherren Mocha, MD;  Location: [facility];  Service: Open Heart Surgery;  Laterality: N/A;   Social History   Occupational History  . Not on file  Tobacco Use  . Smoking status: Never Smoker  . Smokeless tobacco: Never Used  Substance and Sexual Activity  . Alcohol use: No  . Drug use: No  . Sexual activity: Not on file

## 2019-03-10 ENCOUNTER — Ambulatory Visit: Payer: Medicare Other | Admitting: Physician Assistant

## 2019-03-10 ENCOUNTER — Ambulatory Visit (HOSPITAL_COMMUNITY): Payer: Medicare Other | Attending: Internal Medicine

## 2019-03-10 ENCOUNTER — Other Ambulatory Visit: Payer: Self-pay

## 2019-03-10 ENCOUNTER — Encounter: Payer: Self-pay | Admitting: Physician Assistant

## 2019-03-10 VITALS — BP 160/82 | Ht 64.0 in | Wt 196.5 lb

## 2019-03-10 DIAGNOSIS — K228 Other specified diseases of esophagus: Secondary | ICD-10-CM | POA: Diagnosis not present

## 2019-03-10 DIAGNOSIS — Z952 Presence of prosthetic heart valve: Secondary | ICD-10-CM | POA: Diagnosis not present

## 2019-03-10 DIAGNOSIS — I10 Essential (primary) hypertension: Secondary | ICD-10-CM | POA: Diagnosis not present

## 2019-03-10 DIAGNOSIS — K2289 Other specified disease of esophagus: Secondary | ICD-10-CM

## 2019-03-10 NOTE — Patient Instructions (Signed)
Medication Instructions:  Your provider recommends that you continue on your current medications as directed. Please refer to the Current Medication list given to you today.    Follow-Up: Your provider recommends that you schedule a follow-up appointment in August 2021 with Dr. Johnsie Cancel. You will get a letter to call and arrange your visit.  Thank you for letting us take care of you!

## 2019-03-10 NOTE — Progress Notes (Signed)
[facility]                                       Cardiology Office Note    Date:  03/10/2019   ID:  Megan [name], DOB 08-27-1934, MRN [id number]  PCP:  Merrilee Seashore, MD  Cardiologist: Jenkins Rouge, MD /Dr. Burt Knack & Dr. Cyndia Bent (TAVR)  CC: 1 year s/p TAVR   History of Present Illness:  Megan [name] is a 85 y.o. female with a history of DMT2, RBBB, HTN, hypothyroidism and severe AS s/p TAVR (03/03/18) who presents to clinic for follow up.   Patient had an episode ofdizziness in the past and was referred to neurology due to concerns about the possibility of having a stroke. MRI negative for CVA. She has been on plavix since that time.She was noted to have a heart murmur on exam and was referred to Dr. Johnsie Cancel in 2018. An echocardiogram in December 2018 showed moderate aortic stenosis.A follow-up echocardiogram on 01/23/2018 showed progression of her aortic stenosis to severe with a mean gradient of 39 mmHg and a peak gradient of 67 mmHg. The valve area was measured at 0.68 cm with a dimensionless index of 0.24. Left ventricular systolic function remainednormal. She reportedthat she haddeveloped exertional fatigue and shortness of breath that occurs with any significant work around the house or walking long distances. L/RHC confirmed severe AS with normal cors.  She underwent successful TAVR with a78mm Edwards Sapien 3 THV via the TF approach on 03/03/18. Post operative echoshowed EF 65%, normally functioning TAVR withmildly elevatedmean gradient of 40mm Hg and no PVL  She was discharged home on ASA and plavix. She has had a lot of improvement in her symptoms since her TAVR. She was seen by Dr. Lamonte Sakai in the office for evaluation of pulmonary HTN. He felt it was mostly related to left sided heart disease related to her severe AS and should hopefully improve now that she has had TAVR.  Today she presents to clinic for follow up.  She walks 15 minutes twice a day with no issues. Mostly limited by knee and back pain. She stays busy doing house work and going grocery shopping. No CP or SOB. No LE edema, orthopnea or PND. Has had some dizziness but no syncope. She had a brain MRI to work up dizziness that was okay. No blood in stool or urine. No palpitations. She has been staying at home and waiting on a list to be vaccinated.    Past Medical History:  Diagnosis Date  . Anemia   . Arthritis   . Cancer ([place]) 1989   melanoma  . Diabetes mellitus without complication ([place])   . Dizziness   . GERD (gastroesophageal reflux disease)   . History of hiatal hernia   . Hypertension   . Hypothyroidism   . IBS (irritable bowel syndrome)   . S/P TAVR (transcatheter aortic valve replacement) 03/03/2018   s/p 23 mm Edwards Sapien THV via the TF approach  . Severe aortic stenosis     Past Surgical History:  Procedure Laterality Date  . APPENDECTOMY     85 yrs old  . CHOLECYSTECTOMY  95  . COLONOSCOPY    . EYE SURGERY Bilateral 2017   cataract surgery with lens implants  . JOINT REPLACEMENT Right    knee   . REVERSE SHOULDER ARTHROPLASTY  Left 07/14/2014  . REVERSE SHOULDER ARTHROPLASTY Left 07/14/2014   Procedure: LEFT REVERSE SHOULDER ARTHROPLASTY;  Surgeon: Meredith Pel, MD;  Location: [facility];  Service: Orthopedics;  Laterality: Left;  . RIGHT/LEFT HEART CATH AND CORONARY ANGIOGRAPHY N/A 01/29/2018   Procedure: RIGHT/LEFT HEART CATH AND CORONARY ANGIOGRAPHY;  Surgeon: Belva Crome, MD;  Location: [facility];  Service: Cardiovascular;  Laterality: N/A;  . SHOULDER ARTHROSCOPY Left   . TEE WITHOUT CARDIOVERSION N/A 03/03/2018   Procedure: TRANSESOPHAGEAL ECHOCARDIOGRAM (TEE);  Surgeon: Sherren Mocha, MD;  Location: [facility];  Service: Open Heart Surgery;  Laterality: N/A;  . TOTAL SHOULDER REVISION Right 10/29/2016   Procedure: RIGHT REVERSE TOTAL SHOULDER;  Surgeon: Meredith Pel, MD;  Location:  [facility];  Service: Orthopedics;  Laterality: Right;  . TRANSCATHETER AORTIC VALVE REPLACEMENT, TRANSFEMORAL N/A 03/03/2018   Procedure: TRANSCATHETER AORTIC VALVE REPLACEMENT, TRANSFEMORAL;  Surgeon: Sherren Mocha, MD;  Location: [facility];  Service: Open Heart Surgery;  Laterality: N/A;    Current Medications: Outpatient Medications Prior to Visit  Medication Sig Dispense Refill  . amLODipine-valsartan (EXFORGE) 5-320 MG tablet Take 0.5 tablets by mouth daily.    Marland Kitchen ACCU-CHEK AVIVA PLUS test strip 1 each by Other route 2 (two) times daily.     Marland Kitchen ACCU-CHEK SOFTCLIX LANCETS lancets 1 each by Other route 2 (two) times daily.     Marland Kitchen acetaminophen (TYLENOL) 500 MG tablet Take 500-1,000 mg by mouth every 6 (six) hours as needed (pain.).    Marland Kitchen aspirin 81 MG chewable tablet Chew 1 tablet (81 mg total) by mouth daily.    . calcium-vitamin D (OSCAL WITH D) 500-200 MG-UNIT tablet Take 1 tablet by mouth daily at 12 noon.     Marland Kitchen econazole nitrate 1 % cream Apply 1 application topically daily. For nose/corners of mouth    . Ergocalciferol (VITAMIN D2 PO) Take 2,000 mg by mouth.    . gabapentin (NEURONTIN) 100 MG capsule TAKE 1 CAPSULE (100 MG TOTAL) BY MOUTH AT BEDTIME. 180 capsule 2  . glimepiride (AMARYL) 2 MG tablet Take 2-4 mg by mouth See admin instructions. Take 2 mg by mouth in the morning and4 mg by mouth at night.  12  . hydrocortisone 2.5 % cream Apply 1 application topically daily. Applied to nose/corners of mouth    . Multiple Vitamin (MULTIVITAMIN WITH MINERALS) TABS tablet Take 1 tablet by mouth daily at 12 noon.     Marland Kitchen MYRBETRIQ 50 MG TB24 tablet Take 50 mg by mouth daily.  11  . Omega-3 Fatty Acids (FISH OIL) 1200 MG CAPS Take 1,200 mg by mouth daily at 12 noon.     . rosuvastatin (CRESTOR) 5 MG tablet Take 5 mg by mouth daily.    . sitaGLIPtin (JANUVIA) 100 MG tablet Take 100 mg by mouth daily.    Marland Kitchen SYNTHROID 125 MCG tablet Take 125 mcg by mouth daily before breakfast. Take 6 days a  week  12  . triamterene-hydrochlorothiazide (DYAZIDE) 37.5-25 MG per capsule Take 1 capsule by mouth at bedtime.   11  . amLODipine-valsartan (EXFORGE) 5-320 MG tablet Take 0.5 tablets by mouth daily. (Patient taking differently: Take 0.5 tablets by mouth daily. Pt takes 5-160mg ) 15 tablet 11   No facility-administered medications prior to visit.     Allergies:   Patient has no known allergies.   Social History   Socioeconomic History  . Marital status: Widowed    Spouse name: Not on file  .  Number of children: Not on file  . Years of education: Not on file  . Highest education level: Not on file  Occupational History  . Not on file  Tobacco Use  . Smoking status: Never Smoker  . Smokeless tobacco: Never Used  Substance and Sexual Activity  . Alcohol use: No  . Drug use: No  . Sexual activity: Not on file  Other Topics Concern  . Not on file  Social History Narrative  . Not on file   Social Determinants of Health   Financial Resource Strain:   . Difficulty of Paying Living Expenses: Not on file  Food Insecurity:   . Worried About Charity fundraiser in the Last Year: Not on file  . Ran Out of Food in the Last Year: Not on file  Transportation Needs:   . Lack of Transportation (Medical): Not on file  . Lack of Transportation (Non-Medical): Not on file  Physical Activity:   . Days of Exercise per Week: Not on file  . Minutes of Exercise per Session: Not on file  Stress:   . Feeling of Stress : Not on file  Social Connections:   . Frequency of Communication with Friends and Family: Not on file  . Frequency of Social Gatherings with Friends and Family: Not on file  . Attends Religious Services: Not on file  . Active Member of Clubs or Organizations: Not on file  . Attends Archivist Meetings: Not on file  . Marital Status: Not on file     Family History:  The patient's family history includes Heart disease in her brother and father; Hypertension in her  father and mother.      ROS:   Please see the history of present illness.    ROS All other systems reviewed and are negative.   PHYSICAL EXAM:   VS:  BP (!) 160/82   Ht 5\' 4"  (1.626 m)   Wt 196 lb 8 oz (89.1 kg) Comment: 192.6 lbs  LMP  (LMP Unknown)   BMI 33.73 kg/m    GEN: Well nourished, well developed, in no acute distress HEENT: normal Neck: no JVD or masses Cardiac: RRR;soft flow murmur. No rubs, or gallops,no edema  Respiratory:  clear to auscultation bilaterally, normal work of breathing GI: soft, nontender, nondistended, + BS MS: no deformity or atrophy Skin: warm and dry, no rash.  Neuro:  Alert and Oriented x 3, Strength and sensation are intact Psych: euthymic mood, full affect   Wt Readings from Last 3 Encounters:  03/10/19 196 lb 8 oz (89.1 kg)  12/02/18 196 lb (88.9 kg)  10/16/18 195 lb (88.5 kg)      Studies/Labs Reviewed:   EKG:  EKG is NOT ordered today.    Recent Labs: No results found for requested labs within last 8760 hours.   Lipid Panel No results found for: CHOL, TRIG, HDL, CHOLHDL, VLDL, LDLCALC, LDLDIRECT  Additional studies/ records that were reviewed today include:  TAVR OPERATIVE NOTE   Date of Procedure:03/03/2018  Preoperative Diagnosis:Severe Aortic Stenosis   Postoperative Diagnosis:Same   Procedure:   Transcatheter Aortic Valve Replacement - PercutaneousRightTransfemoral Approach Edwards Sapien 3 THV (size 24mm, model # 9600TFX, serial # [id number])  Co-Surgeons:Bryan Alveria Apley, MD and Sherren Mocha, MD   Anesthesiologist:Stephen Gifford Shave, MD  Anne Hahn, MD  Pre-operative Echo Findings: ? Severe aortic stenosis ? Normalleft ventricular systolic function  Post-operative Echo Findings: ? Noparavalvular leak ? Normalleft ventricular systolic function  _______________  Echo  03/04/18: IMPRESSIONS 1. The left ventricle has hyperdynamic systolic function of [id number]. The cavity size is normal. There is no increased left ventricular wall thickness. Echo evidence of impaired diastolic relaxation. 2. The right ventricle has normal systolic function. The cavity in normal in size. There is no increase in right ventricular wall thickness. Right ventricular systolic pressure is mildly elevated with an estimated pressure of 43.0 mmHg. 3. The mitral valve is normal in structure There is moderate thickening and moderate calcification. There is moderate mitral annular calcification present. moderate mitral valve stenosis. 4. A 78mm Edwards Sapien bioprosthetic aortic valve valve is present in the aortic position. Procedure Date: 03/03/2018 Normal aortic valve prosthesis. 5. There is moderate thickening and severe calcifcation of the aortic valve. 6. TAVR mean gradient mildly increased (71mmHg). Valve is structurally stable without significant regurgitation or stenosis. 7. The inferior vena cava was dilated in size with >50% respiratory variability  _________________  Echo 03/10/19 IMPRESSIONS  1. Left ventricular ejection fraction, by estimation, is 70 to 75%. The left ventricle has hyperdynamic function. The left ventrical has no regional wall motion abnormalities. There is moderately increased concentric left ventricular hypertrophy. Left  ventricular diastolic parameters are consistent with Grade II diastolic dysfunction (pseudonormalization). Elevated left ventricular end-diastolic pressure.  2. Right ventricular systolic function is normal. The right ventricular size is normal. There is normal pulmonary artery systolic pressure. The estimated right ventricular systolic pressure is [id number] mmHg.  3. Left atrial size was mildly dilated.  4. The mitral valve is degenerative. Mild mitral valve regurgitation. Moderate mitral stenosis. The mean mitral valve gradient is 9.0 mmHg.  5.  Elevated transaortic gradients with peak/mean 45/26 mmHg, no paravalvular leak.. The aortic valve has been repaired/replaced. Aortic valve regurgitation is not visualized. Mild to moderate aortic valve stenosis. 48mm Edwards Sapien prosthetic aortic  valve (TAVR) valve is present in the aortic position. Procedure Date: 03/03/18.  6. The inferior vena cava is normal in size with greater than 50% respiratory variability, suggesting right atrial pressure of 3 mmHg.   ASSESSMENT & PLAN:   Severe AS s/p TAVR: echo today shows EF 70%, normally functioning TAVR with mean gradient of 26 mm Hg (19 mm Hg previously) and no PVL. (gradients increased likely 2/2 vigorous LV function). She has NYHA class I symptoms. SBE prophylaxis discussed; she has dentures and doesn't go to the dentist. Continue on aspirin alone.   HTN: Bp elevated initially. 130/60 on my personal recheck. No changes made.   Incidental findings: pre TAVR CT angio showedlong segment on circumferential thickening of thoracic esophagus. DDx: inflammation, tumor. May need EGD. Multiple mediastinal lymph nodes. DDx: chronic granulomatous disease, chronic inflammation from esophagitis/GERD, myeloproliferative disorder, or, if there is occult esophageal tumor, potentially mediastinal metastases." She had an EGD by Dr. Collene Mares in 3/220 which did not show any tumor.     Medication Adjustments/Labs and Tests Ordered: Current medicines are reviewed at length with the patient today.  Concerns regarding medicines are outlined above.  Medication changes, Labs and Tests ordered today are listed in the Patient Instructions below. Patient Instructions  Medication Instructions:  Your provider recommends that you continue on your current medications as directed. Please refer to the Current Medication list given to you today.    Follow-Up: Your provider recommends that you schedule a follow-up appointment in August 2021 with Dr. Johnsie Cancel. You will get a letter  to call and arrange your visit.  Thank you for letting us take care of you!    Signed,  Angelena Form, PA-C  03/10/2019 4:10 PM    [facility] [place], [place], [place]  [address] Phone: [phone]; Fax: [phone]

## 2019-03-11 LAB — ECHOCARDIOGRAM COMPLETE
Height: 64 in
Weight: 3144 oz

## 2019-03-12 ENCOUNTER — Telehealth: Payer: Self-pay | Admitting: Cardiovascular Disease

## 2019-03-12 NOTE — Telephone Encounter (Signed)
Patient states she is returning a call, requesting echocardiogram results from echocardiogram completed on 03/10/19. Please call to discuss.

## 2019-03-12 NOTE — Progress Notes (Signed)
TAVR valve looks good

## 2019-03-12 NOTE — Telephone Encounter (Signed)
I spoke with the pt and reviewed results of Echocardiogram.  The pt will be due for follow-up in September with Dr Johnsie Cancel per recall in the system.

## 2019-03-24 DIAGNOSIS — H35373 Puckering of macula, bilateral: Secondary | ICD-10-CM | POA: Diagnosis not present

## 2019-03-24 DIAGNOSIS — E119 Type 2 diabetes mellitus without complications: Secondary | ICD-10-CM | POA: Diagnosis not present

## 2019-03-24 DIAGNOSIS — H47011 Ischemic optic neuropathy, right eye: Secondary | ICD-10-CM | POA: Diagnosis not present

## 2019-03-24 DIAGNOSIS — H35033 Hypertensive retinopathy, bilateral: Secondary | ICD-10-CM | POA: Diagnosis not present

## 2019-04-05 ENCOUNTER — Other Ambulatory Visit: Payer: Self-pay

## 2019-04-05 ENCOUNTER — Ambulatory Visit: Payer: Medicare Other | Admitting: Neurology

## 2019-04-05 ENCOUNTER — Telehealth: Payer: Self-pay | Admitting: *Deleted

## 2019-04-05 ENCOUNTER — Encounter: Payer: Self-pay | Admitting: Neurology

## 2019-04-05 VITALS — BP 155/76 | HR 89 | Temp 97.1°F | Ht 64.0 in | Wt 195.5 lb

## 2019-04-05 DIAGNOSIS — Z952 Presence of prosthetic heart valve: Secondary | ICD-10-CM | POA: Diagnosis not present

## 2019-04-05 DIAGNOSIS — R55 Syncope and collapse: Secondary | ICD-10-CM

## 2019-04-05 DIAGNOSIS — H81399 Other peripheral vertigo, unspecified ear: Secondary | ICD-10-CM | POA: Diagnosis not present

## 2019-04-05 DIAGNOSIS — H903 Sensorineural hearing loss, bilateral: Secondary | ICD-10-CM

## 2019-04-05 DIAGNOSIS — I059 Rheumatic mitral valve disease, unspecified: Secondary | ICD-10-CM

## 2019-04-05 NOTE — Telephone Encounter (Signed)
Patient enrolled for [employer] to mail a 3 day ZIO XT long term holter monitor to her home.  Instructions included in the monitor kit.

## 2019-04-05 NOTE — Progress Notes (Signed)
[facility]  [name] DOB: 03/12/1934  REFERRING DOCTOR OR PCP:  [facility] is PCP; Dr. Herbert Deaner is ophthalmologist SOURCE: patient, notes from Dr. Wilson Singer, lab reports  _________________________________   HISTORICAL  CHIEF COMPLAINT:  Chief Complaint  Patient presents with  . Follow-up    RM 12, alone. Last seen 12/02/2018. No falls since last seen. Vertigo has improved. Saw cardiologist recently and they think vertigo d/t thyroid issues. They are working on getting her level back in normal range. This past Sat she was sitting on couch speaking with son and felt like she was going to pass out(lasted around 1 min). Last episode 09/2018.     HISTORY OF PRESENT ILLNESS:   Update 04/05/2019: She reports that her vertigo has improved.    She had a recent episode of presyncope last week while sitting on a couch for a while (no position change).   She thought she would pass out.   This episode lasted one minute.    She had no vertigo.  This was the first one since September (episodes have happened while sitting and laying down).    She had carotid dopplers 02/17/2018 which were normal for age.     MRI brain 12/30/2018 showed age related changes but no VIIIth nerve or brainstem abnormality.   MRI of the cervical spine showed mild spinal stenosis at C3-C4 but the spinal cord looked fine.    She had a transcatheter AVR 02/2018 and it looked good on recent Echo.  She also has moderate mitral valve stenosis.    She has scar tissue in her eye since cataract surgery and will be having a laser treatment.     Update 12/02/2018: Megan [name] is an 85 y.o. woman who I have previously seen for right nonarteritic anterior ischemic optic neuropathy and poor balance and OSA now reporting positional vertigo.  She has had some issues off/on with balance and dizziness.   Her current symptoms are similar but occur more frequently.  She gets episodes of  vertigo lasting about one minute and feels like she is being one way or the other but left > right.   She recalls one episode rolling over in bed and another episode just turning her head to take to he son in the kitchen that triggered an event..   Looking up has triggered a couple spell.       She also notes neck pain.   She has been told she may have a sprained muscle   Even with a muscle relaxant and a shot (steroid?) she has had pain.     Update 09/30/2017: Megan [name] is an 85 yo woman with a history of right nonarteritic anterior ischemic optic neuropathy and poor balance.  Since the last visit, vision has improved and is much better, though not baseline.    We ordered a PSG and it showed mild OSA (AHI = 8.2).    She has only mild sleepiness in the afternoons.   She is trying to lose weight.   She ahs had some fluctuations with changed in DM medications.     She continues to have some spells of lightheadedness with gait ataxia.  She has only one actual fall, occurring with severe lightheadedness during the first episode.    No episode of syncope.  She does have aortic stenosis and is followed closely by cardiology.  Balance is worse right after she stands up, especially in the morning from bed.  She has some urinary frequency, helped by Myrbetriq.  No leg weakness.      I personally reviewed the CT scan of the head from 01/04/2016.  Brain volume was normal for age.  Mild chronic microvascular ischemic changes.  Some calcification with in the arteries at the skull base.  Update 05/28/2017: Megan [name] is a 85 year old woman who I have previously seen for poor balance here today for evaluation of daytime sleepiness and concern about obstructive sleep apnea.  She had the onset of painless right vision loss starting in May 2018 and saw her ophthalmologist, Dr. Herbert Deaner who noted optic disc edema.  She referred her to Dr. Hassell Done, Neuro-ophthalmologist at [facility], who diagnosed with nonarteritic anterior  ischemic optic neuropathy.  Both he and her cardiologist are concerned about OSA.     She has had snoring and mild excessive daytime sleepiness.  She has been told that her snoring is loud and she sometimes makes some other noises with her breathing.  She typically goes to bed at 930 to 10 pm and falls asleep in a minute or two.   After 4 hours or so, she wakes up and gets out of bed x 2 hours and then goes back to sleep for a couple more hours.     EPWORTH SLEEPINESS SCALE  On a scale of 0 - 3 what is the chance of dozing:  Sitting and Reading:   1 Watching TV:    3 Sitting inactive in a public place: 0 Passenger in car for one hour: 3 Lying down to rest in the afternoon: 0 Sitting and talking to someone: 0 Sitting quietly after lunch:  1 In a car, stopped in traffic:  0  Total (out of 24):   8/24 normal  She still has had a few more spells of reduced balance and lightheadedness.   She has aortic stenosis and hypertension.   She is on amlodipine/valsartan and Dyazide for HTN.   She also has NIDDM.     From 10/08/2016: Since the last visit, she has not had any more of the episodes of severe lightheadedness with some vertigo. An echocardiogram showed she had moderate aortic stenosis and she was seen by Dr. Johnsie Cancel of cardiology 9stufies and notes reviewed). He is going to follow her echocardiograms and a procedure may be necessary at some point.    She feels unsteady going up and down stairs and needs to hold on.   She has stress incontinence and urgency.   She denies neck pain.      She has no more falls. She did fall last year and a head CT scan 01/17/2016 did not show any evidence of stroke though she did have mild chronic microvascular ischemic change.  She denies any weakness in her legs but she has some numbness and pain and sees Pain management who has done epidural steroid injections.   She feels some of her unsteadiness is coming from her back and knees.   She has some urinary  frequency helped by medication. This is chronic.    REVIEW OF SYSTEMS: Constitutional: No fevers, chills, sweats, or change in appetite.   Mild fatigue Eyes: See above Ear, nose and throat: No hearing loss, ear pain, nasal congestion, sore throat Cardiovascular: No chest pain, palpitations Respiratory: No shortness of breath at rest or with exertion.   No wheezes GastrointestinaI: No nausea, vomiting, diarrhea, abdominal pain, fecal incontinence Genitourinary: No dysuria, urinary retention or frequency.  No nocturia. Musculoskeletal:She reports neck and  shoulder pain at times.  There is some back pain.  Integumentary: No rash, pruritus, skin lesions Neurological: as above Psychiatric: No depression at this time.  No anxiety Endocrine: No palpitations, diaphoresis, change in appetite, change in weigh or increased thirst Hematologic/Lymphatic: No anemia, purpura, petechiae. Allergic/Immunologic: No itchy/runny eyes, nasal congestion, recent allergic reactions, rashes  ALLERGIES: No Known Allergies  HOME MEDICATIONS:  Current Outpatient Medications:  .  ACCU-CHEK AVIVA PLUS test strip, 1 each by Other route 2 (two) times daily. , Disp: , Rfl:  .  ACCU-CHEK SOFTCLIX LANCETS lancets, 1 each by Other route 2 (two) times daily. , Disp: , Rfl:  .  acetaminophen (TYLENOL) 500 MG tablet, Take 500-1,000 mg by mouth every 6 (six) hours as needed (pain.)., Disp: , Rfl:  .  amLODipine-valsartan (EXFORGE) 5-320 MG tablet, Take 0.5 tablets by mouth daily., Disp: , Rfl:  .  aspirin 81 MG chewable tablet, Chew 1 tablet (81 mg total) by mouth daily., Disp: , Rfl:  .  calcium-vitamin D (OSCAL WITH D) 500-200 MG-UNIT tablet, Take 1 tablet by mouth daily at 12 noon. , Disp: , Rfl:  .  econazole nitrate 1 % cream, Apply 1 application topically daily. For nose/corners of mouth, Disp: , Rfl:  .  Ergocalciferol (VITAMIN D2 PO), Take 2,000 mg by mouth., Disp: , Rfl:  .  gabapentin (NEURONTIN) 100 MG capsule,  TAKE 1 CAPSULE (100 MG TOTAL) BY MOUTH AT BEDTIME., Disp: 180 capsule, Rfl: 2 .  glimepiride (AMARYL) 2 MG tablet, Take 2 mg by mouth See admin instructions. Take 2 mg by mouth in the morning and 4 mg by mouth at night., Disp: , Rfl: 12 .  hydrocortisone 2.5 % cream, Apply 1 application topically daily. Applied to nose/corners of mouth, Disp: , Rfl:  .  Multiple Vitamin (MULTIVITAMIN WITH MINERALS) TABS tablet, Take 1 tablet by mouth daily at 12 noon. , Disp: , Rfl:  .  MYRBETRIQ 50 MG TB24 tablet, Take 50 mg by mouth daily., Disp: , Rfl: 11 .  Omega-3 Fatty Acids (FISH OIL) 1200 MG CAPS, Take 1,200 mg by mouth daily at 12 noon. , Disp: , Rfl:  .  rosuvastatin (CRESTOR) 5 MG tablet, Take 5 mg by mouth daily., Disp: , Rfl:  .  sitaGLIPtin (JANUVIA) 100 MG tablet, Take 100 mg by mouth daily., Disp: , Rfl:  .  SYNTHROID 125 MCG tablet, Take 125 mcg by mouth daily before breakfast. Take 6 days a week, Disp: , Rfl: 12 .  triamterene-hydrochlorothiazide (DYAZIDE) 37.5-25 MG per capsule, Take 1 capsule by mouth at bedtime. , Disp: , Rfl: 11  PAST MEDICAL HISTORY: Past Medical History:  Diagnosis Date  . Anemia   . Arthritis   . Cancer ([place]) 1989   melanoma  . Diabetes mellitus without complication ([place])   . Dizziness   . GERD (gastroesophageal reflux disease)   . History of hiatal hernia   . Hypertension   . Hypothyroidism   . IBS (irritable bowel syndrome)   . S/P TAVR (transcatheter aortic valve replacement) 03/03/2018   s/p 23 mm Edwards Sapien THV via the TF approach  . Severe aortic stenosis     PAST SURGICAL HISTORY: Past Surgical History:  Procedure Laterality Date  . APPENDECTOMY     85 yrs old  . CHOLECYSTECTOMY  89  . COLONOSCOPY    . EYE SURGERY Bilateral 2017   cataract surgery with lens implants  . JOINT REPLACEMENT Right    knee   .  REVERSE SHOULDER ARTHROPLASTY Left 07/14/2014  . REVERSE SHOULDER ARTHROPLASTY Left 07/14/2014   Procedure: LEFT REVERSE SHOULDER  ARTHROPLASTY;  Surgeon: Meredith Pel, MD;  Location: [facility];  Service: Orthopedics;  Laterality: Left;  . RIGHT/LEFT HEART CATH AND CORONARY ANGIOGRAPHY N/A 01/29/2018   Procedure: RIGHT/LEFT HEART CATH AND CORONARY ANGIOGRAPHY;  Surgeon: Belva Crome, MD;  Location: [facility];  Service: Cardiovascular;  Laterality: N/A;  . SHOULDER ARTHROSCOPY Left   . TEE WITHOUT CARDIOVERSION N/A 03/03/2018   Procedure: TRANSESOPHAGEAL ECHOCARDIOGRAM (TEE);  Surgeon: Sherren Mocha, MD;  Location: [facility];  Service: Open Heart Surgery;  Laterality: N/A;  . TOTAL SHOULDER REVISION Right 10/29/2016   Procedure: RIGHT REVERSE TOTAL SHOULDER;  Surgeon: Meredith Pel, MD;  Location: [facility];  Service: Orthopedics;  Laterality: Right;  . TRANSCATHETER AORTIC VALVE REPLACEMENT, TRANSFEMORAL N/A 03/03/2018   Procedure: TRANSCATHETER AORTIC VALVE REPLACEMENT, TRANSFEMORAL;  Surgeon: Sherren Mocha, MD;  Location: [facility];  Service: Open Heart Surgery;  Laterality: N/A;    FAMILY HISTORY: Family History  Problem Relation Age of Onset  . Hypertension Mother   . Hypertension Father   . Heart disease Father   . Heart disease Brother     SOCIAL HISTORY:  Social History   Socioeconomic History  . Marital status: Widowed    Spouse name: Not on file  . Number of children: Not on file  . Years of education: Not on file  . Highest education level: Not on file  Occupational History  . Not on file  Tobacco Use  . Smoking status: Never Smoker  . Smokeless tobacco: Never Used  Substance and Sexual Activity  . Alcohol use: No  . Drug use: No  . Sexual activity: Not on file  Other Topics Concern  . Not on file  Social History Narrative  . Not on file   Social Determinants of Health   Financial Resource Strain:   . Difficulty of Paying Living Expenses: Not on file  Food Insecurity:   . Worried About Charity fundraiser in the Last Year: Not on file  . Ran Out of Food  in the Last Year: Not on file  Transportation Needs:   . Lack of Transportation (Medical): Not on file  . Lack of Transportation (Non-Medical): Not on file  Physical Activity:   . Days of Exercise per Week: Not on file  . Minutes of Exercise per Session: Not on file  Stress:   . Feeling of Stress : Not on file  Social Connections:   . Frequency of Communication with Friends and Family: Not on file  . Frequency of Social Gatherings with Friends and Family: Not on file  . Attends Religious Services: Not on file  . Active Member of Clubs or Organizations: Not on file  . Attends Archivist Meetings: Not on file  . Marital Status: Not on file  Intimate Partner Violence:   . Fear of Current or Ex-Partner: Not on file  . Emotionally Abused: Not on file  . Physically Abused: Not on file  . Sexually Abused: Not on file     PHYSICAL EXAM  Vitals:   04/05/19 1051  BP: (!) 155/76  Pulse: 89  Temp: (!) 97.1 F (36.2 C)  Weight: 195 lb 8 oz (88.7 kg)  Height: 5\' 4"  (1.626 m)   No data found.    Body mass index is 33.56 kg/m.   General: The patient is well-developed and  well-nourished and in no acute distress.    The neck has a reduced range of motion.  HEENT: Tympanic membranes are intact.  Ear canals were fairly normal.  Hearing was mildly reduced on the right.  Funduscopic evaluation was difficult due to small pupils.  Cardiovascular: The heart has a regular rate and rhythm no bruits over the carotid arteries.  Skin: Extremities are without rash or edema.  Neurologic Exam  Mental status: The patient is alert and oriented x 3 at the time of the examination. The patient has apparent normal recent and remote memory, with an apparently normal attention span and concentration ability.   Speech is normal.  Cranial nerves: Extraocular movements are full.   Colors are mildly desaturated OD and she had a 1+ right APD.  Facial strength and sensation is normal. Trapezius  strength is normal. The tongue is midline, and the patient has symmetric elevation of the soft palate.  She has reduced hearing, right worse than left..     Motor:  Muscle bulk is normal.   Tone is normal. Strength is  5 / 5 in all 4 extremities.   Sensory:    She has mildly reduced vibratory sensation in the feet and normal sensation elsewhere.  Coordination: Cerebellar testing shows fairly normal finger-to-nose and heel-to-shin bilaterally.  Gait and station: Station is normal.   Gait is mildly arthritic and mildly wide.  Tandem gait is wide but actually better than the last visit.. Romberg is negative.   Reflexes: Deep tendon reflexes are symmetric and normal in arms, 2 at the knees and absent at the toes 2+ at the knees and absent at the ankles.  .     ASSESSMENT AND PLAN  Pre-syncope - Plan: HOLTER MONITOR - 48 HOUR  Sensorineural hearing loss (SNHL), bilateral  S/P TAVR (transcatheter aortic valve replacement) - Plan: HOLTER MONITOR - 48 HOUR  Episodic peripheral vertigo   1.  Her vertigo spells improved but she has had a few episodes of pre-syncope.  As they occur without changes in position most concerned about arrhythmia.     We will check a 48 hour Holter.   If spells persist consider 30 day event monitor. 2.   MRI shows typical age related changes.   Try to eat a heart healthy diet. 3.   Stay active, exercise as tolerated.      3.  She will return to see me in 4 months or sooner if new or worsening symptoms.  Cacie Gaskins A. Felecia Shelling, MD, PhD [id number], [id number] AM Certified in Neurology, Clinical Neurophysiology, Sleep Medicine, Pain Medicine and Neuroimaging  [facility] [address], [place], [place] [address] [phone]

## 2019-04-09 ENCOUNTER — Ambulatory Visit (INDEPENDENT_AMBULATORY_CARE_PROVIDER_SITE_OTHER): Payer: Medicare Other

## 2019-04-09 DIAGNOSIS — Z952 Presence of prosthetic heart valve: Secondary | ICD-10-CM | POA: Diagnosis not present

## 2019-04-09 DIAGNOSIS — R55 Syncope and collapse: Secondary | ICD-10-CM | POA: Diagnosis not present

## 2019-04-19 DIAGNOSIS — Z952 Presence of prosthetic heart valve: Secondary | ICD-10-CM | POA: Diagnosis not present

## 2019-04-19 DIAGNOSIS — R55 Syncope and collapse: Secondary | ICD-10-CM | POA: Diagnosis not present

## 2019-05-06 DIAGNOSIS — R42 Dizziness and giddiness: Secondary | ICD-10-CM | POA: Diagnosis not present

## 2019-05-06 DIAGNOSIS — E782 Mixed hyperlipidemia: Secondary | ICD-10-CM | POA: Diagnosis not present

## 2019-05-06 DIAGNOSIS — H9190 Unspecified hearing loss, unspecified ear: Secondary | ICD-10-CM | POA: Diagnosis not present

## 2019-05-06 DIAGNOSIS — I2510 Atherosclerotic heart disease of native coronary artery without angina pectoris: Secondary | ICD-10-CM | POA: Diagnosis not present

## 2019-05-06 DIAGNOSIS — Z0000 Encounter for general adult medical examination without abnormal findings: Secondary | ICD-10-CM | POA: Diagnosis not present

## 2019-05-06 DIAGNOSIS — E1165 Type 2 diabetes mellitus with hyperglycemia: Secondary | ICD-10-CM | POA: Diagnosis not present

## 2019-05-06 DIAGNOSIS — I10 Essential (primary) hypertension: Secondary | ICD-10-CM | POA: Diagnosis not present

## 2019-05-13 DIAGNOSIS — Z0000 Encounter for general adult medical examination without abnormal findings: Secondary | ICD-10-CM | POA: Diagnosis not present

## 2019-05-13 DIAGNOSIS — I2510 Atherosclerotic heart disease of native coronary artery without angina pectoris: Secondary | ICD-10-CM | POA: Diagnosis not present

## 2019-05-13 DIAGNOSIS — E1165 Type 2 diabetes mellitus with hyperglycemia: Secondary | ICD-10-CM | POA: Diagnosis not present

## 2019-05-13 DIAGNOSIS — I700 Atherosclerosis of aorta: Secondary | ICD-10-CM | POA: Diagnosis not present

## 2019-05-13 DIAGNOSIS — E118 Type 2 diabetes mellitus with unspecified complications: Secondary | ICD-10-CM | POA: Diagnosis not present

## 2019-05-20 NOTE — Addendum Note (Signed)
Addended by: Aris Georgia, Maila Dukes L on: 05/20/2019 04:35 PM   Modules accepted: Orders

## 2019-05-20 NOTE — Telephone Encounter (Signed)
Her monitor was benign with just a few skips from top of heart She still has some mitral valve disease and her TAVR valve was ok Can be seen in September with echo

## 2019-05-20 NOTE — Telephone Encounter (Signed)
Called patient back about her message. Patient stated she had a monitor put on back in March due to having a feeling like she was going to pass out on 04/03/19. She was wondering what was the results were and if she needed another echo. Informed patient that she recently had an echo in February and she is suppose to see Dr. Johnsie Cancel again in September. Informed patient that her monitor was reviewed by Dr. Johnsie Cancel and Dr. Felecia Shelling, and read report to patient. Informed patient that a message would be sent to Dr. Johnsie Cancel for advisement.  Patient stated if everything is okay, that she would see [place] in September.

## 2019-05-20 NOTE — Telephone Encounter (Signed)
Patient states she is requesting to discuss monitor results from ZIO XT long term monitor. She states she is also requesting an order to have an echocardiogram. Please call to discuss.

## 2019-05-20 NOTE — Telephone Encounter (Signed)
Placed order for echo, will have scheduling call patient to schedule echo.

## 2019-05-25 DIAGNOSIS — H905 Unspecified sensorineural hearing loss: Secondary | ICD-10-CM | POA: Diagnosis not present

## 2019-05-25 DIAGNOSIS — Z0100 Encounter for examination of eyes and vision without abnormal findings: Secondary | ICD-10-CM | POA: Diagnosis not present

## 2019-05-25 IMAGING — DX DG SHOULDER 2+V PORT*R*
1 series · 1 of 1 positions shown · non-contrast
Comparison: 07/29/2016

CLINICAL DATA: Status post right reverse total shoulder
arthroplasty.

EXAM:
PORTABLE RIGHT SHOULDER

[shoulder ap]
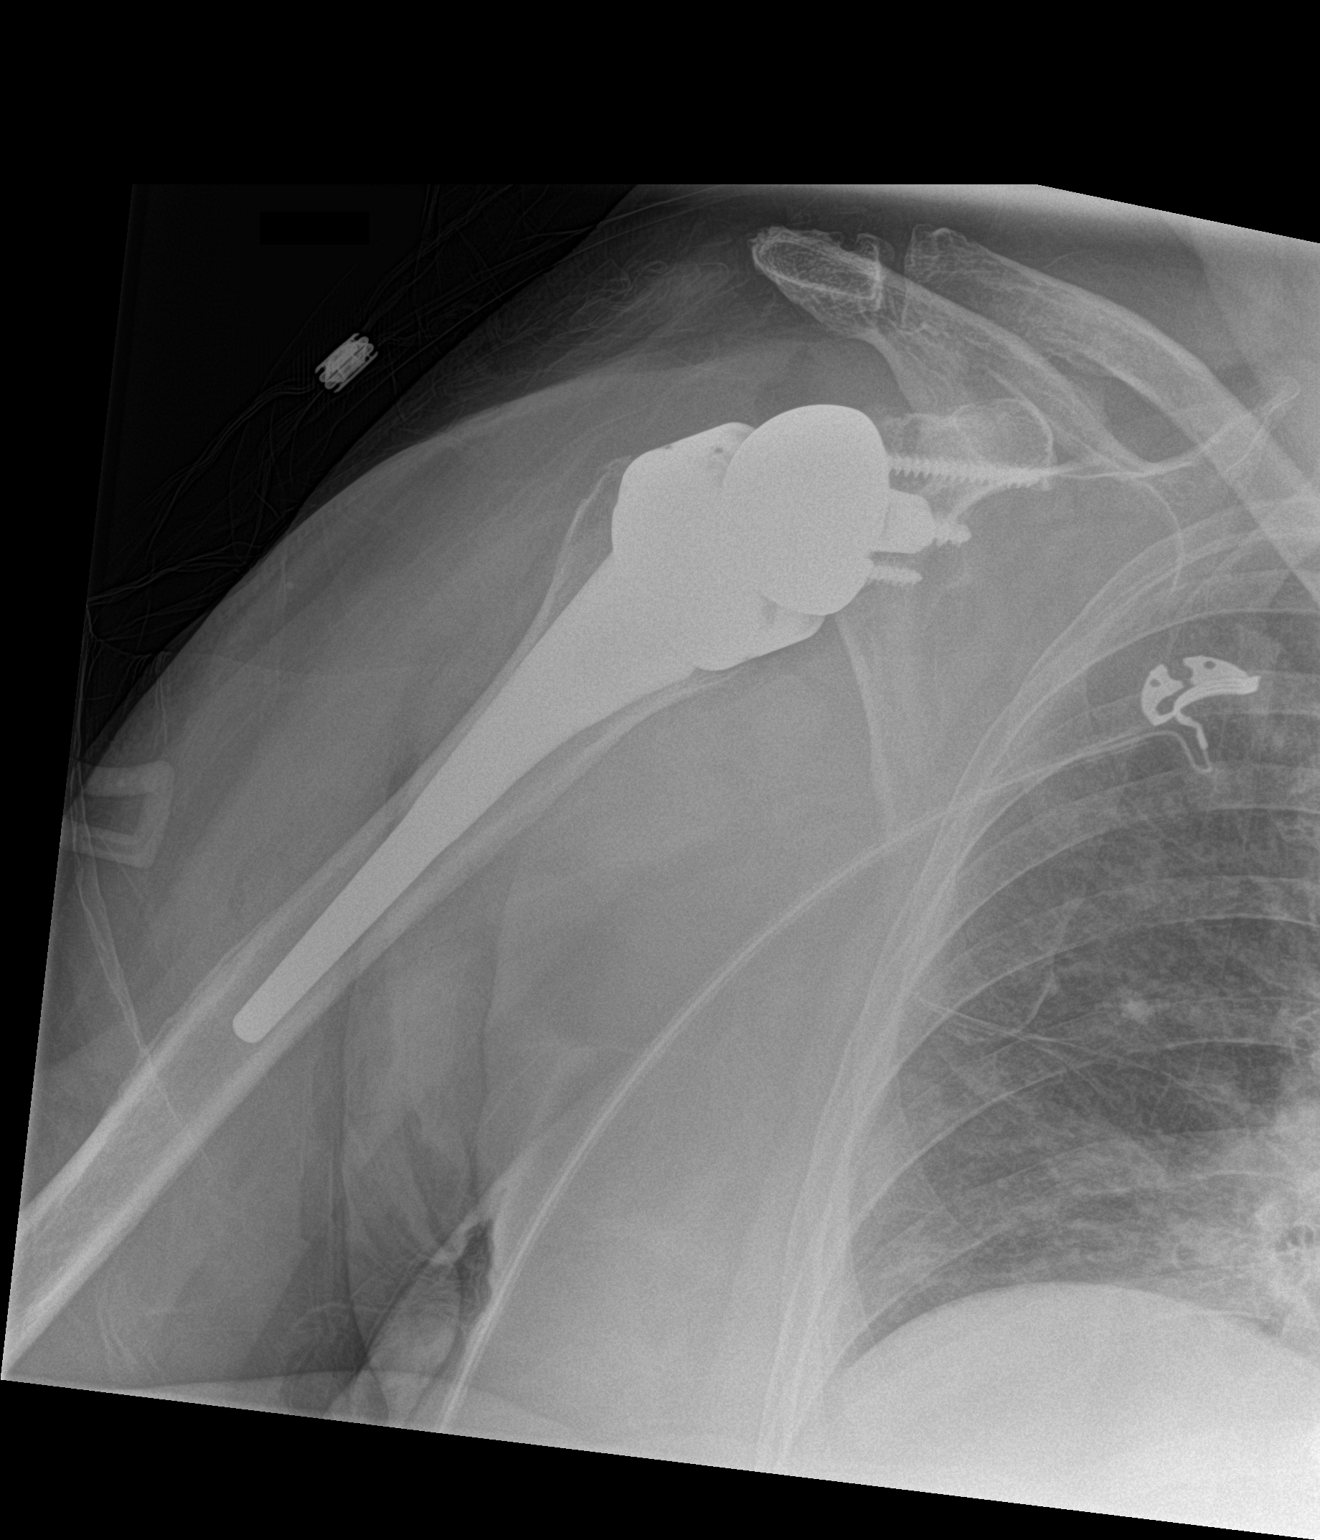

[1 of 1 positions shown; findings below may reference images not displayed]

FINDINGS: Hardware components of a right total shoulder arthroplasty are
identified and appear to be in anatomic alignment. No periprosthetic
fracture or dislocation identified.
IMPRESSION: Status post right total shoulder arthroplasty.

## 2019-06-08 ENCOUNTER — Telehealth: Payer: Self-pay | Admitting: Physical Medicine and Rehabilitation

## 2019-06-08 ENCOUNTER — Other Ambulatory Visit: Payer: Self-pay | Admitting: Internal Medicine

## 2019-06-08 DIAGNOSIS — Z1231 Encounter for screening mammogram for malignant neoplasm of breast: Secondary | ICD-10-CM

## 2019-06-08 NOTE — Telephone Encounter (Signed)
Yes

## 2019-06-08 NOTE — Telephone Encounter (Signed)
Scheduled for 6/8 at 1530 with driver.

## 2019-06-08 NOTE — Telephone Encounter (Signed)
Patient left a message requesting an appointment for an injection. Right L5 and left LF TF on 12/08/18. Ok to repeat if helped, same problem/side, and no new injury?

## 2019-06-22 IMAGING — MR MR LUMBAR SPINE W/O CM
4 of 5 series · 25 of 48 positions shown · non-contrast
Comparison: MRI lumbar spine 08/14/2004.

CLINICAL DATA: Chronic low back pain radiating into both legs.

EXAM:
MRI LUMBAR SPINE WITHOUT CONTRAST
TECHNIQUE: Multiplanar, multisequence MR imaging of the lumbar spine was
performed. No intravenous contrast was administered.

[Series 4: T1 · sagittal · 4.0mm · 0.55mm/px · 5 of 13 slices shown (1 of 2)]
[im 1/13]
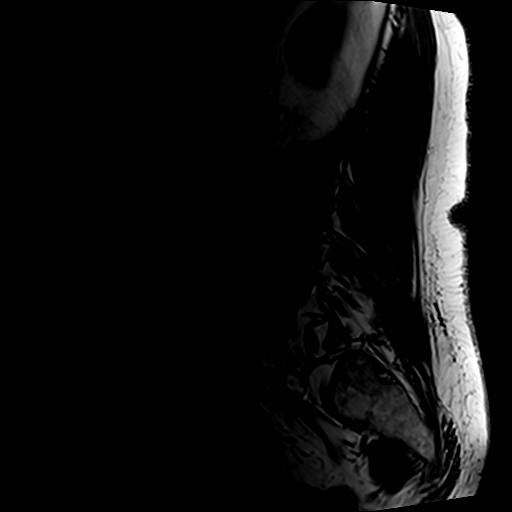
[im 4/13]
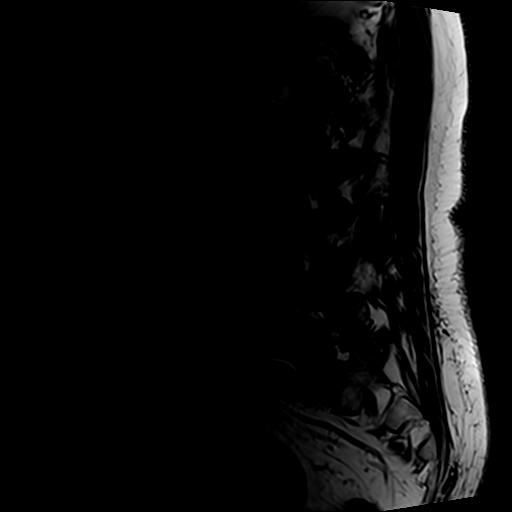
[im 7/13]
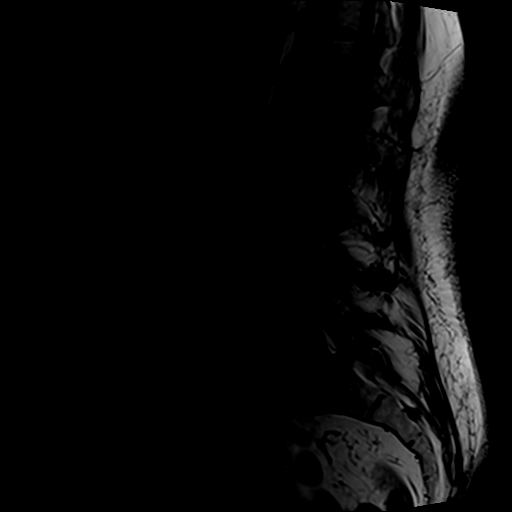
[im 10/13]
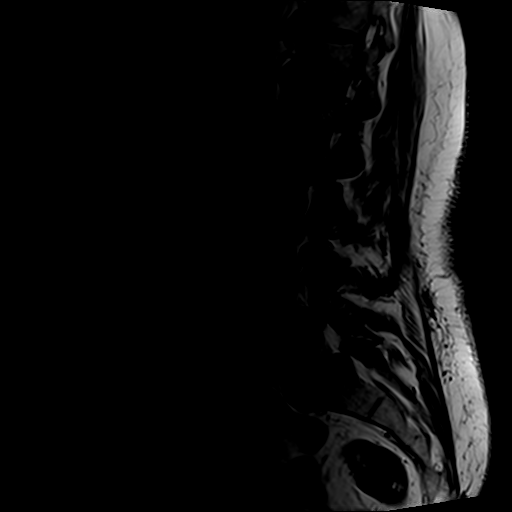
[im 13/13]
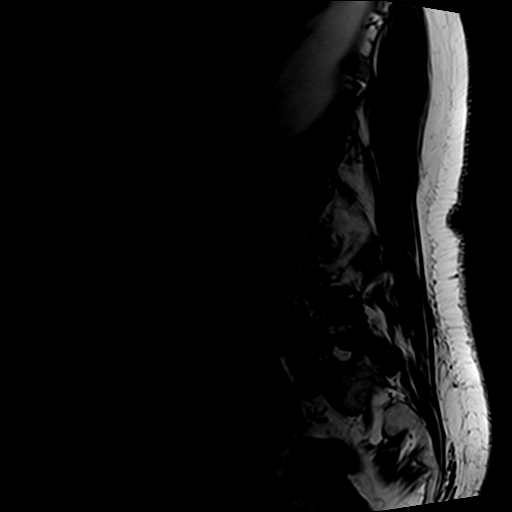

[Series 5: T2 post-contrast · sagittal · 4.0mm · 0.55mm/px · 5 of 13 slices shown]
[im 1/13]
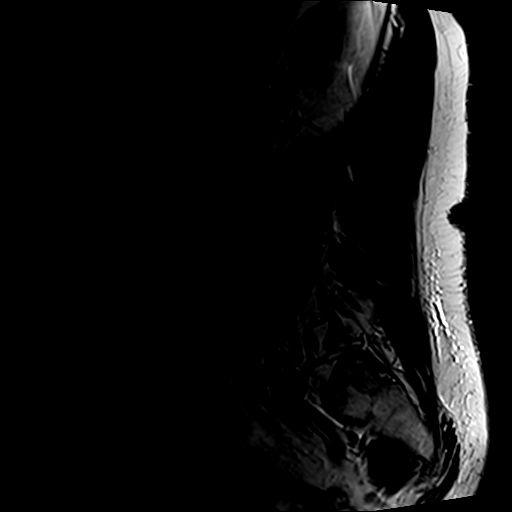
[im 4/13]
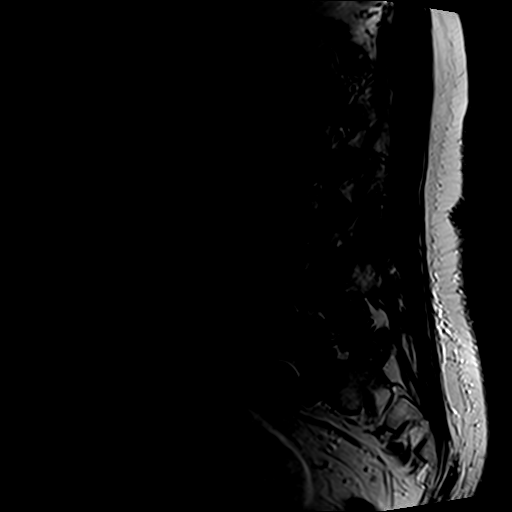
[im 7/13]
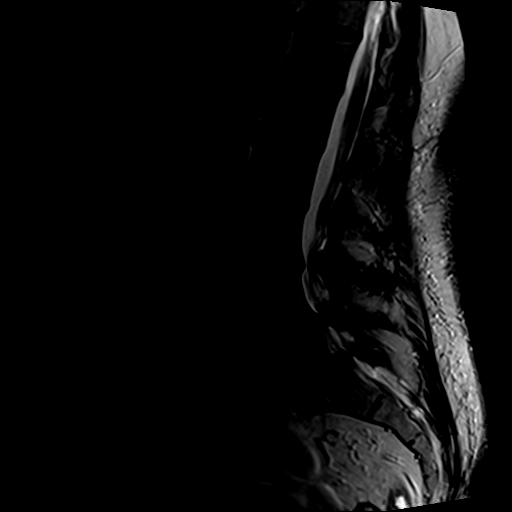
[im 10/13]
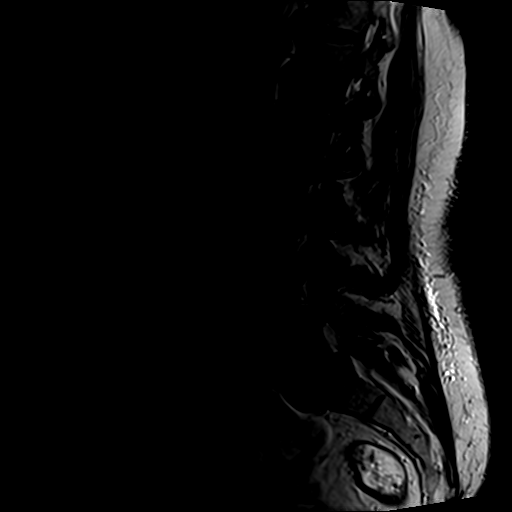
[im 13/13]
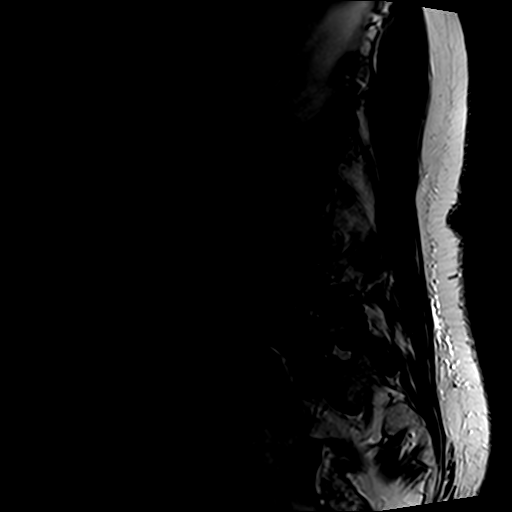

[Series 6: T2 · axial · 4.0mm · 0.70mm/px · z∈[+23,+224]mm · 10 of 39 slices shown]
[im 3/39]
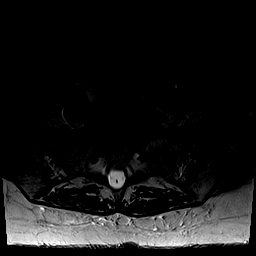
[im 6/39]
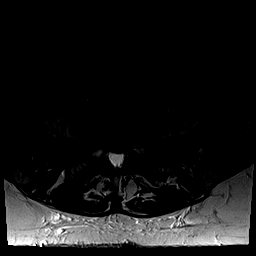
[im 8/39]
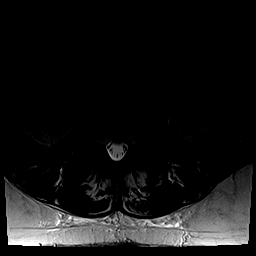
[im 13/39]
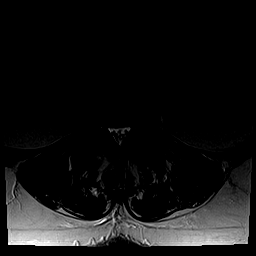
[im 18/39]
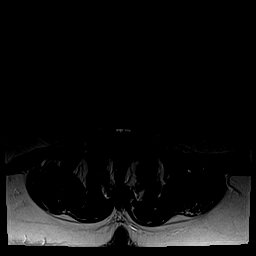
[im 21/39]
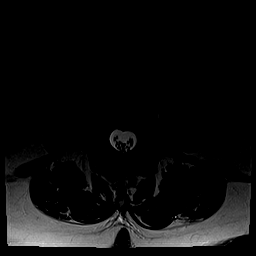
[im 23/39]
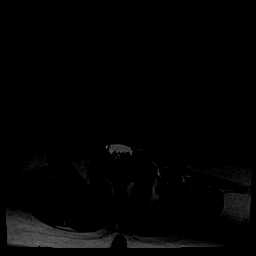
[im 28/39]
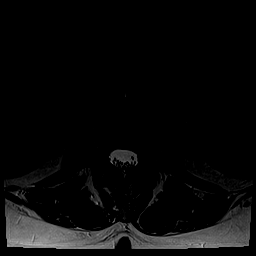
[im 33/39]
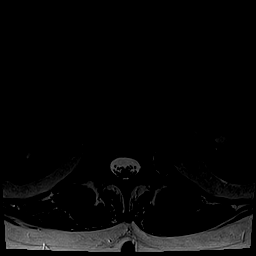
[im 39/39]
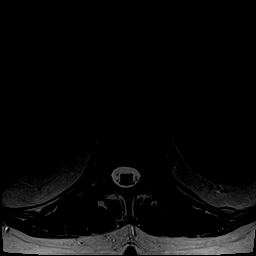

[Series 7: T1 · axial · 4.0mm · 0.35mm/px · z∈[+23,+195]mm · 5 of 39 slices shown (2 of 2)]
[im 3/39]
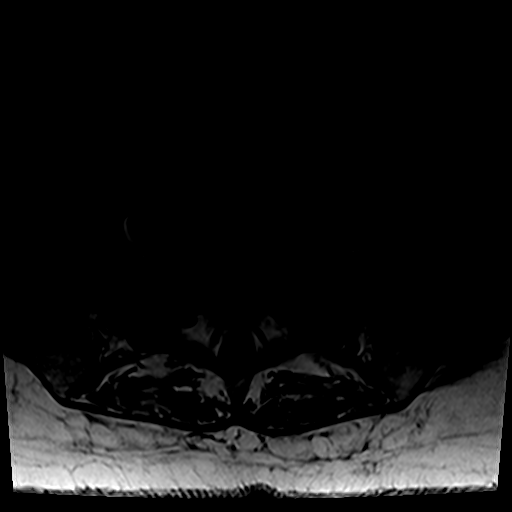
[im 6/39]
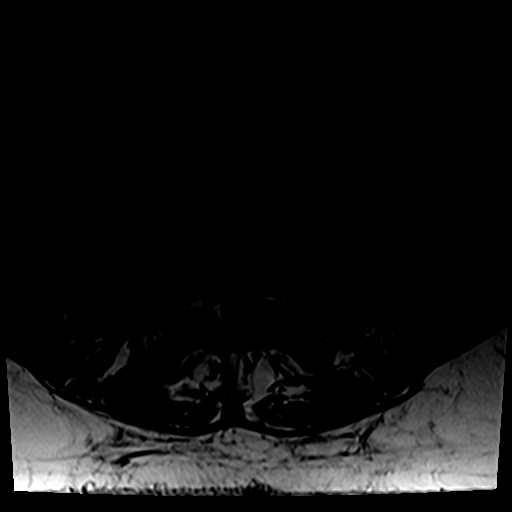
[im 8/39]
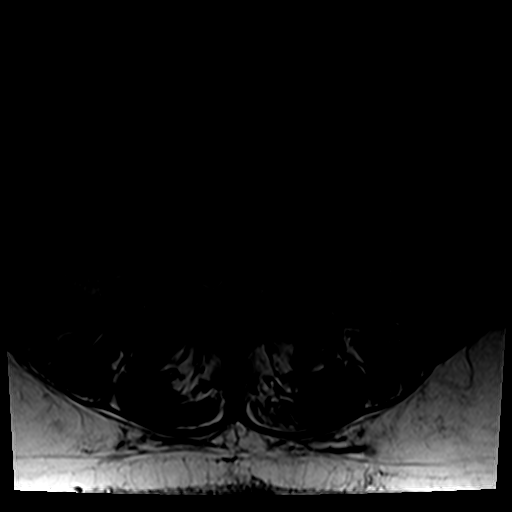
[im 21/39]
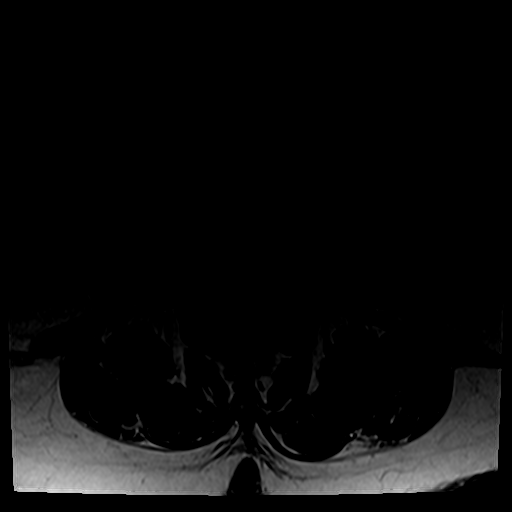
[im 33/39]
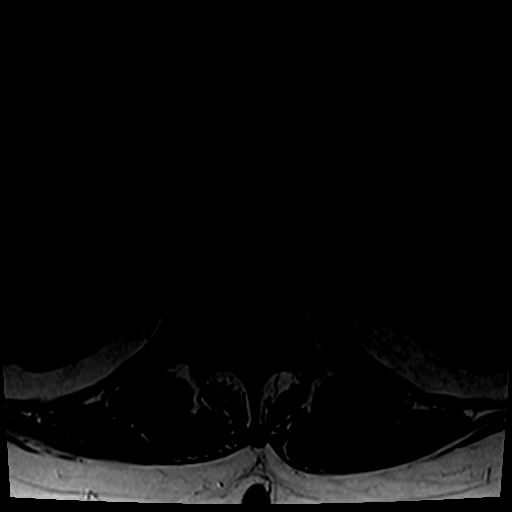

[25 of 48 positions shown; findings below may reference images not displayed]

FINDINGS: Segmentation:  Standard.

Alignment: 0.6 cm anterolisthesis L4 on L5 due to facet arthropathy
is new since the prior examination. Trace anterolisthesis L5 on S1
is unchanged.

Vertebrae:  No fracture or worrisome lesion.

Conus medullaris: Extends to the L1-2 level and appears normal.

Paraspinal and other soft tissues: Negative.

Disc levels:

T11-12 is imaged in the sagittal plane only. There is a minimal
central protrusion without stenosis.

T12-L1:  Minimal right paracentral protrusion without stenosis.

L1-2:  Negative.

L2-3: Mild facet degenerative disease and a minimal disc bulge
without central canal or foraminal stenosis.

L3-4: Moderate facet arthropathy, ligamentum flavum thickening and a
shallow disc bulge. There is moderate central canal stenosis with
narrowing in the subarticular recesses which could impact either
descending L4 root. The foramina are open. Spondylosis has worsened
since the prior MRI.

L4-5: Advanced facet degenerative disease and bulky ligamentum
flavum thickening are seen. The disc is uncovered with a shallow
bulge. There is severe central canal and bilateral subarticular
recess narrowing. Mild left foraminal narrowing is seen. The right
foramen is open. Spondylosis is much worse than on the prior MRI.

L5-S1: Advanced facet degenerative disease is worse on the left.
There is a shallow broad-based central protrusion and ligamentum
flavum thickening. Narrowing is seen in the subarticular recesses.
Spondylosis has worsened since the prior MRI.
IMPRESSION: Worsened spondylosis at L4-5 where there is severe central canal and
bilateral subarticular recess narrowing due to bulky ligamentum
flavum thickening and a shallow disc bulge. Advanced facet
arthropathy at this level results in 0.6 cm anterolisthesis.

Worsened spondylosis at L3-4 where there is moderate central canal
stenosis and narrowing in the subarticular recesses which could
impact either descending L4 root.

Some progression spondylosis at L5-S1 where there is a shallow
broad-based central protrusion and facet arthropathy resulting in
narrowing in the subarticular recesses which could irritate either
descending S1 root.

## 2019-07-06 ENCOUNTER — Other Ambulatory Visit: Payer: Self-pay

## 2019-07-06 ENCOUNTER — Ambulatory Visit: Payer: Medicare Other | Admitting: Physical Medicine and Rehabilitation

## 2019-07-06 ENCOUNTER — Ambulatory Visit: Payer: Self-pay

## 2019-07-06 ENCOUNTER — Encounter: Payer: Self-pay | Admitting: Physical Medicine and Rehabilitation

## 2019-07-06 VITALS — BP 145/79 | HR 72

## 2019-07-06 DIAGNOSIS — M5416 Radiculopathy, lumbar region: Secondary | ICD-10-CM

## 2019-07-06 DIAGNOSIS — M48062 Spinal stenosis, lumbar region with neurogenic claudication: Secondary | ICD-10-CM | POA: Diagnosis not present

## 2019-07-06 MED ORDER — METHYLPREDNISOLONE ACETATE 80 MG/ML IJ SUSP
40.0000 mg | Freq: Once | INTRAMUSCULAR | Status: AC
  Administered 2019-07-06: 40 mg

## 2019-07-06 NOTE — Progress Notes (Signed)
Pt states pain in the lower back and radiates into both legs all the way down. Pt states pain started a month ago post esi 12/08/18. Pt states laying down and walking makes pain worse. Resting helps with pain.   Numeric Pain Rating Scale and Functional Assessment Average Pain 7   In the last MONTH (on 0-10 scale) has pain interfered with the following?  1. General activity like being  able to carry out your everyday physical activities such as walking, climbing stairs, carrying groceries, or moving a chair?  Rating(8)   +Driver, -BT, -Dye Allergies.

## 2019-07-08 NOTE — Progress Notes (Signed)
Megan [name] - 85 y.o. female MRN [id number]  Date of birth: 1934/08/14  Office Visit Note: Visit Date: 07/06/2019 PCP: Merrilee Seashore, MD Referred by: Merrilee Seashore, MD  Subjective: Chief Complaint  Patient presents with  . Lower Back - Pain  . Right Leg - Pain  . Left Leg - Pain   HPI:  Megan [name] is a 85 y.o. female who comes in today For planned repeat right L5 and left L4 transforaminal epidural steroid injection.  We completed the same injection in November with fairly good relief up until just about a month or so ago.  She is reporting worsening symptoms of low back pain and radicular leg pain without trauma falls etc.  Her history is such that in 2018 MRI revealed worsening severe multifactorial stenosis at L4-5 and moderate at L3-4.  She has a complicated medical history of aortic valve replacement in 2020.  She is not on any anticoagulants however.  She does have a history of diabetes.  She has had no new falls or trauma.  No focal weakness.  We will repeat the injection today she did get a lot of relief for many months.  ROS Otherwise per HPI.  Assessment & Plan: Visit Diagnoses:  1. Lumbar radiculopathy   2. Spinal stenosis of lumbar region with neurogenic claudication     Plan: No additional findings.   Meds & Orders:  Meds ordered this encounter  Medications  . methylPREDNISolone acetate (DEPO-MEDROL) injection 40 mg    Orders Placed This Encounter  Procedures  . XR C-ARM NO REPORT  . Epidural Steroid injection    Follow-up: Return if symptoms worsen or fail to improve.   Procedures: No procedures performed  Lumbosacral Transforaminal Epidural Steroid Injection - Sub-Pedicular Approach with Fluoroscopic Guidance  [name]      Date of Birth: [birth year] MRN: [id number] PCP: Merrilee Seashore, MD      Visit Date: 07/06/2019   Universal Protocol:    Date/Time: 07/06/2019  Consent Given By: the patient  Position:  PRONE  Additional Comments: Vital signs were monitored before and after the procedure. Patient was prepped and draped in the usual sterile fashion. The correct patient, procedure, and site was verified.   Injection Procedure Details:  Procedure Site One Meds Administered:  Meds ordered this encounter  Medications  . methylPREDNISolone acetate (DEPO-MEDROL) injection 40 mg    Laterality: Right, Left  Location/Site:  L5-S1, L4-5  Needle size: 22 G  Needle type: Spinal  Needle Placement: Transforaminal  Findings:    -Comments: Excellent flow of contrast along the nerve and into the epidural space.  Procedure Details: After squaring off the end-plates to get a true AP view, the C-arm was positioned so that an oblique view of the foramen as noted above was visualized. The target area is just inferior to the "nose of the scotty dog" or sub pedicular. The soft tissues overlying this structure were infiltrated with 2-3 ml. of 1% Lidocaine without Epinephrine.  The spinal needle was inserted toward the target using a "trajectory" view along the fluoroscope beam.  Under AP and lateral visualization, the needle was advanced so it did not puncture dura and was located close the 6 O'Clock position of the pedical in AP tracterory. Biplanar projections were used to confirm position. Aspiration was confirmed to be negative for CSF and/or blood. A 1-2 ml. volume of Isovue-250 was injected and flow of contrast was noted at each level. Radiographs were obtained for  documentation purposes.   After attaining the desired flow of contrast documented above, a 0.5 to 1.0 ml test dose of 0.25% Marcaine was injected into each respective transforaminal space.  The patient was observed for 90 seconds post injection.  After no sensory deficits were reported, and normal lower extremity motor function was noted,   the above injectate was administered so that equal amounts of the injectate were placed at each  foramen (level) into the transforaminal epidural space.   Additional Comments:  The patient tolerated the procedure well Dressing: 2 x 2 sterile gauze and Band-Aid    Post-procedure details: Patient was observed during the procedure. Post-procedure instructions were reviewed.  Patient left the clinic in stable condition.      Clinical History: MRI LUMBAR SPINE WITHOUT CONTRAST  TECHNIQUE: Multiplanar, multisequence MR imaging of the lumbar spine was performed. No intravenous contrast was administered.  COMPARISON:  MRI lumbar spine 08/14/2004.  FINDINGS: Segmentation:  Standard.  Alignment: 0.6 cm anterolisthesis L4 on L5 due to facet arthropathy is new since the prior examination. Trace anterolisthesis L5 on S1 is unchanged.  Vertebrae:  No fracture or worrisome lesion.  Conus medullaris: Extends to the L1-2 level and appears normal.  Paraspinal and other soft tissues: Negative.  Disc levels:  T11-12 is imaged in the sagittal plane only. There is a minimal central protrusion without stenosis.  T12-L1:  Minimal right paracentral protrusion without stenosis.  L1-2:  Negative.  L2-3: Mild facet degenerative disease and a minimal disc bulge without central canal or foraminal stenosis.  L3-4: Moderate facet arthropathy, ligamentum flavum thickening and a shallow disc bulge. There is moderate central canal stenosis with narrowing in the subarticular recesses which could impact either descending L4 root. The foramina are open. Spondylosis has worsened since the prior MRI.  L4-5: Advanced facet degenerative disease and bulky ligamentum flavum thickening are seen. The disc is uncovered with a shallow bulge. There is severe central canal and bilateral subarticular recess narrowing. Mild left foraminal narrowing is seen. The right foramen is open. Spondylosis is much worse than on the prior MRI.  L5-S1: Advanced facet degenerative disease is worse  on the left. There is a shallow broad-based central protrusion and ligamentum flavum thickening. Narrowing is seen in the subarticular recesses. Spondylosis has worsened since the prior MRI.  IMPRESSION: Worsened spondylosis at L4-5 where there is severe central canal and bilateral subarticular recess narrowing due to bulky ligamentum flavum thickening and a shallow disc bulge. Advanced facet arthropathy at this level results in 0.6 cm anterolisthesis.  Worsened spondylosis at L3-4 where there is moderate central canal stenosis and narrowing in the subarticular recesses which could impact either descending L4 root.  Some progression spondylosis at L5-S1 where there is a shallow broad-based central protrusion and facet arthropathy resulting in narrowing in the subarticular recesses which could irritate either descending S1 root.   Electronically Signed   By: Inge Rise M.D.   On: 11/26/2016 12:04     Objective:  VS:  HT:    WT:   BMI:     BP:(!) 145/79  HR:72bpm  TEMP: ( )  RESP:  Physical Exam Constitutional:      General: She is not in acute distress.    Appearance: Normal appearance. She is not ill-appearing.  HENT:     Head: Normocephalic and atraumatic.     Right Ear: External ear normal.     Left Ear: External ear normal.  Eyes:     Extraocular Movements: Extraocular  movements intact.  Cardiovascular:     Rate and Rhythm: Normal rate.     Pulses: Normal pulses.  Musculoskeletal:     Right lower leg: No edema.     Left lower leg: No edema.     Comments: Patient has good distal strength with no pain over the greater trochanters.  No clonus or focal weakness.  Skin:    Findings: No erythema, lesion or rash.  Neurological:     General: No focal deficit present.     Mental Status: She is alert and oriented to person, place, and time.     Sensory: No sensory deficit.     Motor: No weakness or abnormal muscle tone.     Coordination: Coordination  normal.  Psychiatric:        Mood and Affect: Mood normal.        Behavior: Behavior normal.      Imaging: No results found.

## 2019-07-08 NOTE — Procedures (Signed)
Lumbosacral Transforaminal Epidural Steroid Injection - Sub-Pedicular Approach with Fluoroscopic Guidance  [name]      Date of Birth: 06-19-1934 MRN: [id number] PCP: Merrilee Seashore, MD      Visit Date: 07/06/2019   Universal Protocol:    Date/Time: 07/06/2019  Consent Given By: the patient  Position: PRONE  Additional Comments: Vital signs were monitored before and after the procedure. Patient was prepped and draped in the usual sterile fashion. The correct patient, procedure, and site was verified.   Injection Procedure Details:  Procedure Site One Meds Administered:  Meds ordered this encounter  Medications  . methylPREDNISolone acetate (DEPO-MEDROL) injection 40 mg    Laterality: Right, Left  Location/Site:  L5-S1, L4-5  Needle size: 22 G  Needle type: Spinal  Needle Placement: Transforaminal  Findings:    -Comments: Excellent flow of contrast along the nerve and into the epidural space.  Procedure Details: After squaring off the end-plates to get a true AP view, the C-arm was positioned so that an oblique view of the foramen as noted above was visualized. The target area is just inferior to the "nose of the scotty dog" or sub pedicular. The soft tissues overlying this structure were infiltrated with 2-3 ml. of 1% Lidocaine without Epinephrine.  The spinal needle was inserted toward the target using a "trajectory" view along the fluoroscope beam.  Under AP and lateral visualization, the needle was advanced so it did not puncture dura and was located close the 6 O'Clock position of the pedical in AP tracterory. Biplanar projections were used to confirm position. Aspiration was confirmed to be negative for CSF and/or blood. A 1-2 ml. volume of Isovue-250 was injected and flow of contrast was noted at each level. Radiographs were obtained for documentation purposes.   After attaining the desired flow of contrast documented above, a 0.5 to 1.0 ml test  dose of 0.25% Marcaine was injected into each respective transforaminal space.  The patient was observed for 90 seconds post injection.  After no sensory deficits were reported, and normal lower extremity motor function was noted,   the above injectate was administered so that equal amounts of the injectate were placed at each foramen (level) into the transforaminal epidural space.   Additional Comments:  The patient tolerated the procedure well Dressing: 2 x 2 sterile gauze and Band-Aid    Post-procedure details: Patient was observed during the procedure. Post-procedure instructions were reviewed.  Patient left the clinic in stable condition.

## 2019-07-20 ENCOUNTER — Ambulatory Visit
Admission: RE | Admit: 2019-07-20 | Discharge: 2019-07-20 | Disposition: A | Discharge status: Home / Self Care | Payer: Medicare Other | Source: Ambulatory Visit | Attending: Internal Medicine | Admitting: Internal Medicine

## 2019-07-20 ENCOUNTER — Other Ambulatory Visit: Payer: Self-pay

## 2019-07-20 DIAGNOSIS — Z1231 Encounter for screening mammogram for malignant neoplasm of breast: Secondary | ICD-10-CM | POA: Diagnosis not present

## 2019-08-05 DIAGNOSIS — E118 Type 2 diabetes mellitus with unspecified complications: Secondary | ICD-10-CM | POA: Diagnosis not present

## 2019-08-05 DIAGNOSIS — E039 Hypothyroidism, unspecified: Secondary | ICD-10-CM | POA: Diagnosis not present

## 2019-08-05 DIAGNOSIS — E782 Mixed hyperlipidemia: Secondary | ICD-10-CM | POA: Diagnosis not present

## 2019-08-05 DIAGNOSIS — I10 Essential (primary) hypertension: Secondary | ICD-10-CM | POA: Diagnosis not present

## 2019-08-09 DIAGNOSIS — H35033 Hypertensive retinopathy, bilateral: Secondary | ICD-10-CM | POA: Diagnosis not present

## 2019-08-09 DIAGNOSIS — H26493 Other secondary cataract, bilateral: Secondary | ICD-10-CM | POA: Diagnosis not present

## 2019-08-09 DIAGNOSIS — E119 Type 2 diabetes mellitus without complications: Secondary | ICD-10-CM | POA: Diagnosis not present

## 2019-08-09 DIAGNOSIS — H26491 Other secondary cataract, right eye: Secondary | ICD-10-CM | POA: Diagnosis not present

## 2019-08-09 DIAGNOSIS — H35373 Puckering of macula, bilateral: Secondary | ICD-10-CM | POA: Diagnosis not present

## 2019-08-16 DIAGNOSIS — E782 Mixed hyperlipidemia: Secondary | ICD-10-CM | POA: Diagnosis not present

## 2019-08-16 DIAGNOSIS — E1165 Type 2 diabetes mellitus with hyperglycemia: Secondary | ICD-10-CM | POA: Diagnosis not present

## 2019-08-16 DIAGNOSIS — E118 Type 2 diabetes mellitus with unspecified complications: Secondary | ICD-10-CM | POA: Diagnosis not present

## 2019-08-16 DIAGNOSIS — H35039 Hypertensive retinopathy, unspecified eye: Secondary | ICD-10-CM | POA: Diagnosis not present

## 2019-08-16 DIAGNOSIS — E039 Hypothyroidism, unspecified: Secondary | ICD-10-CM | POA: Diagnosis not present

## 2019-08-16 DIAGNOSIS — E119 Type 2 diabetes mellitus without complications: Secondary | ICD-10-CM | POA: Diagnosis not present

## 2019-08-16 DIAGNOSIS — I10 Essential (primary) hypertension: Secondary | ICD-10-CM | POA: Diagnosis not present

## 2019-08-17 DIAGNOSIS — D225 Melanocytic nevi of trunk: Secondary | ICD-10-CM | POA: Diagnosis not present

## 2019-08-17 DIAGNOSIS — L570 Actinic keratosis: Secondary | ICD-10-CM | POA: Diagnosis not present

## 2019-08-17 DIAGNOSIS — I8393 Asymptomatic varicose veins of bilateral lower extremities: Secondary | ICD-10-CM | POA: Diagnosis not present

## 2019-08-17 DIAGNOSIS — L821 Other seborrheic keratosis: Secondary | ICD-10-CM | POA: Diagnosis not present

## 2019-08-17 DIAGNOSIS — L905 Scar conditions and fibrosis of skin: Secondary | ICD-10-CM | POA: Diagnosis not present

## 2019-09-13 DIAGNOSIS — H26492 Other secondary cataract, left eye: Secondary | ICD-10-CM | POA: Diagnosis not present

## 2019-09-21 DIAGNOSIS — E119 Type 2 diabetes mellitus without complications: Secondary | ICD-10-CM | POA: Diagnosis not present

## 2019-09-21 DIAGNOSIS — H04123 Dry eye syndrome of bilateral lacrimal glands: Secondary | ICD-10-CM | POA: Diagnosis not present

## 2019-09-21 DIAGNOSIS — H47011 Ischemic optic neuropathy, right eye: Secondary | ICD-10-CM | POA: Diagnosis not present

## 2019-09-21 DIAGNOSIS — H16223 Keratoconjunctivitis sicca, not specified as Sjogren's, bilateral: Secondary | ICD-10-CM | POA: Diagnosis not present

## 2019-09-28 NOTE — Progress Notes (Signed)
Date:  09/29/2019   ID:  Megan [name], DOB [birth year], MRN [id number]  Provider Location: Office  PCP:  Merrilee Seashore, MD  Cardiologist:   Johnsie Cancel Electrophysiologist:  None   Evaluation Performed:  Follow-Up Visit  Chief Complaint:  AS/TAVR  History of Present Illness:    85 y.o. with severe AS Successful TAVR with 23 mm Sapien 3 TF approach 03/03/18. Post op echo with mean gradient elevated 19 mmHg No PVL Has seen Dr Lamonte Sakai for pulmonary HTN He hopes this improves with Rx of her left sided valve disease Cath done 01/29/18 prior to TAVR no CAD. Measured PA pressure on right heart cath only 33/12 with mean 22 mmHg. Mean PCWP 10 mmHg  No complaints Husband passed 6 years ago had been married 51 years Son lives with her and helps out Activity limited by arthritis in knees and back   Post TAVR echo 04/09/18 mean gradient 16 mmHg trivial PVL EF >65% 03/10/19 Echo with vigorous EF 70-75% gradient increased 23 mmHg peak 44 mmHg DVI fine at 0.45 AVA 1.4 cm2 Also noted moderate MS with mean gradient 9 mmHg mild MR MAC  Had two vertiginous episodes turning head quickly got dizzy for seconds  ECG with chronic RBBB  Monitor 04/09/19 with average HR 77 bpm <1% PAC/PVC''s   Seems to be doing better Has had vaccine   The patient  does not have symptoms concerning for COVID-19 infection (fever, chills, cough, or new shortness of breath).    Past Medical History:  Diagnosis Date   Anemia    Arthritis    Cancer ([place]) 1989   melanoma   Diabetes mellitus without complication (HCC)    Dizziness    GERD (gastroesophageal reflux disease)    History of hiatal hernia    Hypertension    Hypothyroidism    IBS (irritable bowel syndrome)    S/P TAVR (transcatheter aortic valve replacement) 03/03/2018   s/p 23 mm Edwards Sapien THV via the TF approach   Severe aortic stenosis    Past Surgical History:  Procedure Laterality Date   APPENDECTOMY     85 yrs old   CHOLECYSTECTOMY   89   COLONOSCOPY     EYE SURGERY Bilateral 2017   cataract surgery with lens implants   JOINT REPLACEMENT Right    knee    REVERSE SHOULDER ARTHROPLASTY Left 07/14/2014   REVERSE SHOULDER ARTHROPLASTY Left 07/14/2014   Procedure: LEFT REVERSE SHOULDER ARTHROPLASTY;  Surgeon: Meredith Pel, MD;  Location: [facility];  Service: Orthopedics;  Laterality: Left;   RIGHT/LEFT HEART CATH AND CORONARY ANGIOGRAPHY N/A 01/29/2018   Procedure: RIGHT/LEFT HEART CATH AND CORONARY ANGIOGRAPHY;  Surgeon: Belva Crome, MD;  Location: [place] [facility];  Service: Cardiovascular;  Laterality: N/A;   SHOULDER ARTHROSCOPY Left    TEE WITHOUT CARDIOVERSION N/A 03/03/2018   Procedure: TRANSESOPHAGEAL ECHOCARDIOGRAM (TEE);  Surgeon: Sherren Mocha, MD;  Location: [facility];  Service: Open Heart Surgery;  Laterality: N/A;   TOTAL SHOULDER REVISION Right 10/29/2016   Procedure: RIGHT REVERSE TOTAL SHOULDER;  Surgeon: Meredith Pel, MD;  Location: [facility];  Service: Orthopedics;  Laterality: Right;   TRANSCATHETER AORTIC VALVE REPLACEMENT, TRANSFEMORAL N/A 03/03/2018   Procedure: TRANSCATHETER AORTIC VALVE REPLACEMENT, TRANSFEMORAL;  Surgeon: Sherren Mocha, MD;  Location: [place] [facility];  Service: Open Heart Surgery;  Laterality: N/A;     Current Meds  Medication Sig   acetaminophen (TYLENOL) 500 MG tablet Take 500-1,000 mg  by mouth every 6 (six) hours as needed (pain.).   amLODipine-valsartan (EXFORGE) 5-160 MG tablet Take 1 tablet by mouth daily.   aspirin 81 MG chewable tablet Chew 1 tablet (81 mg total) by mouth daily.   calcium-vitamin D (OSCAL WITH D) 500-200 MG-UNIT tablet Take 1 tablet by mouth daily at 12 noon.    econazole nitrate 1 % cream Apply 1 application topically daily. For nose/corners of mouth   Ergocalciferol (VITAMIN D2 PO) Take 2,000 mg by mouth.   gabapentin (NEURONTIN) 100 MG capsule TAKE 1 CAPSULE (100 MG TOTAL) BY MOUTH AT BEDTIME.   glimepiride (AMARYL)  2 MG tablet Take 2 mg by mouth See admin instructions. Take 2 mg by mouth in the morning and 4 mg by mouth at night.   hydrocortisone 2.5 % cream Apply 1 application topically daily. Applied to nose/corners of mouth   Multiple Vitamin (MULTIVITAMIN WITH MINERALS) TABS tablet Take 1 tablet by mouth daily at 12 noon.    MYRBETRIQ 50 MG TB24 tablet Take 50 mg by mouth daily.   Omega-3 Fatty Acids (FISH OIL) 1200 MG CAPS Take 1,200 mg by mouth daily at 12 noon.    rosuvastatin (CRESTOR) 5 MG tablet Take 5 mg by mouth daily.   sitaGLIPtin (JANUVIA) 100 MG tablet Take 100 mg by mouth daily.   SYNTHROID 125 MCG tablet Take 125 mcg by mouth daily before breakfast. Take 5 days a week   triamterene-hydrochlorothiazide (DYAZIDE) 37.5-25 MG per capsule Take 1 capsule by mouth at bedtime.      Allergies:   Patient has no known allergies.   Social History   Tobacco Use   Smoking status: Never Smoker   Smokeless tobacco: Never Used  Vaping Use   Vaping Use: Never used  Substance Use Topics   Alcohol use: No   Drug use: No     Family Hx: The patient's family history includes Heart disease in her brother and father; Hypertension in her father and mother.  ROS:   Please see the history of present illness.     All other systems reviewed and are negative.   Prior CV studies:   The following studies were reviewed today:  Echo 04/09/18 EF >65% trivial PVL mean gradient 16 mmHg peak 33 mmHg Echo 03/10/19 see HPI Monitor  04/21/19   Labs/Other Tests and Data Reviewed:    EKG:  SR rate 84 RBBB  09/29/19   Recent Labs: No results found for requested labs within last 8760 hours.   Recent Lipid Panel No results found for: CHOL, TRIG, HDL, CHOLHDL, LDLCALC, LDLDIRECT  Wt Readings from Last 3 Encounters:  09/29/19 199 lb (90.3 kg)  04/05/19 195 lb 8 oz (88.7 kg)  03/10/19 196 lb 8 oz (89.1 kg)     Objective:    Vital Signs:  BP 122/64    Pulse 84    Ht _0  (1.626 m)    Wt 199  lb (90.3 kg)    LMP  (LMP Unknown)    BMI 34.16 kg/m    Affect appropriate Healthy:  appears stated age 67: normal Neck supple with no adenopathy JVP normal no bruits no thyromegaly Lungs clear with no wheezing and good diaphragmatic motion Heart:  S1/S2 SEM no AR  murmur, no rub, gallop or click PMI normal Abdomen: benighn, BS positve, no tenderness, no AAA no bruit.  No HSM or HJR Distal pulses intact with no bruits No edema Neuro non-focal Skin warm and dry No muscular weakness  ASSESSMENT & PLAN:    AS/TAVR:  03/01/18 23 mm Edwards Sapien 3 DAT SBE prophylaxis. Gradients elevated from insertion date DVI ok f/u echo ordered   HTN:  Well controlled.  Continue current medications and low sodium Dash type diet.  Currently on Dyazide and exforge   Thyroid:  On replacement TSH normal   DM:  Discussed low carb diet.  Target hemoglobin A1c is 6.5 or less.  Continue current medications.  HLD:  On crestor f/u labs with primary     COVID-19 Education: The signs and symptoms of COVID-19 were discussed with the patient and how to seek care for testing (follow up with PCP or arrange E-visit).  The importance of social distancing was discussed today.   Medication Adjustments/Labs and Tests Ordered: Current medicines are reviewed at length with the patient today.  Concerns regarding medicines are outlined above.   Tests Ordered:  Echo post TAVR   Medication Changes:  None   Disposition:  Follow up with cardiology in 6 months   Signed, Jenkins Rouge, MD  09/29/2019 10:39 AM    [place]

## 2019-09-29 ENCOUNTER — Other Ambulatory Visit: Payer: Self-pay

## 2019-09-29 ENCOUNTER — Ambulatory Visit: Payer: Medicare Other | Admitting: Cardiovascular Disease

## 2019-09-29 ENCOUNTER — Encounter: Payer: Self-pay | Admitting: Cardiovascular Disease

## 2019-09-29 VITALS — BP 122/64 | HR 84 | Ht 64.0 in | Wt 199.0 lb

## 2019-09-29 DIAGNOSIS — Z952 Presence of prosthetic heart valve: Secondary | ICD-10-CM | POA: Diagnosis not present

## 2019-09-29 NOTE — Patient Instructions (Signed)
Medication Instructions:  *If you need a refill on your cardiac medications before your next appointment, please call your pharmacy*  Lab Work: If you have labs (blood work) drawn today and your tests are completely normal, you will receive your results only by: . MyChart Message (if you have MyChart) OR . A paper copy in the mail If you have any lab test that is abnormal or we need to change your treatment, we will call you to review the results.  Follow-Up: At [facility], you and your health needs are our priority.  As part of our continuing mission to provide you with exceptional heart care, we have created designated Provider Care Teams.  These Care Teams include your primary Cardiologist (physician) and Advanced Practice Providers (APPs -  Physician Assistants and Nurse Practitioners) who all work together to provide you with the care you need, when you need it.  We recommend signing up for the patient portal called "MyChart".  Sign up information is provided on this After Visit Summary.  MyChart is used to connect with patients for Virtual Visits (Telemedicine).  Patients are able to view lab/test results, encounter notes, upcoming appointments, etc.  Non-urgent messages can be sent to your provider as well.   To learn more about what you can do with MyChart, go to [email or web address].    Your next appointment:   6 month(s)  The format for your next appointment:   In Person  Provider:   You may see Peter Nishan, MD or one of the following Advanced Practice Providers on your designated Care Team:    Lori Gerhardt, NP  Laura Ingold, NP  Jill McDaniel, NP    

## 2019-09-30 ENCOUNTER — Ambulatory Visit: Payer: Medicare Other | Admitting: Cardiovascular Disease

## 2019-09-30 ENCOUNTER — Ambulatory Visit (HOSPITAL_COMMUNITY): Payer: Medicare Other | Attending: Cardiovascular Disease

## 2019-09-30 DIAGNOSIS — I059 Rheumatic mitral valve disease, unspecified: Secondary | ICD-10-CM | POA: Diagnosis not present

## 2019-09-30 LAB — ECHOCARDIOGRAM COMPLETE
AR max vel: 1.3 cm2
AV Area VTI: 1.36 cm2
AV Area mean vel: 1.29 cm2
AV Mean grad: 16 mmHg
AV Peak grad: 25.2 mmHg
Ao pk vel: 2.51 m/s
Area-P 1/2: 2.05 cm2
S' Lateral: 2.1 cm

## 2019-11-16 DIAGNOSIS — E039 Hypothyroidism, unspecified: Secondary | ICD-10-CM | POA: Diagnosis not present

## 2019-11-16 DIAGNOSIS — E782 Mixed hyperlipidemia: Secondary | ICD-10-CM | POA: Diagnosis not present

## 2019-11-16 DIAGNOSIS — I10 Essential (primary) hypertension: Secondary | ICD-10-CM | POA: Diagnosis not present

## 2019-11-16 DIAGNOSIS — E118 Type 2 diabetes mellitus with unspecified complications: Secondary | ICD-10-CM | POA: Diagnosis not present

## 2019-11-16 DIAGNOSIS — E119 Type 2 diabetes mellitus without complications: Secondary | ICD-10-CM | POA: Diagnosis not present

## 2019-12-02 DIAGNOSIS — I2510 Atherosclerotic heart disease of native coronary artery without angina pectoris: Secondary | ICD-10-CM | POA: Diagnosis not present

## 2019-12-02 DIAGNOSIS — E782 Mixed hyperlipidemia: Secondary | ICD-10-CM | POA: Diagnosis not present

## 2019-12-02 DIAGNOSIS — I10 Essential (primary) hypertension: Secondary | ICD-10-CM | POA: Diagnosis not present

## 2019-12-09 DIAGNOSIS — E039 Hypothyroidism, unspecified: Secondary | ICD-10-CM | POA: Diagnosis not present

## 2019-12-09 DIAGNOSIS — E782 Mixed hyperlipidemia: Secondary | ICD-10-CM | POA: Diagnosis not present

## 2019-12-09 DIAGNOSIS — H35039 Hypertensive retinopathy, unspecified eye: Secondary | ICD-10-CM | POA: Diagnosis not present

## 2019-12-09 DIAGNOSIS — I10 Essential (primary) hypertension: Secondary | ICD-10-CM | POA: Diagnosis not present

## 2019-12-09 DIAGNOSIS — E1165 Type 2 diabetes mellitus with hyperglycemia: Secondary | ICD-10-CM | POA: Diagnosis not present

## 2020-01-12 ENCOUNTER — Telehealth: Payer: Self-pay

## 2020-01-12 NOTE — Telephone Encounter (Signed)
Sounds good

## 2020-01-12 NOTE — Telephone Encounter (Signed)
Called pt and sch 02/07/20.

## 2020-01-12 NOTE — Telephone Encounter (Signed)
Pt called wanting a repeat of Right/ Left L5-S1, L4-5 TF. Pt mention that the inj helped more than 50%, same side and no new injury.

## 2020-02-07 ENCOUNTER — Other Ambulatory Visit: Payer: Self-pay

## 2020-02-07 ENCOUNTER — Ambulatory Visit: Payer: Medicare Other | Admitting: Physical Medicine and Rehabilitation

## 2020-02-07 ENCOUNTER — Ambulatory Visit: Payer: Self-pay

## 2020-02-07 ENCOUNTER — Encounter: Payer: Self-pay | Admitting: Physical Medicine and Rehabilitation

## 2020-02-07 VITALS — BP 141/75 | HR 83

## 2020-02-07 DIAGNOSIS — M48062 Spinal stenosis, lumbar region with neurogenic claudication: Secondary | ICD-10-CM | POA: Diagnosis not present

## 2020-02-07 DIAGNOSIS — G5602 Carpal tunnel syndrome, left upper limb: Secondary | ICD-10-CM | POA: Diagnosis not present

## 2020-02-07 DIAGNOSIS — R202 Paresthesia of skin: Secondary | ICD-10-CM | POA: Diagnosis not present

## 2020-02-07 DIAGNOSIS — M5416 Radiculopathy, lumbar region: Secondary | ICD-10-CM

## 2020-02-07 MED ORDER — METHYLPREDNISOLONE ACETATE 80 MG/ML IJ SUSP
80.0000 mg | Freq: Once | INTRAMUSCULAR | Status: AC
  Administered 2020-02-07: 80 mg

## 2020-02-07 NOTE — Patient Instructions (Signed)

## 2020-02-07 NOTE — Progress Notes (Signed)
Pt state lower back pain that travels to her left groin area and down both legs. Pt state her left foot and hands goes numb sometimes. Pt has hx of inj on 07/06/19 pt state it great and lasted a few weeks  Numeric Pain Rating Scale and Functional Assessment Average Pain 6   In the last MONTH (on 0-10 scale) has pain interfered with the following?  1. General activity like being  able to carry out your everyday physical activities such as walking, climbing stairs, carrying groceries, or moving a chair?  Rating(10)   +Driver, -BT, -Dye Allergies.

## 2020-02-09 ENCOUNTER — Other Ambulatory Visit: Payer: Self-pay

## 2020-02-09 ENCOUNTER — Encounter: Payer: Self-pay | Admitting: Physical Medicine and Rehabilitation

## 2020-02-09 ENCOUNTER — Ambulatory Visit: Payer: Medicare Other | Admitting: Physical Medicine and Rehabilitation

## 2020-02-09 DIAGNOSIS — R202 Paresthesia of skin: Secondary | ICD-10-CM | POA: Diagnosis not present

## 2020-02-09 NOTE — Progress Notes (Signed)
Left hand numbness Right hand dominant No lotion per patient Numeric Pain Rating Scale and Functional Assessment Average Pain 3   In the last MONTH (on 0-10 scale) has pain interfered with the following?  1. General activity like being  able to carry out your everyday physical activities such as walking, climbing stairs, carrying groceries, or moving a chair?  Rating(1)

## 2020-02-17 ENCOUNTER — Ambulatory Visit: Payer: Medicare Other | Admitting: Orthopedic Surgery

## 2020-02-25 ENCOUNTER — Ambulatory Visit: Payer: Medicare Other | Admitting: Orthopedic Surgery

## 2020-02-25 ENCOUNTER — Encounter: Payer: Self-pay | Admitting: Physical Medicine and Rehabilitation

## 2020-02-25 DIAGNOSIS — G5602 Carpal tunnel syndrome, left upper limb: Secondary | ICD-10-CM | POA: Diagnosis not present

## 2020-02-25 NOTE — Progress Notes (Signed)
Megan [name] - 86 y.o. female MRN [id number]  Date of birth: 03-16-1934  Office Visit Note: Visit Date: 02/09/2020 PCP: Merrilee Seashore, MD Referred by: Merrilee Seashore, MD  Subjective: Chief Complaint  Patient presents with  . Left Hand - Numbness   HPI:  Megan [name] is a 86 y.o. female who comes in today  for electrodiagnostic study of the Left upper extremities.  Patient is Right hand dominant.  We saw her recently and completed epidural injection which is helped her in the past for her legs.  She does have a history of diabetes.  She has a history of prior right carpal tunnel release by Dr. Almira Bar that did quite well.  At the time the left hand was not bad enough to complete surgical release.  During the recent office visit we did look at her hand and did feel like she probably did have carpal tunnel syndrome and she is here today for that study.   ROS Otherwise per HPI.  Assessment & Plan: Visit Diagnoses:    ICD-10-CM   1. Paresthesia of skin  R20.2 NCV with EMG (electromyography)    Plan: Impression: The above electrodiagnostic study is ABNORMAL and reveals evidence of a moderate left median nerve entrapment at the wrist (carpal tunnel syndrome) affecting sensory and motor components.   There is no significant electrodiagnostic evidence of any other focal nerve entrapment, brachial plexopathy or cervical radiculopathy.   Recommendations: 1.  Follow-up with referring physician. 2.  Continue current management of symptoms. 3.  Suggest surgical evaluation.  We will get an appointment with Dr. Anderson Malta whom she has seen in the past for her shoulder.  Meds & Orders: No orders of the defined types were placed in this encounter.   Orders Placed This Encounter  Procedures  . NCV with EMG (electromyography)    Follow-up: Return for Anderson Malta, MD.   Procedures: No procedures performed  EMG & NCV Findings: Evaluation of the left median motor nerve  showed prolonged distal onset latency (5.8 ms) and decreased conduction velocity (Elbow-Wrist, 44 m/s).  The left ulnar motor nerve showed decreased conduction velocity (A Elbow-B Elbow, 50 m/s).  The left median (across palm) sensory nerve showed no response (Palm), prolonged distal peak latency (6.1 ms), and reduced amplitude (8.4 V).  The left ulnar sensory nerve showed reduced amplitude (12.7 V).  All remaining nerves (as indicated in the following tables) were within normal limits.    All examined muscles (as indicated in the following table) showed no evidence of electrical instability.    Impression: The above electrodiagnostic study is ABNORMAL and reveals evidence of a moderate left median nerve entrapment at the wrist (carpal tunnel syndrome) affecting sensory and motor components.   There is no significant electrodiagnostic evidence of any other focal nerve entrapment, brachial plexopathy or cervical radiculopathy.   Recommendations: 1.  Follow-up with referring physician. 2.  Continue current management of symptoms. 3.  Suggest surgical evaluation.  ___________________________ Laurence Spates FAAPMR Board Certified, American Board of Physical Medicine and Rehabilitation    Nerve Conduction Studies Anti Sensory Summary Table   Stim Site NR Peak (ms) Norm Peak (ms) P-T Amp (V) Norm P-T Amp Site1 Site2 Delta-P (ms) Dist (cm) Vel (m/s) Norm Vel (m/s)  Left Median Acr Palm Anti Sensory (2nd Digit)  31.8C  Wrist    *6.1 <3.6 *8.4 >10 Wrist Palm  0.0    Palm *NR  <2.0  Left Radial Anti Sensory (Base 1st Digit)  31.9C  Wrist    2.3 <3.1 19.4  Wrist Base 1st Digit 2.3 0.0    Left Ulnar Anti Sensory (5th Digit)  32.4C  Wrist    3.7 <3.7 *12.7 >15.0 Wrist 5th Digit 3.7 14.0 38 >38   Motor Summary Table   Stim Site NR Onset (ms) Norm Onset (ms) O-P Amp (mV) Norm O-P Amp Site1 Site2 Delta-0 (ms) Dist (cm) Vel (m/s) Norm Vel (m/s)  Left Median Motor (Abd Poll Brev)  31.8C   Wrist    *5.8 <4.2 9.8 >5 Elbow Wrist 5.0 22.0 *44 >50  Elbow    10.8  10.5         Left Ulnar Motor (Abd Dig Min)  31.9C  Wrist    3.5 <4.2 8.1 >3 B Elbow Wrist 3.6 19.0 53 >53  B Elbow    7.1  6.0  A Elbow B Elbow 2.0 10.0 *50 >53  A Elbow    9.1  6.2          EMG   Side Muscle Nerve Root Ins Act Fibs Psw Amp Dur Poly Recrt Int Fraser Din Comment  Left Abd Poll Brev Median C8-T1 Nml Nml Nml Nml Nml 0 Nml Nml   Left 1stDorInt Ulnar C8-T1 Nml Nml Nml Nml Nml 0 Nml Nml   Left PronatorTeres Median C6-7 Nml Nml Nml Nml Nml 0 Nml Nml   Left Biceps Musculocut C5-6 Nml Nml Nml Nml Nml 0 Nml Nml   Left Deltoid Axillary C5-6 Nml Nml Nml Nml Nml 0 Nml Nml     Nerve Conduction Studies Anti Sensory Left/Right Comparison   Stim Site L Lat (ms) R Lat (ms) L-R Lat (ms) L Amp (V) R Amp (V) L-R Amp (%) Site1 Site2 L Vel (m/s) R Vel (m/s) L-R Vel (m/s)  Median Acr Palm Anti Sensory (2nd Digit)  31.8C  Wrist *6.1   *8.4   Wrist Palm     Palm             Radial Anti Sensory (Base 1st Digit)  31.9C  Wrist 2.3   19.4   Wrist Base 1st Digit     Ulnar Anti Sensory (5th Digit)  32.4C  Wrist 3.7   *12.7   Wrist 5th Digit 38     Motor Left/Right Comparison   Stim Site L Lat (ms) R Lat (ms) L-R Lat (ms) L Amp (mV) R Amp (mV) L-R Amp (%) Site1 Site2 L Vel (m/s) R Vel (m/s) L-R Vel (m/s)  Median Motor (Abd Poll Brev)  31.8C  Wrist *5.8   9.8   Elbow Wrist *44    Elbow 10.8   10.5         Ulnar Motor (Abd Dig Min)  31.9C  Wrist 3.5   8.1   B Elbow Wrist 53    B Elbow 7.1   6.0   A Elbow B Elbow *50    A Elbow 9.1   6.2            Waveforms:             Clinical History: MRI LUMBAR SPINE WITHOUT CONTRAST  TECHNIQUE: Multiplanar, multisequence MR imaging of the lumbar spine was performed. No intravenous contrast was administered.  COMPARISON:  MRI lumbar spine 08/14/2004.  FINDINGS: Segmentation:  Standard.  Alignment: 0.6 cm anterolisthesis L4 on L5 due to facet arthropathy is  new since the prior examination. Trace anterolisthesis L5 on S1  is unchanged.  Vertebrae:  No fracture or worrisome lesion.  Conus medullaris: Extends to the L1-2 level and appears normal.  Paraspinal and other soft tissues: Negative.  Disc levels:  T11-12 is imaged in the sagittal plane only. There is a minimal central protrusion without stenosis.  T12-L1:  Minimal right paracentral protrusion without stenosis.  L1-2:  Negative.  L2-3: Mild facet degenerative disease and a minimal disc bulge without central canal or foraminal stenosis.  L3-4: Moderate facet arthropathy, ligamentum flavum thickening and a shallow disc bulge. There is moderate central canal stenosis with narrowing in the subarticular recesses which could impact either descending L4 root. The foramina are open. Spondylosis has worsened since the prior MRI.  L4-5: Advanced facet degenerative disease and bulky ligamentum flavum thickening are seen. The disc is uncovered with a shallow bulge. There is severe central canal and bilateral subarticular recess narrowing. Mild left foraminal narrowing is seen. The right foramen is open. Spondylosis is much worse than on the prior MRI.  L5-S1: Advanced facet degenerative disease is worse on the left. There is a shallow broad-based central protrusion and ligamentum flavum thickening. Narrowing is seen in the subarticular recesses. Spondylosis has worsened since the prior MRI.  IMPRESSION: Worsened spondylosis at L4-5 where there is severe central canal and bilateral subarticular recess narrowing due to bulky ligamentum flavum thickening and a shallow disc bulge. Advanced facet arthropathy at this level results in 0.6 cm anterolisthesis.  Worsened spondylosis at L3-4 where there is moderate central canal stenosis and narrowing in the subarticular recesses which could impact either descending L4 root.  Some progression spondylosis at L5-S1 where there  is a shallow broad-based central protrusion and facet arthropathy resulting in narrowing in the subarticular recesses which could irritate either descending S1 root.   Electronically Signed   By: Inge Rise M.D.   On: 11/26/2016 12:04     Objective:  VS:  HT:    WT:   BMI:     BP:   HR: bpm  TEMP: ( )  RESP:  Physical Exam Musculoskeletal:        General: No swelling, tenderness or deformity.     Comments: Inspection reveals no atrophy of the bilateral APB or FDI or hand intrinsics. She has OA changes bilaterally. There is no swelling, color changes, allodynia or dystrophic changes. There is 5 out of 5 strength in the bilateral wrist extension, finger abduction and long finger flexion. There is intact sensation to light touch in all dermatomal and peripheral nerve distributions. There is a positive Phalen's test on the left. There is a negative Hoffmann's test bilaterally.  Skin:    General: Skin is warm and dry.     Findings: No erythema or rash.  Neurological:     General: No focal deficit present.     Mental Status: She is alert and oriented to person, place, and time.     Motor: No weakness or abnormal muscle tone.     Coordination: Coordination normal.  Psychiatric:        Mood and Affect: Mood normal.        Behavior: Behavior normal.      Imaging: No results found.

## 2020-02-25 NOTE — Procedures (Signed)
EMG & NCV Findings: Evaluation of the left median motor nerve showed prolonged distal onset latency (5.8 ms) and decreased conduction velocity (Elbow-Wrist, 44 m/s).  The left ulnar motor nerve showed decreased conduction velocity (A Elbow-B Elbow, 50 m/s).  The left median (across palm) sensory nerve showed no response (Palm), prolonged distal peak latency (6.1 ms), and reduced amplitude (8.4 V).  The left ulnar sensory nerve showed reduced amplitude (12.7 V).  All remaining nerves (as indicated in the following tables) were within normal limits.    All examined muscles (as indicated in the following table) showed no evidence of electrical instability.    Impression: The above electrodiagnostic study is ABNORMAL and reveals evidence of a moderate left median nerve entrapment at the wrist (carpal tunnel syndrome) affecting sensory and motor components.   There is no significant electrodiagnostic evidence of any other focal nerve entrapment, brachial plexopathy or cervical radiculopathy.   Recommendations: 1.  Follow-up with referring physician. 2.  Continue current management of symptoms. 3.  Suggest surgical evaluation.  ___________________________ Laurence Spates FAAPMR Board Certified, American Board of Physical Medicine and Rehabilitation    Nerve Conduction Studies Anti Sensory Summary Table   Stim Site NR Peak (ms) Norm Peak (ms) P-T Amp (V) Norm P-T Amp Site1 Site2 Delta-P (ms) Dist (cm) Vel (m/s) Norm Vel (m/s)  Left Median Acr Palm Anti Sensory (2nd Digit)  31.8C  Wrist    *6.1 <3.6 *8.4 >10 Wrist Palm  0.0    Palm *NR  <2.0          Left Radial Anti Sensory (Base 1st Digit)  31.9C  Wrist    2.3 <3.1 19.4  Wrist Base 1st Digit 2.3 0.0    Left Ulnar Anti Sensory (5th Digit)  32.4C  Wrist    3.7 <3.7 *12.7 >15.0 Wrist 5th Digit 3.7 14.0 38 >38   Motor Summary Table   Stim Site NR Onset (ms) Norm Onset (ms) O-P Amp (mV) Norm O-P Amp Site1 Site2 Delta-0 (ms) Dist (cm) Vel  (m/s) Norm Vel (m/s)  Left Median Motor (Abd Poll Brev)  31.8C  Wrist    *5.8 <4.2 9.8 >5 Elbow Wrist 5.0 22.0 *44 >50  Elbow    10.8  10.5         Left Ulnar Motor (Abd Dig Min)  31.9C  Wrist    3.5 <4.2 8.1 >3 B Elbow Wrist 3.6 19.0 53 >53  B Elbow    7.1  6.0  A Elbow B Elbow 2.0 10.0 *50 >53  A Elbow    9.1  6.2          EMG   Side Muscle Nerve Root Ins Act Fibs Psw Amp Dur Poly Recrt Int Fraser Din Comment  Left Abd Poll Brev Median C8-T1 Nml Nml Nml Nml Nml 0 Nml Nml   Left 1stDorInt Ulnar C8-T1 Nml Nml Nml Nml Nml 0 Nml Nml   Left PronatorTeres Median C6-7 Nml Nml Nml Nml Nml 0 Nml Nml   Left Biceps Musculocut C5-6 Nml Nml Nml Nml Nml 0 Nml Nml   Left Deltoid Axillary C5-6 Nml Nml Nml Nml Nml 0 Nml Nml     Nerve Conduction Studies Anti Sensory Left/Right Comparison   Stim Site L Lat (ms) R Lat (ms) L-R Lat (ms) L Amp (V) R Amp (V) L-R Amp (%) Site1 Site2 L Vel (m/s) R Vel (m/s) L-R Vel (m/s)  Median Acr Palm Anti Sensory (2nd Digit)  31.8C  Wrist *6.1   *  8.4   Wrist Palm     Palm             Radial Anti Sensory (Base 1st Digit)  31.9C  Wrist 2.3   19.4   Wrist Base 1st Digit     Ulnar Anti Sensory (5th Digit)  32.4C  Wrist 3.7   *12.7   Wrist 5th Digit 38     Motor Left/Right Comparison   Stim Site L Lat (ms) R Lat (ms) L-R Lat (ms) L Amp (mV) R Amp (mV) L-R Amp (%) Site1 Site2 L Vel (m/s) R Vel (m/s) L-R Vel (m/s)  Median Motor (Abd Poll Brev)  31.8C  Wrist *5.8   9.8   Elbow Wrist *44    Elbow 10.8   10.5         Ulnar Motor (Abd Dig Min)  31.9C  Wrist 3.5   8.1   B Elbow Wrist 53    B Elbow 7.1   6.0   A Elbow B Elbow *50    A Elbow 9.1   6.2            Waveforms:

## 2020-02-25 NOTE — Procedures (Signed)
Lumbosacral Transforaminal Epidural Steroid Injection - Sub-Pedicular Approach with Fluoroscopic Guidance  [name]      Date of Birth: 13-Jan-1935 MRN: [id number] PCP: Merrilee Seashore, MD      Visit Date: 02/07/2020   Universal Protocol:    Date/Time: 02/07/2020  Consent Given By: the patient  Position: PRONE  Additional Comments: Vital signs were monitored before and after the procedure. Patient was prepped and draped in the usual sterile fashion. The correct patient, procedure, and site was verified.   Injection Procedure Details:   Procedure diagnoses: Lumbar radiculopathy [M54.16]    Meds Administered:  Meds ordered this encounter  Medications  . methylPREDNISolone acetate (DEPO-MEDROL) injection 80 mg    Laterality: Right, Left  Location/Site:  L5-S1, L4-5  Needle:5.0 in., 22 ga.  Short bevel or Quincke spinal needle  Needle Placement: Transforaminal  Findings:    -Comments: Excellent flow of contrast along the nerve, nerve root and into the epidural space.  Procedure Details: After squaring off the end-plates to get a true AP view, the C-arm was positioned so that an oblique view of the foramen as noted above was visualized. The target area is just inferior to the "nose of the scotty dog" or sub pedicular. The soft tissues overlying this structure were infiltrated with 2-3 ml. of 1% Lidocaine without Epinephrine.  The spinal needle was inserted toward the target using a "trajectory" view along the fluoroscope beam.  Under AP and lateral visualization, the needle was advanced so it did not puncture dura and was located close the 6 O'Clock position of the pedical in AP tracterory. Biplanar projections were used to confirm position. Aspiration was confirmed to be negative for CSF and/or blood. A 1-2 ml. volume of Isovue-250 was injected and flow of contrast was noted at each level. Radiographs were obtained for documentation purposes.   After  attaining the desired flow of contrast documented above, a 0.5 to 1.0 ml test dose of 0.25% Marcaine was injected into each respective transforaminal space.  The patient was observed for 90 seconds post injection.  After no sensory deficits were reported, and normal lower extremity motor function was noted,   the above injectate was administered so that equal amounts of the injectate were placed at each foramen (level) into the transforaminal epidural space.   Additional Comments:  The patient tolerated the procedure well Dressing: 2 x 2 sterile gauze and Band-Aid    Post-procedure details: Patient was observed during the procedure. Post-procedure instructions were reviewed.  Patient left the clinic in stable condition.

## 2020-02-25 NOTE — Progress Notes (Signed)
Megan [name] - 87 y.o. female MRN [id number]  Date of birth: 1934-04-29  Office Visit Note: Visit Date: 02/07/2020 PCP: Merrilee Seashore, MD Referred by: Merrilee Seashore, MD  Subjective: Chief Complaint  Patient presents with  . Lower Back - Pain  . Left Foot - Numbness  . Left Hand - Numbness  . Left Leg - Pain  . Right Leg - Pain   HPI: Megan [name] is a 86 y.o. female who comes in today For evaluation and management of chronic worsening severe low back pain and bilateral radicular hip and leg pain but also now with left hand numbness.  Patient is well known to me and we see her a couple times a year for lumbar transforaminal epidural steroid injections for lateral recess narrowing and central canal stenosis.  She has had no prior lumbar surgery.  Last time we saw her was in June of last year.  Her case is complicated by type 2 diabetes and aortic stenosis which now she has had aortic valve replacement.  She also has hypothyroidism.  She has been followed by neurology with no history of polyneuropathy.  In terms of her back and leg pain we have been doing a left L4 and L5 on the right transforaminal injection with good relief and she feels like this is the same symptoms as before.  No trauma no red flag complaints just worsening pain again.  MRI once again reviewed.  She has seen Dr. Louanne Skye in the past but I have been seeing her more recently over the last several years.  She is also been seen in the past by Dr. Anderson Malta for her shoulder.  In terms of her left hand she is having pain numbness and tingling in the radial 3 digits worse at night and with certain positions.  She does have a positive flick sign.  She has had prior right carpal tunnel release by Dr. Almira Bar in 2001.  That was prior to me joining the practice and the nerve study that was done at the time was done from an outside source.  Review of Systems  Musculoskeletal: Positive for back pain and joint pain.   Neurological: Positive for tingling.  All other systems reviewed and are negative.  Otherwise per HPI.  Assessment & Plan: Visit Diagnoses:    ICD-10-CM   1. Lumbar radiculopathy  M54.16 XR C-ARM NO REPORT    Epidural Steroid injection    methylPREDNISolone acetate (DEPO-MEDROL) injection 80 mg  2. Spinal stenosis of lumbar region with neurogenic claudication  M48.062 XR C-ARM NO REPORT    Epidural Steroid injection    methylPREDNISolone acetate (DEPO-MEDROL) injection 80 mg  3. Paresthesia of skin  R20.2   4. Carpal tunnel syndrome, left upper limb  G56.02      Plan: Findings:  1.  Chronic worsening severe low back and bilateral hip and leg pain consistent with lumbar radiculopathy from lumbar stenosis and lateral recess stenosis.  Again she has been doing well with a couple of injections or so during the year and these have been transforaminal injections on the left at L4 and on the right at L5.  I do think she needs that repeated at this point and they are therapeutic at this point.  She is tried and failed all manner of conservative care in the past and was not deemed a really good candidate for lumbar laminectomy decompression.  She is now having complications with heart valve replacement and diabetes etc.  We will repeat the injection and see how she does.  2.  In terms of her left hand pain and numbness this does seem like a carpal tunnel syndrome.  She has signs and symptoms and exam findings consistent with that.  She has had prior carpal tunnel release on the right and did well.  We will go ahead and schedule a nerve conduction study for the left hand to rule out polyneuropathy or radiculopathy as well.  We will have her follow-up with Dr. Anderson Malta if that is a positive test.    Meds & Orders:  Meds ordered this encounter  Medications  . methylPREDNISolone acetate (DEPO-MEDROL) injection 80 mg    Orders Placed This Encounter  Procedures  . XR C-ARM NO REPORT  . Epidural  Steroid injection    Follow-up: Return for Left Hand nerve conduction.   Procedures: No procedures performed  Lumbosacral Transforaminal Epidural Steroid Injection - Sub-Pedicular Approach with Fluoroscopic Guidance  [name]      Date of Birth: 1934/10/17 MRN: [id number] PCP: Merrilee Seashore, MD      Visit Date: 02/07/2020   Universal Protocol:    Date/Time: 02/07/2020  Consent Given By: the patient  Position: PRONE  Additional Comments: Vital signs were monitored before and after the procedure. Patient was prepped and draped in the usual sterile fashion. The correct patient, procedure, and site was verified.   Injection Procedure Details:   Procedure diagnoses: Lumbar radiculopathy [M54.16]    Meds Administered:  Meds ordered this encounter  Medications  . methylPREDNISolone acetate (DEPO-MEDROL) injection 80 mg    Laterality: Right, Left  Location/Site:  L5-S1, L4-5  Needle:5.0 in., 22 ga.  Short bevel or Quincke spinal needle  Needle Placement: Transforaminal  Findings:    -Comments: Excellent flow of contrast along the nerve, nerve root and into the epidural space.  Procedure Details: After squaring off the end-plates to get a true AP view, the C-arm was positioned so that an oblique view of the foramen as noted above was visualized. The target area is just inferior to the "nose of the scotty dog" or sub pedicular. The soft tissues overlying this structure were infiltrated with 2-3 ml. of 1% Lidocaine without Epinephrine.  The spinal needle was inserted toward the target using a "trajectory" view along the fluoroscope beam.  Under AP and lateral visualization, the needle was advanced so it did not puncture dura and was located close the 6 O'Clock position of the pedical in AP tracterory. Biplanar projections were used to confirm position. Aspiration was confirmed to be negative for CSF and/or blood. A 1-2 ml. volume of Isovue-250 was injected  and flow of contrast was noted at each level. Radiographs were obtained for documentation purposes.   After attaining the desired flow of contrast documented above, a 0.5 to 1.0 ml test dose of 0.25% Marcaine was injected into each respective transforaminal space.  The patient was observed for 90 seconds post injection.  After no sensory deficits were reported, and normal lower extremity motor function was noted,   the above injectate was administered so that equal amounts of the injectate were placed at each foramen (level) into the transforaminal epidural space.   Additional Comments:  The patient tolerated the procedure well Dressing: 2 x 2 sterile gauze and Band-Aid    Post-procedure details: Patient was observed during the procedure. Post-procedure instructions were reviewed.  Patient left the clinic in stable condition.      Clinical History: MRI  LUMBAR SPINE WITHOUT CONTRAST  TECHNIQUE: Multiplanar, multisequence MR imaging of the lumbar spine was performed. No intravenous contrast was administered.  COMPARISON:  MRI lumbar spine 08/14/2004.  FINDINGS: Segmentation:  Standard.  Alignment: 0.6 cm anterolisthesis L4 on L5 due to facet arthropathy is new since the prior examination. Trace anterolisthesis L5 on S1 is unchanged.  Vertebrae:  No fracture or worrisome lesion.  Conus medullaris: Extends to the L1-2 level and appears normal.  Paraspinal and other soft tissues: Negative.  Disc levels:  T11-12 is imaged in the sagittal plane only. There is a minimal central protrusion without stenosis.  T12-L1:  Minimal right paracentral protrusion without stenosis.  L1-2:  Negative.  L2-3: Mild facet degenerative disease and a minimal disc bulge without central canal or foraminal stenosis.  L3-4: Moderate facet arthropathy, ligamentum flavum thickening and a shallow disc bulge. There is moderate central canal stenosis with narrowing in the subarticular  recesses which could impact either descending L4 root. The foramina are open. Spondylosis has worsened since the prior MRI.  L4-5: Advanced facet degenerative disease and bulky ligamentum flavum thickening are seen. The disc is uncovered with a shallow bulge. There is severe central canal and bilateral subarticular recess narrowing. Mild left foraminal narrowing is seen. The right foramen is open. Spondylosis is much worse than on the prior MRI.  L5-S1: Advanced facet degenerative disease is worse on the left. There is a shallow broad-based central protrusion and ligamentum flavum thickening. Narrowing is seen in the subarticular recesses. Spondylosis has worsened since the prior MRI.  IMPRESSION: Worsened spondylosis at L4-5 where there is severe central canal and bilateral subarticular recess narrowing due to bulky ligamentum flavum thickening and a shallow disc bulge. Advanced facet arthropathy at this level results in 0.6 cm anterolisthesis.  Worsened spondylosis at L3-4 where there is moderate central canal stenosis and narrowing in the subarticular recesses which could impact either descending L4 root.  Some progression spondylosis at L5-S1 where there is a shallow broad-based central protrusion and facet arthropathy resulting in narrowing in the subarticular recesses which could irritate either descending S1 root.   Electronically Signed   By: Inge Rise M.D.   On: 11/26/2016 12:04   She reports that she has never smoked. She has never used smokeless tobacco. No results for input(s): HGBA1C, LABURIC in the last 8760 hours.  Objective:  VS:  HT:    WT:   BMI:     BP:(!) 141/75  HR:83bpm  TEMP: ( )  RESP:  Physical Exam Vitals and nursing note reviewed.  Constitutional:      General: She is not in acute distress.    Appearance: Normal appearance. She is not ill-appearing.  HENT:     Head: Normocephalic and atraumatic.     Right Ear: External ear  normal.     Left Ear: External ear normal.  Eyes:     Extraocular Movements: Extraocular movements intact.  Cardiovascular:     Rate and Rhythm: Normal rate.     Pulses: Normal pulses.  Pulmonary:     Effort: Pulmonary effort is normal. No respiratory distress.  Abdominal:     General: There is no distension.     Palpations: Abdomen is soft.  Musculoskeletal:        General: Tenderness present.     Cervical back: Neck supple.     Right lower leg: No edema.     Left lower leg: No edema.     Comments: Patient has good distal strength  with no pain over the greater trochanters.  No clonus or focal weakness.  Examination of the hands shows osteoarthritic changes in both hands.  She has a positive Phalen's test on the left and not the right.  She has intact sensation all dermatomes in peripheral nerve distribution.  She has negative Hoffmann's test bilaterally.  Skin:    Findings: No erythema, lesion or rash.  Neurological:     General: No focal deficit present.     Mental Status: She is alert and oriented to person, place, and time.     Sensory: No sensory deficit.     Motor: No weakness or abnormal muscle tone.     Coordination: Coordination normal.     Gait: Gait abnormal.  Psychiatric:        Mood and Affect: Mood normal.        Behavior: Behavior normal.     Ortho Exam  Imaging: No results found.  Past Medical/Family/Surgical/Social History: Medications & Allergies reviewed per EMR, new medications updated. Patient Active Problem List   Diagnosis Date Noted  . Dyspnea on exertion 03/23/2018  . Other secondary pulmonary hypertension ([place]) 03/23/2018  . S/P TAVR (transcatheter aortic valve replacement) 03/03/2018  . Diabetes mellitus without complication ([place])   . Hypertension   . Hypothyroidism   . Anterior ischemic optic neuropathy of right eye 05/28/2017  . Snoring 05/28/2017  . OSA (obstructive sleep apnea) 05/28/2017  . Shoulder arthritis 10/29/2016  . Severe  aortic stenosis 06/21/2016  . Lightheaded 06/05/2016  . Poor balance 06/05/2016  . Sensorineural hearing loss (SNHL), bilateral 02/02/2016  . Arthritis of shoulder region, left, degenerative 07/14/2014   Past Medical History:  Diagnosis Date  . Anemia   . Arthritis   . Cancer ([place]) 1989   melanoma  . Diabetes mellitus without complication ([place])   . Dizziness   . GERD (gastroesophageal reflux disease)   . History of hiatal hernia   . Hypertension   . Hypothyroidism   . IBS (irritable bowel syndrome)   . S/P TAVR (transcatheter aortic valve replacement) 03/03/2018   s/p 23 mm Edwards Sapien THV via the TF approach  . Severe aortic stenosis    Family History  Problem Relation Age of Onset  . Hypertension Mother   . Hypertension Father   . Heart disease Father   . Heart disease Brother    Past Surgical History:  Procedure Laterality Date  . APPENDECTOMY     86 yrs old  . CARPAL TUNNEL RELEASE Right 10/1999   Myerdierks, MD  . CHOLECYSTECTOMY  95  . COLONOSCOPY    . EYE SURGERY Bilateral 2017   cataract surgery with lens implants  . JOINT REPLACEMENT Right    knee   . REVERSE SHOULDER ARTHROPLASTY Left 07/14/2014  . REVERSE SHOULDER ARTHROPLASTY Left 07/14/2014   Procedure: LEFT REVERSE SHOULDER ARTHROPLASTY;  Surgeon: Meredith Pel, MD;  Location: [facility];  Service: Orthopedics;  Laterality: Left;  . RIGHT/LEFT HEART CATH AND CORONARY ANGIOGRAPHY N/A 01/29/2018   Procedure: RIGHT/LEFT HEART CATH AND CORONARY ANGIOGRAPHY;  Surgeon: Belva Crome, MD;  Location: [facility];  Service: Cardiovascular;  Laterality: N/A;  . SHOULDER ARTHROSCOPY Left   . TEE WITHOUT CARDIOVERSION N/A 03/03/2018   Procedure: TRANSESOPHAGEAL ECHOCARDIOGRAM (TEE);  Surgeon: Sherren Mocha, MD;  Location: [facility];  Service: Open Heart Surgery;  Laterality: N/A;  . TOTAL SHOULDER REVISION Right 10/29/2016   Procedure: RIGHT REVERSE TOTAL SHOULDER;  Surgeon: Meredith Pel,  MD;  Location: [facility];  Service: Orthopedics;  Laterality: Right;  . TRANSCATHETER AORTIC VALVE REPLACEMENT, TRANSFEMORAL N/A 03/03/2018   Procedure: TRANSCATHETER AORTIC VALVE REPLACEMENT, TRANSFEMORAL;  Surgeon: Sherren Mocha, MD;  Location: [facility];  Service: Open Heart Surgery;  Laterality: N/A;   Social History   Occupational History  . Not on file  Tobacco Use  . Smoking status: Never Smoker  . Smokeless tobacco: Never Used  Vaping Use  . Vaping Use: Never used  Substance and Sexual Activity  . Alcohol use: No  . Drug use: No  . Sexual activity: Not on file

## 2020-02-26 ENCOUNTER — Encounter: Payer: Self-pay | Admitting: Orthopedic Surgery

## 2020-02-26 NOTE — Progress Notes (Signed)
Office Visit Note   [name]           Date of Birth: 09/10/1934           MRN: [id number] Visit Date: 02/25/2020 Requested by: Megan [name], [place] Megan [name],  Megan [name] PCP: Megan Seashore, MD  Subjective: Chief Complaint  Patient presents with  . Other     EMG/NCV review    HPI: Megan [name] is a patient with right and left wrist pain.  Since have seen her she has had left nerve conduction study which shows moderate carpal tunnel syndrome.  She does report episodic numbness and paresthesias.  She does not have a night splint and does not think that she would use it.  Has a history of right carpal tunnel release done about 20 years ago.              ROS: All systems reviewed are negative as they relate to the chief complaint within the history of present illness.  Patient denies  fevers or chills.   Assessment & Plan: Visit Diagnoses:  1. Carpal tunnel syndrome, left upper limb     Plan: Impression is moderate carpal tunnel syndrome on the left-hand side which does not really reach the threshold for intervention at this time.  Discussed with Megan [name] various options including night splint as well as ultrasound-guided injection into the carpal tunnel.  She will consider that option in the future if her symptoms worsen.  For now she is at a place where she can live with what she has.  Follow-up as needed.  Follow-Up Instructions: Return if symptoms worsen or fail to improve.   Orders:  No orders of the defined types were placed in this encounter.  No orders of the defined types were placed in this encounter.     Procedures: No procedures performed   Clinical Data: No additional findings.  Objective: Vital Signs: LMP  (LMP Unknown)   Physical Exam:   Constitutional: Patient appears well-developed HEENT:  Head: Normocephalic Eyes:EOM are normal Neck: Normal range of motion Cardiovascular: Normal rate Pulmonary/chest:  Effort normal Neurologic: Patient is alert Skin: Skin is warm Psychiatric: Patient has normal mood and affect    Ortho Exam: Ortho exam demonstrates only mild abductor pollicis brevis wasting on the left-hand side.  5 out of 5 grip EPL FPL interosseous wrist flexion wrist extension strength.  Radial pulses intact.  She has very good abductor pollicis brevis strength.  Negative Tinel's cubital tunnel on the left elbow.  Wrist flexion extension intact  Specialty Comments:  No specialty comments available.  Imaging: No results found.   PMFS History: Patient Active Problem List   Diagnosis Date Noted  . Dyspnea on exertion 03/23/2018  . Other secondary pulmonary hypertension ([place]) 03/23/2018  . S/P TAVR (transcatheter aortic valve replacement) 03/03/2018  . Diabetes mellitus without complication ([place])   . Hypertension   . Hypothyroidism   . Anterior ischemic optic neuropathy of right eye 05/28/2017  . Snoring 05/28/2017  . OSA (obstructive sleep apnea) 05/28/2017  . Shoulder arthritis 10/29/2016  . Severe aortic stenosis 06/21/2016  . Lightheaded 06/05/2016  . Poor balance 06/05/2016  . Sensorineural hearing loss (SNHL), bilateral 02/02/2016  . Arthritis of shoulder region, left, degenerative 07/14/2014   Past Medical History:  Diagnosis Date  . Anemia   . Arthritis   . Cancer ([place]) 1989   melanoma  . Diabetes mellitus without complication ([place])   . Dizziness   .  GERD (gastroesophageal reflux disease)   . History of hiatal hernia   . Hypertension   . Hypothyroidism   . IBS (irritable bowel syndrome)   . S/P TAVR (transcatheter aortic valve replacement) 03/03/2018   s/p 23 mm Edwards Sapien THV via the TF approach  . Severe aortic stenosis     Family History  Problem Relation Age of Onset  . Hypertension Mother   . Hypertension Father   . Heart disease Father   . Heart disease Brother     Past Surgical History:  Procedure Laterality Date  . APPENDECTOMY     86  yrs old  . CARPAL TUNNEL RELEASE Right 10/1999   Myerdierks, MD  . CHOLECYSTECTOMY  95  . COLONOSCOPY    . EYE SURGERY Bilateral 2017   cataract surgery with lens implants  . JOINT REPLACEMENT Right    knee   . REVERSE SHOULDER ARTHROPLASTY Left 07/14/2014  . REVERSE SHOULDER ARTHROPLASTY Left 07/14/2014   Procedure: LEFT REVERSE SHOULDER ARTHROPLASTY;  Surgeon: Meredith Pel, MD;  Location: [facility];  Service: Orthopedics;  Laterality: Left;  . RIGHT/LEFT HEART CATH AND CORONARY ANGIOGRAPHY N/A 01/29/2018   Procedure: RIGHT/LEFT HEART CATH AND CORONARY ANGIOGRAPHY;  Surgeon: Belva Crome, MD;  Location: [facility];  Service: Cardiovascular;  Laterality: N/A;  . SHOULDER ARTHROSCOPY Left   . TEE WITHOUT CARDIOVERSION N/A 03/03/2018   Procedure: TRANSESOPHAGEAL ECHOCARDIOGRAM (TEE);  Surgeon: Sherren Mocha, MD;  Location: [facility];  Service: Open Heart Surgery;  Laterality: N/A;  . TOTAL SHOULDER REVISION Right 10/29/2016   Procedure: RIGHT REVERSE TOTAL SHOULDER;  Surgeon: Meredith Pel, MD;  Location: [facility];  Service: Orthopedics;  Laterality: Right;  . TRANSCATHETER AORTIC VALVE REPLACEMENT, TRANSFEMORAL N/A 03/03/2018   Procedure: TRANSCATHETER AORTIC VALVE REPLACEMENT, TRANSFEMORAL;  Surgeon: Sherren Mocha, MD;  Location: [facility];  Service: Open Heart Surgery;  Laterality: N/A;   Social History   Occupational History  . Not on file  Tobacco Use  . Smoking status: Never Smoker  . Smokeless tobacco: Never Used  Vaping Use  . Vaping Use: Never used  Substance and Sexual Activity  . Alcohol use: No  . Drug use: No  . Sexual activity: Not on file

## 2020-02-29 ENCOUNTER — Other Ambulatory Visit (INDEPENDENT_AMBULATORY_CARE_PROVIDER_SITE_OTHER): Payer: Self-pay | Admitting: Physical Medicine and Rehabilitation

## 2020-02-29 NOTE — Telephone Encounter (Signed)
Please advise 

## 2020-03-21 DIAGNOSIS — H35039 Hypertensive retinopathy, unspecified eye: Secondary | ICD-10-CM | POA: Diagnosis not present

## 2020-03-21 DIAGNOSIS — E118 Type 2 diabetes mellitus with unspecified complications: Secondary | ICD-10-CM | POA: Diagnosis not present

## 2020-03-21 DIAGNOSIS — I10 Essential (primary) hypertension: Secondary | ICD-10-CM | POA: Diagnosis not present

## 2020-03-21 DIAGNOSIS — E039 Hypothyroidism, unspecified: Secondary | ICD-10-CM | POA: Diagnosis not present

## 2020-03-21 DIAGNOSIS — E119 Type 2 diabetes mellitus without complications: Secondary | ICD-10-CM | POA: Diagnosis not present

## 2020-03-21 DIAGNOSIS — E782 Mixed hyperlipidemia: Secondary | ICD-10-CM | POA: Diagnosis not present

## 2020-03-24 NOTE — Progress Notes (Signed)
Date:  03/30/2020   ID:  EMY ANGEVINE, DOB March 27, 1934, MRN [id number]  Provider Location: Office  PCP:  Merrilee Seashore, MD  Cardiologist:   Johnsie Cancel Electrophysiologist:  None   Evaluation Performed:  Follow-Up Visit  Chief Complaint:  AS/TAVR  History of Present Illness:    86 y.o. with severe AS Successful TAVR with 23 mm Sapien 3 TF approach 03/03/18. Post op echo with mean gradient elevated 19 mmHg No PVL Has seen Dr Lamonte Sakai for pulmonary HTN He hopes this improves with Rx of her left sided valve disease Cath done 01/29/18 prior to TAVR no CAD. Measured PA pressure on right heart cath only 33/12 with mean 22 mmHg. Mean PCWP 10 mmHg No CAD  No complaints Husband passed 7 years ago had been married 90 years Son lives with her and helps out Activity limited by arthritis in knees and back   Post TAVR echo 09/30/19 MS mean gradient 11 MVA 2 cm2 MAC with mild MR Not surgical candidate stable TAVR valve no AR mean gradient 15 mmHg peak 25 mmHg DVI 0.43 AVA 1.3 cm2   ECG with chronic RBBB  Monitor 04/09/19 with average HR 77 bpm <1% PAC/PVC''s   Has been vaccinated Some carpal tunnel left hand seen by Dr Otis Peak a week ago Not clear what happened Injured her right shoulder and seeing Dr Marlou Sa in am May need Surgery She has had both shoulders replaced already  She had no warning no palpitations, dyspnea, chest pain prior to event Has had episodes in past Of ? Vertigo and been w/u by neuro with normal MRI/MRA With normal EF, no CAD and normal functioning TAVR valve this is unlikely to be cardiac related    Past Medical History:  Diagnosis Date   Anemia    Arthritis    Cancer ([place]) 1989   melanoma   Diabetes mellitus without complication (HCC)    Dizziness    GERD (gastroesophageal reflux disease)    History of hiatal hernia    Hypertension    Hypothyroidism    IBS (irritable bowel syndrome)    S/P TAVR (transcatheter aortic valve replacement) 03/03/2018    s/p 23 mm Edwards Sapien THV via the TF approach   Severe aortic stenosis    Past Surgical History:  Procedure Laterality Date   APPENDECTOMY     86 yrs old   CARPAL TUNNEL RELEASE Right 10/1999   Myerdierks, MD   CHOLECYSTECTOMY  95   COLONOSCOPY     EYE SURGERY Bilateral 2017   cataract surgery with lens implants   JOINT REPLACEMENT Right    knee    REVERSE SHOULDER ARTHROPLASTY Left 07/14/2014   REVERSE SHOULDER ARTHROPLASTY Left 07/14/2014   Procedure: LEFT REVERSE SHOULDER ARTHROPLASTY;  Surgeon: Meredith Pel, MD;  Location: [facility];  Service: Orthopedics;  Laterality: Left;   RIGHT/LEFT HEART CATH AND CORONARY ANGIOGRAPHY N/A 01/29/2018   Procedure: RIGHT/LEFT HEART CATH AND CORONARY ANGIOGRAPHY;  Surgeon: Belva Crome, MD;  Location: [facility];  Service: Cardiovascular;  Laterality: N/A;   SHOULDER ARTHROSCOPY Left    TEE WITHOUT CARDIOVERSION N/A 03/03/2018   Procedure: TRANSESOPHAGEAL ECHOCARDIOGRAM (TEE);  Surgeon: Sherren Mocha, MD;  Location: [facility];  Service: Open Heart Surgery;  Laterality: N/A;   TOTAL SHOULDER REVISION Right 10/29/2016   Procedure: RIGHT REVERSE TOTAL SHOULDER;  Surgeon: Meredith Pel, MD;  Location: [facility];  Service: Orthopedics;  Laterality: Right;  TRANSCATHETER AORTIC VALVE REPLACEMENT, TRANSFEMORAL N/A 03/03/2018   Procedure: TRANSCATHETER AORTIC VALVE REPLACEMENT, TRANSFEMORAL;  Surgeon: Sherren Mocha, MD;  Location: [facility];  Service: Open Heart Surgery;  Laterality: N/A;     Current Meds  Medication Sig   ACCU-CHEK AVIVA PLUS test strip 1 each by Other route 2 (two) times daily.    ACCU-CHEK SOFTCLIX LANCETS lancets 1 each by Other route 2 (two) times daily.    acetaminophen (TYLENOL) 500 MG tablet Take 500-1,000 mg by mouth every 6 (six) hours as needed (pain.).   amLODipine-valsartan (EXFORGE) 5-160 MG tablet Take 1 tablet by mouth daily.   aspirin 81 MG chewable tablet Chew 1  tablet (81 mg total) by mouth daily.   calcium-vitamin D (OSCAL WITH D) 500-200 MG-UNIT tablet Take 1 tablet by mouth daily at 12 noon.    econazole nitrate 1 % cream Apply 1 application topically daily. For nose/corners of mouth   Ergocalciferol (VITAMIN D2 PO) Take 2,000 mg by mouth.   gabapentin (NEURONTIN) 100 MG capsule TAKE 1 CAPSULE (100 MG TOTAL) BY MOUTH AT BEDTIME.   glimepiride (AMARYL) 2 MG tablet Take 2 mg by mouth See admin instructions. Take 2 mg by mouth in the morning and 4 mg by mouth at night.   hydrocortisone 2.5 % cream Apply 1 application topically daily. Applied to nose/corners of mouth   Multiple Vitamin (MULTIVITAMIN WITH MINERALS) TABS tablet Take 1 tablet by mouth daily at 12 noon.    MYRBETRIQ 50 MG TB24 tablet Take 50 mg by mouth daily.   Omega-3 Fatty Acids (FISH OIL) 1200 MG CAPS Take 1,200 mg by mouth daily at 12 noon.    rosuvastatin (CRESTOR) 5 MG tablet Take 5 mg by mouth daily.   sitaGLIPtin (JANUVIA) 100 MG tablet Take 100 mg by mouth daily.   SYNTHROID 125 MCG tablet Take 125 mcg by mouth daily before breakfast. Take 5 days a week   triamterene-hydrochlorothiazide (DYAZIDE) 37.5-25 MG per capsule Take 1 capsule by mouth at bedtime.     Allergies:   Patient has no known allergies.   Social History   Tobacco Use   Smoking status: Never Smoker   Smokeless tobacco: Never Used  Vaping Use   Vaping Use: Never used  Substance Use Topics   Alcohol use: No   Drug use: No     Family Hx: The patient's family history includes Heart disease in her brother and father; Hypertension in her father and mother.  ROS:   Please see the history of present illness.     All other systems reviewed and are negative.   Prior CV studies:   The following studies were reviewed today:  Echo 04/09/18 EF >65% trivial PVL mean gradient 16 mmHg peak 33 mmHg Echo 03/10/19 see HPI Monitor  04/21/19   Labs/Other Tests and Data Reviewed:    EKG:  SR rate  84 RBBB  09/29/19   Recent Labs: No results found for requested labs within last 8760 hours.   Recent Lipid Panel No results found for: CHOL, TRIG, HDL, CHOLHDL, LDLCALC, LDLDIRECT  Wt Readings from Last 3 Encounters:  03/30/20 93.4 kg  09/29/19 90.3 kg  04/05/19 88.7 kg     Objective:    Vital Signs:  BP (!) 134/58    Pulse 78    Ht _0  (1.626 m)    Wt 93.4 kg    LMP  (LMP Unknown)    SpO2 94%    BMI 35.36 kg/m  Affect appropriate Healthy:  appears stated age 82: normal Neck supple with no adenopathy JVP normal no bruits no thyromegaly Lungs clear with no wheezing and good diaphragmatic motion Heart:  S1/S2 SEM no AR  murmur, no rub, gallop or click PMI normal Abdomen: benighn, BS positve, no tenderness, no AAA no bruit.  No HSM or HJR Distal pulses intact with no bruits No edema Neuro non-focal Skin warm and dry No muscular weakness   ASSESSMENT & PLAN:    AS/TAVR:  03/01/18 23 mm Edwards Sapien 3 DAT SBE prophylaxis. Gradients elevated from insertion date DVI ok 0.43 mean gradient 16 peak 25 AVA 1.3 cm2 By TTE 09/30/19   MS:  MAC with mean gradient 11 mmHg at HR 75 bpm  MVA 2 cm2 mild MR Not surgical candidate   HTN:  Well controlled.  Continue current medications and low sodium Dash type diet.  Currently on Dyazide and exforge   Thyroid:  On replacement TSH normal   DM:  Discussed low carb diet.  Target hemoglobin A1c is 6.5 or less.  Continue current medications.  HLD:  On crestor f/u labs with primary   Otho:  Post fall with displaced clavicular fracture Sling-> Dr Dr Marry Guan to have surgery from cardiac perspective if needed     COVID-19 Education: The signs and symptoms of COVID-19 were discussed with the patient and how to seek care for testing (follow up with PCP or arrange E-visit).  The importance of social distancing was discussed today.   Medication Adjustments/Labs and Tests Ordered: Current medicines are reviewed at length with the patient  today.  Concerns regarding medicines are outlined above.   Tests Ordered:  None  Medication Changes:  None   Disposition:  Follow up with cardiology in  A year   Signed, Jenkins Rouge, MD  03/30/2020 11:17 AM    [place]

## 2020-03-27 DIAGNOSIS — E1122 Type 2 diabetes mellitus with diabetic chronic kidney disease: Secondary | ICD-10-CM | POA: Diagnosis not present

## 2020-03-27 DIAGNOSIS — N1831 Chronic kidney disease, stage 3a: Secondary | ICD-10-CM | POA: Diagnosis not present

## 2020-03-27 DIAGNOSIS — I129 Hypertensive chronic kidney disease with stage 1 through stage 4 chronic kidney disease, or unspecified chronic kidney disease: Secondary | ICD-10-CM | POA: Diagnosis not present

## 2020-03-28 ENCOUNTER — Ambulatory Visit: Payer: Medicare Other | Admitting: Cardiovascular Disease

## 2020-03-28 DIAGNOSIS — S42021A Displaced fracture of shaft of right clavicle, initial encounter for closed fracture: Secondary | ICD-10-CM | POA: Diagnosis not present

## 2020-03-30 ENCOUNTER — Encounter: Payer: Self-pay | Admitting: Cardiovascular Disease

## 2020-03-30 ENCOUNTER — Ambulatory Visit: Payer: Medicare Other | Admitting: Cardiovascular Disease

## 2020-03-30 ENCOUNTER — Other Ambulatory Visit: Payer: Self-pay

## 2020-03-30 VITALS — BP 134/58 | HR 78 | Ht 64.0 in | Wt 206.0 lb

## 2020-03-30 DIAGNOSIS — Z952 Presence of prosthetic heart valve: Secondary | ICD-10-CM | POA: Diagnosis not present

## 2020-03-30 NOTE — Patient Instructions (Signed)

## 2020-03-31 ENCOUNTER — Ambulatory Visit: Payer: Medicare Other | Admitting: Orthopedic Surgery

## 2020-03-31 DIAGNOSIS — S42021A Displaced fracture of shaft of right clavicle, initial encounter for closed fracture: Secondary | ICD-10-CM

## 2020-04-02 ENCOUNTER — Encounter: Payer: Self-pay | Admitting: Orthopedic Surgery

## 2020-04-02 NOTE — Progress Notes (Signed)
Office Visit Note   [name]           Date of Birth: 05-30-1934           MRN: [id number] Visit Date: 03/31/2020 Requested by: Merrilee Seashore, [address] [facility] [address] [address] [place],  [place] [address] PCP: Merrilee Seashore, MD  Subjective: Chief Complaint  Patient presents with   Right Shoulder - Pain    HPI: Megan [name] is a patient with right shoulder pain.  Date of injury February 28.  Went to urgent care 03/28/2020 and advised that she had a clavicle fracture.  She did fall on that right side.  She has a reverse shoulder replacement placed on that side 3 years ago.  She is right-hand dominant.  Denies any other orthopedic complaints.  No syncopal episode which caused the fall.              ROS: All systems reviewed are negative as they relate to the chief complaint within the history of present illness.  Patient denies  fevers or chills.   Assessment & Plan: Visit Diagnoses:  1. Closed displaced fracture of shaft of right clavicle, initial encounter     Plan: Impression is displaced right clavicle fracture with otherwise no evidence of injury to the reverse shoulder replacement.  Plan is to keep in the sling until her return office visit on March 23.  Repeat radiographs at that time.  No indication for operative treatment at this time as I think this clavicle fracture will likely heal.  More concerning is the possibility for loosening of the humeral or glenoid component after the fall.  Certainly a fall which causes the displaced clavicle fracture has the potential to cause disruption of the prosthetic bone interface.  We will need clinical reexamination and radiographs upon return visit.  Follow-Up Instructions: No follow-ups on file.   Orders:  No orders of the defined types were placed in this encounter.  No orders of the defined types were placed in this encounter.     Procedures: No procedures performed   Clinical Data: No additional  findings.  Objective: Vital Signs: LMP  (LMP Unknown)   Physical Exam:   Constitutional: Patient appears well-developed HEENT:  Head: Normocephalic Eyes:EOM are normal Neck: Normal range of motion Cardiovascular: Normal rate Pulmonary/chest: Effort normal Neurologic: Patient is alert Skin: Skin is warm Psychiatric: Patient has normal mood and affect    Ortho Exam: Ortho exam demonstrates functional deltoid on the right.  I can move the right shoulder around passively with no coarse grinding or crepitus.  Deltoid is functional.  Motor or sensory function to the hand is intact.  Specialty Comments:  No specialty comments available.  Imaging: No results found.   PMFS History: Patient Active Problem List   Diagnosis Date Noted   Dyspnea on exertion 03/23/2018   Other secondary pulmonary hypertension ([facility]) 03/23/2018   S/P TAVR (transcatheter aortic valve replacement) 03/03/2018   Diabetes mellitus without complication (HCC)    Hypertension    Hypothyroidism    Anterior ischemic optic neuropathy of right eye 05/28/2017   Snoring 05/28/2017   OSA (obstructive sleep apnea) 05/28/2017   Shoulder arthritis 10/29/2016   Severe aortic stenosis 06/21/2016   Lightheaded 06/05/2016   Poor balance 06/05/2016   Sensorineural hearing loss (SNHL), bilateral 02/02/2016   Arthritis of shoulder region, left, degenerative 07/14/2014   Past Medical History:  Diagnosis Date   Anemia    Arthritis    Cancer ([facility]) 1989  melanoma   Diabetes mellitus without complication (HCC)    Dizziness    GERD (gastroesophageal reflux disease)    History of hiatal hernia    Hypertension    Hypothyroidism    IBS (irritable bowel syndrome)    S/P TAVR (transcatheter aortic valve replacement) 03/03/2018   s/p 23 mm Edwards Sapien THV via the TF approach   Severe aortic stenosis     Family History  Problem Relation Age of Onset   Hypertension Mother    Hypertension  Father    Heart disease Father    Heart disease Brother     Past Surgical History:  Procedure Laterality Date   APPENDECTOMY     86 yrs old   CARPAL TUNNEL RELEASE Right 10/1999   Myerdierks, MD   CHOLECYSTECTOMY  95   COLONOSCOPY     EYE SURGERY Bilateral 2017   cataract surgery with lens implants   JOINT REPLACEMENT Right    knee    REVERSE SHOULDER ARTHROPLASTY Left 07/14/2014   REVERSE SHOULDER ARTHROPLASTY Left 07/14/2014   Procedure: LEFT REVERSE SHOULDER ARTHROPLASTY;  Surgeon: Meredith Pel, MD;  Location: [facility];  Service: Orthopedics;  Laterality: Left;   RIGHT/LEFT HEART CATH AND CORONARY ANGIOGRAPHY N/A 01/29/2018   Procedure: RIGHT/LEFT HEART CATH AND CORONARY ANGIOGRAPHY;  Surgeon: Belva Crome, MD;  Location: [facility];  Service: Cardiovascular;  Laterality: N/A;   SHOULDER ARTHROSCOPY Left    TEE WITHOUT CARDIOVERSION N/A 03/03/2018   Procedure: TRANSESOPHAGEAL ECHOCARDIOGRAM (TEE);  Surgeon: Sherren Mocha, MD;  Location: [facility];  Service: Open Heart Surgery;  Laterality: N/A;   TOTAL SHOULDER REVISION Right 10/29/2016   Procedure: RIGHT REVERSE TOTAL SHOULDER;  Surgeon: Meredith Pel, MD;  Location: [facility];  Service: Orthopedics;  Laterality: Right;   TRANSCATHETER AORTIC VALVE REPLACEMENT, TRANSFEMORAL N/A 03/03/2018   Procedure: TRANSCATHETER AORTIC VALVE REPLACEMENT, TRANSFEMORAL;  Surgeon: Sherren Mocha, MD;  Location: [facility];  Service: Open Heart Surgery;  Laterality: N/A;   Social History   Occupational History   Not on file  Tobacco Use   Smoking status: Never Smoker   Smokeless tobacco: Never Used  Vaping Use   Vaping Use: Never used  Substance and Sexual Activity   Alcohol use: No   Drug use: No   Sexual activity: Not on file

## 2020-04-06 DIAGNOSIS — H35373 Puckering of macula, bilateral: Secondary | ICD-10-CM | POA: Diagnosis not present

## 2020-04-06 DIAGNOSIS — E113593 Type 2 diabetes mellitus with proliferative diabetic retinopathy without macular edema, bilateral: Secondary | ICD-10-CM | POA: Diagnosis not present

## 2020-04-06 DIAGNOSIS — H47011 Ischemic optic neuropathy, right eye: Secondary | ICD-10-CM | POA: Diagnosis not present

## 2020-04-06 DIAGNOSIS — H04123 Dry eye syndrome of bilateral lacrimal glands: Secondary | ICD-10-CM | POA: Diagnosis not present

## 2020-04-06 DIAGNOSIS — H35033 Hypertensive retinopathy, bilateral: Secondary | ICD-10-CM | POA: Diagnosis not present

## 2020-04-21 ENCOUNTER — Ambulatory Visit: Payer: Self-pay

## 2020-04-21 ENCOUNTER — Encounter: Payer: Self-pay | Admitting: Orthopedic Surgery

## 2020-04-21 ENCOUNTER — Ambulatory Visit: Payer: Medicare Other | Admitting: Orthopedic Surgery

## 2020-04-21 DIAGNOSIS — S42021A Displaced fracture of shaft of right clavicle, initial encounter for closed fracture: Secondary | ICD-10-CM | POA: Diagnosis not present

## 2020-04-21 NOTE — Progress Notes (Signed)
Fracture Visit Note   [name]           Date of Birth: 03/15/1934           MRN: [id number] Visit Date: 04/21/2020 PCP: Merrilee Seashore, MD   Assessment & Plan:  Chief Complaint: No chief complaint on file.  Visit Diagnoses:  1. Closed displaced fracture of shaft of right clavicle, initial encounter     Plan: Patient is an 86 year old female who presents for follow-up of right clavicle fracture.  She states that she actually sustained the injury on 03/20/2020.  She is about 4.5 weeks out from injury.  She notes pain is steadily improving.  Taking Tylenol for pain control.  She is able to sleep through the night some nights without any pain throughout.  She also has history of right reverse shoulder arthroplasty.  On exam, axillary nerve is intact with deltoid firing.  She is able to passively externally rotate to 50 degrees, abduction to 80 degrees, forward flex to 140 degrees.  Her active range of motion is equivalent to her passive range of motion.  5 -/5 subscapularis strength.  Mild tenderness to palpation over the fracture site.  There is a slight deformity but no skin tenting.  The more superior fracture fragment is slightly compressible but only by a couple millimeters.  Radiographs show continued displaced clavicle fracture.  No callus formation just yet.  Reverse shoulder prosthesis remains in similar position to previous radiographs.  Follow-up in 4 weeks for clinical recheck.  Okay to discontinue sling and avoid heavy lifting.  Patient agrees with plan.  Follow-Up Instructions: No follow-ups on file.   Orders:  Orders Placed This Encounter  Procedures  . XR Clavicle Right   No orders of the defined types were placed in this encounter.   Imaging: No results found.  PMFS History: Patient Active Problem List   Diagnosis Date Noted  . Dyspnea on exertion 03/23/2018  . Other secondary pulmonary hypertension ([facility]) 03/23/2018  . S/P TAVR (transcatheter  aortic valve replacement) 03/03/2018  . Diabetes mellitus without complication ([facility])   . Hypertension   . Hypothyroidism   . Anterior ischemic optic neuropathy of right eye 05/28/2017  . Snoring 05/28/2017  . OSA (obstructive sleep apnea) 05/28/2017  . Shoulder arthritis 10/29/2016  . Severe aortic stenosis 06/21/2016  . Lightheaded 06/05/2016  . Poor balance 06/05/2016  . Sensorineural hearing loss (SNHL), bilateral 02/02/2016  . Arthritis of shoulder region, left, degenerative 07/14/2014   Past Medical History:  Diagnosis Date  . Anemia   . Arthritis   . Cancer ([facility]) 1989   melanoma  . Diabetes mellitus without complication ([facility])   . Dizziness   . GERD (gastroesophageal reflux disease)   . History of hiatal hernia   . Hypertension   . Hypothyroidism   . IBS (irritable bowel syndrome)   . S/P TAVR (transcatheter aortic valve replacement) 03/03/2018   s/p 23 mm Edwards Sapien THV via the TF approach  . Severe aortic stenosis     Family History  Problem Relation Age of Onset  . Hypertension Mother   . Hypertension Father   . Heart disease Father   . Heart disease Brother     Past Surgical History:  Procedure Laterality Date  . APPENDECTOMY     86 yrs old  . CARPAL TUNNEL RELEASE Right 10/1999   Myerdierks, MD  . CHOLECYSTECTOMY  95  . COLONOSCOPY    . EYE SURGERY Bilateral 2017  cataract surgery with lens implants  . JOINT REPLACEMENT Right    knee   . REVERSE SHOULDER ARTHROPLASTY Left 07/14/2014  . REVERSE SHOULDER ARTHROPLASTY Left 07/14/2014   Procedure: LEFT REVERSE SHOULDER ARTHROPLASTY;  Surgeon: Meredith Pel, MD;  Location: [facility];  Service: Orthopedics;  Laterality: Left;  . RIGHT/LEFT HEART CATH AND CORONARY ANGIOGRAPHY N/A 01/29/2018   Procedure: RIGHT/LEFT HEART CATH AND CORONARY ANGIOGRAPHY;  Surgeon: Belva Crome, MD;  Location: [facility];  Service: Cardiovascular;  Laterality: N/A;  . SHOULDER ARTHROSCOPY Left   . TEE WITHOUT  CARDIOVERSION N/A 03/03/2018   Procedure: TRANSESOPHAGEAL ECHOCARDIOGRAM (TEE);  Surgeon: Sherren Mocha, MD;  Location: [facility];  Service: Open Heart Surgery;  Laterality: N/A;  . TOTAL SHOULDER REVISION Right 10/29/2016   Procedure: RIGHT REVERSE TOTAL SHOULDER;  Surgeon: Meredith Pel, MD;  Location: [facility];  Service: Orthopedics;  Laterality: Right;  . TRANSCATHETER AORTIC VALVE REPLACEMENT, TRANSFEMORAL N/A 03/03/2018   Procedure: TRANSCATHETER AORTIC VALVE REPLACEMENT, TRANSFEMORAL;  Surgeon: Sherren Mocha, MD;  Location: [facility];  Service: Open Heart Surgery;  Laterality: N/A;   Social History   Occupational History  . Not on file  Tobacco Use  . Smoking status: Never Smoker  . Smokeless tobacco: Never Used  Vaping Use  . Vaping Use: Never used  Substance and Sexual Activity  . Alcohol use: No  . Drug use: No  . Sexual activity: Not on file

## 2020-04-27 DIAGNOSIS — I129 Hypertensive chronic kidney disease with stage 1 through stage 4 chronic kidney disease, or unspecified chronic kidney disease: Secondary | ICD-10-CM | POA: Diagnosis not present

## 2020-04-27 DIAGNOSIS — N1831 Chronic kidney disease, stage 3a: Secondary | ICD-10-CM | POA: Diagnosis not present

## 2020-04-27 DIAGNOSIS — E1122 Type 2 diabetes mellitus with diabetic chronic kidney disease: Secondary | ICD-10-CM | POA: Diagnosis not present

## 2020-05-11 DIAGNOSIS — E039 Hypothyroidism, unspecified: Secondary | ICD-10-CM | POA: Diagnosis not present

## 2020-05-11 DIAGNOSIS — H35039 Hypertensive retinopathy, unspecified eye: Secondary | ICD-10-CM | POA: Diagnosis not present

## 2020-05-11 DIAGNOSIS — I10 Essential (primary) hypertension: Secondary | ICD-10-CM | POA: Diagnosis not present

## 2020-05-11 DIAGNOSIS — E782 Mixed hyperlipidemia: Secondary | ICD-10-CM | POA: Diagnosis not present

## 2020-05-11 DIAGNOSIS — E1165 Type 2 diabetes mellitus with hyperglycemia: Secondary | ICD-10-CM | POA: Diagnosis not present

## 2020-05-17 ENCOUNTER — Ambulatory Visit: Payer: Medicare Other | Admitting: Orthopedic Surgery

## 2020-05-17 ENCOUNTER — Ambulatory Visit (INDEPENDENT_AMBULATORY_CARE_PROVIDER_SITE_OTHER): Payer: Medicare Other

## 2020-05-17 ENCOUNTER — Encounter: Payer: Self-pay | Admitting: Orthopedic Surgery

## 2020-05-17 DIAGNOSIS — S42021A Displaced fracture of shaft of right clavicle, initial encounter for closed fracture: Secondary | ICD-10-CM | POA: Diagnosis not present

## 2020-05-17 NOTE — Progress Notes (Signed)
Post-Op Visit Note   [name]           Date of Birth: 11-Jul-1934           MRN: [id number] Visit Date: 05/17/2020 PCP: Merrilee Seashore, MD   Assessment & Plan:  Chief Complaint:  Chief Complaint  Patient presents with  . Right Shoulder - Fracture    4 week follow up, patient reports doing better    Visit Diagnoses:  1. Closed displaced fracture of shaft of right clavicle, initial encounter     Plan: Patient is a 86 year old female who returns following right clavicle fracture that was sustained on 03/20/2020.  She about 2 months out from injury.  Pain is continually improving and she does not have to take anything regularly for pain.  She has some pain that is worse in the morning upon getting up but she does not wake with pain or have any pain that interferes with her sleep in general.  She has passive motion of the shoulder with 50 degrees external rotation, abduction to 80 degrees, forward flexion to 130 degrees.  5 -/5 subscapularis strength.  Able to lift her arm equivalent to her passive motion.  She has no tenderness over the fracture site with a slight deformity but no skin tenting.  There is slight compressibility of the fracture but only by about 1 to 2 mm.  Radiographs show displaced right clavicle fracture with no increased displacement since prior radiographs.  There is a small amount of early callus formation that is noted off the superior aspect of the lateral fracture fragment.  Overall she is doing very well clinically and feels she is making continued improvement.  Plan for her to not lift anything more than 5 pounds over the next month and then she is okay for full activity.  Follow-up as needed if pain does not improve.  Follow-Up Instructions: No follow-ups on file.   Orders:  Orders Placed This Encounter  Procedures  . XR Clavicle Right   No orders of the defined types were placed in this encounter.   Imaging: No results found.  PMFS  History: Patient Active Problem List   Diagnosis Date Noted  . Dyspnea on exertion 03/23/2018  . Other secondary pulmonary hypertension ([facility]) 03/23/2018  . S/P TAVR (transcatheter aortic valve replacement) 03/03/2018  . Diabetes mellitus without complication ([facility])   . Hypertension   . Hypothyroidism   . Anterior ischemic optic neuropathy of right eye 05/28/2017  . Snoring 05/28/2017  . OSA (obstructive sleep apnea) 05/28/2017  . Shoulder arthritis 10/29/2016  . Severe aortic stenosis 06/21/2016  . Lightheaded 06/05/2016  . Poor balance 06/05/2016  . Sensorineural hearing loss (SNHL), bilateral 02/02/2016  . Arthritis of shoulder region, left, degenerative 07/14/2014   Past Medical History:  Diagnosis Date  . Anemia   . Arthritis   . Cancer ([facility]) 1989   melanoma  . Diabetes mellitus without complication ([facility])   . Dizziness   . GERD (gastroesophageal reflux disease)   . History of hiatal hernia   . Hypertension   . Hypothyroidism   . IBS (irritable bowel syndrome)   . S/P TAVR (transcatheter aortic valve replacement) 03/03/2018   s/p 23 mm Edwards Sapien THV via the TF approach  . Severe aortic stenosis     Family History  Problem Relation Age of Onset  . Hypertension Mother   . Hypertension Father   . Heart disease Father   . Heart disease Brother  Past Surgical History:  Procedure Laterality Date  . APPENDECTOMY     86 yrs old  . CARPAL TUNNEL RELEASE Right 10/1999   Myerdierks, MD  . CHOLECYSTECTOMY  95  . COLONOSCOPY    . EYE SURGERY Bilateral 2017   cataract surgery with lens implants  . JOINT REPLACEMENT Right    knee   . REVERSE SHOULDER ARTHROPLASTY Left 07/14/2014  . REVERSE SHOULDER ARTHROPLASTY Left 07/14/2014   Procedure: LEFT REVERSE SHOULDER ARTHROPLASTY;  Surgeon: Meredith Pel, MD;  Location: [facility];  Service: Orthopedics;  Laterality: Left;  . RIGHT/LEFT HEART CATH AND CORONARY ANGIOGRAPHY N/A 01/29/2018   Procedure: RIGHT/LEFT HEART CATH  AND CORONARY ANGIOGRAPHY;  Surgeon: Belva Crome, MD;  Location: [facility];  Service: Cardiovascular;  Laterality: N/A;  . SHOULDER ARTHROSCOPY Left   . TEE WITHOUT CARDIOVERSION N/A 03/03/2018   Procedure: TRANSESOPHAGEAL ECHOCARDIOGRAM (TEE);  Surgeon: Sherren Mocha, MD;  Location: [facility];  Service: Open Heart Surgery;  Laterality: N/A;  . TOTAL SHOULDER REVISION Right 10/29/2016   Procedure: RIGHT REVERSE TOTAL SHOULDER;  Surgeon: Meredith Pel, MD;  Location: [facility];  Service: Orthopedics;  Laterality: Right;  . TRANSCATHETER AORTIC VALVE REPLACEMENT, TRANSFEMORAL N/A 03/03/2018   Procedure: TRANSCATHETER AORTIC VALVE REPLACEMENT, TRANSFEMORAL;  Surgeon: Sherren Mocha, MD;  Location: [facility];  Service: Open Heart Surgery;  Laterality: N/A;   Social History   Occupational History  . Not on file  Tobacco Use  . Smoking status: Never Smoker  . Smokeless tobacco: Never Used  Vaping Use  . Vaping Use: Never used  Substance and Sexual Activity  . Alcohol use: No  . Drug use: No  . Sexual activity: Not on file

## 2020-05-18 DIAGNOSIS — H35039 Hypertensive retinopathy, unspecified eye: Secondary | ICD-10-CM | POA: Diagnosis not present

## 2020-05-18 DIAGNOSIS — D692 Other nonthrombocytopenic purpura: Secondary | ICD-10-CM | POA: Diagnosis not present

## 2020-05-18 DIAGNOSIS — I10 Essential (primary) hypertension: Secondary | ICD-10-CM | POA: Diagnosis not present

## 2020-05-18 DIAGNOSIS — E113293 Type 2 diabetes mellitus with mild nonproliferative diabetic retinopathy without macular edema, bilateral: Secondary | ICD-10-CM | POA: Diagnosis not present

## 2020-05-18 DIAGNOSIS — E039 Hypothyroidism, unspecified: Secondary | ICD-10-CM | POA: Diagnosis not present

## 2020-05-18 DIAGNOSIS — Z0000 Encounter for general adult medical examination without abnormal findings: Secondary | ICD-10-CM | POA: Diagnosis not present

## 2020-05-18 DIAGNOSIS — I700 Atherosclerosis of aorta: Secondary | ICD-10-CM | POA: Diagnosis not present

## 2020-05-18 DIAGNOSIS — E782 Mixed hyperlipidemia: Secondary | ICD-10-CM | POA: Diagnosis not present

## 2020-05-18 DIAGNOSIS — N1831 Chronic kidney disease, stage 3a: Secondary | ICD-10-CM | POA: Diagnosis not present

## 2020-05-18 DIAGNOSIS — E1165 Type 2 diabetes mellitus with hyperglycemia: Secondary | ICD-10-CM | POA: Diagnosis not present

## 2020-05-19 ENCOUNTER — Ambulatory Visit: Payer: Medicare Other | Admitting: Orthopedic Surgery

## 2020-05-22 ENCOUNTER — Telehealth: Payer: Self-pay

## 2020-05-22 NOTE — Telephone Encounter (Signed)
Pt had last inj on 02/07/20 for Bil L4-L5, L5-S1 TF. Pt state the inj help sum. Please advise

## 2020-05-22 NOTE — Telephone Encounter (Signed)
Pt called and would like to set up an appt for Dr. Ernestina Patches.  She said for the first week in May would be great.  Pt stated you can LVM on her phone if she doesn't answer

## 2020-05-23 NOTE — Telephone Encounter (Signed)
Called pt and sch for repeat 5/10

## 2020-06-06 ENCOUNTER — Encounter: Payer: Self-pay | Admitting: Physical Medicine and Rehabilitation

## 2020-06-06 ENCOUNTER — Other Ambulatory Visit: Payer: Self-pay

## 2020-06-06 ENCOUNTER — Ambulatory Visit: Payer: Medicare Other | Admitting: Physical Medicine and Rehabilitation

## 2020-06-06 ENCOUNTER — Ambulatory Visit: Payer: Self-pay

## 2020-06-06 VITALS — BP 129/75 | HR 78

## 2020-06-06 DIAGNOSIS — M5416 Radiculopathy, lumbar region: Secondary | ICD-10-CM | POA: Diagnosis not present

## 2020-06-06 MED ORDER — METHYLPREDNISOLONE ACETATE 80 MG/ML IJ SUSP
80.0000 mg | Freq: Once | INTRAMUSCULAR | Status: AC
  Administered 2020-06-06: 80 mg

## 2020-06-06 NOTE — Patient Instructions (Signed)

## 2020-06-06 NOTE — Progress Notes (Signed)
Numeric Pain Rating Scale and Functional Assessment Average Pain 8   In the last MONTH (on 0-10 scale) has pain interfered with the following?  1. General activity like being  able to carry out your everyday physical activities such as walking, climbing stairs, carrying groceries, or moving a chair?  Rating(8)   +Driver, -BT, -Dye Allergies.   She still has the pain on both sides, walking, standing to long hurts it, pain from knees down when she sits to long or when she gets up in the mornings

## 2020-06-06 NOTE — Progress Notes (Signed)
Megan [name] - 86 y.o. female MRN [id number]  Date of birth: 08/08/1934  Office Visit Note: Visit Date: 06/06/2020 PCP: Merrilee Seashore, MD Referred by: Merrilee Seashore, MD  Subjective: Chief Complaint  Patient presents with   Lower Back - Pain   HPI:  Megan [name] is a 86 y.o. female who comes in today for planned repeat Left L4-L5 and Right L5-S1  Lumbar Transforaminal epidural steroid injection with fluoroscopic guidance.  The patient has failed conservative care including home exercise, medications, time and activity modification.  This injection will be diagnostic and hopefully therapeutic.  Please see requesting physician notes for further details and justification. Patient received more than 50% pain relief from prior injection.   Referring: Dr. Basil Dess   ROS Otherwise per HPI.  Assessment & Plan: Visit Diagnoses:    ICD-10-CM   1. Lumbar radiculopathy  M54.16 XR C-ARM NO REPORT    Epidural Steroid injection    methylPREDNISolone acetate (DEPO-MEDROL) injection 80 mg      Plan: No additional findings.   Meds & Orders:  Meds ordered this encounter  Medications   methylPREDNISolone acetate (DEPO-MEDROL) injection 80 mg    Orders Placed This Encounter  Procedures   XR C-ARM NO REPORT   Epidural Steroid injection    Follow-up: Return if symptoms worsen or fail to improve.   Procedures: No procedures performed  Lumbosacral Transforaminal Epidural Steroid Injection - Sub-Pedicular Approach with Fluoroscopic Guidance  [name]      Date of Birth: May 07, 1934 MRN: [id number] PCP: Merrilee Seashore, MD      Visit Date: 06/06/2020   Universal Protocol:    Date/Time: 06/06/2020  Consent Given By: the patient  Position: PRONE  Additional Comments: Vital signs were monitored before and after the procedure. Patient was prepped and draped in the usual sterile fashion. The correct patient, procedure, and site was verified.   Injection  Procedure Details:   Procedure diagnoses: Lumbar radiculopathy [M54.16]    Meds Administered:  Meds ordered this encounter  Medications   methylPREDNISolone acetate (DEPO-MEDROL) injection 80 mg    Laterality: Left and Right  Location/Site:  L4-L5, L5-S1  Needle:5.0 in., 22 ga.  Short bevel or Quincke spinal needle  Needle Placement: Transforaminal  Findings:    -Comments: Excellent flow of contrast along the nerve, nerve root and into the epidural space.  Procedure Details: After squaring off the end-plates to get a true AP view, the C-arm was positioned so that an oblique view of the foramen as noted above was visualized. The target area is just inferior to the "nose of the scotty dog" or sub pedicular. The soft tissues overlying this structure were infiltrated with 2-3 ml. of 1% Lidocaine without Epinephrine.  The spinal needle was inserted toward the target using a "trajectory" view along the fluoroscope beam.  Under AP and lateral visualization, the needle was advanced so it did not puncture dura and was located close the 6 O'Clock position of the pedical in AP tracterory. Biplanar projections were used to confirm position. Aspiration was confirmed to be negative for CSF and/or blood. A 1-2 ml. volume of Isovue-250 was injected and flow of contrast was noted at each level. Radiographs were obtained for documentation purposes.   After attaining the desired flow of contrast documented above, a 0.5 to 1.0 ml test dose of 0.25% Marcaine was injected into each respective transforaminal space.  The patient was observed for 90 seconds post injection.  After no sensory deficits  were reported, and normal lower extremity motor function was noted,   the above injectate was administered so that equal amounts of the injectate were placed at each foramen (level) into the transforaminal epidural space.   Additional Comments:  The patient tolerated the procedure well Dressing: 2 x 2 sterile  gauze and Band-Aid    Post-procedure details: Patient was observed during the procedure. Post-procedure instructions were reviewed.  Patient left the clinic in stable condition.    Clinical History: MRI LUMBAR SPINE WITHOUT CONTRAST   TECHNIQUE: Multiplanar, multisequence MR imaging of the lumbar spine was performed. No intravenous contrast was administered.   COMPARISON:  MRI lumbar spine 08/14/2004.   FINDINGS: Segmentation:  Standard.   Alignment: 0.6 cm anterolisthesis L4 on L5 due to facet arthropathy is new since the prior examination. Trace anterolisthesis L5 on S1 is unchanged.   Vertebrae:  No fracture or worrisome lesion.   Conus medullaris: Extends to the L1-2 level and appears normal.   Paraspinal and other soft tissues: Negative.   Disc levels:   T11-12 is imaged in the sagittal plane only. There is a minimal central protrusion without stenosis.   T12-L1:  Minimal right paracentral protrusion without stenosis.   L1-2:  Negative.   L2-3: Mild facet degenerative disease and a minimal disc bulge without central canal or foraminal stenosis.   L3-4: Moderate facet arthropathy, ligamentum flavum thickening and a shallow disc bulge. There is moderate central canal stenosis with narrowing in the subarticular recesses which could impact either descending L4 root. The foramina are open. Spondylosis has worsened since the prior MRI.   L4-5: Advanced facet degenerative disease and bulky ligamentum flavum thickening are seen. The disc is uncovered with a shallow bulge. There is severe central canal and bilateral subarticular recess narrowing. Mild left foraminal narrowing is seen. The right foramen is open. Spondylosis is much worse than on the prior MRI.   L5-S1: Advanced facet degenerative disease is worse on the left. There is a shallow broad-based central protrusion and ligamentum flavum thickening. Narrowing is seen in the subarticular  recesses. Spondylosis has worsened since the prior MRI.   IMPRESSION: Worsened spondylosis at L4-5 where there is severe central canal and bilateral subarticular recess narrowing due to bulky ligamentum flavum thickening and a shallow disc bulge. Advanced facet arthropathy at this level results in 0.6 cm anterolisthesis.   Worsened spondylosis at L3-4 where there is moderate central canal stenosis and narrowing in the subarticular recesses which could impact either descending L4 root.   Some progression spondylosis at L5-S1 where there is a shallow broad-based central protrusion and facet arthropathy resulting in narrowing in the subarticular recesses which could irritate either descending S1 root.     Electronically Signed   By: Inge Rise M.D.   On: 11/26/2016 12:04     Objective:  VS:  HT:    WT:   BMI:     BP:129/75  HR:78bpm  TEMP: ( )  RESP:95 % Physical Exam Vitals and nursing note reviewed.  Constitutional:      General: She is not in acute distress.    Appearance: Normal appearance. She is not ill-appearing.  HENT:     Head: Normocephalic and atraumatic.     Right Ear: External ear normal.     Left Ear: External ear normal.  Eyes:     Extraocular Movements: Extraocular movements intact.  Cardiovascular:     Rate and Rhythm: Normal rate.     Pulses: Normal pulses.  Pulmonary:  Effort: Pulmonary effort is normal. No respiratory distress.  Abdominal:     General: There is no distension.     Palpations: Abdomen is soft.  Musculoskeletal:        General: Tenderness present.     Cervical back: Neck supple.     Right lower leg: No edema.     Left lower leg: No edema.     Comments: Patient has good distal strength with no pain over the greater trochanters.  No clonus or focal weakness.  Skin:    Findings: No erythema, lesion or rash.  Neurological:     General: No focal deficit present.     Mental Status: She is alert and oriented to person,  place, and time.     Sensory: No sensory deficit.     Motor: No weakness or abnormal muscle tone.     Coordination: Coordination normal.  Psychiatric:        Mood and Affect: Mood normal.        Behavior: Behavior normal.     Imaging: No results found.

## 2020-07-03 DIAGNOSIS — Z952 Presence of prosthetic heart valve: Secondary | ICD-10-CM | POA: Diagnosis not present

## 2020-07-03 DIAGNOSIS — E559 Vitamin D deficiency, unspecified: Secondary | ICD-10-CM | POA: Diagnosis not present

## 2020-07-03 DIAGNOSIS — R42 Dizziness and giddiness: Secondary | ICD-10-CM | POA: Diagnosis not present

## 2020-07-03 DIAGNOSIS — Z9181 History of falling: Secondary | ICD-10-CM | POA: Diagnosis not present

## 2020-07-03 DIAGNOSIS — G3281 Cerebellar ataxia in diseases classified elsewhere: Secondary | ICD-10-CM | POA: Diagnosis not present

## 2020-07-03 DIAGNOSIS — K219 Gastro-esophageal reflux disease without esophagitis: Secondary | ICD-10-CM | POA: Diagnosis not present

## 2020-07-03 DIAGNOSIS — I272 Pulmonary hypertension, unspecified: Secondary | ICD-10-CM | POA: Diagnosis not present

## 2020-07-03 DIAGNOSIS — Z743 Need for continuous supervision: Secondary | ICD-10-CM | POA: Diagnosis not present

## 2020-07-03 DIAGNOSIS — R278 Other lack of coordination: Secondary | ICD-10-CM | POA: Diagnosis not present

## 2020-07-03 DIAGNOSIS — R52 Pain, unspecified: Secondary | ICD-10-CM | POA: Diagnosis not present

## 2020-07-03 DIAGNOSIS — E119 Type 2 diabetes mellitus without complications: Secondary | ICD-10-CM | POA: Diagnosis not present

## 2020-07-03 DIAGNOSIS — Z4789 Encounter for other orthopedic aftercare: Secondary | ICD-10-CM | POA: Diagnosis not present

## 2020-07-03 DIAGNOSIS — W1839XA Other fall on same level, initial encounter: Secondary | ICD-10-CM | POA: Diagnosis not present

## 2020-07-03 DIAGNOSIS — R338 Other retention of urine: Secondary | ICD-10-CM | POA: Diagnosis not present

## 2020-07-03 DIAGNOSIS — I119 Hypertensive heart disease without heart failure: Secondary | ICD-10-CM | POA: Diagnosis not present

## 2020-07-03 DIAGNOSIS — R339 Retention of urine, unspecified: Secondary | ICD-10-CM | POA: Diagnosis not present

## 2020-07-03 DIAGNOSIS — R1312 Dysphagia, oropharyngeal phase: Secondary | ICD-10-CM | POA: Diagnosis not present

## 2020-07-03 DIAGNOSIS — Z7984 Long term (current) use of oral hypoglycemic drugs: Secondary | ICD-10-CM | POA: Diagnosis not present

## 2020-07-03 DIAGNOSIS — Z01818 Encounter for other preprocedural examination: Secondary | ICD-10-CM | POA: Diagnosis not present

## 2020-07-03 DIAGNOSIS — S72142A Displaced intertrochanteric fracture of left femur, initial encounter for closed fracture: Secondary | ICD-10-CM | POA: Diagnosis not present

## 2020-07-03 DIAGNOSIS — I10 Essential (primary) hypertension: Secondary | ICD-10-CM | POA: Diagnosis not present

## 2020-07-03 DIAGNOSIS — Z954 Presence of other heart-valve replacement: Secondary | ICD-10-CM | POA: Diagnosis not present

## 2020-07-03 DIAGNOSIS — I350 Nonrheumatic aortic (valve) stenosis: Secondary | ICD-10-CM | POA: Diagnosis not present

## 2020-07-03 DIAGNOSIS — W19XXXA Unspecified fall, initial encounter: Secondary | ICD-10-CM | POA: Diagnosis not present

## 2020-07-03 DIAGNOSIS — I4510 Unspecified right bundle-branch block: Secondary | ICD-10-CM | POA: Diagnosis not present

## 2020-07-03 DIAGNOSIS — Z8249 Family history of ischemic heart disease and other diseases of the circulatory system: Secondary | ICD-10-CM | POA: Diagnosis not present

## 2020-07-03 DIAGNOSIS — E58 Dietary calcium deficiency: Secondary | ICD-10-CM | POA: Diagnosis not present

## 2020-07-03 DIAGNOSIS — Y998 Other external cause status: Secondary | ICD-10-CM | POA: Diagnosis not present

## 2020-07-03 DIAGNOSIS — I491 Atrial premature depolarization: Secondary | ICD-10-CM | POA: Diagnosis not present

## 2020-07-03 DIAGNOSIS — E039 Hypothyroidism, unspecified: Secondary | ICD-10-CM | POA: Diagnosis not present

## 2020-07-03 DIAGNOSIS — R2681 Unsteadiness on feet: Secondary | ICD-10-CM | POA: Diagnosis not present

## 2020-07-03 DIAGNOSIS — I080 Rheumatic disorders of both mitral and aortic valves: Secondary | ICD-10-CM | POA: Diagnosis not present

## 2020-07-03 DIAGNOSIS — M810 Age-related osteoporosis without current pathological fracture: Secondary | ICD-10-CM | POA: Diagnosis not present

## 2020-07-03 DIAGNOSIS — S72002A Fracture of unspecified part of neck of left femur, initial encounter for closed fracture: Secondary | ICD-10-CM | POA: Diagnosis not present

## 2020-07-03 DIAGNOSIS — S72142D Displaced intertrochanteric fracture of left femur, subsequent encounter for closed fracture with routine healing: Secondary | ICD-10-CM | POA: Diagnosis not present

## 2020-07-03 DIAGNOSIS — I493 Ventricular premature depolarization: Secondary | ICD-10-CM | POA: Diagnosis not present

## 2020-07-03 DIAGNOSIS — R0902 Hypoxemia: Secondary | ICD-10-CM | POA: Diagnosis not present

## 2020-07-03 DIAGNOSIS — Z79899 Other long term (current) drug therapy: Secondary | ICD-10-CM | POA: Diagnosis not present

## 2020-07-03 DIAGNOSIS — M25552 Pain in left hip: Secondary | ICD-10-CM | POA: Diagnosis not present

## 2020-07-03 DIAGNOSIS — M6281 Muscle weakness (generalized): Secondary | ICD-10-CM | POA: Diagnosis not present

## 2020-07-06 DIAGNOSIS — R339 Retention of urine, unspecified: Secondary | ICD-10-CM | POA: Diagnosis not present

## 2020-07-06 DIAGNOSIS — M1990 Unspecified osteoarthritis, unspecified site: Secondary | ICD-10-CM | POA: Diagnosis not present

## 2020-07-06 DIAGNOSIS — S7292XA Unspecified fracture of left femur, initial encounter for closed fracture: Secondary | ICD-10-CM | POA: Diagnosis not present

## 2020-07-06 DIAGNOSIS — D649 Anemia, unspecified: Secondary | ICD-10-CM | POA: Diagnosis not present

## 2020-07-06 DIAGNOSIS — R278 Other lack of coordination: Secondary | ICD-10-CM | POA: Diagnosis not present

## 2020-07-06 DIAGNOSIS — R1312 Dysphagia, oropharyngeal phase: Secondary | ICD-10-CM | POA: Diagnosis not present

## 2020-07-06 DIAGNOSIS — R0902 Hypoxemia: Secondary | ICD-10-CM | POA: Diagnosis not present

## 2020-07-06 DIAGNOSIS — E58 Dietary calcium deficiency: Secondary | ICD-10-CM | POA: Diagnosis not present

## 2020-07-06 DIAGNOSIS — S72142D Displaced intertrochanteric fracture of left femur, subsequent encounter for closed fracture with routine healing: Secondary | ICD-10-CM | POA: Diagnosis not present

## 2020-07-06 DIAGNOSIS — Z4789 Encounter for other orthopedic aftercare: Secondary | ICD-10-CM | POA: Diagnosis not present

## 2020-07-06 DIAGNOSIS — G3281 Cerebellar ataxia in diseases classified elsewhere: Secondary | ICD-10-CM | POA: Diagnosis not present

## 2020-07-06 DIAGNOSIS — K5900 Constipation, unspecified: Secondary | ICD-10-CM | POA: Diagnosis not present

## 2020-07-06 DIAGNOSIS — I350 Nonrheumatic aortic (valve) stenosis: Secondary | ICD-10-CM | POA: Diagnosis not present

## 2020-07-06 DIAGNOSIS — E039 Hypothyroidism, unspecified: Secondary | ICD-10-CM | POA: Diagnosis not present

## 2020-07-06 DIAGNOSIS — R2681 Unsteadiness on feet: Secondary | ICD-10-CM | POA: Diagnosis not present

## 2020-07-06 DIAGNOSIS — W19XXXA Unspecified fall, initial encounter: Secondary | ICD-10-CM | POA: Diagnosis not present

## 2020-07-06 DIAGNOSIS — Z743 Need for continuous supervision: Secondary | ICD-10-CM | POA: Diagnosis not present

## 2020-07-06 DIAGNOSIS — R42 Dizziness and giddiness: Secondary | ICD-10-CM | POA: Diagnosis not present

## 2020-07-06 DIAGNOSIS — E119 Type 2 diabetes mellitus without complications: Secondary | ICD-10-CM | POA: Diagnosis not present

## 2020-07-06 DIAGNOSIS — M6281 Muscle weakness (generalized): Secondary | ICD-10-CM | POA: Diagnosis not present

## 2020-07-06 DIAGNOSIS — Z9181 History of falling: Secondary | ICD-10-CM | POA: Diagnosis not present

## 2020-07-06 DIAGNOSIS — I10 Essential (primary) hypertension: Secondary | ICD-10-CM | POA: Diagnosis not present

## 2020-07-06 DIAGNOSIS — E559 Vitamin D deficiency, unspecified: Secondary | ICD-10-CM | POA: Diagnosis not present

## 2020-07-06 DIAGNOSIS — S72142A Displaced intertrochanteric fracture of left femur, initial encounter for closed fracture: Secondary | ICD-10-CM | POA: Diagnosis not present

## 2020-07-06 DIAGNOSIS — R269 Unspecified abnormalities of gait and mobility: Secondary | ICD-10-CM | POA: Diagnosis not present

## 2020-07-10 DIAGNOSIS — S7292XA Unspecified fracture of left femur, initial encounter for closed fracture: Secondary | ICD-10-CM | POA: Diagnosis not present

## 2020-07-10 DIAGNOSIS — E039 Hypothyroidism, unspecified: Secondary | ICD-10-CM | POA: Diagnosis not present

## 2020-07-10 DIAGNOSIS — I10 Essential (primary) hypertension: Secondary | ICD-10-CM | POA: Diagnosis not present

## 2020-07-10 DIAGNOSIS — D649 Anemia, unspecified: Secondary | ICD-10-CM | POA: Diagnosis not present

## 2020-07-10 DIAGNOSIS — M1990 Unspecified osteoarthritis, unspecified site: Secondary | ICD-10-CM | POA: Diagnosis not present

## 2020-07-12 DIAGNOSIS — E039 Hypothyroidism, unspecified: Secondary | ICD-10-CM | POA: Diagnosis not present

## 2020-07-12 DIAGNOSIS — M1990 Unspecified osteoarthritis, unspecified site: Secondary | ICD-10-CM | POA: Diagnosis not present

## 2020-07-12 DIAGNOSIS — I10 Essential (primary) hypertension: Secondary | ICD-10-CM | POA: Diagnosis not present

## 2020-07-12 DIAGNOSIS — K5900 Constipation, unspecified: Secondary | ICD-10-CM | POA: Diagnosis not present

## 2020-07-19 DIAGNOSIS — R269 Unspecified abnormalities of gait and mobility: Secondary | ICD-10-CM | POA: Diagnosis not present

## 2020-07-19 DIAGNOSIS — K5900 Constipation, unspecified: Secondary | ICD-10-CM | POA: Diagnosis not present

## 2020-07-19 DIAGNOSIS — M1990 Unspecified osteoarthritis, unspecified site: Secondary | ICD-10-CM | POA: Diagnosis not present

## 2020-07-19 DIAGNOSIS — Z4789 Encounter for other orthopedic aftercare: Secondary | ICD-10-CM | POA: Diagnosis not present

## 2020-07-19 DIAGNOSIS — I10 Essential (primary) hypertension: Secondary | ICD-10-CM | POA: Diagnosis not present

## 2020-07-19 DIAGNOSIS — E039 Hypothyroidism, unspecified: Secondary | ICD-10-CM | POA: Diagnosis not present

## 2020-07-20 DIAGNOSIS — Z4789 Encounter for other orthopedic aftercare: Secondary | ICD-10-CM | POA: Diagnosis not present

## 2020-07-20 DIAGNOSIS — S72142D Displaced intertrochanteric fracture of left femur, subsequent encounter for closed fracture with routine healing: Secondary | ICD-10-CM | POA: Diagnosis not present

## 2020-07-20 DIAGNOSIS — G3281 Cerebellar ataxia in diseases classified elsewhere: Secondary | ICD-10-CM | POA: Diagnosis not present

## 2020-07-22 NOTE — Procedures (Signed)
Lumbosacral Transforaminal Epidural Steroid Injection - Sub-Pedicular Approach with Fluoroscopic Guidance  [name]      Date of Birth: 10-Mar-1934 MRN: [id number] PCP: Merrilee Seashore, MD      Visit Date: 06/06/2020   Universal Protocol:    Date/Time: 06/06/2020  Consent Given By: the patient  Position: PRONE  Additional Comments: Vital signs were monitored before and after the procedure. Patient was prepped and draped in the usual sterile fashion. The correct patient, procedure, and site was verified.   Injection Procedure Details:   Procedure diagnoses: Lumbar radiculopathy [M54.16]    Meds Administered:  Meds ordered this encounter  Medications   methylPREDNISolone acetate (DEPO-MEDROL) injection 80 mg    Laterality: Left and Right  Location/Site:  L4-L5, L5-S1  Needle:5.0 in., 22 ga.  Short bevel or Quincke spinal needle  Needle Placement: Transforaminal  Findings:    -Comments: Excellent flow of contrast along the nerve, nerve root and into the epidural space.  Procedure Details: After squaring off the end-plates to get a true AP view, the C-arm was positioned so that an oblique view of the foramen as noted above was visualized. The target area is just inferior to the "nose of the scotty dog" or sub pedicular. The soft tissues overlying this structure were infiltrated with 2-3 ml. of 1% Lidocaine without Epinephrine.  The spinal needle was inserted toward the target using a "trajectory" view along the fluoroscope beam.  Under AP and lateral visualization, the needle was advanced so it did not puncture dura and was located close the 6 O'Clock position of the pedical in AP tracterory. Biplanar projections were used to confirm position. Aspiration was confirmed to be negative for CSF and/or blood. A 1-2 ml. volume of Isovue-250 was injected and flow of contrast was noted at each level. Radiographs were obtained for documentation purposes.   After  attaining the desired flow of contrast documented above, a 0.5 to 1.0 ml test dose of 0.25% Marcaine was injected into each respective transforaminal space.  The patient was observed for 90 seconds post injection.  After no sensory deficits were reported, and normal lower extremity motor function was noted,   the above injectate was administered so that equal amounts of the injectate were placed at each foramen (level) into the transforaminal epidural space.   Additional Comments:  The patient tolerated the procedure well Dressing: 2 x 2 sterile gauze and Band-Aid    Post-procedure details: Patient was observed during the procedure. Post-procedure instructions were reviewed.  Patient left the clinic in stable condition.

## 2020-08-04 DIAGNOSIS — Z7984 Long term (current) use of oral hypoglycemic drugs: Secondary | ICD-10-CM | POA: Diagnosis not present

## 2020-08-04 DIAGNOSIS — M1990 Unspecified osteoarthritis, unspecified site: Secondary | ICD-10-CM | POA: Diagnosis not present

## 2020-08-04 DIAGNOSIS — Z7982 Long term (current) use of aspirin: Secondary | ICD-10-CM | POA: Diagnosis not present

## 2020-08-04 DIAGNOSIS — M6281 Muscle weakness (generalized): Secondary | ICD-10-CM | POA: Diagnosis not present

## 2020-08-04 DIAGNOSIS — I10 Essential (primary) hypertension: Secondary | ICD-10-CM | POA: Diagnosis not present

## 2020-08-04 DIAGNOSIS — E785 Hyperlipidemia, unspecified: Secondary | ICD-10-CM | POA: Diagnosis not present

## 2020-08-04 DIAGNOSIS — E1169 Type 2 diabetes mellitus with other specified complication: Secondary | ICD-10-CM | POA: Diagnosis not present

## 2020-08-04 DIAGNOSIS — E039 Hypothyroidism, unspecified: Secondary | ICD-10-CM | POA: Diagnosis not present

## 2020-08-04 DIAGNOSIS — L89311 Pressure ulcer of right buttock, stage 1: Secondary | ICD-10-CM | POA: Diagnosis not present

## 2020-08-04 DIAGNOSIS — Z9181 History of falling: Secondary | ICD-10-CM | POA: Diagnosis not present

## 2020-08-04 DIAGNOSIS — R2689 Other abnormalities of gait and mobility: Secondary | ICD-10-CM | POA: Diagnosis not present

## 2020-08-04 DIAGNOSIS — Z9049 Acquired absence of other specified parts of digestive tract: Secondary | ICD-10-CM | POA: Diagnosis not present

## 2020-08-04 DIAGNOSIS — S72142D Displaced intertrochanteric fracture of left femur, subsequent encounter for closed fracture with routine healing: Secondary | ICD-10-CM | POA: Diagnosis not present

## 2020-08-04 DIAGNOSIS — Z9089 Acquired absence of other organs: Secondary | ICD-10-CM | POA: Diagnosis not present

## 2020-08-04 DIAGNOSIS — K219 Gastro-esophageal reflux disease without esophagitis: Secondary | ICD-10-CM | POA: Diagnosis not present

## 2020-08-07 DIAGNOSIS — M1990 Unspecified osteoarthritis, unspecified site: Secondary | ICD-10-CM | POA: Diagnosis not present

## 2020-08-07 DIAGNOSIS — E039 Hypothyroidism, unspecified: Secondary | ICD-10-CM | POA: Diagnosis not present

## 2020-08-07 DIAGNOSIS — Z7982 Long term (current) use of aspirin: Secondary | ICD-10-CM | POA: Diagnosis not present

## 2020-08-07 DIAGNOSIS — R2689 Other abnormalities of gait and mobility: Secondary | ICD-10-CM | POA: Diagnosis not present

## 2020-08-07 DIAGNOSIS — Z9049 Acquired absence of other specified parts of digestive tract: Secondary | ICD-10-CM | POA: Diagnosis not present

## 2020-08-07 DIAGNOSIS — M6281 Muscle weakness (generalized): Secondary | ICD-10-CM | POA: Diagnosis not present

## 2020-08-07 DIAGNOSIS — Z9181 History of falling: Secondary | ICD-10-CM | POA: Diagnosis not present

## 2020-08-07 DIAGNOSIS — K219 Gastro-esophageal reflux disease without esophagitis: Secondary | ICD-10-CM | POA: Diagnosis not present

## 2020-08-07 DIAGNOSIS — S72142D Displaced intertrochanteric fracture of left femur, subsequent encounter for closed fracture with routine healing: Secondary | ICD-10-CM | POA: Diagnosis not present

## 2020-08-07 DIAGNOSIS — E785 Hyperlipidemia, unspecified: Secondary | ICD-10-CM | POA: Diagnosis not present

## 2020-08-07 DIAGNOSIS — Z7984 Long term (current) use of oral hypoglycemic drugs: Secondary | ICD-10-CM | POA: Diagnosis not present

## 2020-08-07 DIAGNOSIS — I10 Essential (primary) hypertension: Secondary | ICD-10-CM | POA: Diagnosis not present

## 2020-08-07 DIAGNOSIS — Z9089 Acquired absence of other organs: Secondary | ICD-10-CM | POA: Diagnosis not present

## 2020-08-07 DIAGNOSIS — L89311 Pressure ulcer of right buttock, stage 1: Secondary | ICD-10-CM | POA: Diagnosis not present

## 2020-08-07 DIAGNOSIS — E1169 Type 2 diabetes mellitus with other specified complication: Secondary | ICD-10-CM | POA: Diagnosis not present

## 2020-08-08 DIAGNOSIS — Z9089 Acquired absence of other organs: Secondary | ICD-10-CM | POA: Diagnosis not present

## 2020-08-08 DIAGNOSIS — Z7984 Long term (current) use of oral hypoglycemic drugs: Secondary | ICD-10-CM | POA: Diagnosis not present

## 2020-08-08 DIAGNOSIS — Z9181 History of falling: Secondary | ICD-10-CM | POA: Diagnosis not present

## 2020-08-08 DIAGNOSIS — L89311 Pressure ulcer of right buttock, stage 1: Secondary | ICD-10-CM | POA: Diagnosis not present

## 2020-08-08 DIAGNOSIS — S72142D Displaced intertrochanteric fracture of left femur, subsequent encounter for closed fracture with routine healing: Secondary | ICD-10-CM | POA: Diagnosis not present

## 2020-08-08 DIAGNOSIS — E1169 Type 2 diabetes mellitus with other specified complication: Secondary | ICD-10-CM | POA: Diagnosis not present

## 2020-08-08 DIAGNOSIS — K219 Gastro-esophageal reflux disease without esophagitis: Secondary | ICD-10-CM | POA: Diagnosis not present

## 2020-08-08 DIAGNOSIS — E785 Hyperlipidemia, unspecified: Secondary | ICD-10-CM | POA: Diagnosis not present

## 2020-08-08 DIAGNOSIS — M1990 Unspecified osteoarthritis, unspecified site: Secondary | ICD-10-CM | POA: Diagnosis not present

## 2020-08-08 DIAGNOSIS — Z7982 Long term (current) use of aspirin: Secondary | ICD-10-CM | POA: Diagnosis not present

## 2020-08-08 DIAGNOSIS — R2689 Other abnormalities of gait and mobility: Secondary | ICD-10-CM | POA: Diagnosis not present

## 2020-08-08 DIAGNOSIS — Z9049 Acquired absence of other specified parts of digestive tract: Secondary | ICD-10-CM | POA: Diagnosis not present

## 2020-08-08 DIAGNOSIS — I10 Essential (primary) hypertension: Secondary | ICD-10-CM | POA: Diagnosis not present

## 2020-08-08 DIAGNOSIS — E039 Hypothyroidism, unspecified: Secondary | ICD-10-CM | POA: Diagnosis not present

## 2020-08-08 DIAGNOSIS — M6281 Muscle weakness (generalized): Secondary | ICD-10-CM | POA: Diagnosis not present

## 2020-08-09 DIAGNOSIS — Z9181 History of falling: Secondary | ICD-10-CM | POA: Diagnosis not present

## 2020-08-09 DIAGNOSIS — Z9089 Acquired absence of other organs: Secondary | ICD-10-CM | POA: Diagnosis not present

## 2020-08-09 DIAGNOSIS — E785 Hyperlipidemia, unspecified: Secondary | ICD-10-CM | POA: Diagnosis not present

## 2020-08-09 DIAGNOSIS — M6281 Muscle weakness (generalized): Secondary | ICD-10-CM | POA: Diagnosis not present

## 2020-08-09 DIAGNOSIS — E1169 Type 2 diabetes mellitus with other specified complication: Secondary | ICD-10-CM | POA: Diagnosis not present

## 2020-08-09 DIAGNOSIS — K219 Gastro-esophageal reflux disease without esophagitis: Secondary | ICD-10-CM | POA: Diagnosis not present

## 2020-08-09 DIAGNOSIS — Z7984 Long term (current) use of oral hypoglycemic drugs: Secondary | ICD-10-CM | POA: Diagnosis not present

## 2020-08-09 DIAGNOSIS — I10 Essential (primary) hypertension: Secondary | ICD-10-CM | POA: Diagnosis not present

## 2020-08-09 DIAGNOSIS — R2689 Other abnormalities of gait and mobility: Secondary | ICD-10-CM | POA: Diagnosis not present

## 2020-08-09 DIAGNOSIS — Z9049 Acquired absence of other specified parts of digestive tract: Secondary | ICD-10-CM | POA: Diagnosis not present

## 2020-08-09 DIAGNOSIS — E039 Hypothyroidism, unspecified: Secondary | ICD-10-CM | POA: Diagnosis not present

## 2020-08-09 DIAGNOSIS — Z7982 Long term (current) use of aspirin: Secondary | ICD-10-CM | POA: Diagnosis not present

## 2020-08-09 DIAGNOSIS — S72142D Displaced intertrochanteric fracture of left femur, subsequent encounter for closed fracture with routine healing: Secondary | ICD-10-CM | POA: Diagnosis not present

## 2020-08-09 DIAGNOSIS — M1990 Unspecified osteoarthritis, unspecified site: Secondary | ICD-10-CM | POA: Diagnosis not present

## 2020-08-09 DIAGNOSIS — L89311 Pressure ulcer of right buttock, stage 1: Secondary | ICD-10-CM | POA: Diagnosis not present

## 2020-08-10 DIAGNOSIS — Z9089 Acquired absence of other organs: Secondary | ICD-10-CM | POA: Diagnosis not present

## 2020-08-10 DIAGNOSIS — M6281 Muscle weakness (generalized): Secondary | ICD-10-CM | POA: Diagnosis not present

## 2020-08-10 DIAGNOSIS — K219 Gastro-esophageal reflux disease without esophagitis: Secondary | ICD-10-CM | POA: Diagnosis not present

## 2020-08-10 DIAGNOSIS — S72142D Displaced intertrochanteric fracture of left femur, subsequent encounter for closed fracture with routine healing: Secondary | ICD-10-CM | POA: Diagnosis not present

## 2020-08-10 DIAGNOSIS — L89311 Pressure ulcer of right buttock, stage 1: Secondary | ICD-10-CM | POA: Diagnosis not present

## 2020-08-10 DIAGNOSIS — Z9049 Acquired absence of other specified parts of digestive tract: Secondary | ICD-10-CM | POA: Diagnosis not present

## 2020-08-10 DIAGNOSIS — R2689 Other abnormalities of gait and mobility: Secondary | ICD-10-CM | POA: Diagnosis not present

## 2020-08-10 DIAGNOSIS — I10 Essential (primary) hypertension: Secondary | ICD-10-CM | POA: Diagnosis not present

## 2020-08-10 DIAGNOSIS — M1990 Unspecified osteoarthritis, unspecified site: Secondary | ICD-10-CM | POA: Diagnosis not present

## 2020-08-10 DIAGNOSIS — Z7982 Long term (current) use of aspirin: Secondary | ICD-10-CM | POA: Diagnosis not present

## 2020-08-10 DIAGNOSIS — E785 Hyperlipidemia, unspecified: Secondary | ICD-10-CM | POA: Diagnosis not present

## 2020-08-10 DIAGNOSIS — E1169 Type 2 diabetes mellitus with other specified complication: Secondary | ICD-10-CM | POA: Diagnosis not present

## 2020-08-10 DIAGNOSIS — Z9181 History of falling: Secondary | ICD-10-CM | POA: Diagnosis not present

## 2020-08-10 DIAGNOSIS — Z7984 Long term (current) use of oral hypoglycemic drugs: Secondary | ICD-10-CM | POA: Diagnosis not present

## 2020-08-10 DIAGNOSIS — E039 Hypothyroidism, unspecified: Secondary | ICD-10-CM | POA: Diagnosis not present

## 2020-08-11 DIAGNOSIS — E785 Hyperlipidemia, unspecified: Secondary | ICD-10-CM | POA: Diagnosis not present

## 2020-08-11 DIAGNOSIS — E1169 Type 2 diabetes mellitus with other specified complication: Secondary | ICD-10-CM | POA: Diagnosis not present

## 2020-08-11 DIAGNOSIS — S72142D Displaced intertrochanteric fracture of left femur, subsequent encounter for closed fracture with routine healing: Secondary | ICD-10-CM | POA: Diagnosis not present

## 2020-08-11 DIAGNOSIS — L89311 Pressure ulcer of right buttock, stage 1: Secondary | ICD-10-CM | POA: Diagnosis not present

## 2020-08-14 DIAGNOSIS — K219 Gastro-esophageal reflux disease without esophagitis: Secondary | ICD-10-CM | POA: Diagnosis not present

## 2020-08-14 DIAGNOSIS — E785 Hyperlipidemia, unspecified: Secondary | ICD-10-CM | POA: Diagnosis not present

## 2020-08-14 DIAGNOSIS — I10 Essential (primary) hypertension: Secondary | ICD-10-CM | POA: Diagnosis not present

## 2020-08-14 DIAGNOSIS — R2689 Other abnormalities of gait and mobility: Secondary | ICD-10-CM | POA: Diagnosis not present

## 2020-08-14 DIAGNOSIS — Z7984 Long term (current) use of oral hypoglycemic drugs: Secondary | ICD-10-CM | POA: Diagnosis not present

## 2020-08-14 DIAGNOSIS — M6281 Muscle weakness (generalized): Secondary | ICD-10-CM | POA: Diagnosis not present

## 2020-08-14 DIAGNOSIS — Z7982 Long term (current) use of aspirin: Secondary | ICD-10-CM | POA: Diagnosis not present

## 2020-08-14 DIAGNOSIS — Z9181 History of falling: Secondary | ICD-10-CM | POA: Diagnosis not present

## 2020-08-14 DIAGNOSIS — Z9049 Acquired absence of other specified parts of digestive tract: Secondary | ICD-10-CM | POA: Diagnosis not present

## 2020-08-14 DIAGNOSIS — M1990 Unspecified osteoarthritis, unspecified site: Secondary | ICD-10-CM | POA: Diagnosis not present

## 2020-08-14 DIAGNOSIS — E039 Hypothyroidism, unspecified: Secondary | ICD-10-CM | POA: Diagnosis not present

## 2020-08-14 DIAGNOSIS — E1169 Type 2 diabetes mellitus with other specified complication: Secondary | ICD-10-CM | POA: Diagnosis not present

## 2020-08-14 DIAGNOSIS — Z9089 Acquired absence of other organs: Secondary | ICD-10-CM | POA: Diagnosis not present

## 2020-08-14 DIAGNOSIS — S72142D Displaced intertrochanteric fracture of left femur, subsequent encounter for closed fracture with routine healing: Secondary | ICD-10-CM | POA: Diagnosis not present

## 2020-08-14 DIAGNOSIS — L89311 Pressure ulcer of right buttock, stage 1: Secondary | ICD-10-CM | POA: Diagnosis not present

## 2020-08-15 DIAGNOSIS — Z7982 Long term (current) use of aspirin: Secondary | ICD-10-CM | POA: Diagnosis not present

## 2020-08-15 DIAGNOSIS — L89311 Pressure ulcer of right buttock, stage 1: Secondary | ICD-10-CM | POA: Diagnosis not present

## 2020-08-15 DIAGNOSIS — R2689 Other abnormalities of gait and mobility: Secondary | ICD-10-CM | POA: Diagnosis not present

## 2020-08-15 DIAGNOSIS — I10 Essential (primary) hypertension: Secondary | ICD-10-CM | POA: Diagnosis not present

## 2020-08-15 DIAGNOSIS — E039 Hypothyroidism, unspecified: Secondary | ICD-10-CM | POA: Diagnosis not present

## 2020-08-15 DIAGNOSIS — K219 Gastro-esophageal reflux disease without esophagitis: Secondary | ICD-10-CM | POA: Diagnosis not present

## 2020-08-15 DIAGNOSIS — Z7984 Long term (current) use of oral hypoglycemic drugs: Secondary | ICD-10-CM | POA: Diagnosis not present

## 2020-08-15 DIAGNOSIS — Z9089 Acquired absence of other organs: Secondary | ICD-10-CM | POA: Diagnosis not present

## 2020-08-15 DIAGNOSIS — M6281 Muscle weakness (generalized): Secondary | ICD-10-CM | POA: Diagnosis not present

## 2020-08-15 DIAGNOSIS — S72142D Displaced intertrochanteric fracture of left femur, subsequent encounter for closed fracture with routine healing: Secondary | ICD-10-CM | POA: Diagnosis not present

## 2020-08-15 DIAGNOSIS — E785 Hyperlipidemia, unspecified: Secondary | ICD-10-CM | POA: Diagnosis not present

## 2020-08-15 DIAGNOSIS — M1990 Unspecified osteoarthritis, unspecified site: Secondary | ICD-10-CM | POA: Diagnosis not present

## 2020-08-15 DIAGNOSIS — Z9049 Acquired absence of other specified parts of digestive tract: Secondary | ICD-10-CM | POA: Diagnosis not present

## 2020-08-15 DIAGNOSIS — Z9181 History of falling: Secondary | ICD-10-CM | POA: Diagnosis not present

## 2020-08-15 DIAGNOSIS — E1169 Type 2 diabetes mellitus with other specified complication: Secondary | ICD-10-CM | POA: Diagnosis not present

## 2020-08-16 ENCOUNTER — Other Ambulatory Visit: Payer: Self-pay | Admitting: Internal Medicine

## 2020-08-16 DIAGNOSIS — Z7982 Long term (current) use of aspirin: Secondary | ICD-10-CM | POA: Diagnosis not present

## 2020-08-16 DIAGNOSIS — E039 Hypothyroidism, unspecified: Secondary | ICD-10-CM | POA: Diagnosis not present

## 2020-08-16 DIAGNOSIS — Z7984 Long term (current) use of oral hypoglycemic drugs: Secondary | ICD-10-CM | POA: Diagnosis not present

## 2020-08-16 DIAGNOSIS — E785 Hyperlipidemia, unspecified: Secondary | ICD-10-CM | POA: Diagnosis not present

## 2020-08-16 DIAGNOSIS — S72142D Displaced intertrochanteric fracture of left femur, subsequent encounter for closed fracture with routine healing: Secondary | ICD-10-CM | POA: Diagnosis not present

## 2020-08-16 DIAGNOSIS — L89311 Pressure ulcer of right buttock, stage 1: Secondary | ICD-10-CM | POA: Diagnosis not present

## 2020-08-16 DIAGNOSIS — Z9181 History of falling: Secondary | ICD-10-CM | POA: Diagnosis not present

## 2020-08-16 DIAGNOSIS — Z9089 Acquired absence of other organs: Secondary | ICD-10-CM | POA: Diagnosis not present

## 2020-08-16 DIAGNOSIS — K219 Gastro-esophageal reflux disease without esophagitis: Secondary | ICD-10-CM | POA: Diagnosis not present

## 2020-08-16 DIAGNOSIS — I10 Essential (primary) hypertension: Secondary | ICD-10-CM | POA: Diagnosis not present

## 2020-08-16 DIAGNOSIS — Z1231 Encounter for screening mammogram for malignant neoplasm of breast: Secondary | ICD-10-CM

## 2020-08-16 DIAGNOSIS — M6281 Muscle weakness (generalized): Secondary | ICD-10-CM | POA: Diagnosis not present

## 2020-08-16 DIAGNOSIS — R2689 Other abnormalities of gait and mobility: Secondary | ICD-10-CM | POA: Diagnosis not present

## 2020-08-16 DIAGNOSIS — E1169 Type 2 diabetes mellitus with other specified complication: Secondary | ICD-10-CM | POA: Diagnosis not present

## 2020-08-16 DIAGNOSIS — M1990 Unspecified osteoarthritis, unspecified site: Secondary | ICD-10-CM | POA: Diagnosis not present

## 2020-08-16 DIAGNOSIS — Z9049 Acquired absence of other specified parts of digestive tract: Secondary | ICD-10-CM | POA: Diagnosis not present

## 2020-08-17 DIAGNOSIS — Z4789 Encounter for other orthopedic aftercare: Secondary | ICD-10-CM | POA: Diagnosis not present

## 2020-08-19 DIAGNOSIS — M1990 Unspecified osteoarthritis, unspecified site: Secondary | ICD-10-CM | POA: Diagnosis not present

## 2020-08-19 DIAGNOSIS — Z9181 History of falling: Secondary | ICD-10-CM | POA: Diagnosis not present

## 2020-08-19 DIAGNOSIS — M6281 Muscle weakness (generalized): Secondary | ICD-10-CM | POA: Diagnosis not present

## 2020-08-19 DIAGNOSIS — E039 Hypothyroidism, unspecified: Secondary | ICD-10-CM | POA: Diagnosis not present

## 2020-08-19 DIAGNOSIS — Z7984 Long term (current) use of oral hypoglycemic drugs: Secondary | ICD-10-CM | POA: Diagnosis not present

## 2020-08-19 DIAGNOSIS — K219 Gastro-esophageal reflux disease without esophagitis: Secondary | ICD-10-CM | POA: Diagnosis not present

## 2020-08-19 DIAGNOSIS — R2689 Other abnormalities of gait and mobility: Secondary | ICD-10-CM | POA: Diagnosis not present

## 2020-08-19 DIAGNOSIS — S72142D Displaced intertrochanteric fracture of left femur, subsequent encounter for closed fracture with routine healing: Secondary | ICD-10-CM | POA: Diagnosis not present

## 2020-08-19 DIAGNOSIS — Z9089 Acquired absence of other organs: Secondary | ICD-10-CM | POA: Diagnosis not present

## 2020-08-19 DIAGNOSIS — Z7982 Long term (current) use of aspirin: Secondary | ICD-10-CM | POA: Diagnosis not present

## 2020-08-19 DIAGNOSIS — E1169 Type 2 diabetes mellitus with other specified complication: Secondary | ICD-10-CM | POA: Diagnosis not present

## 2020-08-19 DIAGNOSIS — L89311 Pressure ulcer of right buttock, stage 1: Secondary | ICD-10-CM | POA: Diagnosis not present

## 2020-08-19 DIAGNOSIS — Z9049 Acquired absence of other specified parts of digestive tract: Secondary | ICD-10-CM | POA: Diagnosis not present

## 2020-08-19 DIAGNOSIS — I10 Essential (primary) hypertension: Secondary | ICD-10-CM | POA: Diagnosis not present

## 2020-08-19 DIAGNOSIS — E785 Hyperlipidemia, unspecified: Secondary | ICD-10-CM | POA: Diagnosis not present

## 2020-08-21 DIAGNOSIS — S72142D Displaced intertrochanteric fracture of left femur, subsequent encounter for closed fracture with routine healing: Secondary | ICD-10-CM | POA: Diagnosis not present

## 2020-08-21 DIAGNOSIS — K219 Gastro-esophageal reflux disease without esophagitis: Secondary | ICD-10-CM | POA: Diagnosis not present

## 2020-08-21 DIAGNOSIS — R2689 Other abnormalities of gait and mobility: Secondary | ICD-10-CM | POA: Diagnosis not present

## 2020-08-21 DIAGNOSIS — Z9181 History of falling: Secondary | ICD-10-CM | POA: Diagnosis not present

## 2020-08-21 DIAGNOSIS — Z9089 Acquired absence of other organs: Secondary | ICD-10-CM | POA: Diagnosis not present

## 2020-08-21 DIAGNOSIS — M1990 Unspecified osteoarthritis, unspecified site: Secondary | ICD-10-CM | POA: Diagnosis not present

## 2020-08-21 DIAGNOSIS — Z9049 Acquired absence of other specified parts of digestive tract: Secondary | ICD-10-CM | POA: Diagnosis not present

## 2020-08-21 DIAGNOSIS — L89311 Pressure ulcer of right buttock, stage 1: Secondary | ICD-10-CM | POA: Diagnosis not present

## 2020-08-21 DIAGNOSIS — Z7982 Long term (current) use of aspirin: Secondary | ICD-10-CM | POA: Diagnosis not present

## 2020-08-21 DIAGNOSIS — E785 Hyperlipidemia, unspecified: Secondary | ICD-10-CM | POA: Diagnosis not present

## 2020-08-21 DIAGNOSIS — E039 Hypothyroidism, unspecified: Secondary | ICD-10-CM | POA: Diagnosis not present

## 2020-08-21 DIAGNOSIS — I10 Essential (primary) hypertension: Secondary | ICD-10-CM | POA: Diagnosis not present

## 2020-08-21 DIAGNOSIS — M6281 Muscle weakness (generalized): Secondary | ICD-10-CM | POA: Diagnosis not present

## 2020-08-21 DIAGNOSIS — Z7984 Long term (current) use of oral hypoglycemic drugs: Secondary | ICD-10-CM | POA: Diagnosis not present

## 2020-08-21 DIAGNOSIS — E1169 Type 2 diabetes mellitus with other specified complication: Secondary | ICD-10-CM | POA: Diagnosis not present

## 2020-08-22 DIAGNOSIS — Z7984 Long term (current) use of oral hypoglycemic drugs: Secondary | ICD-10-CM | POA: Diagnosis not present

## 2020-08-22 DIAGNOSIS — E039 Hypothyroidism, unspecified: Secondary | ICD-10-CM | POA: Diagnosis not present

## 2020-08-22 DIAGNOSIS — Z7982 Long term (current) use of aspirin: Secondary | ICD-10-CM | POA: Diagnosis not present

## 2020-08-22 DIAGNOSIS — L89311 Pressure ulcer of right buttock, stage 1: Secondary | ICD-10-CM | POA: Diagnosis not present

## 2020-08-22 DIAGNOSIS — M1990 Unspecified osteoarthritis, unspecified site: Secondary | ICD-10-CM | POA: Diagnosis not present

## 2020-08-22 DIAGNOSIS — Z9089 Acquired absence of other organs: Secondary | ICD-10-CM | POA: Diagnosis not present

## 2020-08-22 DIAGNOSIS — E785 Hyperlipidemia, unspecified: Secondary | ICD-10-CM | POA: Diagnosis not present

## 2020-08-22 DIAGNOSIS — Z9181 History of falling: Secondary | ICD-10-CM | POA: Diagnosis not present

## 2020-08-22 DIAGNOSIS — E1169 Type 2 diabetes mellitus with other specified complication: Secondary | ICD-10-CM | POA: Diagnosis not present

## 2020-08-22 DIAGNOSIS — M6281 Muscle weakness (generalized): Secondary | ICD-10-CM | POA: Diagnosis not present

## 2020-08-22 DIAGNOSIS — Z9049 Acquired absence of other specified parts of digestive tract: Secondary | ICD-10-CM | POA: Diagnosis not present

## 2020-08-22 DIAGNOSIS — S72142D Displaced intertrochanteric fracture of left femur, subsequent encounter for closed fracture with routine healing: Secondary | ICD-10-CM | POA: Diagnosis not present

## 2020-08-22 DIAGNOSIS — I10 Essential (primary) hypertension: Secondary | ICD-10-CM | POA: Diagnosis not present

## 2020-08-22 DIAGNOSIS — K219 Gastro-esophageal reflux disease without esophagitis: Secondary | ICD-10-CM | POA: Diagnosis not present

## 2020-08-22 DIAGNOSIS — R2689 Other abnormalities of gait and mobility: Secondary | ICD-10-CM | POA: Diagnosis not present

## 2020-08-23 DIAGNOSIS — Z7984 Long term (current) use of oral hypoglycemic drugs: Secondary | ICD-10-CM | POA: Diagnosis not present

## 2020-08-23 DIAGNOSIS — Z9089 Acquired absence of other organs: Secondary | ICD-10-CM | POA: Diagnosis not present

## 2020-08-23 DIAGNOSIS — R2689 Other abnormalities of gait and mobility: Secondary | ICD-10-CM | POA: Diagnosis not present

## 2020-08-23 DIAGNOSIS — E039 Hypothyroidism, unspecified: Secondary | ICD-10-CM | POA: Diagnosis not present

## 2020-08-23 DIAGNOSIS — K219 Gastro-esophageal reflux disease without esophagitis: Secondary | ICD-10-CM | POA: Diagnosis not present

## 2020-08-23 DIAGNOSIS — Z9049 Acquired absence of other specified parts of digestive tract: Secondary | ICD-10-CM | POA: Diagnosis not present

## 2020-08-23 DIAGNOSIS — E1169 Type 2 diabetes mellitus with other specified complication: Secondary | ICD-10-CM | POA: Diagnosis not present

## 2020-08-23 DIAGNOSIS — L89311 Pressure ulcer of right buttock, stage 1: Secondary | ICD-10-CM | POA: Diagnosis not present

## 2020-08-23 DIAGNOSIS — S72142D Displaced intertrochanteric fracture of left femur, subsequent encounter for closed fracture with routine healing: Secondary | ICD-10-CM | POA: Diagnosis not present

## 2020-08-23 DIAGNOSIS — Z9181 History of falling: Secondary | ICD-10-CM | POA: Diagnosis not present

## 2020-08-23 DIAGNOSIS — M6281 Muscle weakness (generalized): Secondary | ICD-10-CM | POA: Diagnosis not present

## 2020-08-23 DIAGNOSIS — M1990 Unspecified osteoarthritis, unspecified site: Secondary | ICD-10-CM | POA: Diagnosis not present

## 2020-08-23 DIAGNOSIS — E785 Hyperlipidemia, unspecified: Secondary | ICD-10-CM | POA: Diagnosis not present

## 2020-08-23 DIAGNOSIS — Z7982 Long term (current) use of aspirin: Secondary | ICD-10-CM | POA: Diagnosis not present

## 2020-08-23 DIAGNOSIS — I10 Essential (primary) hypertension: Secondary | ICD-10-CM | POA: Diagnosis not present

## 2020-08-28 DIAGNOSIS — E039 Hypothyroidism, unspecified: Secondary | ICD-10-CM | POA: Diagnosis not present

## 2020-08-28 DIAGNOSIS — E782 Mixed hyperlipidemia: Secondary | ICD-10-CM | POA: Diagnosis not present

## 2020-08-28 DIAGNOSIS — E119 Type 2 diabetes mellitus without complications: Secondary | ICD-10-CM | POA: Diagnosis not present

## 2020-08-28 DIAGNOSIS — E118 Type 2 diabetes mellitus with unspecified complications: Secondary | ICD-10-CM | POA: Diagnosis not present

## 2020-08-28 DIAGNOSIS — H35039 Hypertensive retinopathy, unspecified eye: Secondary | ICD-10-CM | POA: Diagnosis not present

## 2020-08-28 DIAGNOSIS — I10 Essential (primary) hypertension: Secondary | ICD-10-CM | POA: Diagnosis not present

## 2020-08-28 DIAGNOSIS — Z8781 Personal history of (healed) traumatic fracture: Secondary | ICD-10-CM | POA: Diagnosis not present

## 2020-08-29 DIAGNOSIS — K219 Gastro-esophageal reflux disease without esophagitis: Secondary | ICD-10-CM | POA: Diagnosis not present

## 2020-08-29 DIAGNOSIS — I10 Essential (primary) hypertension: Secondary | ICD-10-CM | POA: Diagnosis not present

## 2020-08-29 DIAGNOSIS — Z7984 Long term (current) use of oral hypoglycemic drugs: Secondary | ICD-10-CM | POA: Diagnosis not present

## 2020-08-29 DIAGNOSIS — E1169 Type 2 diabetes mellitus with other specified complication: Secondary | ICD-10-CM | POA: Diagnosis not present

## 2020-08-29 DIAGNOSIS — R2689 Other abnormalities of gait and mobility: Secondary | ICD-10-CM | POA: Diagnosis not present

## 2020-08-29 DIAGNOSIS — Z7982 Long term (current) use of aspirin: Secondary | ICD-10-CM | POA: Diagnosis not present

## 2020-08-29 DIAGNOSIS — S72142D Displaced intertrochanteric fracture of left femur, subsequent encounter for closed fracture with routine healing: Secondary | ICD-10-CM | POA: Diagnosis not present

## 2020-08-29 DIAGNOSIS — M1990 Unspecified osteoarthritis, unspecified site: Secondary | ICD-10-CM | POA: Diagnosis not present

## 2020-08-29 DIAGNOSIS — Z9181 History of falling: Secondary | ICD-10-CM | POA: Diagnosis not present

## 2020-08-29 DIAGNOSIS — L89311 Pressure ulcer of right buttock, stage 1: Secondary | ICD-10-CM | POA: Diagnosis not present

## 2020-08-29 DIAGNOSIS — M6281 Muscle weakness (generalized): Secondary | ICD-10-CM | POA: Diagnosis not present

## 2020-08-29 DIAGNOSIS — E039 Hypothyroidism, unspecified: Secondary | ICD-10-CM | POA: Diagnosis not present

## 2020-08-29 DIAGNOSIS — E785 Hyperlipidemia, unspecified: Secondary | ICD-10-CM | POA: Diagnosis not present

## 2020-08-29 DIAGNOSIS — Z9089 Acquired absence of other organs: Secondary | ICD-10-CM | POA: Diagnosis not present

## 2020-08-29 DIAGNOSIS — Z9049 Acquired absence of other specified parts of digestive tract: Secondary | ICD-10-CM | POA: Diagnosis not present

## 2020-08-31 DIAGNOSIS — M1990 Unspecified osteoarthritis, unspecified site: Secondary | ICD-10-CM | POA: Diagnosis not present

## 2020-08-31 DIAGNOSIS — Z7984 Long term (current) use of oral hypoglycemic drugs: Secondary | ICD-10-CM | POA: Diagnosis not present

## 2020-08-31 DIAGNOSIS — Z9089 Acquired absence of other organs: Secondary | ICD-10-CM | POA: Diagnosis not present

## 2020-08-31 DIAGNOSIS — S72142D Displaced intertrochanteric fracture of left femur, subsequent encounter for closed fracture with routine healing: Secondary | ICD-10-CM | POA: Diagnosis not present

## 2020-08-31 DIAGNOSIS — M6281 Muscle weakness (generalized): Secondary | ICD-10-CM | POA: Diagnosis not present

## 2020-08-31 DIAGNOSIS — E1169 Type 2 diabetes mellitus with other specified complication: Secondary | ICD-10-CM | POA: Diagnosis not present

## 2020-08-31 DIAGNOSIS — E039 Hypothyroidism, unspecified: Secondary | ICD-10-CM | POA: Diagnosis not present

## 2020-08-31 DIAGNOSIS — R2689 Other abnormalities of gait and mobility: Secondary | ICD-10-CM | POA: Diagnosis not present

## 2020-08-31 DIAGNOSIS — K219 Gastro-esophageal reflux disease without esophagitis: Secondary | ICD-10-CM | POA: Diagnosis not present

## 2020-08-31 DIAGNOSIS — Z9049 Acquired absence of other specified parts of digestive tract: Secondary | ICD-10-CM | POA: Diagnosis not present

## 2020-08-31 DIAGNOSIS — E785 Hyperlipidemia, unspecified: Secondary | ICD-10-CM | POA: Diagnosis not present

## 2020-08-31 DIAGNOSIS — I10 Essential (primary) hypertension: Secondary | ICD-10-CM | POA: Diagnosis not present

## 2020-08-31 DIAGNOSIS — Z9181 History of falling: Secondary | ICD-10-CM | POA: Diagnosis not present

## 2020-08-31 DIAGNOSIS — L89311 Pressure ulcer of right buttock, stage 1: Secondary | ICD-10-CM | POA: Diagnosis not present

## 2020-08-31 DIAGNOSIS — Z7982 Long term (current) use of aspirin: Secondary | ICD-10-CM | POA: Diagnosis not present

## 2020-09-01 DIAGNOSIS — Z9181 History of falling: Secondary | ICD-10-CM | POA: Diagnosis not present

## 2020-09-01 DIAGNOSIS — M6281 Muscle weakness (generalized): Secondary | ICD-10-CM | POA: Diagnosis not present

## 2020-09-01 DIAGNOSIS — Z9089 Acquired absence of other organs: Secondary | ICD-10-CM | POA: Diagnosis not present

## 2020-09-01 DIAGNOSIS — R2689 Other abnormalities of gait and mobility: Secondary | ICD-10-CM | POA: Diagnosis not present

## 2020-09-01 DIAGNOSIS — Z7982 Long term (current) use of aspirin: Secondary | ICD-10-CM | POA: Diagnosis not present

## 2020-09-01 DIAGNOSIS — K219 Gastro-esophageal reflux disease without esophagitis: Secondary | ICD-10-CM | POA: Diagnosis not present

## 2020-09-01 DIAGNOSIS — L89311 Pressure ulcer of right buttock, stage 1: Secondary | ICD-10-CM | POA: Diagnosis not present

## 2020-09-01 DIAGNOSIS — M1990 Unspecified osteoarthritis, unspecified site: Secondary | ICD-10-CM | POA: Diagnosis not present

## 2020-09-01 DIAGNOSIS — E1169 Type 2 diabetes mellitus with other specified complication: Secondary | ICD-10-CM | POA: Diagnosis not present

## 2020-09-01 DIAGNOSIS — Z9049 Acquired absence of other specified parts of digestive tract: Secondary | ICD-10-CM | POA: Diagnosis not present

## 2020-09-01 DIAGNOSIS — Z7984 Long term (current) use of oral hypoglycemic drugs: Secondary | ICD-10-CM | POA: Diagnosis not present

## 2020-09-01 DIAGNOSIS — E785 Hyperlipidemia, unspecified: Secondary | ICD-10-CM | POA: Diagnosis not present

## 2020-09-01 DIAGNOSIS — I10 Essential (primary) hypertension: Secondary | ICD-10-CM | POA: Diagnosis not present

## 2020-09-01 DIAGNOSIS — E039 Hypothyroidism, unspecified: Secondary | ICD-10-CM | POA: Diagnosis not present

## 2020-09-01 DIAGNOSIS — S72142D Displaced intertrochanteric fracture of left femur, subsequent encounter for closed fracture with routine healing: Secondary | ICD-10-CM | POA: Diagnosis not present

## 2020-09-02 DIAGNOSIS — L89311 Pressure ulcer of right buttock, stage 1: Secondary | ICD-10-CM | POA: Diagnosis not present

## 2020-09-04 DIAGNOSIS — Z7984 Long term (current) use of oral hypoglycemic drugs: Secondary | ICD-10-CM | POA: Diagnosis not present

## 2020-09-04 DIAGNOSIS — M1990 Unspecified osteoarthritis, unspecified site: Secondary | ICD-10-CM | POA: Diagnosis not present

## 2020-09-04 DIAGNOSIS — I10 Essential (primary) hypertension: Secondary | ICD-10-CM | POA: Diagnosis not present

## 2020-09-04 DIAGNOSIS — Z7982 Long term (current) use of aspirin: Secondary | ICD-10-CM | POA: Diagnosis not present

## 2020-09-04 DIAGNOSIS — R2689 Other abnormalities of gait and mobility: Secondary | ICD-10-CM | POA: Diagnosis not present

## 2020-09-04 DIAGNOSIS — Z9181 History of falling: Secondary | ICD-10-CM | POA: Diagnosis not present

## 2020-09-04 DIAGNOSIS — S72142D Displaced intertrochanteric fracture of left femur, subsequent encounter for closed fracture with routine healing: Secondary | ICD-10-CM | POA: Diagnosis not present

## 2020-09-04 DIAGNOSIS — M6281 Muscle weakness (generalized): Secondary | ICD-10-CM | POA: Diagnosis not present

## 2020-09-04 DIAGNOSIS — K219 Gastro-esophageal reflux disease without esophagitis: Secondary | ICD-10-CM | POA: Diagnosis not present

## 2020-09-04 DIAGNOSIS — Z9049 Acquired absence of other specified parts of digestive tract: Secondary | ICD-10-CM | POA: Diagnosis not present

## 2020-09-04 DIAGNOSIS — E039 Hypothyroidism, unspecified: Secondary | ICD-10-CM | POA: Diagnosis not present

## 2020-09-04 DIAGNOSIS — L89311 Pressure ulcer of right buttock, stage 1: Secondary | ICD-10-CM | POA: Diagnosis not present

## 2020-09-04 DIAGNOSIS — E785 Hyperlipidemia, unspecified: Secondary | ICD-10-CM | POA: Diagnosis not present

## 2020-09-04 DIAGNOSIS — E1169 Type 2 diabetes mellitus with other specified complication: Secondary | ICD-10-CM | POA: Diagnosis not present

## 2020-09-04 DIAGNOSIS — Z9089 Acquired absence of other organs: Secondary | ICD-10-CM | POA: Diagnosis not present

## 2020-09-05 DIAGNOSIS — M6281 Muscle weakness (generalized): Secondary | ICD-10-CM | POA: Diagnosis not present

## 2020-09-05 DIAGNOSIS — L89311 Pressure ulcer of right buttock, stage 1: Secondary | ICD-10-CM | POA: Diagnosis not present

## 2020-09-05 DIAGNOSIS — K219 Gastro-esophageal reflux disease without esophagitis: Secondary | ICD-10-CM | POA: Diagnosis not present

## 2020-09-05 DIAGNOSIS — E1169 Type 2 diabetes mellitus with other specified complication: Secondary | ICD-10-CM | POA: Diagnosis not present

## 2020-09-05 DIAGNOSIS — S72142D Displaced intertrochanteric fracture of left femur, subsequent encounter for closed fracture with routine healing: Secondary | ICD-10-CM | POA: Diagnosis not present

## 2020-09-05 DIAGNOSIS — Z9181 History of falling: Secondary | ICD-10-CM | POA: Diagnosis not present

## 2020-09-05 DIAGNOSIS — Z9049 Acquired absence of other specified parts of digestive tract: Secondary | ICD-10-CM | POA: Diagnosis not present

## 2020-09-05 DIAGNOSIS — Z7982 Long term (current) use of aspirin: Secondary | ICD-10-CM | POA: Diagnosis not present

## 2020-09-05 DIAGNOSIS — Z9089 Acquired absence of other organs: Secondary | ICD-10-CM | POA: Diagnosis not present

## 2020-09-05 DIAGNOSIS — M1990 Unspecified osteoarthritis, unspecified site: Secondary | ICD-10-CM | POA: Diagnosis not present

## 2020-09-05 DIAGNOSIS — Z7984 Long term (current) use of oral hypoglycemic drugs: Secondary | ICD-10-CM | POA: Diagnosis not present

## 2020-09-05 DIAGNOSIS — E039 Hypothyroidism, unspecified: Secondary | ICD-10-CM | POA: Diagnosis not present

## 2020-09-05 DIAGNOSIS — E785 Hyperlipidemia, unspecified: Secondary | ICD-10-CM | POA: Diagnosis not present

## 2020-09-05 DIAGNOSIS — I10 Essential (primary) hypertension: Secondary | ICD-10-CM | POA: Diagnosis not present

## 2020-09-05 DIAGNOSIS — R2689 Other abnormalities of gait and mobility: Secondary | ICD-10-CM | POA: Diagnosis not present

## 2020-09-06 ENCOUNTER — Telehealth: Payer: Self-pay | Admitting: Physical Medicine and Rehabilitation

## 2020-09-06 NOTE — Telephone Encounter (Signed)
Left L4 and right L5 TF on 06/06/20. Ok to repeat if helped, same problem/side, and no new injury?

## 2020-09-06 NOTE — Telephone Encounter (Signed)
Patient called. She would like an appointment with Dr. Ernestina Patches. Her call back number is [phone]

## 2020-09-08 DIAGNOSIS — H04123 Dry eye syndrome of bilateral lacrimal glands: Secondary | ICD-10-CM | POA: Diagnosis not present

## 2020-09-08 DIAGNOSIS — H47011 Ischemic optic neuropathy, right eye: Secondary | ICD-10-CM | POA: Diagnosis not present

## 2020-09-08 DIAGNOSIS — H16223 Keratoconjunctivitis sicca, not specified as Sjogren's, bilateral: Secondary | ICD-10-CM | POA: Diagnosis not present

## 2020-09-13 ENCOUNTER — Encounter: Payer: Self-pay | Admitting: Physical Medicine and Rehabilitation

## 2020-09-13 ENCOUNTER — Ambulatory Visit: Payer: Medicare Other | Admitting: Physical Medicine and Rehabilitation

## 2020-09-13 ENCOUNTER — Other Ambulatory Visit: Payer: Self-pay

## 2020-09-13 VITALS — BP 121/68 | HR 75

## 2020-09-13 DIAGNOSIS — Z8781 Personal history of (healed) traumatic fracture: Secondary | ICD-10-CM

## 2020-09-13 DIAGNOSIS — M48062 Spinal stenosis, lumbar region with neurogenic claudication: Secondary | ICD-10-CM

## 2020-09-13 DIAGNOSIS — K219 Gastro-esophageal reflux disease without esophagitis: Secondary | ICD-10-CM | POA: Insufficient documentation

## 2020-09-13 DIAGNOSIS — R202 Paresthesia of skin: Secondary | ICD-10-CM

## 2020-09-13 DIAGNOSIS — M25552 Pain in left hip: Secondary | ICD-10-CM | POA: Diagnosis not present

## 2020-09-13 DIAGNOSIS — H35039 Hypertensive retinopathy, unspecified eye: Secondary | ICD-10-CM | POA: Insufficient documentation

## 2020-09-13 MED ORDER — TRAMADOL HCL 50 MG PO TABS: 50.0000 mg | ORAL_TABLET | Freq: Two times a day (BID) | ORAL | 0 refills | Status: AC | PRN

## 2020-09-13 NOTE — Progress Notes (Signed)
Um Injection 5/10. Leg pain improved some following injection. Numbness in feet continued after the injection. "Tightness" in ankles. Low back pain. [place] 07/03/20. Fractured left femur. Surgery in [facility]. Still has pain in left leg- states the is unsure if it is coming from back or femur.  Numeric Pain Rating Scale and Functional Assessment Average Pain 5   In the last MONTH (on 0-10 scale) has pain interfered with the following?  1. General activity like being  able to carry out your everyday physical activities such as walking, climbing stairs, carrying groceries, or moving a chair?  Rating(8)

## 2020-09-13 NOTE — Progress Notes (Signed)
Megan [name] - 86 y.o. female MRN [id number]  Date of birth: [birth year]  Office Visit Note: Visit Date: 09/13/2020 PCP: Merrilee Seashore, MD Referred by: Merrilee Seashore, MD  Subjective: Chief Complaint  Patient presents with   Lower Back - Pain   HPI: Megan [name] is a 86 y.o. female who comes in today For evaluation and management of chronic and worsening severe low back pain with bilateral leg pain and numbness in the lower extremities.  I saw the patient in May of this year and completed transforaminal epidural steroid injection for what is known severe stenosis at L4-5 with severe foraminal narrowing as well.  MRI is quite dated from 2018.  I have also seen her more recent before that time for neck pain and cervical MRI is more current.  Nonetheless she comes in today stating that on 07/03/2020 about a month after I saw her for that injection she had a fall and she ended up with a left intertrochanteric femur fracture now status post intramedullary pinning.  She has been doing well with that although she feels like she is having some pain on the left side that she cannot tell if it is from the hip or her back.  Her main complaint is pain and paresthesia numbness from the knees down bilaterally.  It is not some much in a stocking distribution but is somewhat more L5 distribution posterior laterally and anterior laterally in the foot.  She does have a history of diabetes but with no electrodiagnostic confirmation of neuropathy.  She had been started on a small amount of gabapentin but because of other issues going on is never really increase that dose.  Her pain level is currently a 5 out of 10.  She reports the prior injection did help her back pain and did improve the leg pain.  She felt like the numbness in her feet continued after the injection however.  She ambulates with a walker.  She has not noted any focal weakness or bowel or bladder changes.  Review of Systems  Musculoskeletal:   Positive for back pain and joint pain.  Neurological:  Positive for tingling and sensory change.  All other systems reviewed and are negative. Otherwise per HPI.  Assessment & Plan: Visit Diagnoses:    ICD-10-CM   1. Spinal stenosis of lumbar region with neurogenic claudication  M48.062 MR LUMBAR SPINE WO CONTRAST    2. Paresthesia of skin  R20.2     3. Pain in left hip  M25.552     4. S/p left hip fracture  Z87.81        Plan: Findings:  Chronic long-term history of recalcitrant back pain with at times bilateral hip and leg pain.  This has been consistent with lumbar stenosis for many years and she is done extremely well with injections in the past.  Last injection did help but it did not relieve a lot of the numbness in the feet.  I feel like she probably does have an underlying peripheral polyneuropathy.  She clearly had significant stenosis in 2018.  Now she is status post fall with intertrochanteric hip fracture status post intramedullary pinning.  At this point I want to update MRI of the lumbar spine to see if there is anything new or possible compression fracture etc.  Depending on the results of that would look at injection once again.  I also want to start increasing her gabapentin so that we can slowly get her to 300  mg 3 times a day.  She is currently only taken 100 mg at night.  I did give her some handwritten instructions on how to increase this and she can call us with any questions.  In the interim I am going to provide a temporary amount of tramadol that she can take with the Tylenol that she takes to see if that gives her some pain relief.  Would also consider regrouping with physical therapy but she has had some post hip surgery.   Meds & Orders:  Meds ordered this encounter  Medications   traMADol (ULTRAM) 50 MG tablet    Sig: Take 1 tablet (50 mg total) by mouth every 12 (twelve) hours as needed for up to 8 days for moderate pain.    Dispense:  15 tablet    Refill:  0     Orders Placed This Encounter  Procedures   MR LUMBAR SPINE WO CONTRAST     Follow-up: Return for MRI review after completion.   Procedures: No procedures performed      Clinical History: MRI LUMBAR SPINE WITHOUT CONTRAST   TECHNIQUE: Multiplanar, multisequence MR imaging of the lumbar spine was performed. No intravenous contrast was administered.   COMPARISON:  MRI lumbar spine 08/14/2004.   FINDINGS: Segmentation:  Standard.   Alignment: 0.6 cm anterolisthesis L4 on L5 due to facet arthropathy is new since the prior examination. Trace anterolisthesis L5 on S1 is unchanged.   Vertebrae:  No fracture or worrisome lesion.   Conus medullaris: Extends to the L1-2 level and appears normal.   Paraspinal and other soft tissues: Negative.   Disc levels:   T11-12 is imaged in the sagittal plane only. There is a minimal central protrusion without stenosis.   T12-L1:  Minimal right paracentral protrusion without stenosis.   L1-2:  Negative.   L2-3: Mild facet degenerative disease and a minimal disc bulge without central canal or foraminal stenosis.   L3-4: Moderate facet arthropathy, ligamentum flavum thickening and a shallow disc bulge. There is moderate central canal stenosis with narrowing in the subarticular recesses which could impact either descending L4 root. The foramina are open. Spondylosis has worsened since the prior MRI.   L4-5: Advanced facet degenerative disease and bulky ligamentum flavum thickening are seen. The disc is uncovered with a shallow bulge. There is severe central canal and bilateral subarticular recess narrowing. Mild left foraminal narrowing is seen. The right foramen is open. Spondylosis is much worse than on the prior MRI.   L5-S1: Advanced facet degenerative disease is worse on the left. There is a shallow broad-based central protrusion and ligamentum flavum thickening. Narrowing is seen in the subarticular recesses. Spondylosis has  worsened since the prior MRI.   IMPRESSION: Worsened spondylosis at L4-5 where there is severe central canal and bilateral subarticular recess narrowing due to bulky ligamentum flavum thickening and a shallow disc bulge. Advanced facet arthropathy at this level results in 0.6 cm anterolisthesis.   Worsened spondylosis at L3-4 where there is moderate central canal stenosis and narrowing in the subarticular recesses which could impact either descending L4 root.   Some progression spondylosis at L5-S1 where there is a shallow broad-based central protrusion and facet arthropathy resulting in narrowing in the subarticular recesses which could irritate either descending S1 root.     Electronically Signed   By: Inge Rise M.D.   On: 11/26/2016 12:04   She reports that she has never smoked. She has never used smokeless tobacco. No results for input(s):  HGBA1C, LABURIC in the last 8760 hours.  Objective:  VS:  HT:    WT:   BMI:     BP:121/68  HR:75bpm  TEMP: ( )  RESP:  Physical Exam Vitals and nursing note reviewed.  Constitutional:      General: She is not in acute distress.    Appearance: Normal appearance. She is obese. She is not ill-appearing.  HENT:     Head: Normocephalic and atraumatic.     Right Ear: External ear normal.     Left Ear: External ear normal.  Eyes:     Extraocular Movements: Extraocular movements intact.  Cardiovascular:     Rate and Rhythm: Normal rate.     Pulses: Normal pulses.  Pulmonary:     Effort: Pulmonary effort is normal. No respiratory distress.  Abdominal:     General: There is no distension.     Palpations: Abdomen is soft.  Musculoskeletal:        General: Tenderness present.     Cervical back: Neck supple.     Right lower leg: Edema present.     Left lower leg: Edema present.     Comments: Patient ambulates with a rolling walker.  She has good strength in the lower extremities with hip flexion and knee flexion extension as  well as dorsiflexion plantarflexion EHL.  She has some dysesthesia really nondermatomal.  She has a negative slump test bilaterally.  No real pain with hip rotation.  Some tenderness over the greater trochanter.  She does have concordant back pain with extension of the lumbar spine and facet loading.  Skin:    Findings: No erythema, lesion or rash.  Neurological:     General: No focal deficit present.     Mental Status: She is alert and oriented to person, place, and time.     Cranial Nerves: No cranial nerve deficit.     Sensory: No sensory deficit.     Motor: No weakness or abnormal muscle tone.     Coordination: Coordination normal.     Gait: Gait abnormal.  Psychiatric:        Mood and Affect: Mood normal.        Behavior: Behavior normal.    Ortho Exam  Imaging: No results found.  Past Medical/Family/Surgical/Social History: Medications & Allergies reviewed per EMR, new medications updated. Patient Active Problem List   Diagnosis Date Noted   Hypertensive retinopathy 09/13/2020   Gastroesophageal reflux disease 09/13/2020   Dyspnea on exertion 03/23/2018   Other secondary pulmonary hypertension ([place]) 03/23/2018   S/P TAVR (transcatheter aortic valve replacement) 03/03/2018   Diabetes mellitus without complication (HCC)    Hypertension    Hypothyroidism    Anterior ischemic optic neuropathy of right eye 05/28/2017   Snoring 05/28/2017   OSA (obstructive sleep apnea) 05/28/2017   Shoulder arthritis 10/29/2016   Severe aortic stenosis 06/21/2016   Lightheaded 06/05/2016   Poor balance 06/05/2016   Sensorineural hearing loss (SNHL), bilateral 02/02/2016   Arthritis of shoulder region, left, degenerative 07/14/2014   Past Medical History:  Diagnosis Date   Anemia    Arthritis    Cancer ([place]) 1989   melanoma   Diabetes mellitus without complication (HCC)    Dizziness    GERD (gastroesophageal reflux disease)    History of hiatal hernia    Hypertension     Hypothyroidism    IBS (irritable bowel syndrome)    S/P TAVR (transcatheter aortic valve replacement) 03/03/2018   s/p 23 mm  Edwards Sapien THV via the TF approach   Severe aortic stenosis    Family History  Problem Relation Age of Onset   Hypertension Mother    Hypertension Father    Heart disease Father    Heart disease Brother    Past Surgical History:  Procedure Laterality Date   APPENDECTOMY     86 yrs old   CARPAL TUNNEL RELEASE Right 10/1999   [place]ierks, MD   CHOLECYSTECTOMY  89   COLONOSCOPY     EYE SURGERY Bilateral 2017   cataract surgery with lens implants   JOINT REPLACEMENT Right    knee    REVERSE SHOULDER ARTHROPLASTY Left 07/14/2014   REVERSE SHOULDER ARTHROPLASTY Left 07/14/2014   Procedure: LEFT REVERSE SHOULDER ARTHROPLASTY;  Surgeon: Meredith Pel, MD;  Location: [facility];  Service: Orthopedics;  Laterality: Left;   RIGHT/LEFT HEART CATH AND CORONARY ANGIOGRAPHY N/A 01/29/2018   Procedure: RIGHT/LEFT HEART CATH AND CORONARY ANGIOGRAPHY;  Surgeon: Belva Crome, MD;  Location: [facility];  Service: Cardiovascular;  Laterality: N/A;   SHOULDER ARTHROSCOPY Left    TEE WITHOUT CARDIOVERSION N/A 03/03/2018   Procedure: TRANSESOPHAGEAL ECHOCARDIOGRAM (TEE);  Surgeon: Sherren Mocha, MD;  Location: [facility];  Service: Open Heart Surgery;  Laterality: N/A;   TOTAL SHOULDER REVISION Right 10/29/2016   Procedure: RIGHT REVERSE TOTAL SHOULDER;  Surgeon: Meredith Pel, MD;  Location: [facility];  Service: Orthopedics;  Laterality: Right;   TRANSCATHETER AORTIC VALVE REPLACEMENT, TRANSFEMORAL N/A 03/03/2018   Procedure: TRANSCATHETER AORTIC VALVE REPLACEMENT, TRANSFEMORAL;  Surgeon: Sherren Mocha, MD;  Location: [facility];  Service: Open Heart Surgery;  Laterality: N/A;   Social History   Occupational History   Not on file  Tobacco Use   Smoking status: Never   Smokeless tobacco: Never  Vaping Use   Vaping Use: Never used  Substance and  Sexual Activity   Alcohol use: No   Drug use: No   Sexual activity: Not on file

## 2020-09-15 DIAGNOSIS — S72142D Displaced intertrochanteric fracture of left femur, subsequent encounter for closed fracture with routine healing: Secondary | ICD-10-CM | POA: Diagnosis not present

## 2020-09-15 DIAGNOSIS — M1990 Unspecified osteoarthritis, unspecified site: Secondary | ICD-10-CM | POA: Diagnosis not present

## 2020-09-15 DIAGNOSIS — E1169 Type 2 diabetes mellitus with other specified complication: Secondary | ICD-10-CM | POA: Diagnosis not present

## 2020-09-15 DIAGNOSIS — E785 Hyperlipidemia, unspecified: Secondary | ICD-10-CM | POA: Diagnosis not present

## 2020-09-15 DIAGNOSIS — M6281 Muscle weakness (generalized): Secondary | ICD-10-CM | POA: Diagnosis not present

## 2020-09-15 DIAGNOSIS — Z9049 Acquired absence of other specified parts of digestive tract: Secondary | ICD-10-CM | POA: Diagnosis not present

## 2020-09-15 DIAGNOSIS — Z7984 Long term (current) use of oral hypoglycemic drugs: Secondary | ICD-10-CM | POA: Diagnosis not present

## 2020-09-15 DIAGNOSIS — I10 Essential (primary) hypertension: Secondary | ICD-10-CM | POA: Diagnosis not present

## 2020-09-15 DIAGNOSIS — Z7982 Long term (current) use of aspirin: Secondary | ICD-10-CM | POA: Diagnosis not present

## 2020-09-15 DIAGNOSIS — K219 Gastro-esophageal reflux disease without esophagitis: Secondary | ICD-10-CM | POA: Diagnosis not present

## 2020-09-15 DIAGNOSIS — E039 Hypothyroidism, unspecified: Secondary | ICD-10-CM | POA: Diagnosis not present

## 2020-09-15 DIAGNOSIS — R2689 Other abnormalities of gait and mobility: Secondary | ICD-10-CM | POA: Diagnosis not present

## 2020-09-15 DIAGNOSIS — Z9089 Acquired absence of other organs: Secondary | ICD-10-CM | POA: Diagnosis not present

## 2020-09-15 DIAGNOSIS — L89311 Pressure ulcer of right buttock, stage 1: Secondary | ICD-10-CM | POA: Diagnosis not present

## 2020-09-15 DIAGNOSIS — Z9181 History of falling: Secondary | ICD-10-CM | POA: Diagnosis not present

## 2020-09-19 DIAGNOSIS — S72142D Displaced intertrochanteric fracture of left femur, subsequent encounter for closed fracture with routine healing: Secondary | ICD-10-CM | POA: Diagnosis not present

## 2020-09-19 DIAGNOSIS — D1801 Hemangioma of skin and subcutaneous tissue: Secondary | ICD-10-CM | POA: Diagnosis not present

## 2020-09-19 DIAGNOSIS — E785 Hyperlipidemia, unspecified: Secondary | ICD-10-CM | POA: Diagnosis not present

## 2020-09-19 DIAGNOSIS — E039 Hypothyroidism, unspecified: Secondary | ICD-10-CM | POA: Diagnosis not present

## 2020-09-19 DIAGNOSIS — R2689 Other abnormalities of gait and mobility: Secondary | ICD-10-CM | POA: Diagnosis not present

## 2020-09-19 DIAGNOSIS — L905 Scar conditions and fibrosis of skin: Secondary | ICD-10-CM | POA: Diagnosis not present

## 2020-09-19 DIAGNOSIS — Z7982 Long term (current) use of aspirin: Secondary | ICD-10-CM | POA: Diagnosis not present

## 2020-09-19 DIAGNOSIS — Z9181 History of falling: Secondary | ICD-10-CM | POA: Diagnosis not present

## 2020-09-19 DIAGNOSIS — L821 Other seborrheic keratosis: Secondary | ICD-10-CM | POA: Diagnosis not present

## 2020-09-19 DIAGNOSIS — Z9089 Acquired absence of other organs: Secondary | ICD-10-CM | POA: Diagnosis not present

## 2020-09-19 DIAGNOSIS — E1169 Type 2 diabetes mellitus with other specified complication: Secondary | ICD-10-CM | POA: Diagnosis not present

## 2020-09-19 DIAGNOSIS — K219 Gastro-esophageal reflux disease without esophagitis: Secondary | ICD-10-CM | POA: Diagnosis not present

## 2020-09-19 DIAGNOSIS — Z85820 Personal history of malignant melanoma of skin: Secondary | ICD-10-CM | POA: Diagnosis not present

## 2020-09-19 DIAGNOSIS — L89311 Pressure ulcer of right buttock, stage 1: Secondary | ICD-10-CM | POA: Diagnosis not present

## 2020-09-19 DIAGNOSIS — L570 Actinic keratosis: Secondary | ICD-10-CM | POA: Diagnosis not present

## 2020-09-19 DIAGNOSIS — I10 Essential (primary) hypertension: Secondary | ICD-10-CM | POA: Diagnosis not present

## 2020-09-19 DIAGNOSIS — Z7984 Long term (current) use of oral hypoglycemic drugs: Secondary | ICD-10-CM | POA: Diagnosis not present

## 2020-09-19 DIAGNOSIS — M1990 Unspecified osteoarthritis, unspecified site: Secondary | ICD-10-CM | POA: Diagnosis not present

## 2020-09-19 DIAGNOSIS — M6281 Muscle weakness (generalized): Secondary | ICD-10-CM | POA: Diagnosis not present

## 2020-09-19 DIAGNOSIS — L814 Other melanin hyperpigmentation: Secondary | ICD-10-CM | POA: Diagnosis not present

## 2020-09-19 DIAGNOSIS — Z9049 Acquired absence of other specified parts of digestive tract: Secondary | ICD-10-CM | POA: Diagnosis not present

## 2020-09-21 DIAGNOSIS — I10 Essential (primary) hypertension: Secondary | ICD-10-CM | POA: Diagnosis not present

## 2020-09-21 DIAGNOSIS — L89311 Pressure ulcer of right buttock, stage 1: Secondary | ICD-10-CM | POA: Diagnosis not present

## 2020-09-21 DIAGNOSIS — R2689 Other abnormalities of gait and mobility: Secondary | ICD-10-CM | POA: Diagnosis not present

## 2020-09-21 DIAGNOSIS — E1169 Type 2 diabetes mellitus with other specified complication: Secondary | ICD-10-CM | POA: Diagnosis not present

## 2020-09-21 DIAGNOSIS — Z7982 Long term (current) use of aspirin: Secondary | ICD-10-CM | POA: Diagnosis not present

## 2020-09-21 DIAGNOSIS — Z7984 Long term (current) use of oral hypoglycemic drugs: Secondary | ICD-10-CM | POA: Diagnosis not present

## 2020-09-21 DIAGNOSIS — E785 Hyperlipidemia, unspecified: Secondary | ICD-10-CM | POA: Diagnosis not present

## 2020-09-21 DIAGNOSIS — E039 Hypothyroidism, unspecified: Secondary | ICD-10-CM | POA: Diagnosis not present

## 2020-09-21 DIAGNOSIS — M6281 Muscle weakness (generalized): Secondary | ICD-10-CM | POA: Diagnosis not present

## 2020-09-21 DIAGNOSIS — M1990 Unspecified osteoarthritis, unspecified site: Secondary | ICD-10-CM | POA: Diagnosis not present

## 2020-09-21 DIAGNOSIS — Z9181 History of falling: Secondary | ICD-10-CM | POA: Diagnosis not present

## 2020-09-21 DIAGNOSIS — K219 Gastro-esophageal reflux disease without esophagitis: Secondary | ICD-10-CM | POA: Diagnosis not present

## 2020-09-21 DIAGNOSIS — Z9089 Acquired absence of other organs: Secondary | ICD-10-CM | POA: Diagnosis not present

## 2020-09-21 DIAGNOSIS — Z9049 Acquired absence of other specified parts of digestive tract: Secondary | ICD-10-CM | POA: Diagnosis not present

## 2020-09-21 DIAGNOSIS — S72142D Displaced intertrochanteric fracture of left femur, subsequent encounter for closed fracture with routine healing: Secondary | ICD-10-CM | POA: Diagnosis not present

## 2020-09-25 DIAGNOSIS — M1990 Unspecified osteoarthritis, unspecified site: Secondary | ICD-10-CM | POA: Diagnosis not present

## 2020-09-25 DIAGNOSIS — Z7984 Long term (current) use of oral hypoglycemic drugs: Secondary | ICD-10-CM | POA: Diagnosis not present

## 2020-09-25 DIAGNOSIS — M6281 Muscle weakness (generalized): Secondary | ICD-10-CM | POA: Diagnosis not present

## 2020-09-25 DIAGNOSIS — L89311 Pressure ulcer of right buttock, stage 1: Secondary | ICD-10-CM | POA: Diagnosis not present

## 2020-09-25 DIAGNOSIS — Z9181 History of falling: Secondary | ICD-10-CM | POA: Diagnosis not present

## 2020-09-25 DIAGNOSIS — E785 Hyperlipidemia, unspecified: Secondary | ICD-10-CM | POA: Diagnosis not present

## 2020-09-25 DIAGNOSIS — I10 Essential (primary) hypertension: Secondary | ICD-10-CM | POA: Diagnosis not present

## 2020-09-25 DIAGNOSIS — R2689 Other abnormalities of gait and mobility: Secondary | ICD-10-CM | POA: Diagnosis not present

## 2020-09-25 DIAGNOSIS — E1169 Type 2 diabetes mellitus with other specified complication: Secondary | ICD-10-CM | POA: Diagnosis not present

## 2020-09-25 DIAGNOSIS — E039 Hypothyroidism, unspecified: Secondary | ICD-10-CM | POA: Diagnosis not present

## 2020-09-25 DIAGNOSIS — S72142D Displaced intertrochanteric fracture of left femur, subsequent encounter for closed fracture with routine healing: Secondary | ICD-10-CM | POA: Diagnosis not present

## 2020-09-25 DIAGNOSIS — Z9089 Acquired absence of other organs: Secondary | ICD-10-CM | POA: Diagnosis not present

## 2020-09-25 DIAGNOSIS — Z9049 Acquired absence of other specified parts of digestive tract: Secondary | ICD-10-CM | POA: Diagnosis not present

## 2020-09-25 DIAGNOSIS — Z7982 Long term (current) use of aspirin: Secondary | ICD-10-CM | POA: Diagnosis not present

## 2020-09-25 DIAGNOSIS — K219 Gastro-esophageal reflux disease without esophagitis: Secondary | ICD-10-CM | POA: Diagnosis not present

## 2020-09-28 DIAGNOSIS — S72142D Displaced intertrochanteric fracture of left femur, subsequent encounter for closed fracture with routine healing: Secondary | ICD-10-CM | POA: Diagnosis not present

## 2020-09-28 DIAGNOSIS — E1169 Type 2 diabetes mellitus with other specified complication: Secondary | ICD-10-CM | POA: Diagnosis not present

## 2020-09-28 DIAGNOSIS — Z9181 History of falling: Secondary | ICD-10-CM | POA: Diagnosis not present

## 2020-09-28 DIAGNOSIS — M1990 Unspecified osteoarthritis, unspecified site: Secondary | ICD-10-CM | POA: Diagnosis not present

## 2020-09-28 DIAGNOSIS — Z9089 Acquired absence of other organs: Secondary | ICD-10-CM | POA: Diagnosis not present

## 2020-09-28 DIAGNOSIS — I10 Essential (primary) hypertension: Secondary | ICD-10-CM | POA: Diagnosis not present

## 2020-09-28 DIAGNOSIS — M6281 Muscle weakness (generalized): Secondary | ICD-10-CM | POA: Diagnosis not present

## 2020-09-28 DIAGNOSIS — L89311 Pressure ulcer of right buttock, stage 1: Secondary | ICD-10-CM | POA: Diagnosis not present

## 2020-09-28 DIAGNOSIS — Z7982 Long term (current) use of aspirin: Secondary | ICD-10-CM | POA: Diagnosis not present

## 2020-09-28 DIAGNOSIS — E785 Hyperlipidemia, unspecified: Secondary | ICD-10-CM | POA: Diagnosis not present

## 2020-09-28 DIAGNOSIS — E039 Hypothyroidism, unspecified: Secondary | ICD-10-CM | POA: Diagnosis not present

## 2020-09-28 DIAGNOSIS — R2689 Other abnormalities of gait and mobility: Secondary | ICD-10-CM | POA: Diagnosis not present

## 2020-09-28 DIAGNOSIS — Z9049 Acquired absence of other specified parts of digestive tract: Secondary | ICD-10-CM | POA: Diagnosis not present

## 2020-09-28 DIAGNOSIS — Z7984 Long term (current) use of oral hypoglycemic drugs: Secondary | ICD-10-CM | POA: Diagnosis not present

## 2020-09-28 DIAGNOSIS — K219 Gastro-esophageal reflux disease without esophagitis: Secondary | ICD-10-CM | POA: Diagnosis not present

## 2020-09-30 ENCOUNTER — Ambulatory Visit
Admission: RE | Admit: 2020-09-30 | Discharge: 2020-09-30 | Disposition: A | Discharge status: Home / Self Care | Payer: Medicare Other | Source: Ambulatory Visit | Attending: Physical Medicine and Rehabilitation | Admitting: Physical Medicine and Rehabilitation

## 2020-09-30 ENCOUNTER — Other Ambulatory Visit: Payer: Self-pay

## 2020-09-30 DIAGNOSIS — M545 Low back pain, unspecified: Secondary | ICD-10-CM | POA: Diagnosis not present

## 2020-09-30 DIAGNOSIS — M48061 Spinal stenosis, lumbar region without neurogenic claudication: Secondary | ICD-10-CM | POA: Diagnosis not present

## 2020-09-30 DIAGNOSIS — M48062 Spinal stenosis, lumbar region with neurogenic claudication: Secondary | ICD-10-CM

## 2020-10-04 ENCOUNTER — Telehealth: Payer: Self-pay | Admitting: Physical Medicine and Rehabilitation

## 2020-10-04 DIAGNOSIS — M48062 Spinal stenosis, lumbar region with neurogenic claudication: Secondary | ICD-10-CM

## 2020-10-04 NOTE — Telephone Encounter (Signed)
Left message #1 to schedule MRI review with bilateral L5 TF.

## 2020-10-04 NOTE — Telephone Encounter (Signed)
-----   Message from Magnus Sinning, MD sent at 10/03/2020 12:02 PM EDT ----- Regarding: s/p MRI Try bilat L5 tf esi, consider L5-S1 interlam

## 2020-10-04 NOTE — Telephone Encounter (Signed)
Patient called. Returning a call to schedule with Dr. Newton.  ?

## 2020-10-05 NOTE — Telephone Encounter (Signed)
See previous message

## 2020-10-05 NOTE — Telephone Encounter (Signed)
Referral placed and patient scheduled.

## 2020-10-09 ENCOUNTER — Other Ambulatory Visit: Payer: Self-pay

## 2020-10-09 ENCOUNTER — Telehealth: Payer: Self-pay | Admitting: Physical Medicine and Rehabilitation

## 2020-10-09 ENCOUNTER — Ambulatory Visit
Admission: RE | Admit: 2020-10-09 | Discharge: 2020-10-09 | Disposition: A | Discharge status: Home / Self Care | Payer: Medicare Other | Source: Ambulatory Visit | Attending: Internal Medicine | Admitting: Internal Medicine

## 2020-10-09 DIAGNOSIS — Z1231 Encounter for screening mammogram for malignant neoplasm of breast: Secondary | ICD-10-CM

## 2020-10-09 MED ORDER — GABAPENTIN 100 MG PO CAPS: 100.0000 mg | ORAL_CAPSULE | Freq: Every day | ORAL | 2 refills | Status: DC

## 2020-10-09 NOTE — Telephone Encounter (Signed)
Received request from pharmacy for new prescription of Gabapentin '100mg'$  capsule three times per day.

## 2020-10-11 ENCOUNTER — Telehealth: Payer: Self-pay | Admitting: Physical Medicine and Rehabilitation

## 2020-10-11 NOTE — Telephone Encounter (Signed)
Gabapentin 100 mg prescribed 9/12. Please advise.

## 2020-10-11 NOTE — Telephone Encounter (Signed)
Patient returned call asked for a call back    Pt. Number [phone]

## 2020-10-11 NOTE — Telephone Encounter (Signed)
Patient called. Insurance will not pay for the dose that Dr. Ernestina Patches prescribed. Would need to change the dosages on the medication. Her call back number is [phone]

## 2020-10-12 ENCOUNTER — Telehealth: Payer: Self-pay

## 2020-10-12 NOTE — Telephone Encounter (Signed)
Per pharmacy, it is too early for a refill with current dosage instructions. Called patient to ask how she is taking this. Left message #1.

## 2020-10-12 NOTE — Telephone Encounter (Signed)
Pt returned a phone call for courtney

## 2020-10-12 NOTE — Telephone Encounter (Signed)
See previous message

## 2020-10-12 NOTE — Telephone Encounter (Signed)
Patient states that before she ran out of gabapentin she was taking 100 mg 3 in the morning, 2 at noon, and 3 at night.

## 2020-10-16 ENCOUNTER — Ambulatory Visit: Payer: Self-pay

## 2020-10-16 ENCOUNTER — Encounter: Payer: Self-pay | Admitting: Physical Medicine and Rehabilitation

## 2020-10-16 ENCOUNTER — Other Ambulatory Visit: Payer: Self-pay

## 2020-10-16 ENCOUNTER — Ambulatory Visit: Payer: Medicare Other | Admitting: Physical Medicine and Rehabilitation

## 2020-10-16 VITALS — BP 153/69 | HR 90

## 2020-10-16 DIAGNOSIS — M48062 Spinal stenosis, lumbar region with neurogenic claudication: Secondary | ICD-10-CM

## 2020-10-16 MED ORDER — METHYLPREDNISOLONE ACETATE 80 MG/ML IJ SUSP
80.0000 mg | Freq: Once | INTRAMUSCULAR | Status: AC
  Administered 2020-10-16: 80 mg

## 2020-10-16 NOTE — Patient Instructions (Signed)

## 2020-10-16 NOTE — Progress Notes (Signed)
Pt state lower back pain that travels down both legs. Pt state laying in bed and getting up makes the pain worse. Pt state she takes over the counter pain meds to help ease her pain. Pt state she fell and had surgery in June 22. Pt has hx of inj on 06/06/20 pt state it helped.  Numeric Pain Rating Scale and Functional Assessment Average Pain 2   In the last MONTH (on 0-10 scale) has pain interfered with the following?  1. General activity like being  able to carry out your everyday physical activities such as walking, climbing stairs, carrying groceries, or moving a chair?  Rating(8)   +Driver, -BT, -Dye Allergies.

## 2020-10-17 ENCOUNTER — Telehealth: Payer: Self-pay | Admitting: Physical Medicine and Rehabilitation

## 2020-10-17 NOTE — Telephone Encounter (Signed)
Pt called stating she was supposed to have a rx sent in to [facility] in [place] but they haven't received anything. Pt would like to have the rx resent and would like a CB when that's done please.   [phone]

## 2020-10-17 NOTE — Telephone Encounter (Signed)
Please advise 

## 2020-10-18 MED ORDER — GABAPENTIN 100 MG PO CAPS: 100.0000 mg | ORAL_CAPSULE | Freq: Three times a day (TID) | ORAL | 1 refills | Status: DC

## 2020-10-18 NOTE — Telephone Encounter (Signed)
Called patient to advise and scheduled follow up.

## 2020-10-27 NOTE — Progress Notes (Signed)
Megan [name] - 86 y.o. female MRN [id number]  Date of birth: [birth year]  Office Visit Note: Visit Date: 10/16/2020 PCP: Merrilee Seashore, MD Referred by: Merrilee Seashore, MD  Subjective: Chief Complaint  Patient presents with   Lower Back - Pain   Left Leg - Pain   Right Leg - Pain    HPI:  Megan [name] is a 86 y.o. female who comes in today For planned bilateral L5 transforaminal epidural steroid injections for chronic severe worsening low back pain with bilateral L5 radicular leg pain with dysesthesias in the same dermatome.  She had a history of lumbar stenosis with good relief at times with epidural steroid injections.  Last time she was here for injection was in May and she said it did help.  On return visit we have updated her MRI which was getting quite old and just to see if there is anything that we are missing.  She said no red flag complaints otherwise.  New MRI is dictated below but does show severe stenosis at L4-5 with stairstep listhesis and lateral recess narrowing at L4-5 likely affecting the L5 nerve roots as well.  She has severe foraminal narrowing which makes this injection difficult.  We will try bilateral L5 injections today to see how she does.  Would consider S1 transforaminal injections versus going up higher.  At her age she is not a great surgical candidate although we could consider that.  She will continue with current medications and directed home exercise.  This is been an ongoing recalcitrant situation with her for some time.  ROS Otherwise per HPI.  Assessment & Plan: Visit Diagnoses:    ICD-10-CM   1. Spinal stenosis of lumbar region with neurogenic claudication  M48.062 XR C-ARM NO REPORT    Epidural Steroid injection    methylPREDNISolone acetate (DEPO-MEDROL) injection 80 mg      Plan: No additional findings.   Meds & Orders:  Meds ordered this encounter  Medications   methylPREDNISolone acetate (DEPO-MEDROL) injection 80 mg    Orders  Placed This Encounter  Procedures   XR C-ARM NO REPORT   Epidural Steroid injection    Follow-up: Return if symptoms worsen or fail to improve.   Procedures: No procedures performed  Lumbosacral Transforaminal Epidural Steroid Injection - Sub-Pedicular Approach with Fluoroscopic Guidance  [name]      Date of Birth: 1935-01-06 MRN: [id number] PCP: Merrilee Seashore, MD      Visit Date: 10/16/2020   Universal Protocol:    Date/Time: 10/16/2020  Consent Given By: the patient  Position: PRONE  Additional Comments: Vital signs were monitored before and after the procedure. Patient was prepped and draped in the usual sterile fashion. The correct patient, procedure, and site was verified.   Injection Procedure Details:   Procedure diagnoses: Spinal stenosis of lumbar region with neurogenic claudication [M48.062]    Meds Administered:  Meds ordered this encounter  Medications   methylPREDNISolone acetate (DEPO-MEDROL) injection 80 mg    Laterality: Bilateral  Location/Site: L5  Needle:5.0 in., 22 ga.  Short bevel or Quincke spinal needle  Needle Placement: Transforaminal  Findings:    -Comments: Excellent flow of contrast along the nerve, nerve root and into the epidural space.  Procedure Details: After squaring off the end-plates to get a true AP view, the C-arm was positioned so that an oblique view of the foramen as noted above was visualized. The target area is just inferior to the "nose of the  scotty dog" or sub pedicular. The soft tissues overlying this structure were infiltrated with 2-3 ml. of 1% Lidocaine without Epinephrine.  The spinal needle was inserted toward the target using a "trajectory" view along the fluoroscope beam.  Under AP and lateral visualization, the needle was advanced so it did not puncture dura and was located close the 6 O'Clock position of the pedical in AP tracterory. Biplanar projections were used to confirm position.  Aspiration was confirmed to be negative for CSF and/or blood. A 1-2 ml. volume of Isovue-250 was injected and flow of contrast was noted at each level. Radiographs were obtained for documentation purposes.   After attaining the desired flow of contrast documented above, a 0.5 to 1.0 ml test dose of 0.25% Marcaine was injected into each respective transforaminal space.  The patient was observed for 90 seconds post injection.  After no sensory deficits were reported, and normal lower extremity motor function was noted,   the above injectate was administered so that equal amounts of the injectate were placed at each foramen (level) into the transforaminal epidural space.   Additional Comments:  The patient tolerated the procedure well Dressing: 2 x 2 sterile gauze and Band-Aid    Post-procedure details: Patient was observed during the procedure. Post-procedure instructions were reviewed.  Patient left the clinic in stable condition.    Clinical History: MRI LUMBAR SPINE WITHOUT CONTRAST   TECHNIQUE: Multiplanar, multisequence MR imaging of the lumbar spine was performed. No intravenous contrast was administered.   COMPARISON:  11/26/2016   FINDINGS: Segmentation:  5 lumbar type vertebrae   Alignment:  Degenerative grade 1 anterolisthesis at L4-5 and L5-S1.   Vertebrae: No fracture, evidence of discitis, or bone lesion. No pars defects on a 2020 CT of the abdomen.   Conus medullaris and cauda equina: Conus extends to the L1-2 level. Conus and cauda equina appear normal.   Paraspinal and other soft tissues: Negative for perispinal mass or inflammation.   Disc levels:   T12- L1: Unremarkable.   L1-L2: Ventral spondylitic spurring   L2-L3: Facet spurring with disc narrowing and bulging. Mild left foraminal narrowing   L3-L4: Facet osteoarthritis with spurring and ligamentum flavum thickening. Circumferential disc bulging. Moderate spinal stenosis which is progressed.  Mild-to-moderate bilateral foraminal narrowing   L4-L5: Facet osteoarthritis with bulky spurring and ligamentum flavum thickening. Anterolisthesis. The disc is narrowed and bulging. Severe spinal stenosis with noncompressive bilateral foraminal narrowing   L5-S1:Facet osteoarthritis with spurring and mild anterolisthesis. Disc space narrowing and bulging with patent foramina. Bilateral subarticular recess narrowing that could affect the S1 nerve roots.   IMPRESSION: 1. Mild progression at L3-4, otherwise stable spinal degeneration affecting L3-4 and below. Anterolisthesis at L4-5 and L5-S1. 2. Spinal stenosis that is severe at L4-5 and moderate at L3-4. 3. L5-S1 bilateral subarticular recess narrowing encroaching on the descending S1 nerve roots.     Electronically Signed   By: Monte Fantasia M.D.   On: 10/01/2020 04:     Objective:  VS:  HT:    WT:   BMI:     BP:(!) 153/69  HR:90bpm  TEMP: ( )  RESP:  Physical Exam Vitals and nursing note reviewed.  Constitutional:      General: She is not in acute distress.    Appearance: Normal appearance. She is not ill-appearing.  HENT:     Head: Normocephalic and atraumatic.     Right Ear: External ear normal.     Left Ear: External ear normal.  Eyes:     Extraocular Movements: Extraocular movements intact.  Cardiovascular:     Rate and Rhythm: Normal rate.     Pulses: Normal pulses.  Pulmonary:     Effort: Pulmonary effort is normal. No respiratory distress.  Abdominal:     General: There is no distension.     Palpations: Abdomen is soft.  Musculoskeletal:        General: Tenderness present.     Cervical back: Neck supple.     Right lower leg: No edema.     Left lower leg: No edema.     Comments: Patient has good distal strength with no pain over the greater trochanters.  No clonus or focal weakness.  Skin:    Findings: No erythema, lesion or rash.  Neurological:     General: No focal deficit present.     Mental  Status: She is alert and oriented to person, place, and time.     Sensory: No sensory deficit.     Motor: No weakness or abnormal muscle tone.     Coordination: Coordination normal.  Psychiatric:        Mood and Affect: Mood normal.        Behavior: Behavior normal.     Imaging: No results found.

## 2020-10-27 NOTE — Procedures (Signed)
Lumbosacral Transforaminal Epidural Steroid Injection - Sub-Pedicular Approach with Fluoroscopic Guidance  [name]      Date of Birth: [birth year] MRN: [id number] PCP: Merrilee Seashore, MD      Visit Date: 10/16/2020   Universal Protocol:    Date/Time: 10/16/2020  Consent Given By: the patient  Position: PRONE  Additional Comments: Vital signs were monitored before and after the procedure. Patient was prepped and draped in the usual sterile fashion. The correct patient, procedure, and site was verified.   Injection Procedure Details:   Procedure diagnoses: Spinal stenosis of lumbar region with neurogenic claudication [M48.062]    Meds Administered:  Meds ordered this encounter  Medications   methylPREDNISolone acetate (DEPO-MEDROL) injection 80 mg    Laterality: Bilateral  Location/Site: L5  Needle:5.0 in., 22 ga.  Short bevel or Quincke spinal needle  Needle Placement: Transforaminal  Findings:    -Comments: Excellent flow of contrast along the nerve, nerve root and into the epidural space.  Procedure Details: After squaring off the end-plates to get a true AP view, the C-arm was positioned so that an oblique view of the foramen as noted above was visualized. The target area is just inferior to the "nose of the scotty dog" or sub pedicular. The soft tissues overlying this structure were infiltrated with 2-3 ml. of 1% Lidocaine without Epinephrine.  The spinal needle was inserted toward the target using a "trajectory" view along the fluoroscope beam.  Under AP and lateral visualization, the needle was advanced so it did not puncture dura and was located close the 6 O'Clock position of the pedical in AP tracterory. Biplanar projections were used to confirm position. Aspiration was confirmed to be negative for CSF and/or blood. A 1-2 ml. volume of Isovue-250 was injected and flow of contrast was noted at each level. Radiographs were obtained for documentation  purposes.   After attaining the desired flow of contrast documented above, a 0.5 to 1.0 ml test dose of 0.25% Marcaine was injected into each respective transforaminal space.  The patient was observed for 90 seconds post injection.  After no sensory deficits were reported, and normal lower extremity motor function was noted,   the above injectate was administered so that equal amounts of the injectate were placed at each foramen (level) into the transforaminal epidural space.   Additional Comments:  The patient tolerated the procedure well Dressing: 2 x 2 sterile gauze and Band-Aid    Post-procedure details: Patient was observed during the procedure. Post-procedure instructions were reviewed.  Patient left the clinic in stable condition.

## 2020-11-06 ENCOUNTER — Ambulatory Visit: Payer: Medicare Other | Admitting: Physical Medicine and Rehabilitation

## 2020-11-06 ENCOUNTER — Other Ambulatory Visit: Payer: Self-pay

## 2020-11-06 ENCOUNTER — Ambulatory Visit: Payer: Self-pay

## 2020-11-06 ENCOUNTER — Encounter: Payer: Self-pay | Admitting: Physical Medicine and Rehabilitation

## 2020-11-06 VITALS — BP 138/92 | HR 74

## 2020-11-06 DIAGNOSIS — M5416 Radiculopathy, lumbar region: Secondary | ICD-10-CM

## 2020-11-06 DIAGNOSIS — M25552 Pain in left hip: Secondary | ICD-10-CM | POA: Diagnosis not present

## 2020-11-06 DIAGNOSIS — Z8781 Personal history of (healed) traumatic fracture: Secondary | ICD-10-CM

## 2020-11-06 DIAGNOSIS — M48062 Spinal stenosis, lumbar region with neurogenic claudication: Secondary | ICD-10-CM

## 2020-11-06 DIAGNOSIS — M1612 Unilateral primary osteoarthritis, left hip: Secondary | ICD-10-CM | POA: Diagnosis not present

## 2020-11-06 NOTE — Progress Notes (Signed)
Megan [name] - 86 y.o. female MRN [id number]  Date of birth: [birth year]  Office Visit Note: Visit Date: 11/06/2020 PCP: Merrilee Seashore, MD Referred by: Merrilee Seashore, MD  Subjective: Chief Complaint  Patient presents with   Lower Back - Pain   Left Hip - Pain   HPI: Megan [name] is a 86 y.o. female who comes in today for evaluation of chronic, worsening and severe left-sided buttock pain radiating to groin and anterior thigh.  Patient had bilateral L5 transforaminal epidural steroid injection on 10/16/2020 and reports complete resolution of right-sided symptoms. Patient's recent lumbar MRI exhibits severe spinal stenosis at L4-L5 and bilateral subarticular recess narrowing at L5-S1 which could be encroaching on S1 nerve roots.   Patient states she is concerned that her left-sided symptoms could be related to her hip. Patient reports pain is exacerbated by activity and walking, describes as a throbbing and aching sensation lateral to anterior groin, rates as 9 out of 10.  Patient reports some relief of pain with rest and Tylenol as needed.  Patient states she had mechanical fall in June where she suffered a left intertrochanteric fracture of the femur. Patient had surgical repair by Dr. Martinique Case at [facility]. Patient's left hip x-rays do show mild degenerative changes prior to fracture. Patient states she is starting to become more active and is now able to get out and go to grocery store, however she feels the increased pain is slowing her down. She continues to use walker to help with ambulation, balance and fall prevention. Patient states she is planning on going to visit family this winter and would like to try a hip injection. Patient denies focal weakness, numbness and tingling. Patient denies recent trauma or fall.   Review of Systems  Musculoskeletal:  Positive for joint pain.  Neurological:  Negative for tingling, sensory change, focal weakness and weakness.  All other  systems reviewed and are negative. Otherwise per HPI.  Assessment & Plan: Visit Diagnoses:    ICD-10-CM   1. Pain in left hip  M25.552 XR C-ARM NO REPORT    Large Joint Inj: L hip joint    2. S/p left hip fracture  Z87.81 XR C-ARM NO REPORT    3. Unilateral primary osteoarthritis, left hip  M16.12     4. Spinal stenosis of lumbar region with neurogenic claudication  M48.062     5. Lumbar radiculopathy  M54.16        Plan: Findings:  Chronic, worsening and severe left sided buttock pain radiating to groin and anterior thigh. Previous bilateral L5 transforaminal epidural steroid injection resolved right sided symptoms, however she continues to have left sided issues. Patient's clinical presentation and exam are consistent with hip mediated pain. She does have severe pain upon internal/external rotation of left hip on exam. We do not feel this is trochanteric bursa related as she is not tender upon palpation. Patient has a history of mild degenerative changes to left hip and left intertrochanteric fracture from fall. We feel the next step is to perform diagnostic and hopefully therapeutic left intra-articular hip injection, which we did complete in the office today. Diagnostically she did get relief during the anesthetic phase of injection.Patient instructed to let us know if right sided symptoms return and also if she gets pain relief with left hip injection. No red flag symptoms noted upon exam today.    Meds & Orders: No orders of the defined types were placed in this encounter.  Orders Placed This Encounter  Procedures   Large Joint Inj: L hip joint   XR C-ARM NO REPORT    Follow-up: Return if symptoms worsen or fail to improve.   Procedures: Large Joint Inj: L hip joint on 11/06/2020 10:00 AM Indications: diagnostic evaluation and pain Details: 22 G 3.5 in needle, fluoroscopy-guided anterior approach  Arthrogram: No  Medications: 4 mL bupivacaine 0.25 %; 60 mg triamcinolone  acetonide 40 MG/ML Outcome: tolerated well, no immediate complications  There was excellent flow of contrast producing a partial arthrogram of the hip. The patient did have relief of symptoms during the anesthetic phase of the injection. Procedure, treatment alternatives, risks and benefits explained, specific risks discussed. Consent was given by the patient. Immediately prior to procedure a time out was called to verify the correct patient, procedure, equipment, support staff and site/side marked as required. Patient was prepped and draped in the usual sterile fashion.         Clinical History: MRI LUMBAR SPINE WITHOUT CONTRAST   TECHNIQUE: Multiplanar, multisequence MR imaging of the lumbar spine was performed. No intravenous contrast was administered.   COMPARISON:  11/26/2016   FINDINGS: Segmentation:  5 lumbar type vertebrae   Alignment:  Degenerative grade 1 anterolisthesis at L4-5 and L5-S1.   Vertebrae: No fracture, evidence of discitis, or bone lesion. No pars defects on a 2020 CT of the abdomen.   Conus medullaris and cauda equina: Conus extends to the L1-2 level. Conus and cauda equina appear normal.   Paraspinal and other soft tissues: Negative for perispinal mass or inflammation.   Disc levels:   T12- L1: Unremarkable.   L1-L2: Ventral spondylitic spurring   L2-L3: Facet spurring with disc narrowing and bulging. Mild left foraminal narrowing   L3-L4: Facet osteoarthritis with spurring and ligamentum flavum thickening. Circumferential disc bulging. Moderate spinal stenosis which is progressed. Mild-to-moderate bilateral foraminal narrowing   L4-L5: Facet osteoarthritis with bulky spurring and ligamentum flavum thickening. Anterolisthesis. The disc is narrowed and bulging. Severe spinal stenosis with noncompressive bilateral foraminal narrowing   L5-S1:Facet osteoarthritis with spurring and mild anterolisthesis. Disc space narrowing and bulging with  patent foramina. Bilateral subarticular recess narrowing that could affect the S1 nerve roots.   IMPRESSION: 1. Mild progression at L3-4, otherwise stable spinal degeneration affecting L3-4 and below. Anterolisthesis at L4-5 and L5-S1. 2. Spinal stenosis that is severe at L4-5 and moderate at L3-4. 3. L5-S1 bilateral subarticular recess narrowing encroaching on the descending S1 nerve roots.     Electronically Signed   By: Monte Fantasia M.D.   On: 10/01/2020 04:   She reports that she has never smoked. She has never used smokeless tobacco. No results for input(s): HGBA1C, LABURIC in the last 8760 hours.  Objective:  VS:  HT:    WT:   BMI:     BP:(!) 138/92  HR:74bpm  TEMP: ( )  RESP:  Physical Exam Vitals and nursing note reviewed.  HENT:     Head: Normocephalic and atraumatic.     Right Ear: External ear normal.     Left Ear: External ear normal.     Nose: Nose normal.     Mouth/Throat:     Mouth: Mucous membranes are moist.  Eyes:     Pupils: Pupils are equal, round, and reactive to light.  Cardiovascular:     Rate and Rhythm: Normal rate.     Pulses: Normal pulses.  Pulmonary:     Effort: Pulmonary effort is normal.  Abdominal:  General: Abdomen is flat. There is no distension.  Musculoskeletal:        General: Tenderness present.     Cervical back: Normal range of motion.     Comments: No pain upon palpation of greater trochanters. Pain noted to groin upon internal/external rotation of left hip. Tenderness noted upon palpation of underlying musculature to left paraspinal region. Ambulates with walker, gait slow.  Skin:    General: Skin is warm and dry.     Capillary Refill: Capillary refill takes less than 2 seconds.  Neurological:     General: No focal deficit present.     Mental Status: She is alert and oriented to person, place, and time.     Gait: Gait abnormal.  Psychiatric:        Mood and Affect: Mood normal.    Ortho Exam  Imaging: XR C-ARM  NO REPORT  Result Date: 11/06/2020 Please see Notes tab for imaging impression.   Past Medical/Family/Surgical/Social History: Medications & Allergies reviewed per EMR, new medications updated. Patient Active Problem List   Diagnosis Date Noted   Hypertensive retinopathy 09/13/2020   Gastroesophageal reflux disease 09/13/2020   Dyspnea on exertion 03/23/2018   Other secondary pulmonary hypertension ([place]) 03/23/2018   S/P TAVR (transcatheter aortic valve replacement) 03/03/2018   Diabetes mellitus without complication (HCC)    Hypertension    Hypothyroidism    Anterior ischemic optic neuropathy of right eye 05/28/2017   Snoring 05/28/2017   OSA (obstructive sleep apnea) 05/28/2017   Shoulder arthritis 10/29/2016   Severe aortic stenosis 06/21/2016   Lightheaded 06/05/2016   Poor balance 06/05/2016   Sensorineural hearing loss (SNHL), bilateral 02/02/2016   Arthritis of shoulder region, left, degenerative 07/14/2014   Past Medical History:  Diagnosis Date   Anemia    Arthritis    Cancer ([place]) 1989   melanoma   Diabetes mellitus without complication (HCC)    Dizziness    GERD (gastroesophageal reflux disease)    History of hiatal hernia    Hypertension    Hypothyroidism    IBS (irritable bowel syndrome)    S/P TAVR (transcatheter aortic valve replacement) 03/03/2018   s/p 23 mm Edwards Sapien THV via the TF approach   Severe aortic stenosis    Family History  Problem Relation Age of Onset   Hypertension Mother    Hypertension Father    Heart disease Father    Heart disease Brother    Past Surgical History:  Procedure Laterality Date   APPENDECTOMY     86 yrs old   CARPAL TUNNEL RELEASE Right 10/1999   Myerdierks, MD   CHOLECYSTECTOMY  89   COLONOSCOPY     EYE SURGERY Bilateral 2017   cataract surgery with lens implants   JOINT REPLACEMENT Right    knee    REVERSE SHOULDER ARTHROPLASTY Left 07/14/2014   REVERSE SHOULDER ARTHROPLASTY Left 07/14/2014    Procedure: LEFT REVERSE SHOULDER ARTHROPLASTY;  Surgeon: Meredith Pel, MD;  Location: [facility];  Service: Orthopedics;  Laterality: Left;   RIGHT/LEFT HEART CATH AND CORONARY ANGIOGRAPHY N/A 01/29/2018   Procedure: RIGHT/LEFT HEART CATH AND CORONARY ANGIOGRAPHY;  Surgeon: Belva Crome, MD;  Location: [place];  Service: Cardiovascular;  Laterality: N/A;   SHOULDER ARTHROSCOPY Left    TEE WITHOUT CARDIOVERSION N/A 03/03/2018   Procedure: TRANSESOPHAGEAL ECHOCARDIOGRAM (TEE);  Surgeon: Sherren Mocha, MD;  Location: [place] [facility];  Service: Open Heart Surgery;  Laterality: N/A;   TOTAL SHOULDER REVISION Right  10/29/2016   Procedure: RIGHT REVERSE TOTAL SHOULDER;  Surgeon: Meredith Pel, MD;  Location: [facility];  Service: Orthopedics;  Laterality: Right;   TRANSCATHETER AORTIC VALVE REPLACEMENT, TRANSFEMORAL N/A 03/03/2018   Procedure: TRANSCATHETER AORTIC VALVE REPLACEMENT, TRANSFEMORAL;  Surgeon: Sherren Mocha, MD;  Location: [place] [facility];  Service: Open Heart Surgery;  Laterality: N/A;   Social History   Occupational History   Not on file  Tobacco Use   Smoking status: Never   Smokeless tobacco: Never  Vaping Use   Vaping Use: Never used  Substance and Sexual Activity   Alcohol use: No   Drug use: No   Sexual activity: Not on file

## 2020-11-06 NOTE — Progress Notes (Signed)
Pt state lower back pain that travels to her left side. Pt state sitting and laying down the pt feels a sharp pain. Pt state she takes over the counter pain meds to help ease her pain. Pt has hx of inj 10/16/20 pt state it helped but the left side has been causes some pain.  Numeric Pain Rating Scale and Functional Assessment Average Pain 10 Pain Right Now 8 My pain is intermittent, sharp, and aching Pain is worse with: sitting, some activites, and Laying down Pain improves with: medication   In the last MONTH (on 0-10 scale) has pain interfered with the following?  1. General activity like being  able to carry out your everyday physical activities such as walking, climbing stairs, carrying groceries, or moving a chair?  Rating(7)  2. Relation with others like being able to carry out your usual social activities and roles such as  activities at home, at work and in your community. Rating(7)  3. Enjoyment of life such that you have  been bothered by emotional problems such as feeling anxious, depressed or irritable?  Rating(7)

## 2020-11-07 MED ORDER — TRIAMCINOLONE ACETONIDE 40 MG/ML IJ SUSP
60.0000 mg | INTRAMUSCULAR | Status: AC | PRN
  Administered 2020-11-06: 60 mg via INTRA_ARTICULAR

## 2020-11-07 MED ORDER — BUPIVACAINE HCL 0.25 % IJ SOLN
4.0000 mL | INTRAMUSCULAR | Status: AC | PRN
  Administered 2020-11-06: 4 mL via INTRA_ARTICULAR

## 2020-12-05 DIAGNOSIS — E782 Mixed hyperlipidemia: Secondary | ICD-10-CM | POA: Diagnosis not present

## 2020-12-12 DIAGNOSIS — N1831 Chronic kidney disease, stage 3a: Secondary | ICD-10-CM | POA: Diagnosis not present

## 2020-12-12 DIAGNOSIS — I700 Atherosclerosis of aorta: Secondary | ICD-10-CM | POA: Diagnosis not present

## 2020-12-12 DIAGNOSIS — R55 Syncope and collapse: Secondary | ICD-10-CM | POA: Diagnosis not present

## 2020-12-12 DIAGNOSIS — E782 Mixed hyperlipidemia: Secondary | ICD-10-CM | POA: Diagnosis not present

## 2020-12-12 DIAGNOSIS — I10 Essential (primary) hypertension: Secondary | ICD-10-CM | POA: Diagnosis not present

## 2020-12-12 DIAGNOSIS — E039 Hypothyroidism, unspecified: Secondary | ICD-10-CM | POA: Diagnosis not present

## 2020-12-12 DIAGNOSIS — E113293 Type 2 diabetes mellitus with mild nonproliferative diabetic retinopathy without macular edema, bilateral: Secondary | ICD-10-CM | POA: Diagnosis not present

## 2020-12-12 DIAGNOSIS — H35039 Hypertensive retinopathy, unspecified eye: Secondary | ICD-10-CM | POA: Diagnosis not present

## 2020-12-12 DIAGNOSIS — D692 Other nonthrombocytopenic purpura: Secondary | ICD-10-CM | POA: Diagnosis not present

## 2021-01-22 ENCOUNTER — Other Ambulatory Visit: Payer: Self-pay | Admitting: Physical Medicine and Rehabilitation

## 2021-02-16 DIAGNOSIS — E782 Mixed hyperlipidemia: Secondary | ICD-10-CM | POA: Diagnosis not present

## 2021-02-16 DIAGNOSIS — E118 Type 2 diabetes mellitus with unspecified complications: Secondary | ICD-10-CM | POA: Diagnosis not present

## 2021-02-16 DIAGNOSIS — I10 Essential (primary) hypertension: Secondary | ICD-10-CM | POA: Diagnosis not present

## 2021-02-16 DIAGNOSIS — E039 Hypothyroidism, unspecified: Secondary | ICD-10-CM | POA: Diagnosis not present

## 2021-02-16 DIAGNOSIS — Z8781 Personal history of (healed) traumatic fracture: Secondary | ICD-10-CM | POA: Diagnosis not present

## 2021-02-16 DIAGNOSIS — H35039 Hypertensive retinopathy, unspecified eye: Secondary | ICD-10-CM | POA: Diagnosis not present

## 2021-02-21 ENCOUNTER — Telehealth: Payer: Self-pay | Admitting: Physical Medicine and Rehabilitation

## 2021-02-21 NOTE — Telephone Encounter (Signed)
Pt is calling about update about appt. Pt states she doesn't receive call today she will be out tomorrow and asking for a detailed message to be left on her home voicemail. Please call pt back at home [phone] or cell [phone]

## 2021-02-21 NOTE — Telephone Encounter (Signed)
Pt called stating she would like an appt for a hip and back inj.  [phone]

## 2021-02-27 ENCOUNTER — Ambulatory Visit: Payer: Medicare Other | Admitting: Physical Medicine and Rehabilitation

## 2021-02-27 ENCOUNTER — Other Ambulatory Visit: Payer: Self-pay

## 2021-02-27 ENCOUNTER — Encounter: Payer: Self-pay | Admitting: Physical Medicine and Rehabilitation

## 2021-02-27 VITALS — BP 128/87 | HR 82

## 2021-02-27 DIAGNOSIS — G8929 Other chronic pain: Secondary | ICD-10-CM

## 2021-02-27 DIAGNOSIS — M5416 Radiculopathy, lumbar region: Secondary | ICD-10-CM | POA: Diagnosis not present

## 2021-02-27 DIAGNOSIS — R269 Unspecified abnormalities of gait and mobility: Secondary | ICD-10-CM | POA: Diagnosis not present

## 2021-02-27 DIAGNOSIS — M25552 Pain in left hip: Secondary | ICD-10-CM | POA: Diagnosis not present

## 2021-02-27 DIAGNOSIS — Z8781 Personal history of (healed) traumatic fracture: Secondary | ICD-10-CM

## 2021-02-27 DIAGNOSIS — M48062 Spinal stenosis, lumbar region with neurogenic claudication: Secondary | ICD-10-CM | POA: Diagnosis not present

## 2021-02-27 DIAGNOSIS — R1032 Left lower quadrant pain: Secondary | ICD-10-CM

## 2021-02-27 NOTE — Progress Notes (Signed)
Megan [name] - 87 y.o. female MRN [id number]  Date of birth: 1935/01/09  Office Visit Note: Visit Date: 02/27/2021 PCP: Merrilee Seashore, MD Referred by: Merrilee Seashore, MD  Subjective: Chief Complaint  Patient presents with   Right Hip - Pain   Lower Back - Pain   Right Leg - Pain   Left Leg - Pain   HPI: Megan [name] is a 87 y.o. female who comes in today for evaluation of chronic, worsening and severe bilateral lower back pain radiating to both legs and left sided buttock pain radiating around to groin area. Patient states pain becomes worse with walking and prolonged standing. She describes as numbness, sharp and sore sensation, currently rates as 7 out of 10. Patient reports some relief of pain with home exercise regimen, rest and use of medications. Patient has attended formal physical therapy in the past at [facility] and reports some pain relief with these treatments. Patients lumbar MRI from September 2022 exhibits multi-level facet hypertrophy, severe spinal canal stenosis at L4-L5 and moderate spinal canal stenosis at L3-L4. Patient states she had mechanical fall in June where she suffered a left intertrochanteric fracture of the femur. Patient had surgical repair by Dr. Martinique Case at [facility], states she recovered without any issues. Patient has history of multiple lumbar epidural steroid injections over the years performed by Dr. Magnus Sinning and reports good pain relief with these procedures. Patient reports significant and sustained pain relief from bilateral L5 transforaminal epidural steroid injection on 10/16/2020 until recently. Patient states she stays very active and her severe pain is making it difficult for her to perform daily tasks. Patient is currently using rolling walker to assist with ambulation and prevent falls. Patient denies focal weakness. Patient denies recent trauma or falls.    Chronic, worsening and severe left  sided buttock pain radiating around to groin. Patient reports pain is exacerbated by walking and activity. Patient describes pain as constant soreness, currently rates as 7 out of 10. Patient had left intra-articular hip injection on 11/06/2020 and reports significant pain relief with this procedure. Patient is requesting a repeat left hip injection.      Review of Systems  Musculoskeletal:  Positive for back pain and joint pain.  Neurological:  Positive for tingling. Negative for focal weakness and weakness.  All other systems reviewed and are negative. Otherwise per HPI.  Assessment & Plan: Visit Diagnoses:    ICD-10-CM   1. Lumbar radiculopathy  M54.16 Ambulatory referral to Physical Medicine Rehab    2. Spinal stenosis of lumbar region with neurogenic claudication  M48.062     3. Pain in left hip  M25.552 Ambulatory referral to Physical Medicine Rehab    4. S/p left hip fracture  Z87.81     5. Groin pain, chronic, left  R10.32    G89.29     6. Gait abnormality  R26.9        Plan: Findings:  Chronic, worsening and severe bilateral lower back pain radiating down both legs. Patient continues to have excruciating and debilitating pain despite good conservative therapy such as formal physical therapy, rest and use of medications.  Patient's clinical presentation and exam are consistent with neurogenic claudication as a result of spinal canal stenosis.  Patient does have severe spinal canal stenosis at the level of L4-L5 on recent lumbar MRI.  We believe the next step is to repeat bilateral L5 transforaminal epidural steroid injection under fluoroscopic guidance.  We  feel that we can get patient in quickly for her injection.  Patient encouraged to remain active and to continue using rolling walker to assist with ambulation and to prevent falls.  Chronic, worsening and severe left-sided buttock pain radiating around to the groin area.  Patient continues to have severe pain despite good  conservative therapy such as home exercise regimen, rest and use of medications.  Patient's clinical presentation and exam are consistent with hip mediated pain, she does have discomfort with internal/external rotation of left hip. We believe the next step is to repeat left intra-articular hip injection under fluoroscopic guidance.  If patient's pain persists after hip injection we would consider follow-up with  Martinique Miller Case, MD orthopedic specialist who performed her surgery at [facility] for formal evaluation and to discuss further treatment options. She could also see Dr. Marlou Sa whom she has seen in the past.  No red flag symptoms noted upon exam today. Our plan is to proceed with bilateral L5 transforaminal epidural steroid injection first and then 1 week later we will complete left intra-articular hip injection.    Meds & Orders: No orders of the defined types were placed in this encounter.   Orders Placed This Encounter  Procedures   Ambulatory referral to Physical Medicine Rehab    Follow-up: Return for Bilateral L5 transforaminal ESI, 1 week later left intra-articular hip injection.   Procedures: No procedures performed      Clinical History: MRI LUMBAR SPINE WITHOUT CONTRAST   TECHNIQUE: Multiplanar, multisequence MR imaging of the lumbar spine was performed. No intravenous contrast was administered.   COMPARISON:  11/26/2016   FINDINGS: Segmentation:  5 lumbar type vertebrae   Alignment:  Degenerative grade 1 anterolisthesis at L4-5 and L5-S1.   Vertebrae: No fracture, evidence of discitis, or bone lesion. No pars defects on a 2020 CT of the abdomen.   Conus medullaris and cauda equina: Conus extends to the L1-2 level. Conus and cauda equina appear normal.   Paraspinal and other soft tissues: Negative for perispinal mass or inflammation.   Disc levels:   T12- L1: Unremarkable.   L1-L2: Ventral spondylitic spurring   L2-L3: Facet spurring with disc narrowing  and bulging. Mild left foraminal narrowing   L3-L4: Facet osteoarthritis with spurring and ligamentum flavum thickening. Circumferential disc bulging. Moderate spinal stenosis which is progressed. Mild-to-moderate bilateral foraminal narrowing   L4-L5: Facet osteoarthritis with bulky spurring and ligamentum flavum thickening. Anterolisthesis. The disc is narrowed and bulging. Severe spinal stenosis with noncompressive bilateral foraminal narrowing   L5-S1:Facet osteoarthritis with spurring and mild anterolisthesis. Disc space narrowing and bulging with patent foramina. Bilateral subarticular recess narrowing that could affect the S1 nerve roots.   IMPRESSION: 1. Mild progression at L3-4, otherwise stable spinal degeneration affecting L3-4 and below. Anterolisthesis at L4-5 and L5-S1. 2. Spinal stenosis that is severe at L4-5 and moderate at L3-4. 3. L5-S1 bilateral subarticular recess narrowing encroaching on the descending S1 nerve roots.     Electronically Signed   By: Monte Fantasia M.D.   On: 10/01/2020 04:   She reports that she has never smoked. She has never used smokeless tobacco. No results for input(s): HGBA1C, LABURIC in the last 8760 hours.  Objective:  VS:  HT:     WT:    BMI:      BP:128/87   HR:82bpm   TEMP: ( )   RESP:  Physical Exam Vitals and nursing note reviewed.  HENT:     Head: Normocephalic and  atraumatic.     Right Ear: External ear normal.     Left Ear: External ear normal.     Nose: Nose normal.     Mouth/Throat:     Mouth: Mucous membranes are moist.  Eyes:     Extraocular Movements: Extraocular movements intact.  Cardiovascular:     Rate and Rhythm: Normal rate.     Pulses: Normal pulses.  Pulmonary:     Effort: Pulmonary effort is normal.  Abdominal:     General: Abdomen is flat. There is no distension.  Musculoskeletal:        General: Tenderness present.     Cervical back: Normal range of motion.     Comments: Pt is slow to rise from  seated position to standing. Good lumbar range of motion. Strong distal strength without clonus, no pain upon palpation of greater trochanters. Sensation intact bilaterally. Pain noted upon internal/external rotation of left hip. Ambulates with rolling walker, gait slow and unsteady.  Skin:    General: Skin is warm and dry.     Capillary Refill: Capillary refill takes less than 2 seconds.  Neurological:     Mental Status: She is alert and oriented to person, place, and time.     Gait: Gait abnormal.  Psychiatric:        Mood and Affect: Mood normal.        Behavior: Behavior normal.    Ortho Exam  Imaging: No results found.  Past Medical/Family/Surgical/Social History: Medications & Allergies reviewed per EMR, new medications updated. Patient Active Problem List   Diagnosis Date Noted   Hypertensive retinopathy 09/13/2020   Gastroesophageal reflux disease 09/13/2020   Dyspnea on exertion 03/23/2018   Other secondary pulmonary hypertension ([place]) 03/23/2018   S/P TAVR (transcatheter aortic valve replacement) 03/03/2018   Diabetes mellitus without complication (HCC)    Hypertension    Hypothyroidism    Anterior ischemic optic neuropathy of right eye 05/28/2017   Snoring 05/28/2017   OSA (obstructive sleep apnea) 05/28/2017   Shoulder arthritis 10/29/2016   Severe aortic stenosis 06/21/2016   Lightheaded 06/05/2016   Poor balance 06/05/2016   Sensorineural hearing loss (SNHL), bilateral 02/02/2016   Arthritis of shoulder region, left, degenerative 07/14/2014   Past Medical History:  Diagnosis Date   Anemia    Arthritis    Cancer ([place]) 1989   melanoma   Diabetes mellitus without complication (HCC)    Dizziness    GERD (gastroesophageal reflux disease)    History of hiatal hernia    Hypertension    Hypothyroidism    IBS (irritable bowel syndrome)    S/P TAVR (transcatheter aortic valve replacement) 03/03/2018   s/p 23 mm Edwards Sapien THV via the TF approach   Severe  aortic stenosis    Family History  Problem Relation Age of Onset   Hypertension Mother    Hypertension Father    Heart disease Father    Heart disease Brother    Past Surgical History:  Procedure Laterality Date   APPENDECTOMY     87 yrs old   CARPAL TUNNEL RELEASE Right 10/1999   Myerdierks, MD   CHOLECYSTECTOMY  89   COLONOSCOPY     EYE SURGERY Bilateral 2017   cataract surgery with lens implants   JOINT REPLACEMENT Right    knee    REVERSE SHOULDER ARTHROPLASTY Left 07/14/2014   REVERSE SHOULDER ARTHROPLASTY Left 07/14/2014   Procedure: LEFT REVERSE SHOULDER ARTHROPLASTY;  Surgeon: Meredith Pel, MD;  Location: [place];  Service: Orthopedics;  Laterality: Left;   RIGHT/LEFT HEART CATH AND CORONARY ANGIOGRAPHY N/A 01/29/2018   Procedure: RIGHT/LEFT HEART CATH AND CORONARY ANGIOGRAPHY;  Surgeon: Belva Crome, MD;  Location: [place] [facility];  Service: Cardiovascular;  Laterality: N/A;   SHOULDER ARTHROSCOPY Left    TEE WITHOUT CARDIOVERSION N/A 03/03/2018   Procedure: TRANSESOPHAGEAL ECHOCARDIOGRAM (TEE);  Surgeon: Sherren Mocha, MD;  Location: [place] [facility];  Service: Open Heart Surgery;  Laterality: N/A;   TOTAL SHOULDER REVISION Right 10/29/2016   Procedure: RIGHT REVERSE TOTAL SHOULDER;  Surgeon: Meredith Pel, MD;  Location: [place];  Service: Orthopedics;  Laterality: Right;   TRANSCATHETER AORTIC VALVE REPLACEMENT, TRANSFEMORAL N/A 03/03/2018   Procedure: TRANSCATHETER AORTIC VALVE REPLACEMENT, TRANSFEMORAL;  Surgeon: Sherren Mocha, MD;  Location: [place] [facility];  Service: Open Heart Surgery;  Laterality: N/A;   Social History   Occupational History   Not on file  Tobacco Use   Smoking status: Never   Smokeless tobacco: Never  Vaping Use   Vaping Use: Never used  Substance and Sexual Activity   Alcohol use: No   Drug use: No   Sexual activity: Not on file

## 2021-02-27 NOTE — Progress Notes (Signed)
Pt state buttocks pain that travels to her right hip. Pt state lower back pain that travels down both legs. Pt state walking, standing and laying down makes the pain worse. Pt state she takes pain meds to help ease her pain.  Numeric Pain Rating Scale and Functional Assessment Average Pain 9 Pain Right Now 7 My pain is intermittent and aching Pain is worse with: walking, standing, some activites, and laying down Pain improves with: medication and injections   In the last MONTH (on 0-10 scale) has pain interfered with the following?  1. General activity like being  able to carry out your everyday physical activities such as walking, climbing stairs, carrying groceries, or moving a chair?  Rating(7)  2. Relation with others like being able to carry out your usual social activities and roles such as  activities at home, at work and in your community. Rating(8)  3. Enjoyment of life such that you have  been bothered by emotional problems such as feeling anxious, depressed or irritable?  Rating(9)

## 2021-03-08 ENCOUNTER — Ambulatory Visit: Payer: Self-pay

## 2021-03-08 ENCOUNTER — Other Ambulatory Visit: Payer: Self-pay

## 2021-03-08 ENCOUNTER — Encounter: Payer: Self-pay | Admitting: Physical Medicine and Rehabilitation

## 2021-03-08 ENCOUNTER — Ambulatory Visit: Payer: Medicare Other | Admitting: Physical Medicine and Rehabilitation

## 2021-03-08 VITALS — BP 161/67 | HR 85

## 2021-03-08 DIAGNOSIS — M5416 Radiculopathy, lumbar region: Secondary | ICD-10-CM | POA: Diagnosis not present

## 2021-03-08 MED ORDER — METHYLPREDNISOLONE ACETATE 80 MG/ML IJ SUSP
80.0000 mg | Freq: Once | INTRAMUSCULAR | Status: AC
  Administered 2021-03-08: 80 mg

## 2021-03-08 NOTE — Patient Instructions (Signed)

## 2021-03-08 NOTE — Progress Notes (Signed)
Pt state buttocks pain that travels to her left hip. Pt state lower back pain that travels down both legs. Pt state walking, standing and laying down makes the pain worse. Pt state she takes pain meds to help ease her pain.  Numeric Pain Rating Scale and Functional Assessment Average Pain 5   In the last MONTH (on 0-10 scale) has pain interfered with the following?  1. General activity like being  able to carry out your everyday physical activities such as walking, climbing stairs, carrying groceries, or moving a chair?  Rating(9)   +Driver, -BT, -Dye Allergies.

## 2021-03-11 NOTE — Progress Notes (Signed)
Megan [name] - 87 y.o. female MRN [id number]  Date of birth: 09-10-1934  Office Visit Note: Visit Date: 03/08/2021 PCP: Merrilee Seashore, MD Referred by: Merrilee Seashore, MD  Subjective: Chief Complaint  Patient presents with   Lower Back - Pain   Left Thigh - Pain   Left Leg - Pain   Left Hip - Pain   Right Leg - Pain   HPI:  Megan [name] is a 87 y.o. female who comes in today at the request of Barnet Pall, FNP for planned Bilateral L5-S1 Lumbar Transforaminal epidural steroid injection with fluoroscopic guidance.  The patient has failed conservative care including home exercise, medications, time and activity modification.  This injection will be diagnostic and hopefully therapeutic.  Please see requesting physician notes for further details and justification.  ROS Otherwise per HPI.  Assessment & Plan: Visit Diagnoses:    ICD-10-CM   1. Lumbar radiculopathy  M54.16 XR C-ARM NO REPORT    Epidural Steroid injection    methylPREDNISolone acetate (DEPO-MEDROL) injection 80 mg      Plan: No additional findings.   Meds & Orders:  Meds ordered this encounter  Medications   methylPREDNISolone acetate (DEPO-MEDROL) injection 80 mg    Orders Placed This Encounter  Procedures   XR C-ARM NO REPORT   Epidural Steroid injection    Follow-up: Return if symptoms worsen or fail to improve.   Procedures: No procedures performed  Lumbosacral Transforaminal Epidural Steroid Injection - Sub-Pedicular Approach with Fluoroscopic Guidance  [name]      Date of Birth: 05-22-1934 MRN: [id number] PCP: Merrilee Seashore, MD      Visit Date: 03/08/2021   Universal Protocol:    Date/Time: 03/08/2021  Consent Given By: the patient  Position: PRONE  Additional Comments: Vital signs were monitored before and after the procedure. Patient was prepped and draped in the usual sterile fashion. The correct patient, procedure, and site was verified.   Injection  Procedure Details:   Procedure diagnoses: Lumbar radiculopathy [M54.16]    Meds Administered:  Meds ordered this encounter  Medications   methylPREDNISolone acetate (DEPO-MEDROL) injection 80 mg    Laterality: Bilateral  Location/Site: L5  Needle:5.0 in., 22 ga.  Short bevel or Quincke spinal needle  Needle Placement: Transforaminal  Findings:    -Comments: Excellent flow of contrast along the nerve, nerve root and into the epidural space.  Procedure Details: After squaring off the end-plates to get a true AP view, the C-arm was positioned so that an oblique view of the foramen as noted above was visualized. The target area is just inferior to the "nose of the scotty dog" or sub pedicular. The soft tissues overlying this structure were infiltrated with 2-3 ml. of 1% Lidocaine without Epinephrine.  The spinal needle was inserted toward the target using a "trajectory" view along the fluoroscope beam.  Under AP and lateral visualization, the needle was advanced so it did not puncture dura and was located close the 6 O'Clock position of the pedical in AP tracterory. Biplanar projections were used to confirm position. Aspiration was confirmed to be negative for CSF and/or blood. A 1-2 ml. volume of Isovue-250 was injected and flow of contrast was noted at each level. Radiographs were obtained for documentation purposes.   After attaining the desired flow of contrast documented above, a 0.5 to 1.0 ml test dose of 0.25% Marcaine was injected into each respective transforaminal space.  The patient was observed for 90 seconds post injection.  After no sensory deficits were reported, and normal lower extremity motor function was noted,   the above injectate was administered so that equal amounts of the injectate were placed at each foramen (level) into the transforaminal epidural space.   Additional Comments:  No complications occurred Dressing: 2 x 2 sterile gauze and Band-Aid     Post-procedure details: Patient was observed during the procedure. Post-procedure instructions were reviewed.  Patient left the clinic in stable condition.     Clinical History: MRI LUMBAR SPINE WITHOUT CONTRAST   TECHNIQUE: Multiplanar, multisequence MR imaging of the lumbar spine was performed. No intravenous contrast was administered.   COMPARISON:  11/26/2016   FINDINGS: Segmentation:  5 lumbar type vertebrae   Alignment:  Degenerative grade 1 anterolisthesis at L4-5 and L5-S1.   Vertebrae: No fracture, evidence of discitis, or bone lesion. No pars defects on a 2020 CT of the abdomen.   Conus medullaris and cauda equina: Conus extends to the L1-2 level. Conus and cauda equina appear normal.   Paraspinal and other soft tissues: Negative for perispinal mass or inflammation.   Disc levels:   T12- L1: Unremarkable.   L1-L2: Ventral spondylitic spurring   L2-L3: Facet spurring with disc narrowing and bulging. Mild left foraminal narrowing   L3-L4: Facet osteoarthritis with spurring and ligamentum flavum thickening. Circumferential disc bulging. Moderate spinal stenosis which is progressed. Mild-to-moderate bilateral foraminal narrowing   L4-L5: Facet osteoarthritis with bulky spurring and ligamentum flavum thickening. Anterolisthesis. The disc is narrowed and bulging. Severe spinal stenosis with noncompressive bilateral foraminal narrowing   L5-S1:Facet osteoarthritis with spurring and mild anterolisthesis. Disc space narrowing and bulging with patent foramina. Bilateral subarticular recess narrowing that could affect the S1 nerve roots.   IMPRESSION: 1. Mild progression at L3-4, otherwise stable spinal degeneration affecting L3-4 and below. Anterolisthesis at L4-5 and L5-S1. 2. Spinal stenosis that is severe at L4-5 and moderate at L3-4. 3. L5-S1 bilateral subarticular recess narrowing encroaching on the descending S1 nerve roots.     Electronically  Signed   By: Monte Fantasia M.D.   On: 10/01/2020 04:     Objective:  VS:  HT:     WT:    BMI:      BP:(!) 161/67   HR:85bpm   TEMP: ( )   RESP:  Physical Exam Vitals and nursing note reviewed.  Constitutional:      General: She is not in acute distress.    Appearance: Normal appearance. She is not ill-appearing.  HENT:     Head: Normocephalic and atraumatic.     Right Ear: External ear normal.     Left Ear: External ear normal.  Eyes:     Extraocular Movements: Extraocular movements intact.  Cardiovascular:     Rate and Rhythm: Normal rate.     Pulses: Normal pulses.  Pulmonary:     Effort: Pulmonary effort is normal. No respiratory distress.  Abdominal:     General: There is no distension.     Palpations: Abdomen is soft.  Musculoskeletal:        General: Tenderness present.     Cervical back: Neck supple.     Right lower leg: No edema.     Left lower leg: No edema.     Comments: Patient has good distal strength with no pain over the greater trochanters.  No clonus or focal weakness.  Skin:    Findings: No erythema, lesion or rash.  Neurological:     General: No focal deficit  present.     Mental Status: She is alert and oriented to person, place, and time.     Sensory: No sensory deficit.     Motor: No weakness or abnormal muscle tone.     Coordination: Coordination normal.  Psychiatric:        Mood and Affect: Mood normal.        Behavior: Behavior normal.     Imaging: No results found.

## 2021-03-11 NOTE — Procedures (Signed)
Lumbosacral Transforaminal Epidural Steroid Injection - Sub-Pedicular Approach with Fluoroscopic Guidance  [name]      Date of Birth: 1934-02-25 MRN: [id number] PCP: Merrilee Seashore, MD      Visit Date: 03/08/2021   Universal Protocol:    Date/Time: 03/08/2021  Consent Given By: the patient  Position: PRONE  Additional Comments: Vital signs were monitored before and after the procedure. Patient was prepped and draped in the usual sterile fashion. The correct patient, procedure, and site was verified.   Injection Procedure Details:   Procedure diagnoses: Lumbar radiculopathy [M54.16]    Meds Administered:  Meds ordered this encounter  Medications   methylPREDNISolone acetate (DEPO-MEDROL) injection 80 mg    Laterality: Bilateral  Location/Site: L5  Needle:5.0 in., 22 ga.  Short bevel or Quincke spinal needle  Needle Placement: Transforaminal  Findings:    -Comments: Excellent flow of contrast along the nerve, nerve root and into the epidural space.  Procedure Details: After squaring off the end-plates to get a true AP view, the C-arm was positioned so that an oblique view of the foramen as noted above was visualized. The target area is just inferior to the "nose of the scotty dog" or sub pedicular. The soft tissues overlying this structure were infiltrated with 2-3 ml. of 1% Lidocaine without Epinephrine.  The spinal needle was inserted toward the target using a "trajectory" view along the fluoroscope beam.  Under AP and lateral visualization, the needle was advanced so it did not puncture dura and was located close the 6 O'Clock position of the pedical in AP tracterory. Biplanar projections were used to confirm position. Aspiration was confirmed to be negative for CSF and/or blood. A 1-2 ml. volume of Isovue-250 was injected and flow of contrast was noted at each level. Radiographs were obtained for documentation purposes.   After attaining the desired  flow of contrast documented above, a 0.5 to 1.0 ml test dose of 0.25% Marcaine was injected into each respective transforaminal space.  The patient was observed for 90 seconds post injection.  After no sensory deficits were reported, and normal lower extremity motor function was noted,   the above injectate was administered so that equal amounts of the injectate were placed at each foramen (level) into the transforaminal epidural space.   Additional Comments:  No complications occurred Dressing: 2 x 2 sterile gauze and Band-Aid    Post-procedure details: Patient was observed during the procedure. Post-procedure instructions were reviewed.  Patient left the clinic in stable condition.

## 2021-03-15 ENCOUNTER — Other Ambulatory Visit: Payer: Self-pay

## 2021-03-15 ENCOUNTER — Encounter: Payer: Self-pay | Admitting: Physical Medicine and Rehabilitation

## 2021-03-15 ENCOUNTER — Ambulatory Visit: Payer: Self-pay

## 2021-03-15 ENCOUNTER — Ambulatory Visit: Payer: Medicare Other | Admitting: Physical Medicine and Rehabilitation

## 2021-03-15 DIAGNOSIS — M25552 Pain in left hip: Secondary | ICD-10-CM

## 2021-03-15 NOTE — Progress Notes (Signed)
Megan [name] - 87 y.o. female MRN [id number]  Date of birth: 1934-08-17  Office Visit Note: Visit Date: 03/15/2021 PCP: Merrilee Seashore, MD Referred by: Merrilee Seashore, MD  Subjective: Chief Complaint  Patient presents with   Left Hip - Pain   HPI:  Megan [name] is a 87 y.o. female who comes in today for planned repeat Left anesthetic hip arthrogram with fluoroscopic guidance.  The patient has failed conservative care including home exercise, medications, time and activity modification. Prior injection gave more than 50% relief for several months. This injection will be diagnostic and hopefully therapeutic.  Please see requesting physician notes for further details and justification.  Referring: Barnet Pall, FNP   ROS Otherwise per HPI.  Assessment & Plan: Visit Diagnoses:    ICD-10-CM   1. Pain in left hip  M25.552 Large Joint Inj: L hip joint    XR C-ARM NO REPORT      Plan: No additional findings.   Meds & Orders: No orders of the defined types were placed in this encounter.   Orders Placed This Encounter  Procedures   Large Joint Inj: L hip joint   XR C-ARM NO REPORT    Follow-up: Return if symptoms worsen or fail to improve.   Procedures: Large Joint Inj: L hip joint on 03/15/2021 10:09 AM Indications: diagnostic evaluation and pain Details: 22 G 3.5 in needle, fluoroscopy-guided anterior approach  Arthrogram: No  Medications: 4 mL bupivacaine 0.25 %; 60 mg triamcinolone acetonide 40 MG/ML Outcome: tolerated well, no immediate complications  There was excellent flow of contrast producing a partial arthrogram of the hip. The patient did have relief of symptoms during the anesthetic phase of the injection. Procedure, treatment alternatives, risks and benefits explained, specific risks discussed. Consent was given by the patient. Immediately prior to procedure a time out was called to verify the correct patient, procedure, equipment, support staff and  site/side marked as required. Patient was prepped and draped in the usual sterile fashion.         Clinical History: MRI LUMBAR SPINE WITHOUT CONTRAST   TECHNIQUE: Multiplanar, multisequence MR imaging of the lumbar spine was performed. No intravenous contrast was administered.   COMPARISON:  11/26/2016   FINDINGS: Segmentation:  5 lumbar type vertebrae   Alignment:  Degenerative grade 1 anterolisthesis at L4-5 and L5-S1.   Vertebrae: No fracture, evidence of discitis, or bone lesion. No pars defects on a 2020 CT of the abdomen.   Conus medullaris and cauda equina: Conus extends to the L1-2 level. Conus and cauda equina appear normal.   Paraspinal and other soft tissues: Negative for perispinal mass or inflammation.   Disc levels:   T12- L1: Unremarkable.   L1-L2: Ventral spondylitic spurring   L2-L3: Facet spurring with disc narrowing and bulging. Mild left foraminal narrowing   L3-L4: Facet osteoarthritis with spurring and ligamentum flavum thickening. Circumferential disc bulging. Moderate spinal stenosis which is progressed. Mild-to-moderate bilateral foraminal narrowing   L4-L5: Facet osteoarthritis with bulky spurring and ligamentum flavum thickening. Anterolisthesis. The disc is narrowed and bulging. Severe spinal stenosis with noncompressive bilateral foraminal narrowing   L5-S1:Facet osteoarthritis with spurring and mild anterolisthesis. Disc space narrowing and bulging with patent foramina. Bilateral subarticular recess narrowing that could affect the S1 nerve roots.   IMPRESSION: 1. Mild progression at L3-4, otherwise stable spinal degeneration affecting L3-4 and below. Anterolisthesis at L4-5 and L5-S1. 2. Spinal stenosis that is severe at L4-5 and moderate at L3-4. 3. L5-S1 bilateral  subarticular recess narrowing encroaching on the descending S1 nerve roots.     Electronically Signed   By: Monte Fantasia M.D.   On: 10/01/2020 04:      Objective:  VS:  HT:     WT:    BMI:      BP:    HR: bpm   TEMP: ( )   RESP:  Physical Exam   Imaging: No results found.

## 2021-03-15 NOTE — Progress Notes (Signed)
Pt state left hip pain. Pt state walking, standing and laying down makes the pain worse. Pt state she takes pain meds to help ease her pain.  Numeric Pain Rating Scale and Functional Assessment Average Pain 8   In the last MONTH (on 0-10 scale) has pain interfered with the following?  1. General activity like being  able to carry out your everyday physical activities such as walking, climbing stairs, carrying groceries, or moving a chair?  Rating(10)   +Driver, -BT, -Dye Allergies.

## 2021-03-21 NOTE — Progress Notes (Signed)
Date:  03/26/2021   ID:  ERIKO ECONOMOS, DOB [birth year], MRN [id number]  Provider Location: Office  PCP:  Merrilee Seashore, MD  Cardiologist:   Johnsie Cancel Electrophysiologist:  None   Evaluation Performed:  Follow-Up Visit  Chief Complaint:  AS/TAVR  History of Present Illness:    87 y.o. with severe AS Successful TAVR with 23 mm Sapien 3 TF approach 03/03/18. Post op echo with mean gradient elevated 19 mmHg No PVL Has seen Dr Lamonte Sakai for pulmonary HTN He hopes this improves with Rx of her left sided valve disease Cath done 01/29/18 prior to TAVR no CAD. Measured PA pressure on right heart cath only 33/12 with mean 22 mmHg. Mean PCWP 10 mmHg No CAD  No complaints Husband passed 7 years ago had been married 36 years Son lives with her and helps out Activity limited by arthritis in knees and back   Post TAVR echo 09/30/19 MS mean gradient 11 MVA 2 cm2 MAC with mild MR Not surgical candidate stable TAVR valve no AR mean gradient 15 mmHg peak 25 mmHg DVI 0.43 AVA 1.3 cm2   ECG with chronic RBBB  Monitor 04/09/19 with average HR 77 bpm <1% PAC/PVC''s   Has had both shoulders operated on and follows with Dr Marlou Sa Also getting left hip injections  Long discussion about baroreceptor vagal responses as she feels this may contribute to her dizziness. Discussed that PPM wouldn't help this and offered another monitor but not clear to me that her dizzy spells are related and ECG stable with no evidence of higher grade block   Past Medical History:  Diagnosis Date   Anemia    Arthritis    Cancer ([place]) 1989   melanoma   Diabetes mellitus without complication (HCC)    Dizziness    GERD (gastroesophageal reflux disease)    History of hiatal hernia    Hypertension    Hypothyroidism    IBS (irritable bowel syndrome)    S/P TAVR (transcatheter aortic valve replacement) 03/03/2018   s/p 23 mm Edwards Sapien THV via the TF approach   Severe aortic stenosis    Past Surgical History:  Procedure  Laterality Date   APPENDECTOMY     87 yrs old   CARPAL TUNNEL RELEASE Right 10/1999   Myerdierks, MD   CHOLECYSTECTOMY  89   COLONOSCOPY     EYE SURGERY Bilateral 2017   cataract surgery with lens implants   JOINT REPLACEMENT Right    knee    REVERSE SHOULDER ARTHROPLASTY Left 07/14/2014   REVERSE SHOULDER ARTHROPLASTY Left 07/14/2014   Procedure: LEFT REVERSE SHOULDER ARTHROPLASTY;  Surgeon: Meredith Pel, MD;  Location: [facility];  Service: Orthopedics;  Laterality: Left;   RIGHT/LEFT HEART CATH AND CORONARY ANGIOGRAPHY N/A 01/29/2018   Procedure: RIGHT/LEFT HEART CATH AND CORONARY ANGIOGRAPHY;  Surgeon: Belva Crome, MD;  Location: [facility];  Service: Cardiovascular;  Laterality: N/A;   SHOULDER ARTHROSCOPY Left    TEE WITHOUT CARDIOVERSION N/A 03/03/2018   Procedure: TRANSESOPHAGEAL ECHOCARDIOGRAM (TEE);  Surgeon: Sherren Mocha, MD;  Location: [place] [facility];  Service: Open Heart Surgery;  Laterality: N/A;   TOTAL SHOULDER REVISION Right 10/29/2016   Procedure: RIGHT REVERSE TOTAL SHOULDER;  Surgeon: Meredith Pel, MD;  Location: [facility];  Service: Orthopedics;  Laterality: Right;   TRANSCATHETER AORTIC VALVE REPLACEMENT, TRANSFEMORAL N/A 03/03/2018   Procedure: TRANSCATHETER AORTIC VALVE REPLACEMENT, TRANSFEMORAL;  Surgeon: Sherren Mocha, MD;  Location: [place] [facility];  Service: Open Heart Surgery;  Laterality: N/A;     Current Meds  Medication Sig   ACCU-CHEK AVIVA PLUS test strip 1 each by Other route 2 (two) times daily.    ACCU-CHEK SOFTCLIX LANCETS lancets 1 each by Other route 2 (two) times daily.    acetaminophen (TYLENOL) 500 MG tablet Take 500-1,000 mg by mouth every 6 (six) hours as needed (pain.).   amLODipine-valsartan (EXFORGE) 5-160 MG tablet Take 1 tablet by mouth daily.   aspirin 81 MG chewable tablet Chew 1 tablet (81 mg total) by mouth daily.   calcium-vitamin D (OSCAL WITH D) 500-200 MG-UNIT tablet Take 1 tablet by mouth daily at 12 noon.     econazole nitrate 1 % cream Apply 1 application topically daily. For nose/corners of mouth   Ergocalciferol (VITAMIN D2 PO) Take 2,000 mg by mouth.   gabapentin (NEURONTIN) 100 MG capsule TAKE 1 CAPSULE (100 MG TOTAL) BY MOUTH THREE TIMES DAILY.   glimepiride (AMARYL) 2 MG tablet Take 2 mg by mouth See admin instructions. Take 2 mg by mouth in the morning and 4 mg by mouth at night.   hydrocortisone 2.5 % cream Apply 1 application topically daily. Applied to nose/corners of mouth   Multiple Vitamin (MULTIVITAMIN WITH MINERALS) TABS tablet Take 1 tablet by mouth daily at 12 noon.    MYRBETRIQ 50 MG TB24 tablet Take 50 mg by mouth daily.   Omega-3 Fatty Acids (FISH OIL) 1200 MG CAPS Take 1,200 mg by mouth daily at 12 noon.    rosuvastatin (CRESTOR) 5 MG tablet Take 5 mg by mouth daily.   sitaGLIPtin (JANUVIA) 100 MG tablet Take 100 mg by mouth daily.   SYNTHROID 125 MCG tablet Take 125 mcg by mouth daily before breakfast. Take 5 days a week   triamterene-hydrochlorothiazide (DYAZIDE) 37.5-25 MG per capsule Take 1 capsule by mouth at bedtime.     Allergies:   Patient has no known allergies.   Social History   Tobacco Use   Smoking status: Never   Smokeless tobacco: Never  Vaping Use   Vaping Use: Never used  Substance Use Topics   Alcohol use: No   Drug use: No     Family Hx: The patient's family history includes Heart disease in her brother and father; Hypertension in her father and mother.  ROS:   Please see the history of present illness.     All other systems reviewed and are negative.   Prior CV studies:   The following studies were reviewed today:  Echo 04/09/18 EF >65% trivial PVL mean gradient 16 mmHg peak 33 mmHg Echo 03/10/19 see HPI Monitor  04/21/19   Labs/Other Tests and Data Reviewed:    EKG:  SR rate 73 RBBB  03/26/2021   Recent Labs: No results found for requested labs within last 8760 hours.   Recent Lipid Panel No results found for: CHOL, TRIG, HDL,  CHOLHDL, LDLCALC, LDLDIRECT  Wt Readings from Last 3 Encounters:  03/30/20 206 lb (93.4 kg)  09/29/19 199 lb (90.3 kg)  04/05/19 195 lb 8 oz (88.7 kg)     Objective:    Vital Signs:  Ht _0  (1.626 m)    LMP  (LMP Unknown)    BMI 35.36 kg/m    Affect appropriate Healthy:  appears stated age 55: normal Neck supple with no adenopathy JVP normal no bruits no thyromegaly Lungs clear with no wheezing and good diaphragmatic motion Heart:  S1/S2 SEM no AR  murmur, no rub,  gallop or click PMI normal Abdomen: benighn, BS positve, no tenderness, no AAA no bruit.  No HSM or HJR Distal pulses intact with no bruits No edema Neuro non-focal Skin warm and dry No muscular weakness   ASSESSMENT & PLAN:    AS/TAVR:  03/01/18 23 mm Edwards Sapien 3 DAT SBE prophylaxis. Gradients elevated from insertion date DVI ok 0.43 mean gradient 16 peak 25 AVA 1.3 cm2 By TTE 09/30/19 will update echo   MS:  MAC with mean gradient 11 mmHg at HR 75 bpm  MVA 2 cm2 mild MR Not surgical candidate   HTN:  Well controlled.  Continue current medications and low sodium Dash type diet.  Currently on Dyazide and exforge   Thyroid:  On replacement TSH normal   DM:  Discussed low carb diet.  Target hemoglobin A1c is 6.5 or less.  Continue current medications.  HLD:  On crestor f/u labs with primary   Otho:  Post fall with displaced clavicular fracture F/U Dr Marlou Sa  Dizziness:  infrequent episodes RBBB no change in ECG and previous monitor with no AV block or arrhythmia ? Vagal/baroreceptor involvement would be hard to diagnose and PPM would n't help if vasodepressor response     COVID-19 Education: The signs and symptoms of COVID-19 were discussed with the patient and how to seek care for testing (follow up with PCP or arrange E-visit).  The importance of social distancing was discussed today.   Medication Adjustments/Labs and Tests Ordered: Current medicines are reviewed at length with the patient today.   Concerns regarding medicines are outlined above.   Tests Ordered:  Echo for TAVR   Medication Changes:  None   Disposition:  Follow up with cardiology in  A year   Signed, Jenkins Rouge, MD  03/26/2021 9:00 AM    [place]

## 2021-03-25 MED ORDER — BUPIVACAINE HCL 0.25 % IJ SOLN
4.0000 mL | INTRAMUSCULAR | Status: AC | PRN
  Administered 2021-03-15: 4 mL via INTRA_ARTICULAR

## 2021-03-25 MED ORDER — TRIAMCINOLONE ACETONIDE 40 MG/ML IJ SUSP
60.0000 mg | INTRAMUSCULAR | Status: AC | PRN
  Administered 2021-03-15: 60 mg via INTRA_ARTICULAR

## 2021-03-26 ENCOUNTER — Other Ambulatory Visit: Payer: Self-pay | Admitting: Physical Medicine and Rehabilitation

## 2021-03-26 ENCOUNTER — Ambulatory Visit: Payer: Medicare Other | Admitting: Cardiovascular Disease

## 2021-03-26 ENCOUNTER — Other Ambulatory Visit: Payer: Self-pay

## 2021-03-26 ENCOUNTER — Encounter: Payer: Self-pay | Admitting: Cardiovascular Disease

## 2021-03-26 VITALS — BP 102/62 | HR 73 | Ht 64.0 in | Wt 191.0 lb

## 2021-03-26 DIAGNOSIS — R42 Dizziness and giddiness: Secondary | ICD-10-CM

## 2021-03-26 DIAGNOSIS — I059 Rheumatic mitral valve disease, unspecified: Secondary | ICD-10-CM | POA: Diagnosis not present

## 2021-03-26 DIAGNOSIS — Z952 Presence of prosthetic heart valve: Secondary | ICD-10-CM | POA: Diagnosis not present

## 2021-03-26 NOTE — Patient Instructions (Signed)
Medication Instructions:  ?Your physician recommends that you continue on your current medications as directed. Please refer to the Current Medication list given to you today. ? ?*If you need a refill on your cardiac medications before your next appointment, please call your pharmacy* ? ?Lab Work: ?If you have labs (blood work) drawn today and your tests are completely normal, you will receive your results only by: ?MyChart Message (if you have MyChart) OR ?A paper copy in the mail ?If you have any lab test that is abnormal or we need to change your treatment, we will call you to review the results. ? ?Testing/Procedures: ?Your physician has requested that you have an echocardiogram. Echocardiography is a painless test that uses sound waves to create images of your heart. It provides your doctor with information about the size and shape of your heart and how well your heart?s chambers and valves are working. This procedure takes approximately one hour. There are no restrictions for this procedure. ? ?Follow-Up: ?At [facility], you and your health needs are our priority.  As part of our continuing mission to provide you with exceptional heart care, we have created designated Provider Care Teams.  These Care Teams include your primary Cardiologist (physician) and Advanced Practice Providers (APPs -  Physician Assistants and Nurse Practitioners) who all work together to provide you with the care you need, when you need it. ? ?We recommend signing up for the patient portal called "MyChart".  Sign up information is provided on this After Visit Summary.  MyChart is used to connect with patients for Virtual Visits (Telemedicine).  Patients are able to view lab/test results, encounter notes, upcoming appointments, etc.  Non-urgent messages can be sent to your provider as well.   ?To learn more about what you can do with MyChart, go to [email or web address].   ? ?Your next appointment:   ?1 year(s) ? ?The format for  your next appointment:   ?In Person ? ?Provider:   ?Peter Nishan, MD { ? ?

## 2021-04-09 ENCOUNTER — Ambulatory Visit (HOSPITAL_COMMUNITY): Payer: Medicare Other | Attending: Internal Medicine

## 2021-04-09 ENCOUNTER — Other Ambulatory Visit: Payer: Self-pay

## 2021-04-09 DIAGNOSIS — I059 Rheumatic mitral valve disease, unspecified: Secondary | ICD-10-CM | POA: Diagnosis not present

## 2021-04-09 DIAGNOSIS — Z952 Presence of prosthetic heart valve: Secondary | ICD-10-CM | POA: Diagnosis not present

## 2021-04-09 LAB — ECHOCARDIOGRAM COMPLETE
AR max vel: 2.39 cm2
AV Area VTI: 2.62 cm2
AV Area mean vel: 2.14 cm2
AV Mean grad: 10 mmHg
AV Peak grad: 17.5 mmHg
Ao pk vel: 2.09 m/s
Area-P 1/2: 2.24 cm2
S' Lateral: 2.2 cm

## 2021-04-12 ENCOUNTER — Telehealth: Payer: Self-pay | Admitting: Cardiovascular Disease

## 2021-04-12 NOTE — Telephone Encounter (Signed)
° °  Pt is returning call to get echo result °

## 2021-04-12 NOTE — Telephone Encounter (Signed)
Josue Hector, MD  [name], RN ?EF normal TAVR valve looks good still with some mitral valve problems but no fix for that  ? ?Informed patient of echo results. Patient verbalized understanding. ?

## 2021-05-17 DIAGNOSIS — H35033 Hypertensive retinopathy, bilateral: Secondary | ICD-10-CM | POA: Diagnosis not present

## 2021-05-17 DIAGNOSIS — E113593 Type 2 diabetes mellitus with proliferative diabetic retinopathy without macular edema, bilateral: Secondary | ICD-10-CM | POA: Diagnosis not present

## 2021-05-17 DIAGNOSIS — H47011 Ischemic optic neuropathy, right eye: Secondary | ICD-10-CM | POA: Diagnosis not present

## 2021-05-17 DIAGNOSIS — H35373 Puckering of macula, bilateral: Secondary | ICD-10-CM | POA: Diagnosis not present

## 2021-05-17 DIAGNOSIS — H16223 Keratoconjunctivitis sicca, not specified as Sjogren's, bilateral: Secondary | ICD-10-CM | POA: Diagnosis not present

## 2021-05-17 DIAGNOSIS — H04123 Dry eye syndrome of bilateral lacrimal glands: Secondary | ICD-10-CM | POA: Diagnosis not present

## 2021-05-17 DIAGNOSIS — H35363 Drusen (degenerative) of macula, bilateral: Secondary | ICD-10-CM | POA: Diagnosis not present

## 2021-05-21 DIAGNOSIS — E039 Hypothyroidism, unspecified: Secondary | ICD-10-CM | POA: Diagnosis not present

## 2021-05-21 DIAGNOSIS — I700 Atherosclerosis of aorta: Secondary | ICD-10-CM | POA: Diagnosis not present

## 2021-05-21 DIAGNOSIS — N1831 Chronic kidney disease, stage 3a: Secondary | ICD-10-CM | POA: Diagnosis not present

## 2021-05-21 DIAGNOSIS — D692 Other nonthrombocytopenic purpura: Secondary | ICD-10-CM | POA: Diagnosis not present

## 2021-05-21 DIAGNOSIS — E113293 Type 2 diabetes mellitus with mild nonproliferative diabetic retinopathy without macular edema, bilateral: Secondary | ICD-10-CM | POA: Diagnosis not present

## 2021-05-21 DIAGNOSIS — E782 Mixed hyperlipidemia: Secondary | ICD-10-CM | POA: Diagnosis not present

## 2021-05-21 DIAGNOSIS — Z0000 Encounter for general adult medical examination without abnormal findings: Secondary | ICD-10-CM | POA: Diagnosis not present

## 2021-05-21 DIAGNOSIS — I10 Essential (primary) hypertension: Secondary | ICD-10-CM | POA: Diagnosis not present

## 2021-05-26 ENCOUNTER — Other Ambulatory Visit: Payer: Self-pay | Admitting: Physical Medicine and Rehabilitation

## 2021-05-30 DIAGNOSIS — E039 Hypothyroidism, unspecified: Secondary | ICD-10-CM | POA: Diagnosis not present

## 2021-05-30 DIAGNOSIS — E113293 Type 2 diabetes mellitus with mild nonproliferative diabetic retinopathy without macular edema, bilateral: Secondary | ICD-10-CM | POA: Diagnosis not present

## 2021-05-30 DIAGNOSIS — I2510 Atherosclerotic heart disease of native coronary artery without angina pectoris: Secondary | ICD-10-CM | POA: Diagnosis not present

## 2021-05-30 DIAGNOSIS — N3281 Overactive bladder: Secondary | ICD-10-CM | POA: Diagnosis not present

## 2021-05-30 DIAGNOSIS — I10 Essential (primary) hypertension: Secondary | ICD-10-CM | POA: Diagnosis not present

## 2021-05-30 DIAGNOSIS — E782 Mixed hyperlipidemia: Secondary | ICD-10-CM | POA: Diagnosis not present

## 2021-05-30 DIAGNOSIS — D692 Other nonthrombocytopenic purpura: Secondary | ICD-10-CM | POA: Diagnosis not present

## 2021-05-30 DIAGNOSIS — I700 Atherosclerosis of aorta: Secondary | ICD-10-CM | POA: Diagnosis not present

## 2021-05-30 DIAGNOSIS — N1831 Chronic kidney disease, stage 3a: Secondary | ICD-10-CM | POA: Diagnosis not present

## 2021-05-30 DIAGNOSIS — H35039 Hypertensive retinopathy, unspecified eye: Secondary | ICD-10-CM | POA: Diagnosis not present

## 2021-05-30 DIAGNOSIS — Z23 Encounter for immunization: Secondary | ICD-10-CM | POA: Diagnosis not present

## 2021-06-26 ENCOUNTER — Telehealth: Payer: Self-pay | Admitting: Physical Medicine and Rehabilitation

## 2021-06-26 NOTE — Telephone Encounter (Signed)
Is having some pain flares in her back. She has been in a high level of pain since last Friday. Please advise

## 2021-06-28 ENCOUNTER — Ambulatory Visit: Payer: Medicare Other | Admitting: Physical Medicine and Rehabilitation

## 2021-06-28 ENCOUNTER — Encounter: Payer: Self-pay | Admitting: Physical Medicine and Rehabilitation

## 2021-06-28 DIAGNOSIS — S29012A Strain of muscle and tendon of back wall of thorax, initial encounter: Secondary | ICD-10-CM

## 2021-06-28 DIAGNOSIS — M546 Pain in thoracic spine: Secondary | ICD-10-CM | POA: Diagnosis not present

## 2021-06-28 DIAGNOSIS — R269 Unspecified abnormalities of gait and mobility: Secondary | ICD-10-CM

## 2021-06-28 MED ORDER — METHOCARBAMOL 500 MG PO TABS: 500.0000 mg | ORAL_TABLET | Freq: Three times a day (TID) | ORAL | 0 refills | Status: DC

## 2021-06-28 MED ORDER — TRAMADOL HCL 50 MG PO TABS: 50.0000 mg | ORAL_TABLET | Freq: Three times a day (TID) | ORAL | 0 refills | Status: DC | PRN

## 2021-06-28 NOTE — Progress Notes (Signed)
Megan [name] - 87 y.o. female MRN [id number]  Date of birth: 06-22-1934  Office Visit Note: Visit Date: 06/28/2021 PCP: Merrilee Seashore, MD Referred by: Merrilee Seashore, MD  Subjective: Chief Complaint  Patient presents with   Middle Back - Pain    NKI, was working with Paper shredder for couple hours on Thursday, flared up and it bothers her to sit, and she can put her arm on top of head and it relieves the pain, able to sleep on right side, Pain is on the left side from shoulder blade to the spine, occasional pain under the left breast that comes and goes quickly. Onset x 6 days but it is bad, walking doesn't seem to bother it.   HPI: Megan [name] is a 87 y.o. female who comes in today for evaluation of acute bilateral thoracic back pain, left greater than right. Patient reports pain started on Friday 5/26 after using paper shredder for several hours the day prior. Patient states pain is constant and is not reporting any aggravating factors at this time. She describes pain as a constant sore, tight and aching sensation, some relief of pain when lifting left arm and with sleeping on right side. Patient also reports some relief of pain with hot showers, heating pad, IcyHot  and Tylenol as needed. Patient states current pain feels significantly different than previous issues, states she is concerned that she pulled a muscle. No history of thoracic MRI imaging. We have treated patient in the past for chronic lower back and left hip issues, last injection performed in our office was left intra-articular hip injection in February of this year. History of left comminuted and displaced intertrochanteric left proximal femur fracture in 2022, repaired by Dr. Martinique Case with [facility]. Patient currently using rolling walker to assist with ambulation and prevent falls. Patient denies focal weakness, numbness and tingling. Patient denies recent trauma or falls.   Review of  Systems  Musculoskeletal:  Positive for back pain and myalgias.  Neurological:  Negative for tingling, sensory change, focal weakness and weakness.  All other systems reviewed and are negative. Otherwise per HPI.  Assessment & Plan: Visit Diagnoses:    ICD-10-CM   1. Acute bilateral thoracic back pain  M54.6     2. Strain of thoracic back region  S29.012A     3. Gait abnormality  R26.9        Plan: Findings:  Acute bilateral thoracic back pain. Patient continues to have severe pain despite good conservative therapies such as rest, hot showers, heating pad and medications. Patients clinical presentation and exam are consistent with thoracic back strain. We believe the next step is to continue with conservative therapies at home, I also prescribed both Tramadol and Robaxin to help with pain and muscle spasms. I would like to have patient follow up in approximately 4 weeks for re-evaluation. If her pain persists I did discuss re-grouping with physical therapy and possibly obtaining thoracic MRI imaging. Patient encouraged to remain active as tolerated. No red flag symptoms noted upon exam.    Meds & Orders:  Meds ordered this encounter  Medications   methocarbamol (ROBAXIN) 500 MG tablet    Sig: Take 1 tablet (500 mg total) by mouth 3 (three) times daily.    Dispense:  90 tablet    Refill:  0    Order Specific Question:   Supervising Provider    Answer:   Magnus Sinning [[id number]]   traMADol (ULTRAM) 61  MG tablet    Sig: Take 1 tablet (50 mg total) by mouth every 8 (eight) hours as needed.    Dispense:  30 tablet    Refill:  0    Order Specific Question:   Supervising Provider    Answer:   Magnus Sinning [[id number]]   No orders of the defined types were placed in this encounter.   Follow-up: Return for 4 week follow up for re-evaluation.   Procedures: No procedures performed      Clinical History: No specialty comments available.   She reports that she has never smoked. She  has never used smokeless tobacco. No results for input(s): HGBA1C, LABURIC in the last 8760 hours.  Objective:  VS:  HT:    WT:   BMI:     BP:   HR: bpm  TEMP: ( )  RESP:  Physical Exam Vitals and nursing note reviewed.  HENT:     Head: Normocephalic and atraumatic.     Right Ear: External ear normal.     Left Ear: External ear normal.     Nose: Nose normal.     Mouth/Throat:     Mouth: Mucous membranes are moist.  Eyes:     Extraocular Movements: Extraocular movements intact.  Cardiovascular:     Rate and Rhythm: Normal rate.     Pulses: Normal pulses.  Pulmonary:     Effort: Pulmonary effort is normal.  Abdominal:     General: Abdomen is flat. There is no distension.  Musculoskeletal:        General: Tenderness present.     Cervical back: Normal range of motion.     Comments: Pt is slow to rise from seated position to standing. Good lumbar range of motion. Strong distal strength without clonus, no pain upon palpation of greater trochanters. Sensation intact bilaterally. No pain upon palpation of bilateral thoracic region, tightness noted to bilateral thoracic paraspinal muscles.  Ambulates with rolling walker, gait slow and unsteady.   Skin:    General: Skin is warm and dry.     Capillary Refill: Capillary refill takes less than 2 seconds.  Neurological:     Mental Status: She is alert and oriented to person, place, and time.     Gait: Gait abnormal.  Psychiatric:        Mood and Affect: Mood normal.        Behavior: Behavior normal.    Ortho Exam  Imaging: No results found.  Past Medical/Family/Surgical/Social History: Medications & Allergies reviewed per EMR, new medications updated. Patient Active Problem List   Diagnosis Date Noted   Hypertensive retinopathy 09/13/2020   Gastroesophageal reflux disease 09/13/2020   Dyspnea on exertion 03/23/2018   Other secondary pulmonary hypertension ([place]) 03/23/2018   S/P TAVR (transcatheter aortic valve replacement)  03/03/2018   Diabetes mellitus without complication (HCC)    Hypertension    Hypothyroidism    Anterior ischemic optic neuropathy of right eye 05/28/2017   Snoring 05/28/2017   OSA (obstructive sleep apnea) 05/28/2017   Shoulder arthritis 10/29/2016   Severe aortic stenosis 06/21/2016   Lightheaded 06/05/2016   Poor balance 06/05/2016   Sensorineural hearing loss (SNHL), bilateral 02/02/2016   Arthritis of shoulder region, left, degenerative 07/14/2014   Past Medical History:  Diagnosis Date   Anemia    Arthritis    Cancer ([place]) 1989   melanoma   Diabetes mellitus without complication (HCC)    Dizziness    GERD (gastroesophageal reflux disease)  History of hiatal hernia    Hypertension    Hypothyroidism    IBS (irritable bowel syndrome)    S/P TAVR (transcatheter aortic valve replacement) 03/03/2018   s/p 23 mm Edwards Sapien THV via the TF approach   Severe aortic stenosis    Family History  Problem Relation Age of Onset   Hypertension Mother    Hypertension Father    Heart disease Father    Heart disease Brother    Past Surgical History:  Procedure Laterality Date   APPENDECTOMY     87 yrs old   CARPAL TUNNEL RELEASE Right 10/1999   Myerdierks, MD   CHOLECYSTECTOMY  89   COLONOSCOPY     EYE SURGERY Bilateral 2017   cataract surgery with lens implants   JOINT REPLACEMENT Right    knee    REVERSE SHOULDER ARTHROPLASTY Left 07/14/2014   REVERSE SHOULDER ARTHROPLASTY Left 07/14/2014   Procedure: LEFT REVERSE SHOULDER ARTHROPLASTY;  Surgeon: Meredith Pel, MD;  Location: [facility];  Service: Orthopedics;  Laterality: Left;   RIGHT/LEFT HEART CATH AND CORONARY ANGIOGRAPHY N/A 01/29/2018   Procedure: RIGHT/LEFT HEART CATH AND CORONARY ANGIOGRAPHY;  Surgeon: Belva Crome, MD;  Location: [facility];  Service: Cardiovascular;  Laterality: N/A;   SHOULDER ARTHROSCOPY Left    TEE WITHOUT CARDIOVERSION N/A 03/03/2018   Procedure: TRANSESOPHAGEAL ECHOCARDIOGRAM  (TEE);  Surgeon: Sherren Mocha, MD;  Location: [facility];  Service: Open Heart Surgery;  Laterality: N/A;   TOTAL SHOULDER REVISION Right 10/29/2016   Procedure: RIGHT REVERSE TOTAL SHOULDER;  Surgeon: Meredith Pel, MD;  Location: [facility];  Service: Orthopedics;  Laterality: Right;   TRANSCATHETER AORTIC VALVE REPLACEMENT, TRANSFEMORAL N/A 03/03/2018   Procedure: TRANSCATHETER AORTIC VALVE REPLACEMENT, TRANSFEMORAL;  Surgeon: Sherren Mocha, MD;  Location: [facility];  Service: Open Heart Surgery;  Laterality: N/A;   Social History   Occupational History   Not on file  Tobacco Use   Smoking status: Never   Smokeless tobacco: Never  Vaping Use   Vaping Use: Never used  Substance and Sexual Activity   Alcohol use: No   Drug use: No   Sexual activity: Not on file

## 2021-06-29 ENCOUNTER — Telehealth: Payer: Self-pay | Admitting: Physical Medicine and Rehabilitation

## 2021-06-29 NOTE — Telephone Encounter (Signed)
Pt called requesting a call back. Pt had an appt yesterday and was told to make a 4 wk follow up. Please call pt back before 10 am to set appt. Pt states she will be out after 10 am. Pt phone number is [phone].

## 2021-07-09 ENCOUNTER — Other Ambulatory Visit: Payer: Self-pay | Admitting: Physical Medicine and Rehabilitation

## 2021-07-16 ENCOUNTER — Ambulatory Visit: Payer: Medicare Other | Admitting: Neurology

## 2021-07-27 ENCOUNTER — Ambulatory Visit: Payer: Medicare Other | Admitting: Physical Medicine and Rehabilitation

## 2021-07-27 ENCOUNTER — Encounter: Payer: Self-pay | Admitting: Physical Medicine and Rehabilitation

## 2021-07-27 DIAGNOSIS — R269 Unspecified abnormalities of gait and mobility: Secondary | ICD-10-CM

## 2021-07-27 DIAGNOSIS — M5416 Radiculopathy, lumbar region: Secondary | ICD-10-CM

## 2021-07-27 DIAGNOSIS — M48062 Spinal stenosis, lumbar region with neurogenic claudication: Secondary | ICD-10-CM

## 2021-07-27 DIAGNOSIS — M546 Pain in thoracic spine: Secondary | ICD-10-CM

## 2021-07-27 DIAGNOSIS — S29012A Strain of muscle and tendon of back wall of thorax, initial encounter: Secondary | ICD-10-CM

## 2021-07-27 MED ORDER — METHOCARBAMOL 500 MG PO TABS: 500.0000 mg | ORAL_TABLET | Freq: Three times a day (TID) | ORAL | 0 refills | Status: AC

## 2021-07-27 NOTE — Progress Notes (Signed)
Pt state lower back pain that travels to both knees and down to her feet. Pt state she has hip pain on her left side. Pt state she has sum pain in her neck and shoulder. Pt state getting out of bed, walking and standing makes the pain worse. Pt state she takes over the counter pain meds to help ease her pain.  Numeric Pain Rating Scale and Functional Assessment Average Pain 10 Pain Right Now 8 My pain is constant, sharp, and dull Pain is worse with: walking, bending, sitting, standing, some activites, and laying down Pain improves with: heat/ice, medication, and injections   In the last MONTH (on 0-10 scale) has pain interfered with the following?  1. General activity like being  able to carry out your everyday physical activities such as walking, climbing stairs, carrying groceries, or moving a chair?  Rating(5)  2. Relation with others like being able to carry out your usual social activities and roles such as  activities at home, at work and in your community. Rating(6)  3. Enjoyment of life such that you have  been bothered by emotional problems such as feeling anxious, depressed or irritable?  Rating(7)

## 2021-07-27 NOTE — Progress Notes (Unsigned)
Megan [name] - 87 y.o. female MRN [id number]  Date of birth: 09-14-1934  Office Visit Note: Visit Date: 07/27/2021 PCP: Merrilee Seashore, MD Referred by: Merrilee Seashore, MD  Subjective: Chief Complaint  Patient presents with   Neck - Pain   Left Shoulder - Pain   Lower Back - Pain   Left Knee - Pain   Right Knee - Pain   Left Foot - Pain   Right Foot - Pain   HPI: Megan [name] is a 87 y.o. female who comes in today for follow up of acute bilateral thoracic back pain, left greater than right. Patient states pain started approximately 1 month ago while sitting in chair shredding papers. Patient states pain worsens with activity, describes as sore and aching sensation, denies pain at this time. She reports significant relief with Robaxin and Tylenol at home. Patient states she is staying active and continues to walk several times a week. Overall, patient states her pain has significantly improved since our last visit, she is now more functional and able to return to normal daily routine.   Patient also reports chronic, worsening and severe bilateral lower back pain radiating to legs, ongoing for several years. States her pain increases with prolonged standing and walking, currently rates as 7 out of 10. Some relief of pain with home exercise regimen, rest and use of medications. Lumbar MRI imaging from 2022 exhibits multilevel facet arthropathy, moderate spinal canal stenosis at L3-L4 and severe at L4-L5. There is also bilateral subarticular recess narrowing at L5-S1 encroaching on the descending S1 nerve roots. Multiple lumbar epidural steroid injections performed in our office over the last several years, most recent was bilateral L5 transforaminal epidural steroid injection on 03/08/2021, reports significant and sustained relief of pain until recently. Patient continues to use rolling water to assist with ambulation and prevent falls. Patient denies recent trauma or falls.       Review of Systems  Musculoskeletal:  Positive for back pain and myalgias.  Neurological:  Negative for focal weakness and weakness.  All other systems reviewed and are negative.  Otherwise per HPI.  Assessment & Plan: Visit Diagnoses:    ICD-10-CM   1. Acute bilateral thoracic back pain  M54.6 Ambulatory referral to Physical Medicine Rehab    2. Strain of thoracic back region  S29.012A Ambulatory referral to Physical Medicine Rehab    3. Lumbar radiculopathy  M54.16 Ambulatory referral to Physical Medicine Rehab    4. Spinal stenosis of lumbar region with neurogenic claudication  M48.062 Ambulatory referral to Physical Medicine Rehab    5. Gait abnormality  R26.9 Ambulatory referral to Physical Medicine Rehab       Plan: Findings:  1. Acute bilateral thoracic back pain, left greater than right. Significant relief of pain with conservative therapies such as home exercise regimen, rest, and use of medications. We will continue to monitor patient, if her pain persists we did discuss re-grouping with our physical therapy team for manual treatments, core strengthening and dry needing. I did place refill for Robaxin today and instructed patient to take as needed.   2. Chronic, worsening and severe bilateral lower back pain. Pain has increased gradually over the last several weeks, no relief of discomfort with conservative therapies such as home exercise regimen, rest and use of medications. Patient's clinical presentation and exam are consistent with neurogenic claudication as a result of spinal canal stenosis. There is severe spinal canal stenosis at the level of L4-L5. We believe the  next step is to repeat bilateral L5 transforaminal epidural steroid injection under fluoroscopic guidance. Patient encouraged to remain active and continue with home exercise regimen as tolerated. No red flag symptoms noted upon exam today.     Meds & Orders:  Meds ordered this encounter  Medications    methocarbamol (ROBAXIN) 500 MG tablet    Sig: Take 1 tablet (500 mg total) by mouth 3 (three) times daily.    Dispense:  90 tablet    Refill:  0    Order Specific Question:   Supervising Provider    Answer:   Magnus Sinning [[id number]]    Orders Placed This Encounter  Procedures   Ambulatory referral to Physical Medicine Rehab    Follow-up: Return for Bilateral L5 transforaminal epidural steroid injection.   Procedures: No procedures performed      Clinical History: EXAM: MRI LUMBAR SPINE WITHOUT CONTRAST   TECHNIQUE: Multiplanar, multisequence MR imaging of the lumbar spine was performed. No intravenous contrast was administered.   COMPARISON:  11/26/2016   FINDINGS: Segmentation:  5 lumbar type vertebrae   Alignment:  Degenerative grade 1 anterolisthesis at L4-5 and L5-S1.   Vertebrae: No fracture, evidence of discitis, or bone lesion. No pars defects on a 2020 CT of the abdomen.   Conus medullaris and cauda equina: Conus extends to the L1-2 level. Conus and cauda equina appear normal.   Paraspinal and other soft tissues: Negative for perispinal mass or inflammation.   Disc levels:   T12- L1: Unremarkable.   L1-L2: Ventral spondylitic spurring   L2-L3: Facet spurring with disc narrowing and bulging. Mild left foraminal narrowing   L3-L4: Facet osteoarthritis with spurring and ligamentum flavum thickening. Circumferential disc bulging. Moderate spinal stenosis which is progressed. Mild-to-moderate bilateral foraminal narrowing   L4-L5: Facet osteoarthritis with bulky spurring and ligamentum flavum thickening. Anterolisthesis. The disc is narrowed and bulging. Severe spinal stenosis with noncompressive bilateral foraminal narrowing   L5-S1:Facet osteoarthritis with spurring and mild anterolisthesis. Disc space narrowing and bulging with patent foramina. Bilateral subarticular recess narrowing that could affect the S1 nerve roots.   IMPRESSION: 1. Mild  progression at L3-4, otherwise stable spinal degeneration affecting L3-4 and below. Anterolisthesis at L4-5 and L5-S1. 2. Spinal stenosis that is severe at L4-5 and moderate at L3-4. 3. L5-S1 bilateral subarticular recess narrowing encroaching on the descending S1 nerve roots.     Electronically Signed   By: Monte Fantasia M.D.   On: 10/01/2020 04:20   She reports that she has never smoked. She has never used smokeless tobacco. No results for input(s): "HGBA1C", "LABURIC" in the last 8760 hours.  Objective:  VS:  HT:    WT:   BMI:     BP:   HR: bpm  TEMP: ( )  RESP:  Physical Exam Vitals and nursing note reviewed.  HENT:     Head: Normocephalic and atraumatic.     Right Ear: External ear normal.     Left Ear: External ear normal.     Nose: Nose normal.     Mouth/Throat:     Mouth: Mucous membranes are moist.  Eyes:     Extraocular Movements: Extraocular movements intact.  Cardiovascular:     Rate and Rhythm: Normal rate.     Pulses: Normal pulses.  Pulmonary:     Effort: Pulmonary effort is normal.  Abdominal:     General: Abdomen is flat. There is no distension.  Musculoskeletal:        General: Tenderness present.  Cervical back: Normal range of motion.     Comments: Pt is slow to rise from seated position to standing. Good lumbar range of motion. Strong distal strength without clonus, no pain upon palpation of greater trochanters. Sensation intact bilaterally. Ambulates with rolling walker, gait slow and steady.   Skin:    General: Skin is warm and dry.     Capillary Refill: Capillary refill takes less than 2 seconds.  Neurological:     General: No focal deficit present.     Mental Status: She is alert and oriented to person, place, and time.  Psychiatric:        Mood and Affect: Mood normal.        Behavior: Behavior normal.     Ortho Exam  Imaging: No results found.  Past Medical/Family/Surgical/Social History: Medications & Allergies reviewed per  EMR, new medications updated. Patient Active Problem List   Diagnosis Date Noted   Hypertensive retinopathy 09/13/2020   Gastroesophageal reflux disease 09/13/2020   Dyspnea on exertion 03/23/2018   Other secondary pulmonary hypertension ([place]) 03/23/2018   S/P TAVR (transcatheter aortic valve replacement) 03/03/2018   Diabetes mellitus without complication (HCC)    Hypertension    Hypothyroidism    Anterior ischemic optic neuropathy of right eye 05/28/2017   Snoring 05/28/2017   OSA (obstructive sleep apnea) 05/28/2017   Shoulder arthritis 10/29/2016   Severe aortic stenosis 06/21/2016   Lightheaded 06/05/2016   Poor balance 06/05/2016   Sensorineural hearing loss (SNHL), bilateral 02/02/2016   Arthritis of shoulder region, left, degenerative 07/14/2014   Past Medical History:  Diagnosis Date   Anemia    Arthritis    Cancer ([place]) 1989   melanoma   Diabetes mellitus without complication (HCC)    Dizziness    GERD (gastroesophageal reflux disease)    History of hiatal hernia    Hypertension    Hypothyroidism    IBS (irritable bowel syndrome)    S/P TAVR (transcatheter aortic valve replacement) 03/03/2018   s/p 23 mm Edwards Sapien THV via the TF approach   Severe aortic stenosis    Family History  Problem Relation Age of Onset   Hypertension Mother    Hypertension Father    Heart disease Father    Heart disease Brother    Past Surgical History:  Procedure Laterality Date   APPENDECTOMY     87 yrs old   CARPAL TUNNEL RELEASE Right 10/1999   Myerdierks, MD   CHOLECYSTECTOMY  89   COLONOSCOPY     EYE SURGERY Bilateral 2017   cataract surgery with lens implants   JOINT REPLACEMENT Right    knee    REVERSE SHOULDER ARTHROPLASTY Left 07/14/2014   REVERSE SHOULDER ARTHROPLASTY Left 07/14/2014   Procedure: LEFT REVERSE SHOULDER ARTHROPLASTY;  Surgeon: Meredith Pel, MD;  Location: [facility];  Service: Orthopedics;  Laterality: Left;   RIGHT/LEFT HEART CATH AND  CORONARY ANGIOGRAPHY N/A 01/29/2018   Procedure: RIGHT/LEFT HEART CATH AND CORONARY ANGIOGRAPHY;  Surgeon: Belva Crome, MD;  Location: [facility];  Service: Cardiovascular;  Laterality: N/A;   SHOULDER ARTHROSCOPY Left    TEE WITHOUT CARDIOVERSION N/A 03/03/2018   Procedure: TRANSESOPHAGEAL ECHOCARDIOGRAM (TEE);  Surgeon: Sherren Mocha, MD;  Location: [facility];  Service: Open Heart Surgery;  Laterality: N/A;   TOTAL SHOULDER REVISION Right 10/29/2016   Procedure: RIGHT REVERSE TOTAL SHOULDER;  Surgeon: Meredith Pel, MD;  Location: [facility];  Service: Orthopedics;  Laterality: Right;   TRANSCATHETER  AORTIC VALVE REPLACEMENT, TRANSFEMORAL N/A 03/03/2018   Procedure: TRANSCATHETER AORTIC VALVE REPLACEMENT, TRANSFEMORAL;  Surgeon: Sherren Mocha, MD;  Location: [facility];  Service: Open Heart Surgery;  Laterality: N/A;   Social History   Occupational History   Not on file  Tobacco Use   Smoking status: Never   Smokeless tobacco: Never  Vaping Use   Vaping Use: Never used  Substance and Sexual Activity   Alcohol use: No   Drug use: No   Sexual activity: Not on file

## 2021-07-30 ENCOUNTER — Telehealth: Payer: Self-pay | Admitting: Physical Medicine and Rehabilitation

## 2021-07-30 NOTE — Telephone Encounter (Signed)
Patient called. Would like gabapentin called in for her. She will be out today. Her call back number is [phone]

## 2021-08-01 NOTE — Telephone Encounter (Signed)
Patient called in stating she is completely out of Gabapentin and needs a refill and if possible would she be able to get a 90 day supply of medication instead of a 30 day supply

## 2021-08-02 ENCOUNTER — Other Ambulatory Visit: Payer: Self-pay | Admitting: Physical Medicine and Rehabilitation

## 2021-08-02 MED ORDER — GABAPENTIN 100 MG PO CAPS: ORAL_CAPSULE | ORAL | 1 refills | Status: DC

## 2021-08-28 ENCOUNTER — Ambulatory Visit: Payer: Medicare Other | Admitting: Physical Medicine and Rehabilitation

## 2021-08-28 ENCOUNTER — Encounter: Payer: Self-pay | Admitting: Physical Medicine and Rehabilitation

## 2021-08-28 ENCOUNTER — Ambulatory Visit: Payer: Self-pay

## 2021-08-28 VITALS — BP 115/70 | HR 80

## 2021-08-28 DIAGNOSIS — M1612 Unilateral primary osteoarthritis, left hip: Secondary | ICD-10-CM | POA: Diagnosis not present

## 2021-08-28 DIAGNOSIS — Z8781 Personal history of (healed) traumatic fracture: Secondary | ICD-10-CM | POA: Diagnosis not present

## 2021-08-28 DIAGNOSIS — M48062 Spinal stenosis, lumbar region with neurogenic claudication: Secondary | ICD-10-CM | POA: Diagnosis not present

## 2021-08-28 DIAGNOSIS — M5416 Radiculopathy, lumbar region: Secondary | ICD-10-CM | POA: Diagnosis not present

## 2021-08-28 DIAGNOSIS — M25552 Pain in left hip: Secondary | ICD-10-CM

## 2021-08-28 MED ORDER — METHYLPREDNISOLONE ACETATE 80 MG/ML IJ SUSP
80.0000 mg | Freq: Once | INTRAMUSCULAR | Status: AC
  Administered 2021-08-28: 80 mg

## 2021-08-28 NOTE — Progress Notes (Signed)
Numeric Pain Rating Scale and Functional Assessment Average Pain 8  Pain in both lower limbs especially when first waking up in the AM.  In the last MONTH (on 0-10 scale) has pain interfered with the following?  1. General activity like being  able to carry out your everyday physical activities such as walking, climbing stairs, carrying groceries, or moving a chair?  Rating(8)   +Driver, -BT, -Dye Allergies.

## 2021-09-03 ENCOUNTER — Ambulatory Visit: Payer: Medicare Other | Admitting: Physical Medicine and Rehabilitation

## 2021-09-05 ENCOUNTER — Encounter: Payer: Self-pay | Admitting: Physical Medicine and Rehabilitation

## 2021-09-05 ENCOUNTER — Ambulatory Visit: Payer: Self-pay

## 2021-09-05 ENCOUNTER — Ambulatory Visit (INDEPENDENT_AMBULATORY_CARE_PROVIDER_SITE_OTHER): Payer: Medicare Other | Admitting: Physical Medicine and Rehabilitation

## 2021-09-05 DIAGNOSIS — M25552 Pain in left hip: Secondary | ICD-10-CM

## 2021-09-05 MED ORDER — BUPIVACAINE HCL 0.25 % IJ SOLN
4.0000 mL | INTRAMUSCULAR | Status: AC | PRN
Start: 2021-09-05 — End: 2021-09-05
  Administered 2021-09-05: 4 mL via INTRA_ARTICULAR

## 2021-09-05 MED ORDER — TRIAMCINOLONE ACETONIDE 40 MG/ML IJ SUSP
60.0000 mg | INTRAMUSCULAR | Status: AC | PRN
Start: 2021-09-05 — End: 2021-09-05
  Administered 2021-09-05: 60 mg via INTRA_ARTICULAR

## 2021-09-05 NOTE — Progress Notes (Signed)
ICIS BUDREAU - 87 y.o. female MRN [id number]  Date of birth: 10-01-1934  Office Visit Note: Visit Date: 09/05/2021 PCP: Merrilee Seashore, MD Referred by: Merrilee Seashore, MD  Subjective: Chief Complaint  Patient presents with   Right Hip - Pain   Left Hip - Pain   HPI:  Megan [name] is a 87 y.o. female who comes in today for planned repeat Left anesthetic hip arthrogram with fluoroscopic guidance.  The patient has failed conservative care including home exercise, medications, time and activity modification. Prior injection gave more than 50% relief for several months. This injection will be diagnostic and hopefully therapeutic.  Please see requesting physician notes for further details and justification.  Referring: Dr. Landry Dyke Dean   ROS Otherwise per HPI.  Assessment & Plan: Visit Diagnoses:    ICD-10-CM   1. Pain in left hip  M25.552 XR C-ARM NO REPORT      Plan: No additional findings.   Meds & Orders: No orders of the defined types were placed in this encounter.   Orders Placed This Encounter  Procedures   Large Joint Inj   XR C-ARM NO REPORT    Follow-up: No follow-ups on file.   Procedures: Large Joint Inj: L hip joint on 09/05/2021 11:09 AM Indications: diagnostic evaluation and pain Details: 22 G 3.5 in needle, fluoroscopy-guided anterior approach  Arthrogram: No  Medications: 4 mL bupivacaine 0.25 %; 60 mg triamcinolone acetonide 40 MG/ML Outcome: tolerated well, no immediate complications  There was excellent flow of contrast producing a partial arthrogram of the hip. The patient did have relief of symptoms during the anesthetic phase of the injection. Procedure, treatment alternatives, risks and benefits explained, specific risks discussed. Consent was given by the patient. Immediately prior to procedure a time out was called to verify the correct patient, procedure, equipment, support staff and site/side marked as required. Patient was prepped and  draped in the usual sterile fashion.          Clinical History: EXAM: MRI LUMBAR SPINE WITHOUT CONTRAST   TECHNIQUE: Multiplanar, multisequence MR imaging of the lumbar spine was performed. No intravenous contrast was administered.   COMPARISON:  11/26/2016   FINDINGS: Segmentation:  5 lumbar type vertebrae   Alignment:  Degenerative grade 1 anterolisthesis at L4-5 and L5-S1.   Vertebrae: No fracture, evidence of discitis, or bone lesion. No pars defects on a 2020 CT of the abdomen.   Conus medullaris and cauda equina: Conus extends to the L1-2 level. Conus and cauda equina appear normal.   Paraspinal and other soft tissues: Negative for perispinal mass or inflammation.   Disc levels:   T12- L1: Unremarkable.   L1-L2: Ventral spondylitic spurring   L2-L3: Facet spurring with disc narrowing and bulging. Mild left foraminal narrowing   L3-L4: Facet osteoarthritis with spurring and ligamentum flavum thickening. Circumferential disc bulging. Moderate spinal stenosis which is progressed. Mild-to-moderate bilateral foraminal narrowing   L4-L5: Facet osteoarthritis with bulky spurring and ligamentum flavum thickening. Anterolisthesis. The disc is narrowed and bulging. Severe spinal stenosis with noncompressive bilateral foraminal narrowing   L5-S1:Facet osteoarthritis with spurring and mild anterolisthesis. Disc space narrowing and bulging with patent foramina. Bilateral subarticular recess narrowing that could affect the S1 nerve roots.   IMPRESSION: 1. Mild progression at L3-4, otherwise stable spinal degeneration affecting L3-4 and below. Anterolisthesis at L4-5 and L5-S1. 2. Spinal stenosis that is severe at L4-5 and moderate at L3-4. 3. L5-S1 bilateral subarticular recess narrowing encroaching on the descending  S1 nerve roots.     Electronically Signed   By: Monte Fantasia M.D.   On: 10/01/2020 04:20     Objective:  VS:  HT:    WT:   BMI:     BP:    HR: bpm  TEMP: ( )  RESP:  Physical Exam   Imaging: No results found.

## 2021-09-05 NOTE — Progress Notes (Signed)
Pt state both hip pain. Pt state walking, standing and laying down makes the pain worse. Pt state she takes over the counter pain meds to help ease her pain.  Numeric Pain Rating Scale and Functional Assessment Average Pain 6   In the last MONTH (on 0-10 scale) has pain interfered with the following?  1. General activity like being  able to carry out your everyday physical activities such as walking, climbing stairs, carrying groceries, or moving a chair?  Rating(9)   +Driver, -BT,

## 2021-09-06 NOTE — Procedures (Signed)
Lumbosacral Transforaminal Epidural Steroid Injection - Sub-Pedicular Approach with Fluoroscopic Guidance  [name]      Date of Birth: [birth year] MRN: [id number] PCP: Merrilee Seashore, MD      Visit Date: 08/28/2021   Universal Protocol:    Date/Time: 08/28/2021  Consent Given By: the patient  Position: PRONE  Additional Comments: Vital signs were monitored before and after the procedure. Patient was prepped and draped in the usual sterile fashion. The correct patient, procedure, and site was verified.   Injection Procedure Details:   Procedure diagnoses: Lumbar radiculopathy [M54.16]    Meds Administered:  Meds ordered this encounter  Medications   methylPREDNISolone acetate (DEPO-MEDROL) injection 80 mg    Laterality: Bilateral  Location/Site: L4  Needle:5.0 in., 22 ga.  Short bevel or Quincke spinal needle  Needle Placement: Transforaminal  Findings:    -Comments: Excellent flow of contrast along the nerve, nerve root and into the epidural space.  Procedure Details: After squaring off the end-plates to get a true AP view, the C-arm was positioned so that an oblique view of the foramen as noted above was visualized. The target area is just inferior to the "nose of the scotty dog" or sub pedicular. The soft tissues overlying this structure were infiltrated with 2-3 ml. of 1% Lidocaine without Epinephrine.  The spinal needle was inserted toward the target using a "trajectory" view along the fluoroscope beam.  Under AP and lateral visualization, the needle was advanced so it did not puncture dura and was located close the 6 O'Clock position of the pedical in AP tracterory. Biplanar projections were used to confirm position. Aspiration was confirmed to be negative for CSF and/or blood. A 1-2 ml. volume of Isovue-250 was injected and flow of contrast was noted at each level. Radiographs were obtained for documentation purposes.   After attaining the desired  flow of contrast documented above, a 0.5 to 1.0 ml test dose of 0.25% Marcaine was injected into each respective transforaminal space.  The patient was observed for 90 seconds post injection.  After no sensory deficits were reported, and normal lower extremity motor function was noted,   the above injectate was administered so that equal amounts of the injectate were placed at each foramen (level) into the transforaminal epidural space.   Additional Comments:  The patient tolerated the procedure well Dressing: 2 x 2 sterile gauze and Band-Aid    Post-procedure details: Patient was observed during the procedure. Post-procedure instructions were reviewed.  Patient left the clinic in stable condition.

## 2021-09-06 NOTE — Progress Notes (Signed)
Megan [name] - 87 y.o. female MRN [id number]  Date of birth: 08-Jan-1935  Office Visit Note: Visit Date: 08/28/2021 PCP: Merrilee Seashore, MD Referred by: Merrilee Seashore, MD  Subjective: Chief Complaint  Patient presents with   Right Leg - Pain   Left Leg - Pain   HPI:  Megan [name] is a 87 y.o. female who comes in today for evaluation and management of 2 distinct chronic severe worsening problems.  First includes low back pain with bilateral leg pain consistent with neurogenic claudication and lumbar spine spondylosis with radiculopathy.  We had planned to have her come in today potentially for repeat epidural injection which is a bilateral L4 transforaminal injection.  This has helped in the past more than 50% and she has gotten several months out of this.  She is not a great surgical candidate at 87 years old.  She has failed conservative care with physical therapy and home exercise which she continues to try to do.  She has had multiple other fractures and falls over the years and is somewhat debilitated in general.  She ambulates with a cane.  She denies any focal weakness or other new issues.  She has not had any red flag complaints.  Secondary issue is chronic severe left hip pain.  She is status post fracture with internal fixation.  She typically sees Dr. Anderson Malta from an orthopedic standpoint but I believe he was not the one that did the fixation of the left hip.  She has had prior injection into the hip with osteoarthritic changes with good relief in the past and that has not been completed in some time.  She reports that this hip pain started over the last months and has just gotten worse and is worse with ambulation and going up and down stairs and getting in and out of the car.  She does endorse some groin pain.   Review of Systems  Musculoskeletal:  Positive for back pain and joint pain.  Neurological:  Positive for tingling.  All other systems reviewed and are  negative.  Otherwise per HPI.  Assessment & Plan: Visit Diagnoses:    ICD-10-CM   1. Lumbar radiculopathy  M54.16 XR C-ARM NO REPORT    Epidural Steroid injection    methylPREDNISolone acetate (DEPO-MEDROL) injection 80 mg    2. Spinal stenosis of lumbar region with neurogenic claudication  M48.062     3. Pain in left hip  M25.552     4. Unilateral primary osteoarthritis, left hip  M16.12     5. S/p left hip fracture  Z87.81       Plan: Findings:  1.  Chronic worsening severe bilateral low back and leg pain consistent with neurogenic claudication and stenosis.  Completed bilateral L4 transforaminal injection today.  Will follow-up as needed.  Not a great surgical candidate.  2.  Left hip pain status post hip fracture and internal fixation with osteoarthritic changes and consistent symptoms with left hip and groin pain worse with ambulating and rotation.  Will schedule her for repeat left hip injection.  Will follow-up with Dr. Marlou Sa if necessary.    Meds & Orders:  Meds ordered this encounter  Medications   methylPREDNISolone acetate (DEPO-MEDROL) injection 80 mg    Orders Placed This Encounter  Procedures   XR C-ARM NO REPORT   Epidural Steroid injection    Follow-up: Return for Schedule left hip injection.   Procedures: No procedures performed  Lumbosacral Transforaminal Epidural Steroid  Injection - Sub-Pedicular Approach with Fluoroscopic Guidance  [name]      Date of Birth: Jun 19, 1934 MRN: [id number] PCP: Merrilee Seashore, MD      Visit Date: 08/28/2021   Universal Protocol:    Date/Time: 08/28/2021  Consent Given By: the patient  Position: PRONE  Additional Comments: Vital signs were monitored before and after the procedure. Patient was prepped and draped in the usual sterile fashion. The correct patient, procedure, and site was verified.   Injection Procedure Details:   Procedure diagnoses: Lumbar radiculopathy [M54.16]    Meds  Administered:  Meds ordered this encounter  Medications   methylPREDNISolone acetate (DEPO-MEDROL) injection 80 mg    Laterality: Bilateral  Location/Site: L4  Needle:5.0 in., 22 ga.  Short bevel or Quincke spinal needle  Needle Placement: Transforaminal  Findings:    -Comments: Excellent flow of contrast along the nerve, nerve root and into the epidural space.  Procedure Details: After squaring off the end-plates to get a true AP view, the C-arm was positioned so that an oblique view of the foramen as noted above was visualized. The target area is just inferior to the "nose of the scotty dog" or sub pedicular. The soft tissues overlying this structure were infiltrated with 2-3 ml. of 1% Lidocaine without Epinephrine.  The spinal needle was inserted toward the target using a "trajectory" view along the fluoroscope beam.  Under AP and lateral visualization, the needle was advanced so it did not puncture dura and was located close the 6 O'Clock position of the pedical in AP tracterory. Biplanar projections were used to confirm position. Aspiration was confirmed to be negative for CSF and/or blood. A 1-2 ml. volume of Isovue-250 was injected and flow of contrast was noted at each level. Radiographs were obtained for documentation purposes.   After attaining the desired flow of contrast documented above, a 0.5 to 1.0 ml test dose of 0.25% Marcaine was injected into each respective transforaminal space.  The patient was observed for 90 seconds post injection.  After no sensory deficits were reported, and normal lower extremity motor function was noted,   the above injectate was administered so that equal amounts of the injectate were placed at each foramen (level) into the transforaminal epidural space.   Additional Comments:  The patient tolerated the procedure well Dressing: 2 x 2 sterile gauze and Band-Aid    Post-procedure details: Patient was observed during the  procedure. Post-procedure instructions were reviewed.  Patient left the clinic in stable condition.    Clinical History: EXAM: MRI LUMBAR SPINE WITHOUT CONTRAST   TECHNIQUE: Multiplanar, multisequence MR imaging of the lumbar spine was performed. No intravenous contrast was administered.   COMPARISON:  11/26/2016   FINDINGS: Segmentation:  5 lumbar type vertebrae   Alignment:  Degenerative grade 1 anterolisthesis at L4-5 and L5-S1.   Vertebrae: No fracture, evidence of discitis, or bone lesion. No pars defects on a 2020 CT of the abdomen.   Conus medullaris and cauda equina: Conus extends to the L1-2 level. Conus and cauda equina appear normal.   Paraspinal and other soft tissues: Negative for perispinal mass or inflammation.   Disc levels:   T12- L1: Unremarkable.   L1-L2: Ventral spondylitic spurring   L2-L3: Facet spurring with disc narrowing and bulging. Mild left foraminal narrowing   L3-L4: Facet osteoarthritis with spurring and ligamentum flavum thickening. Circumferential disc bulging. Moderate spinal stenosis which is progressed. Mild-to-moderate bilateral foraminal narrowing   L4-L5: Facet osteoarthritis with bulky spurring  and ligamentum flavum thickening. Anterolisthesis. The disc is narrowed and bulging. Severe spinal stenosis with noncompressive bilateral foraminal narrowing   L5-S1:Facet osteoarthritis with spurring and mild anterolisthesis. Disc space narrowing and bulging with patent foramina. Bilateral subarticular recess narrowing that could affect the S1 nerve roots.   IMPRESSION: 1. Mild progression at L3-4, otherwise stable spinal degeneration affecting L3-4 and below. Anterolisthesis at L4-5 and L5-S1. 2. Spinal stenosis that is severe at L4-5 and moderate at L3-4. 3. L5-S1 bilateral subarticular recess narrowing encroaching on the descending S1 nerve roots.     Electronically Signed   By: Monte Fantasia M.D.   On: 10/01/2020 04:20      Objective:  VS:  HT:    WT:   BMI:     BP:115/70  HR:80bpm  TEMP: ( )  RESP:  Physical Exam Vitals and nursing note reviewed.  Constitutional:      General: She is not in acute distress.    Appearance: Normal appearance. She is obese. She is not ill-appearing.  HENT:     Head: Normocephalic and atraumatic.     Right Ear: External ear normal.     Left Ear: External ear normal.  Eyes:     Extraocular Movements: Extraocular movements intact.  Cardiovascular:     Rate and Rhythm: Normal rate.     Pulses: Normal pulses.  Pulmonary:     Effort: Pulmonary effort is normal. No respiratory distress.  Abdominal:     General: There is no distension.     Palpations: Abdomen is soft.  Musculoskeletal:        General: Tenderness present.     Cervical back: Neck supple.     Right lower leg: No edema.     Left lower leg: No edema.     Comments: Patient has good distal strength with no pain over the greater trochanters.  No clonus or focal weakness.  Some difficulty going from sit to stand with pain with facet loading of the lower spine.  She has pain with internal rotation of the left hip not the right hip.  Skin:    Findings: No erythema, lesion or rash.  Neurological:     General: No focal deficit present.     Mental Status: She is alert and oriented to person, place, and time.     Sensory: No sensory deficit.     Motor: No weakness or abnormal muscle tone.     Coordination: Coordination normal.     Gait: Gait abnormal.  Psychiatric:        Mood and Affect: Mood normal.        Behavior: Behavior normal.      Imaging: XR C-ARM NO REPORT  Result Date: 09/05/2021 Please see Notes tab for imaging impression.

## 2021-09-13 DIAGNOSIS — H16223 Keratoconjunctivitis sicca, not specified as Sjogren's, bilateral: Secondary | ICD-10-CM | POA: Diagnosis not present

## 2021-09-13 DIAGNOSIS — H04123 Dry eye syndrome of bilateral lacrimal glands: Secondary | ICD-10-CM | POA: Diagnosis not present

## 2021-09-13 DIAGNOSIS — H47011 Ischemic optic neuropathy, right eye: Secondary | ICD-10-CM | POA: Diagnosis not present

## 2021-09-18 DIAGNOSIS — E113293 Type 2 diabetes mellitus with mild nonproliferative diabetic retinopathy without macular edema, bilateral: Secondary | ICD-10-CM | POA: Diagnosis not present

## 2021-09-18 DIAGNOSIS — I700 Atherosclerosis of aorta: Secondary | ICD-10-CM | POA: Diagnosis not present

## 2021-09-18 DIAGNOSIS — E782 Mixed hyperlipidemia: Secondary | ICD-10-CM | POA: Diagnosis not present

## 2021-09-18 DIAGNOSIS — N3281 Overactive bladder: Secondary | ICD-10-CM | POA: Diagnosis not present

## 2021-09-18 DIAGNOSIS — I2510 Atherosclerotic heart disease of native coronary artery without angina pectoris: Secondary | ICD-10-CM | POA: Diagnosis not present

## 2021-09-18 DIAGNOSIS — H35039 Hypertensive retinopathy, unspecified eye: Secondary | ICD-10-CM | POA: Diagnosis not present

## 2021-09-18 DIAGNOSIS — R051 Acute cough: Secondary | ICD-10-CM | POA: Diagnosis not present

## 2021-09-18 DIAGNOSIS — D692 Other nonthrombocytopenic purpura: Secondary | ICD-10-CM | POA: Diagnosis not present

## 2021-09-18 DIAGNOSIS — R0981 Nasal congestion: Secondary | ICD-10-CM | POA: Diagnosis not present

## 2021-09-18 DIAGNOSIS — N1831 Chronic kidney disease, stage 3a: Secondary | ICD-10-CM | POA: Diagnosis not present

## 2021-09-19 DIAGNOSIS — L570 Actinic keratosis: Secondary | ICD-10-CM | POA: Diagnosis not present

## 2021-09-19 DIAGNOSIS — L814 Other melanin hyperpigmentation: Secondary | ICD-10-CM | POA: Diagnosis not present

## 2021-09-19 DIAGNOSIS — D225 Melanocytic nevi of trunk: Secondary | ICD-10-CM | POA: Diagnosis not present

## 2021-09-19 DIAGNOSIS — L821 Other seborrheic keratosis: Secondary | ICD-10-CM | POA: Diagnosis not present

## 2021-09-25 DIAGNOSIS — E782 Mixed hyperlipidemia: Secondary | ICD-10-CM | POA: Diagnosis not present

## 2021-09-25 DIAGNOSIS — Z23 Encounter for immunization: Secondary | ICD-10-CM | POA: Diagnosis not present

## 2021-09-25 DIAGNOSIS — I700 Atherosclerosis of aorta: Secondary | ICD-10-CM | POA: Diagnosis not present

## 2021-09-25 DIAGNOSIS — N1831 Chronic kidney disease, stage 3a: Secondary | ICD-10-CM | POA: Diagnosis not present

## 2021-09-25 DIAGNOSIS — D692 Other nonthrombocytopenic purpura: Secondary | ICD-10-CM | POA: Diagnosis not present

## 2021-09-25 DIAGNOSIS — E039 Hypothyroidism, unspecified: Secondary | ICD-10-CM | POA: Diagnosis not present

## 2021-09-25 DIAGNOSIS — E113293 Type 2 diabetes mellitus with mild nonproliferative diabetic retinopathy without macular edema, bilateral: Secondary | ICD-10-CM | POA: Diagnosis not present

## 2021-09-25 DIAGNOSIS — I10 Essential (primary) hypertension: Secondary | ICD-10-CM | POA: Diagnosis not present

## 2021-09-25 DIAGNOSIS — H35039 Hypertensive retinopathy, unspecified eye: Secondary | ICD-10-CM | POA: Diagnosis not present

## 2021-09-25 DIAGNOSIS — N3281 Overactive bladder: Secondary | ICD-10-CM | POA: Diagnosis not present

## 2021-09-25 DIAGNOSIS — I2510 Atherosclerotic heart disease of native coronary artery without angina pectoris: Secondary | ICD-10-CM | POA: Diagnosis not present

## 2021-10-18 ENCOUNTER — Other Ambulatory Visit: Payer: Self-pay | Admitting: Internal Medicine

## 2021-10-18 DIAGNOSIS — Z1231 Encounter for screening mammogram for malignant neoplasm of breast: Secondary | ICD-10-CM

## 2021-11-15 ENCOUNTER — Ambulatory Visit: Payer: Medicare Other

## 2021-12-17 ENCOUNTER — Ambulatory Visit
Admission: RE | Admit: 2021-12-17 | Discharge: 2021-12-17 | Disposition: A | Discharge status: Home / Self Care | Payer: Medicare Other | Source: Ambulatory Visit | Attending: Internal Medicine | Admitting: Internal Medicine

## 2021-12-17 DIAGNOSIS — Z1231 Encounter for screening mammogram for malignant neoplasm of breast: Secondary | ICD-10-CM

## 2022-01-01 DIAGNOSIS — E1165 Type 2 diabetes mellitus with hyperglycemia: Secondary | ICD-10-CM | POA: Diagnosis not present

## 2022-01-01 DIAGNOSIS — D692 Other nonthrombocytopenic purpura: Secondary | ICD-10-CM | POA: Diagnosis not present

## 2022-01-01 DIAGNOSIS — E782 Mixed hyperlipidemia: Secondary | ICD-10-CM | POA: Diagnosis not present

## 2022-01-01 DIAGNOSIS — E039 Hypothyroidism, unspecified: Secondary | ICD-10-CM | POA: Diagnosis not present

## 2022-01-01 DIAGNOSIS — N1831 Chronic kidney disease, stage 3a: Secondary | ICD-10-CM | POA: Diagnosis not present

## 2022-01-01 DIAGNOSIS — I2510 Atherosclerotic heart disease of native coronary artery without angina pectoris: Secondary | ICD-10-CM | POA: Diagnosis not present

## 2022-01-01 DIAGNOSIS — I700 Atherosclerosis of aorta: Secondary | ICD-10-CM | POA: Diagnosis not present

## 2022-01-01 DIAGNOSIS — H35039 Hypertensive retinopathy, unspecified eye: Secondary | ICD-10-CM | POA: Diagnosis not present

## 2022-01-01 DIAGNOSIS — N3281 Overactive bladder: Secondary | ICD-10-CM | POA: Diagnosis not present

## 2022-01-01 DIAGNOSIS — I10 Essential (primary) hypertension: Secondary | ICD-10-CM | POA: Diagnosis not present

## 2022-01-10 ENCOUNTER — Telehealth: Payer: Self-pay | Admitting: Physical Medicine and Rehabilitation

## 2022-01-10 NOTE — Telephone Encounter (Signed)
Pt called requesting an appt for injection in lower back. Pt phone number is [phone]

## 2022-01-11 NOTE — Telephone Encounter (Signed)
LVM #1 to return call to clinic

## 2022-01-14 ENCOUNTER — Telehealth: Payer: Self-pay | Admitting: Physical Medicine and Rehabilitation

## 2022-01-14 NOTE — Telephone Encounter (Signed)
See previous encounter

## 2022-01-14 NOTE — Telephone Encounter (Signed)
Pt called to set an appt. Please call landline at [phone].

## 2022-01-15 NOTE — Telephone Encounter (Signed)
LVM #2 to return call to clinic

## 2022-01-16 ENCOUNTER — Telehealth: Payer: Self-pay | Admitting: Physical Medicine and Rehabilitation

## 2022-01-16 NOTE — Telephone Encounter (Signed)
See previous encounter

## 2022-01-16 NOTE — Telephone Encounter (Signed)
LVM #3 to return call to clinic

## 2022-01-16 NOTE — Telephone Encounter (Signed)
Pt called again to set an appt. Please call pt at [phone].

## 2022-01-17 ENCOUNTER — Telehealth: Payer: Self-pay

## 2022-01-17 ENCOUNTER — Other Ambulatory Visit: Payer: Self-pay | Admitting: Physical Medicine and Rehabilitation

## 2022-01-17 DIAGNOSIS — M5416 Radiculopathy, lumbar region: Secondary | ICD-10-CM

## 2022-01-17 NOTE — Progress Notes (Signed)
Spoke with patient via telephone, she reports greater than 80% relief of pain for more than 3 months with previous lumbar epidural steroid injection on 08/28/2021. States her pain returned about 1 week ago. I placed order for repeat injection.

## 2022-01-17 NOTE — Telephone Encounter (Signed)
Spoke with patient and she is having pain in lower back on both sides and would like another injection. Last injection in August lasted until 2 weeks ago. No new falls, injury or accidents. Please advise

## 2022-02-06 ENCOUNTER — Ambulatory Visit: Payer: Self-pay

## 2022-02-06 ENCOUNTER — Ambulatory Visit: Payer: Medicare Other | Admitting: Physical Medicine and Rehabilitation

## 2022-02-06 VITALS — BP 127/79 | HR 74

## 2022-02-06 DIAGNOSIS — M5416 Radiculopathy, lumbar region: Secondary | ICD-10-CM

## 2022-02-06 MED ORDER — METHYLPREDNISOLONE ACETATE 80 MG/ML IJ SUSP
80.0000 mg | Freq: Once | INTRAMUSCULAR | Status: AC
  Administered 2022-02-06: 80 mg

## 2022-02-06 NOTE — Patient Instructions (Signed)

## 2022-02-06 NOTE — Progress Notes (Signed)
Functional Pain Scale - descriptive words and definitions  Intense (8)    Cannot complete any ADLs without much assistance/cannot concentrate/conversation is difficult/unable to sleep and unable to use distraction. Severe range order  Average Pain  varies between 8-10   +Driver, -BT, -Dye Allergies.  Lower back pain on both sides that radiates down the leg. Numbness and tingling from the knee down to the ankle

## 2022-02-08 ENCOUNTER — Other Ambulatory Visit: Payer: Self-pay | Admitting: Physical Medicine and Rehabilitation

## 2022-02-08 NOTE — Telephone Encounter (Signed)
Ok to rf pls calla thx

## 2022-02-17 NOTE — Procedures (Signed)
Lumbosacral Transforaminal Epidural Steroid Injection - Sub-Pedicular Approach with Fluoroscopic Guidance  [name]      Date of Birth: Jun 08, 1934 MRN: [id number] PCP: Merrilee Seashore, MD      Visit Date: 02/06/2022   Universal Protocol:    Date/Time: 02/06/2022  Consent Given By: the patient  Position: PRONE  Additional Comments: Vital signs were monitored before and after the procedure. Patient was prepped and draped in the usual sterile fashion. The correct patient, procedure, and site was verified.   Injection Procedure Details:   Procedure diagnoses: Lumbar radiculopathy [M54.16]    Meds Administered:  Meds ordered this encounter  Medications   methylPREDNISolone acetate (DEPO-MEDROL) injection 80 mg    Laterality: Bilateral  Location/Site: L4  Needle:5.0 in., 22 ga.  Short bevel or Quincke spinal needle  Needle Placement: Transforaminal  Findings:    -Comments: Excellent flow of contrast along the nerve, nerve root and into the epidural space.  Procedure Details: After squaring off the end-plates to get a true AP view, the C-arm was positioned so that an oblique view of the foramen as noted above was visualized. The target area is just inferior to the "nose of the scotty dog" or sub pedicular. The soft tissues overlying this structure were infiltrated with 2-3 ml. of 1% Lidocaine without Epinephrine.  The spinal needle was inserted toward the target using a "trajectory" view along the fluoroscope beam.  Under AP and lateral visualization, the needle was advanced so it did not puncture dura and was located close the 6 O'Clock position of the pedical in AP tracterory. Biplanar projections were used to confirm position. Aspiration was confirmed to be negative for CSF and/or blood. A 1-2 ml. volume of Isovue-250 was injected and flow of contrast was noted at each level. Radiographs were obtained for documentation purposes.   After attaining the desired  flow of contrast documented above, a 0.5 to 1.0 ml test dose of 0.25% Marcaine was injected into each respective transforaminal space.  The patient was observed for 90 seconds post injection.  After no sensory deficits were reported, and normal lower extremity motor function was noted,   the above injectate was administered so that equal amounts of the injectate were placed at each foramen (level) into the transforaminal epidural space.   Additional Comments:  No complications occurred Dressing: 2 x 2 sterile gauze and Band-Aid    Post-procedure details: Patient was observed during the procedure. Post-procedure instructions were reviewed.  Patient left the clinic in stable condition.

## 2022-02-17 NOTE — Progress Notes (Signed)
Megan [name] - 88 y.o. female MRN [id number]  Date of birth: 1934-10-04  Office Visit Note: Visit Date: 02/06/2022 PCP: Merrilee Seashore, MD Referred by: Merrilee Seashore, MD  Subjective: Chief Complaint  Patient presents with   Lower Back - Pain   HPI:  Megan [name] is a 88 y.o. female who comes in today for planned repeat Bilateral L4-5  Lumbar Transforaminal epidural steroid injection with fluoroscopic guidance.  The patient has failed conservative care including home exercise, medications, time and activity modification.  This injection will be diagnostic and hopefully therapeutic.  Please see requesting physician notes for further details and justification. Patient received more than 50% pain relief from prior injection.   Referring: Barnet Pall, FNP and Dr. Landry Dyke Dean   ROS Otherwise per HPI.  Assessment & Plan: Visit Diagnoses:    ICD-10-CM   1. Lumbar radiculopathy  M54.16 XR C-ARM NO REPORT    Epidural Steroid injection    methylPREDNISolone acetate (DEPO-MEDROL) injection 80 mg      Plan: No additional findings.   Meds & Orders:  Meds ordered this encounter  Medications   methylPREDNISolone acetate (DEPO-MEDROL) injection 80 mg    Orders Placed This Encounter  Procedures   XR C-ARM NO REPORT   Epidural Steroid injection    Follow-up: Return for visit to requesting provider as needed.   Procedures: No procedures performed  Lumbosacral Transforaminal Epidural Steroid Injection - Sub-Pedicular Approach with Fluoroscopic Guidance  [name]      Date of Birth: Jul 04, 1934 MRN: [id number] PCP: Merrilee Seashore, MD      Visit Date: 02/06/2022   Universal Protocol:    Date/Time: 02/06/2022  Consent Given By: the patient  Position: PRONE  Additional Comments: Vital signs were monitored before and after the procedure. Patient was prepped and draped in the usual sterile fashion. The correct patient, procedure, and site was  verified.   Injection Procedure Details:   Procedure diagnoses: Lumbar radiculopathy [M54.16]    Meds Administered:  Meds ordered this encounter  Medications   methylPREDNISolone acetate (DEPO-MEDROL) injection 80 mg    Laterality: Bilateral  Location/Site: L4  Needle:5.0 in., 22 ga.  Short bevel or Quincke spinal needle  Needle Placement: Transforaminal  Findings:    -Comments: Excellent flow of contrast along the nerve, nerve root and into the epidural space.  Procedure Details: After squaring off the end-plates to get a true AP view, the C-arm was positioned so that an oblique view of the foramen as noted above was visualized. The target area is just inferior to the "nose of the scotty dog" or sub pedicular. The soft tissues overlying this structure were infiltrated with 2-3 ml. of 1% Lidocaine without Epinephrine.  The spinal needle was inserted toward the target using a "trajectory" view along the fluoroscope beam.  Under AP and lateral visualization, the needle was advanced so it did not puncture dura and was located close the 6 O'Clock position of the pedical in AP tracterory. Biplanar projections were used to confirm position. Aspiration was confirmed to be negative for CSF and/or blood. A 1-2 ml. volume of Isovue-250 was injected and flow of contrast was noted at each level. Radiographs were obtained for documentation purposes.   After attaining the desired flow of contrast documented above, a 0.5 to 1.0 ml test dose of 0.25% Marcaine was injected into each respective transforaminal space.  The patient was observed for 90 seconds post injection.  After no sensory deficits were reported,  and normal lower extremity motor function was noted,   the above injectate was administered so that equal amounts of the injectate were placed at each foramen (level) into the transforaminal epidural space.   Additional Comments:  No complications occurred Dressing: 2 x 2 sterile gauze and  Band-Aid    Post-procedure details: Patient was observed during the procedure. Post-procedure instructions were reviewed.  Patient left the clinic in stable condition.    Clinical History: EXAM: MRI LUMBAR SPINE WITHOUT CONTRAST   TECHNIQUE: Multiplanar, multisequence MR imaging of the lumbar spine was performed. No intravenous contrast was administered.   COMPARISON:  11/26/2016   FINDINGS: Segmentation:  5 lumbar type vertebrae   Alignment:  Degenerative grade 1 anterolisthesis at L4-5 and L5-S1.   Vertebrae: No fracture, evidence of discitis, or bone lesion. No pars defects on a 2020 CT of the abdomen.   Conus medullaris and cauda equina: Conus extends to the L1-2 level. Conus and cauda equina appear normal.   Paraspinal and other soft tissues: Negative for perispinal mass or inflammation.   Disc levels:   T12- L1: Unremarkable.   L1-L2: Ventral spondylitic spurring   L2-L3: Facet spurring with disc narrowing and bulging. Mild left foraminal narrowing   L3-L4: Facet osteoarthritis with spurring and ligamentum flavum thickening. Circumferential disc bulging. Moderate spinal stenosis which is progressed. Mild-to-moderate bilateral foraminal narrowing   L4-L5: Facet osteoarthritis with bulky spurring and ligamentum flavum thickening. Anterolisthesis. The disc is narrowed and bulging. Severe spinal stenosis with noncompressive bilateral foraminal narrowing   L5-S1:Facet osteoarthritis with spurring and mild anterolisthesis. Disc space narrowing and bulging with patent foramina. Bilateral subarticular recess narrowing that could affect the S1 nerve roots.   IMPRESSION: 1. Mild progression at L3-4, otherwise stable spinal degeneration affecting L3-4 and below. Anterolisthesis at L4-5 and L5-S1. 2. Spinal stenosis that is severe at L4-5 and moderate at L3-4. 3. L5-S1 bilateral subarticular recess narrowing encroaching on the descending S1 nerve roots.      Electronically Signed   By: Monte Fantasia M.D.   On: 10/01/2020 04:20     Objective:  VS:  HT:    WT:   BMI:     BP:127/79  HR:74bpm  TEMP: ( )  RESP:  Physical Exam Vitals and nursing note reviewed.  Constitutional:      General: She is not in acute distress.    Appearance: Normal appearance. She is not ill-appearing.  HENT:     Head: Normocephalic and atraumatic.     Right Ear: External ear normal.     Left Ear: External ear normal.  Eyes:     Extraocular Movements: Extraocular movements intact.  Cardiovascular:     Rate and Rhythm: Normal rate.     Pulses: Normal pulses.  Pulmonary:     Effort: Pulmonary effort is normal. No respiratory distress.  Abdominal:     General: There is no distension.     Palpations: Abdomen is soft.  Musculoskeletal:        General: Tenderness present.     Cervical back: Neck supple.     Right lower leg: No edema.     Left lower leg: No edema.     Comments: Patient has good distal strength with no pain over the greater trochanters.  No clonus or focal weakness.  Skin:    Findings: No erythema, lesion or rash.  Neurological:     General: No focal deficit present.     Mental Status: She is alert and oriented to  person, place, and time.     Sensory: No sensory deficit.     Motor: No weakness or abnormal muscle tone.     Coordination: Coordination normal.  Psychiatric:        Mood and Affect: Mood normal.        Behavior: Behavior normal.      Imaging: No results found.

## 2022-02-20 ENCOUNTER — Ambulatory Visit (INDEPENDENT_AMBULATORY_CARE_PROVIDER_SITE_OTHER): Payer: Medicare Other

## 2022-02-20 ENCOUNTER — Ambulatory Visit: Payer: Self-pay

## 2022-02-20 ENCOUNTER — Ambulatory Visit: Payer: Medicare Other | Admitting: Orthopedic Surgery

## 2022-02-20 DIAGNOSIS — M7062 Trochanteric bursitis, left hip: Secondary | ICD-10-CM | POA: Diagnosis not present

## 2022-02-20 DIAGNOSIS — M79605 Pain in left leg: Secondary | ICD-10-CM | POA: Diagnosis not present

## 2022-02-20 DIAGNOSIS — M25552 Pain in left hip: Secondary | ICD-10-CM | POA: Diagnosis not present

## 2022-02-21 NOTE — Progress Notes (Signed)
Office Visit Note   [name]           Date of Birth: 04/02/1934           MRN: [id number] Visit Date: 02/20/2022 Requested by: Merrilee Seashore, [address] [place],  [place] [id number] PCP: Merrilee Seashore, MD  Subjective: Chief Complaint  Patient presents with   Left Leg - Pain    HPI: Megan [name] is a 88 y.o. female who presents to the office reporting left hip pain.  She describes having a fall 07/03/2020 and had surgery with intramedullary hip screw done at that time at another location.  She states that she cannot sleep on the left-hand side.  Does report low back pain as well.  Describes some groin pain as well as numbness and tingling in the leg.  Buttock pain is also present.  She ambulates with a walker.  She recently had an epidural steroid injection without relief.  Shots have helped her back but not really helped the focal pain she is having around the trochanteric region in the left hip..                ROS: All systems reviewed are negative as they relate to the chief complaint within the history of present illness.  Patient denies fevers or chills.  Assessment & Plan: Visit Diagnoses:  1. Pain in left leg     Plan: Impression is left hip pain with some prominence of the lag and compression screw which have settled some.  The fracture appears to be healed.  Distal interlocking screw broken on radiographs.  Hip arthritis also present.  Does have discrete tenderness around the trochanteric region.  Plan is ultrasound-guided injection around the prominent hardware.  Will see if that gives her relief.  She has discussed with her surgeon potential for hardware removal.  Follow-up as needed.  Follow-Up Instructions: No follow-ups on file.   Orders:  Orders Placed This Encounter  Procedures   XR FEMUR MIN 2 VIEWS LEFT   XR Lumbar Spine 2-3 Views   US Guided Needle Placement - No Linked Charges   No orders of the defined types were  placed in this encounter.     Procedures: Large Joint Inj: L greater trochanter on 02/20/2022 4:31 PM Indications: pain and diagnostic evaluation Details: 18 G 3.5 in needle, ultrasound-guided lateral approach  Arthrogram: No  Medications: 5 mL lidocaine 1 %; 80 mg methylPREDNISolone acetate 80 MG/ML; 4 mL bupivacaine 0.25 % Outcome: tolerated well, no immediate complications Procedure, treatment alternatives, risks and benefits explained, specific risks discussed. Consent was given by the patient. Immediately prior to procedure a time out was called to verify the correct patient, procedure, equipment, support staff and site/side marked as required. Patient was prepped and draped in the usual sterile fashion.       Clinical Data: No additional findings.  Objective: Vital Signs: LMP  (LMP Unknown)   Physical Exam:  Constitutional: Patient appears well-developed HEENT:  Head: Normocephalic Eyes:EOM are normal Neck: Normal range of motion Cardiovascular: Normal rate Pulmonary/chest: Effort normal Neurologic: Patient is alert Skin: Skin is warm Psychiatric: Patient has normal mood and affect  Ortho Exam: Ortho exam demonstrates mild groin pain with internal/external Tatian of the left leg.  Does have tenderness just distal to the trochanteric region.  Hip flexion abduction adduction strength intact.  Gait is normal.  No masses lymphadenopathy or skin changes noted in that hip region.  Incisions  well-healed.  Specialty Comments:  EXAM: MRI LUMBAR SPINE WITHOUT CONTRAST   TECHNIQUE: Multiplanar, multisequence MR imaging of the lumbar spine was performed. No intravenous contrast was administered.   COMPARISON:  11/26/2016   FINDINGS: Segmentation:  5 lumbar type vertebrae   Alignment:  Degenerative grade 1 anterolisthesis at L4-5 and L5-S1.   Vertebrae: No fracture, evidence of discitis, or bone lesion. No pars defects on a 2020 CT of the abdomen.   Conus medullaris  and cauda equina: Conus extends to the L1-2 level. Conus and cauda equina appear normal.   Paraspinal and other soft tissues: Negative for perispinal mass or inflammation.   Disc levels:   T12- L1: Unremarkable.   L1-L2: Ventral spondylitic spurring   L2-L3: Facet spurring with disc narrowing and bulging. Mild left foraminal narrowing   L3-L4: Facet osteoarthritis with spurring and ligamentum flavum thickening. Circumferential disc bulging. Moderate spinal stenosis which is progressed. Mild-to-moderate bilateral foraminal narrowing   L4-L5: Facet osteoarthritis with bulky spurring and ligamentum flavum thickening. Anterolisthesis. The disc is narrowed and bulging. Severe spinal stenosis with noncompressive bilateral foraminal narrowing   L5-S1:Facet osteoarthritis with spurring and mild anterolisthesis. Disc space narrowing and bulging with patent foramina. Bilateral subarticular recess narrowing that could affect the S1 nerve roots.   IMPRESSION: 1. Mild progression at L3-4, otherwise stable spinal degeneration affecting L3-4 and below. Anterolisthesis at L4-5 and L5-S1. 2. Spinal stenosis that is severe at L4-5 and moderate at L3-4. 3. L5-S1 bilateral subarticular recess narrowing encroaching on the descending S1 nerve roots.     Electronically Signed   By: Monte Fantasia M.D.   On: 10/01/2020 04:20  Imaging: No results found.   PMFS History: Patient Active Problem List   Diagnosis Date Noted   Hypertensive retinopathy 09/13/2020   Gastroesophageal reflux disease 09/13/2020   Dyspnea on exertion 03/23/2018   Other secondary pulmonary hypertension ([place]) 03/23/2018   S/P TAVR (transcatheter aortic valve replacement) 03/03/2018   Diabetes mellitus without complication (HCC)    Hypertension    Hypothyroidism    Anterior ischemic optic neuropathy of right eye 05/28/2017   Snoring 05/28/2017   OSA (obstructive sleep apnea) 05/28/2017   Shoulder arthritis  10/29/2016   Severe aortic stenosis 06/21/2016   Lightheaded 06/05/2016   Poor balance 06/05/2016   Sensorineural hearing loss (SNHL), bilateral 02/02/2016   Arthritis of shoulder region, left, degenerative 07/14/2014   Past Medical History:  Diagnosis Date   Anemia    Arthritis    Cancer ([place]) 1989   melanoma   Diabetes mellitus without complication (HCC)    Dizziness    GERD (gastroesophageal reflux disease)    History of hiatal hernia    Hypertension    Hypothyroidism    IBS (irritable bowel syndrome)    S/P TAVR (transcatheter aortic valve replacement) 03/03/2018   s/p 23 mm Edwards Sapien THV via the TF approach   Severe aortic stenosis     Family History  Problem Relation Age of Onset   Hypertension Mother    Hypertension Father    Heart disease Father    Heart disease Brother     Past Surgical History:  Procedure Laterality Date   APPENDECTOMY     88 yrs old   [place] Right 10/1999   [place], MD   CHOLECYSTECTOMY  89   COLONOSCOPY     EYE SURGERY Bilateral 2017   cataract surgery with lens implants   JOINT REPLACEMENT Right    knee    REVERSE  SHOULDER ARTHROPLASTY Left 07/14/2014   REVERSE SHOULDER ARTHROPLASTY Left 07/14/2014   Procedure: LEFT REVERSE SHOULDER ARTHROPLASTY;  Surgeon: Meredith Pel, MD;  Location: [facility];  Service: Orthopedics;  Laterality: Left;   RIGHT/LEFT HEART CATH AND CORONARY ANGIOGRAPHY N/A 01/29/2018   Procedure: RIGHT/LEFT HEART CATH AND CORONARY ANGIOGRAPHY;  Surgeon: Belva Crome, MD;  Location: [facility];  Service: Cardiovascular;  Laterality: N/A;   SHOULDER ARTHROSCOPY Left    TEE WITHOUT CARDIOVERSION N/A 03/03/2018   Procedure: TRANSESOPHAGEAL ECHOCARDIOGRAM (TEE);  Surgeon: Sherren Mocha, MD;  Location: [facility];  Service: Open Heart Surgery;  Laterality: N/A;   TOTAL SHOULDER REVISION Right 10/29/2016   Procedure: RIGHT REVERSE TOTAL SHOULDER;  Surgeon: Meredith Pel, MD;   Location: [facility];  Service: Orthopedics;  Laterality: Right;   TRANSCATHETER AORTIC VALVE REPLACEMENT, TRANSFEMORAL N/A 03/03/2018   Procedure: TRANSCATHETER AORTIC VALVE REPLACEMENT, TRANSFEMORAL;  Surgeon: Sherren Mocha, MD;  Location: [facility];  Service: Open Heart Surgery;  Laterality: N/A;   Social History   Occupational History   Not on file  Tobacco Use   Smoking status: Never   Smokeless tobacco: Never  Vaping Use   Vaping Use: Never used  Substance and Sexual Activity   Alcohol use: No   Drug use: No   Sexual activity: Not on file

## 2022-02-23 ENCOUNTER — Encounter: Payer: Self-pay | Admitting: Orthopedic Surgery

## 2022-02-23 MED ORDER — METHYLPREDNISOLONE ACETATE 80 MG/ML IJ SUSP
80.0000 mg | INTRAMUSCULAR | Status: AC | PRN
  Administered 2022-02-20: 80 mg via INTRA_ARTICULAR

## 2022-02-23 MED ORDER — BUPIVACAINE HCL 0.25 % IJ SOLN
4.0000 mL | INTRAMUSCULAR | Status: AC | PRN
  Administered 2022-02-20: 4 mL via INTRA_ARTICULAR

## 2022-02-23 MED ORDER — LIDOCAINE HCL 1 % IJ SOLN
5.0000 mL | INTRAMUSCULAR | Status: AC | PRN
  Administered 2022-02-20: 5 mL

## 2022-02-26 ENCOUNTER — Telehealth: Payer: Self-pay | Admitting: Physical Medicine and Rehabilitation

## 2022-02-26 NOTE — Telephone Encounter (Signed)
Patient would like cx appointment for 1/31 she is feeling better

## 2022-02-26 NOTE — Telephone Encounter (Signed)
LVM informing patient that appointment was cancelled and to call us if she needs [place].

## 2022-02-27 ENCOUNTER — Ambulatory Visit: Payer: Medicare Other | Admitting: Physical Medicine and Rehabilitation

## 2022-03-19 DIAGNOSIS — H47011 Ischemic optic neuropathy, right eye: Secondary | ICD-10-CM | POA: Diagnosis not present

## 2022-03-19 DIAGNOSIS — H35373 Puckering of macula, bilateral: Secondary | ICD-10-CM | POA: Diagnosis not present

## 2022-03-19 DIAGNOSIS — H35363 Drusen (degenerative) of macula, bilateral: Secondary | ICD-10-CM | POA: Diagnosis not present

## 2022-03-19 DIAGNOSIS — E113591 Type 2 diabetes mellitus with proliferative diabetic retinopathy without macular edema, right eye: Secondary | ICD-10-CM | POA: Diagnosis not present

## 2022-04-19 NOTE — Progress Notes (Signed)
Date:  04/25/2022   ID:  Megan [name], DOB 07/23/1934, MRN [id number]  Provider Location: Office  PCP:  Merrilee Seashore, MD  Cardiologist:   Johnsie Cancel Electrophysiologist:  None   Evaluation Performed:  Follow-Up Visit  Chief Complaint:  AS/TAVR  History of Present Illness:    88 y.o. with severe AS Successful TAVR with 23 mm Sapien 3 TF approach 03/03/18. Post op echo with mean gradient elevated 19 mmHg No PVL Has seen Dr Lamonte Sakai for pulmonary HTN He hopes this improves with Rx of her left sided valve disease Cath done 01/29/18 prior to TAVR no CAD. Measured PA pressure on right heart cath only 33/12 with mean 22 mmHg. Mean PCWP 10 mmHg No CAD  No complaints Husband passed 8 years ago had been married 83 years Son lives with her and helps out Activity limited by arthritis in knees and back   Post TAVR echo 09/30/19 MS mean gradient 11 MVA 2 cm2 MAC with mild MR Not surgical candidate stable TAVR valve no AR mean gradient 15 mmHg peak 25 mmHg DVI 0.43 AVA 1.3 cm2   ECG with chronic RBBB  Monitor 04/09/19 with average HR 77 bpm <1% PAC/PVC''s   Has had both shoulders operated on and follows with Dr Marlou Sa Also getting left hip injections And has some broken hardware in left hip from fall ? Need for surgical revision   Long discussion about baroreceptor vagal responses as she feels this may contribute to her dizziness. Discussed that PPM wouldn't help this and offered another monitor but not clear to me that her dizzy spells are related and ECG stable with no evidence of higher grade block  TTE 04/09/21 reviewed EF> 75% Normal RV Mild MR moderate MS from MAC mean gradient 11 mmHg at average HR of 82 bpm  Doing well Activity limited by arthritis Discussed changing [place] to Diovan and beta blocker She is doing well with no dyspnea, palpitations and prefers to wait on any changes    Past Medical History:  Diagnosis Date   Anemia    Arthritis    Cancer ([place]) 1989   melanoma    Diabetes mellitus without complication (HCC)    Dizziness    GERD (gastroesophageal reflux disease)    History of hiatal hernia    Hypertension    Hypothyroidism    IBS (irritable bowel syndrome)    S/P TAVR (transcatheter aortic valve replacement) 03/03/2018   s/p 23 mm Edwards Sapien THV via the TF approach   Severe aortic stenosis    Past Surgical History:  Procedure Laterality Date   APPENDECTOMY     88 yrs old   CARPAL TUNNEL RELEASE Right 10/1999   Myerdierks, MD   CHOLECYSTECTOMY  89   COLONOSCOPY     EYE SURGERY Bilateral 2017   cataract surgery with lens implants   JOINT REPLACEMENT Right    knee    REVERSE SHOULDER ARTHROPLASTY Left 07/14/2014   REVERSE SHOULDER ARTHROPLASTY Left 07/14/2014   Procedure: LEFT REVERSE SHOULDER ARTHROPLASTY;  Surgeon: Meredith Pel, MD;  Location: [facility];  Service: Orthopedics;  Laterality: Left;   RIGHT/LEFT HEART CATH AND CORONARY ANGIOGRAPHY N/A 01/29/2018   Procedure: RIGHT/LEFT HEART CATH AND CORONARY ANGIOGRAPHY;  Surgeon: Belva Crome, MD;  Location: [place];  Service: Cardiovascular;  Laterality: N/A;   SHOULDER ARTHROSCOPY Left    TEE WITHOUT CARDIOVERSION N/A 03/03/2018   Procedure: TRANSESOPHAGEAL ECHOCARDIOGRAM (TEE);  Surgeon: Sherren Mocha, MD;  Location: [place] [facility];  Service: Open Heart Surgery;  Laterality: N/A;   TOTAL SHOULDER REVISION Right 10/29/2016   Procedure: RIGHT REVERSE TOTAL SHOULDER;  Surgeon: Meredith Pel, MD;  Location: [place];  Service: Orthopedics;  Laterality: Right;   TRANSCATHETER AORTIC VALVE REPLACEMENT, TRANSFEMORAL N/A 03/03/2018   Procedure: TRANSCATHETER AORTIC VALVE REPLACEMENT, TRANSFEMORAL;  Surgeon: Sherren Mocha, MD;  Location: [facility];  Service: Open Heart Surgery;  Laterality: N/A;     Current Meds  Medication Sig   ACCU-CHEK AVIVA PLUS test strip 1 each by Other route 2 (two) times daily.    ACCU-CHEK SOFTCLIX LANCETS lancets 1 each by Other route 2  (two) times daily.    acetaminophen (TYLENOL) 500 MG tablet Take 500-1,000 mg by mouth every 6 (six) hours as needed (pain.).   amLODipine-valsartan (EXFORGE) 5-160 MG tablet Take 1 tablet by mouth daily.   aspirin 81 MG chewable tablet Chew 1 tablet (81 mg total) by mouth daily.   calcium-vitamin D (OSCAL WITH D) 500-200 MG-UNIT tablet Take 1 tablet by mouth daily at 12 noon.    econazole nitrate 1 % cream Apply 1 application topically daily. For nose/corners of mouth   Ergocalciferol (VITAMIN D2 PO) Take 2,000 mg by mouth.   gabapentin (NEURONTIN) 100 MG capsule TAKE 1 CAPSULE BY MOUTH THREE TIMES A DAY   glimepiride (AMARYL) 2 MG tablet Take 2 mg by mouth See admin instructions. Take 2 mg by mouth in the morning and 4 mg by mouth at night.   hydrocortisone 2.5 % cream Apply 1 application topically daily. Applied to nose/corners of mouth   Multiple Vitamin (MULTIVITAMIN WITH MINERALS) TABS tablet Take 1 tablet by mouth daily at 12 noon.    MYRBETRIQ 50 MG TB24 tablet Take 50 mg by mouth daily.   Omega-3 Fatty Acids (FISH OIL) 1200 MG CAPS Take 1,200 mg by mouth daily at 12 noon.    rosuvastatin (CRESTOR) 5 MG tablet Take 5 mg by mouth daily.   sitaGLIPtin (JANUVIA) 100 MG tablet Take 100 mg by mouth daily.   SYNTHROID 125 MCG tablet Take 125 mcg by mouth daily before breakfast. Take 5 days a week   triamterene-hydrochlorothiazide (DYAZIDE) 37.5-25 MG per capsule Take 1 capsule by mouth at bedtime.     Allergies:   Patient has no known allergies.   Social History   Tobacco Use   Smoking status: Never   Smokeless tobacco: Never  Vaping Use   Vaping Use: Never used  Substance Use Topics   Alcohol use: No   Drug use: No     Family Hx: The patient's family history includes Heart disease in her brother and father; Hypertension in her father and mother.  ROS:   Please see the history of present illness.     All other systems reviewed and are negative.   Prior CV studies:   The  following studies were reviewed today:  Echo 04/09/18 EF >65% trivial PVL mean gradient 16 mmHg peak 33 mmHg Echo 03/10/19 see HPI Monitor  04/21/19   Labs/Other Tests and Data Reviewed:    EKG:  SR rate 73 RBBB  04/25/2022   Recent Labs: No results found for requested labs within last 365 days.   Recent Lipid Panel No results found for: "CHOL", "TRIG", "HDL", "CHOLHDL", "LDLCALC", "LDLDIRECT"  Wt Readings from Last 3 Encounters:  04/25/22 196 lb (88.9 kg)  03/26/21 191 lb (86.6 kg)  03/30/20 206 lb (93.4 kg)  Objective:    Vital Signs:  BP 138/78   Pulse 71   Ht 5\' 4"  (1.626 m)   Wt 196 lb (88.9 kg)   LMP  (LMP Unknown)   SpO2 96%   BMI 33.64 kg/m    Affect appropriate Healthy:  appears stated age 52: normal Neck supple with no adenopathy JVP normal no bruits no thyromegaly Lungs clear with no wheezing and good diaphragmatic motion Heart:  S1/S2 SEM no AR  murmur, no rub, gallop or click PMI normal Abdomen: benighn, BS positve, no tenderness, no AAA no bruit.  No HSM or HJR Distal pulses intact with no bruits No edema Neuro non-focal Skin warm and dry No muscular weakness   ASSESSMENT & PLAN:    AS/TAVR:  03/01/18 23 mm Edwards Sapien 3 DAT SBE prophylaxis. Stable by TTE 04/09/21  MS:  MAC with mean gradient 11 mmHg at HR 75 bpm  MVA 2 cm2 mild MR Not surgical candidate Discussed changing calcium blocker to beta blocker to lower HR And increase diastolic filling time She is doing well and asymptomatic and prefers to save this  Change for latter if she has new symptoms   HTN:  Well controlled.  Continue current medications and low sodium Dash type diet.  Currently on Dyazide and exforge   Thyroid:  On replacement TSH normal   DM:  Discussed low carb diet.  Target hemoglobin A1c is 6.5 or less.  Continue current medications.  HLD:  On crestor f/u labs with primary   Otho:  Post fall with displaced left hip hardware f/u ortho post injections    Dizziness:  infrequent episodes RBBB no change in ECG and previous monitor with no AV block or arrhythmia ? Vagal/baroreceptor involvement would be hard to diagnose and PPM would n't help if vasodepressor response     COVID-19 Education: The signs and symptoms of COVID-19 were discussed with the patient and how to seek care for testing (follow up with PCP or arrange E-visit).  The importance of social distancing was discussed today.   Medication Adjustments/Labs and Tests Ordered: Current medicines are reviewed at length with the patient today.  Concerns regarding medicines are outlined above.   Tests Ordered:  None  Medication Changes:  None   Disposition:  Follow up with cardiology in  6 months  Signed, Jenkins Rouge, MD  04/25/2022 9:38 AM    [place]

## 2022-04-25 ENCOUNTER — Ambulatory Visit: Payer: Medicare Other | Attending: Cardiovascular Disease | Admitting: Cardiovascular Disease

## 2022-04-25 ENCOUNTER — Encounter: Payer: Self-pay | Admitting: Cardiovascular Disease

## 2022-04-25 VITALS — BP 138/78 | HR 71 | Ht 64.0 in | Wt 196.0 lb

## 2022-04-25 DIAGNOSIS — I059 Rheumatic mitral valve disease, unspecified: Secondary | ICD-10-CM | POA: Diagnosis not present

## 2022-04-25 DIAGNOSIS — Z952 Presence of prosthetic heart valve: Secondary | ICD-10-CM | POA: Diagnosis not present

## 2022-04-25 NOTE — Patient Instructions (Signed)
Medication Instructions:  Your physician recommends that you continue on your current medications as directed. Please refer to the Current Medication list given to you today.  *If you need a refill on your cardiac medications before your next appointment, please call your pharmacy*  Lab Work: If you have labs (blood work) drawn today and your tests are completely normal, you will receive your results only by: MyChart Message (if you have MyChart) OR A paper copy in the mail If you have any lab test that is abnormal or we need to change your treatment, we will call you to review the results.  Testing/Procedures: None ordered today.  Follow-Up: At [facility], you and your health needs are our priority.  As part of our continuing mission to provide you with exceptional heart care, we have created designated Provider Care Teams.  These Care Teams include your primary Cardiologist (physician) and Advanced Practice Providers (APPs -  Physician Assistants and Nurse Practitioners) who all work together to provide you with the care you need, when you need it.  We recommend signing up for the patient portal called "MyChart".  Sign up information is provided on this After Visit Summary.  MyChart is used to connect with patients for Virtual Visits (Telemedicine).  Patients are able to view lab/test results, encounter notes, upcoming appointments, etc.  Non-urgent messages can be sent to your provider as well.   To learn more about what you can do with MyChart, go to [email or web address].    Your next appointment:   6 month(s)  Provider:   Peter Nishan, MD     

## 2022-05-14 ENCOUNTER — Telehealth: Payer: Self-pay | Admitting: Physical Medicine and Rehabilitation

## 2022-05-14 NOTE — Telephone Encounter (Signed)
Patient asking to get an appointment for an injection . Please advise

## 2022-05-16 ENCOUNTER — Other Ambulatory Visit: Payer: Self-pay | Admitting: Physical Medicine and Rehabilitation

## 2022-05-16 DIAGNOSIS — M5416 Radiculopathy, lumbar region: Secondary | ICD-10-CM

## 2022-05-16 DIAGNOSIS — M48062 Spinal stenosis, lumbar region with neurogenic claudication: Secondary | ICD-10-CM

## 2022-05-16 NOTE — Telephone Encounter (Signed)
Spoke with patient and she is requesting an injection. Patient is stating she is having pain in her lower back and radiating to both legs and right hip. Patient started having pain about a week ago and it worked at more than 50%. No new injuries, falls or accidents. Please advise

## 2022-05-30 ENCOUNTER — Other Ambulatory Visit: Payer: Self-pay

## 2022-05-30 ENCOUNTER — Ambulatory Visit: Payer: Medicare Other | Admitting: Physical Medicine and Rehabilitation

## 2022-05-30 VITALS — BP 134/69 | HR 96

## 2022-05-30 DIAGNOSIS — M5416 Radiculopathy, lumbar region: Secondary | ICD-10-CM

## 2022-05-30 DIAGNOSIS — M48062 Spinal stenosis, lumbar region with neurogenic claudication: Secondary | ICD-10-CM | POA: Diagnosis not present

## 2022-05-30 MED ORDER — METHYLPREDNISOLONE ACETATE 80 MG/ML IJ SUSP
80.0000 mg | Freq: Once | INTRAMUSCULAR | Status: AC
  Administered 2022-05-30: 80 mg

## 2022-05-30 NOTE — Patient Instructions (Signed)

## 2022-05-30 NOTE — Progress Notes (Signed)
Functional Pain Scale - descriptive words and definitions  Distracting (5)    Aware of pain/able to complete some ADL's but limited by pain/sleep is affected and active distractions are only slightly useful. Moderate range order  Average Pain 7   +Driver, -BT, -Dye Allergies.  Lower back pain on both sides with pain from the knees to the ankles

## 2022-06-11 DIAGNOSIS — H35039 Hypertensive retinopathy, unspecified eye: Secondary | ICD-10-CM | POA: Diagnosis not present

## 2022-06-11 DIAGNOSIS — D692 Other nonthrombocytopenic purpura: Secondary | ICD-10-CM | POA: Diagnosis not present

## 2022-06-11 DIAGNOSIS — E1165 Type 2 diabetes mellitus with hyperglycemia: Secondary | ICD-10-CM | POA: Diagnosis not present

## 2022-06-11 DIAGNOSIS — I700 Atherosclerosis of aorta: Secondary | ICD-10-CM | POA: Diagnosis not present

## 2022-06-11 DIAGNOSIS — N1831 Chronic kidney disease, stage 3a: Secondary | ICD-10-CM | POA: Diagnosis not present

## 2022-06-11 NOTE — Progress Notes (Signed)
Megan [name] - 88 y.o. female MRN [id number]  Date of birth: [birth year]  Office Visit Note: Visit Date: 05/30/2022 PCP: Georgianne Fick, MD Referred by: Georgianne Fick, MD  Subjective: Chief Complaint  Patient presents with   Lower Back - Pain   HPI:  Megan [name] is a 88 y.o. female who comes in today at the request of Ellin Goodie, FNP and Dr. Marrianne Mood Dean for planned Bilateral L4-5 Lumbar Transforaminal epidural steroid injection with fluoroscopic guidance.  The patient has failed conservative care including home exercise, medications, time and activity modification.  This injection will be diagnostic and hopefully therapeutic.  Please see requesting physician notes for further details and justification.   ROS Otherwise per HPI.  Assessment & Plan: Visit Diagnoses:    ICD-10-CM   1. Lumbar radiculopathy  M54.16 XR C-ARM NO REPORT    Epidural Steroid injection    methylPREDNISolone acetate (DEPO-MEDROL) injection 80 mg    2. Spinal stenosis of lumbar region with neurogenic claudication  M48.062 XR C-ARM NO REPORT    Epidural Steroid injection    methylPREDNISolone acetate (DEPO-MEDROL) injection 80 mg      Plan: No additional findings.   Meds & Orders:  Meds ordered this encounter  Medications   methylPREDNISolone acetate (DEPO-MEDROL) injection 80 mg    Orders Placed This Encounter  Procedures   XR C-ARM NO REPORT   Epidural Steroid injection    Follow-up: Return for visit to requesting provider as needed.   Procedures: No procedures performed  Lumbosacral Transforaminal Epidural Steroid Injection - Sub-Pedicular Approach with Fluoroscopic Guidance  [name]      Date of Birth: [birth year] MRN: [id number] PCP: Georgianne Fick, MD      Visit Date: 05/30/2022   Universal Protocol:    Date/Time: 05/30/2022  Consent Given By: the patient  Position: PRONE  Additional Comments: Vital signs were monitored before and after the  procedure. Patient was prepped and draped in the usual sterile fashion. The correct patient, procedure, and site was verified.   Injection Procedure Details:   Procedure diagnoses: Lumbar radiculopathy [M54.16]    Meds Administered:  Meds ordered this encounter  Medications   methylPREDNISolone acetate (DEPO-MEDROL) injection 80 mg    Laterality: Bilateral  Location/Site: L4  Needle:5.0 in., 22 ga.  Short bevel or Quincke spinal needle  Needle Placement: Transforaminal  Findings:    -Comments: Excellent flow of contrast along the nerve, nerve root and into the epidural space.  Procedure Details: After squaring off the end-plates to get a true AP view, the C-arm was positioned so that an oblique view of the foramen as noted above was visualized. The target area is just inferior to the "nose of the scotty dog" or sub pedicular. The soft tissues overlying this structure were infiltrated with 2-3 ml. of 1% Lidocaine without Epinephrine.  The spinal needle was inserted toward the target using a "trajectory" view along the fluoroscope beam.  Under AP and lateral visualization, the needle was advanced so it did not puncture dura and was located close the 6 O'Clock position of the pedical in AP tracterory. Biplanar projections were used to confirm position. Aspiration was confirmed to be negative for CSF and/or blood. A 1-2 ml. volume of Isovue-250 was injected and flow of contrast was noted at each level. Radiographs were obtained for documentation purposes.   After attaining the desired flow of contrast documented above, a 0.5 to 1.0 ml test dose of 0.25% Marcaine was injected  into each respective transforaminal space.  The patient was observed for 90 seconds post injection.  After no sensory deficits were reported, and normal lower extremity motor function was noted,   the above injectate was administered so that equal amounts of the injectate were placed at each foramen (level) into the  transforaminal epidural space.   Additional Comments:  No complications occurred Dressing: 2 x 2 sterile gauze and Band-Aid    Post-procedure details: Patient was observed during the procedure. Post-procedure instructions were reviewed.  Patient left the clinic in stable condition.    Clinical History: EXAM: MRI LUMBAR SPINE WITHOUT CONTRAST   TECHNIQUE: Multiplanar, multisequence MR imaging of the lumbar spine was performed. No intravenous contrast was administered.   COMPARISON:  11/26/2016   FINDINGS: Segmentation:  5 lumbar type vertebrae   Alignment:  Degenerative grade 1 anterolisthesis at L4-5 and L5-S1.   Vertebrae: No fracture, evidence of discitis, or bone lesion. No pars defects on a 2020 CT of the abdomen.   Conus medullaris and cauda equina: Conus extends to the L1-2 level. Conus and cauda equina appear normal.   Paraspinal and other soft tissues: Negative for perispinal mass or inflammation.   Disc levels:   T12- L1: Unremarkable.   L1-L2: Ventral spondylitic spurring   L2-L3: Facet spurring with disc narrowing and bulging. Mild left foraminal narrowing   L3-L4: Facet osteoarthritis with spurring and ligamentum flavum thickening. Circumferential disc bulging. Moderate spinal stenosis which is progressed. Mild-to-moderate bilateral foraminal narrowing   L4-L5: Facet osteoarthritis with bulky spurring and ligamentum flavum thickening. Anterolisthesis. The disc is narrowed and bulging. Severe spinal stenosis with noncompressive bilateral foraminal narrowing   L5-S1:Facet osteoarthritis with spurring and mild anterolisthesis. Disc space narrowing and bulging with patent foramina. Bilateral subarticular recess narrowing that could affect the S1 nerve roots.   IMPRESSION: 1. Mild progression at L3-4, otherwise stable spinal degeneration affecting L3-4 and below. Anterolisthesis at L4-5 and L5-S1. 2. Spinal stenosis that is severe at L4-5 and  moderate at L3-4. 3. L5-S1 bilateral subarticular recess narrowing encroaching on the descending S1 nerve roots.     Electronically Signed   By: Marnee Spring M.D.   On: 10/01/2020 04:20     Objective:  VS:  HT:    WT:   BMI:     BP:134/69  HR:96bpm  TEMP: ( )  RESP:  Physical Exam Vitals and nursing note reviewed.  Constitutional:      General: She is not in acute distress.    Appearance: Normal appearance. She is not ill-appearing.  HENT:     Head: Normocephalic and atraumatic.     Right Ear: External ear normal.     Left Ear: External ear normal.  Eyes:     Extraocular Movements: Extraocular movements intact.  Cardiovascular:     Rate and Rhythm: Normal rate.     Pulses: Normal pulses.  Pulmonary:     Effort: Pulmonary effort is normal. No respiratory distress.  Abdominal:     General: There is no distension.     Palpations: Abdomen is soft.  Musculoskeletal:        General: Tenderness present.     Cervical back: Neck supple.     Right lower leg: No edema.     Left lower leg: No edema.     Comments: Patient has good distal strength with no pain over the greater trochanters.  No clonus or focal weakness.  Skin:    Findings: No erythema, lesion or rash.  Neurological:     General: No focal deficit present.     Mental Status: She is alert and oriented to person, place, and time.     Sensory: No sensory deficit.     Motor: No weakness or abnormal muscle tone.     Coordination: Coordination normal.  Psychiatric:        Mood and Affect: Mood normal.        Behavior: Behavior normal.      Imaging: No results found.

## 2022-06-11 NOTE — Procedures (Signed)
Lumbosacral Transforaminal Epidural Steroid Injection - Sub-Pedicular Approach with Fluoroscopic Guidance  [name]      Date of Birth: 08/18/1934 MRN: [id number] PCP: Georgianne Fick, MD      Visit Date: 05/30/2022   Universal Protocol:    Date/Time: 05/30/2022  Consent Given By: the patient  Position: PRONE  Additional Comments: Vital signs were monitored before and after the procedure. Patient was prepped and draped in the usual sterile fashion. The correct patient, procedure, and site was verified.   Injection Procedure Details:   Procedure diagnoses: Lumbar radiculopathy [M54.16]    Meds Administered:  Meds ordered this encounter  Medications   methylPREDNISolone acetate (DEPO-MEDROL) injection 80 mg    Laterality: Bilateral  Location/Site: L4  Needle:5.0 in., 22 ga.  Short bevel or Quincke spinal needle  Needle Placement: Transforaminal  Findings:    -Comments: Excellent flow of contrast along the nerve, nerve root and into the epidural space.  Procedure Details: After squaring off the end-plates to get a true AP view, the C-arm was positioned so that an oblique view of the foramen as noted above was visualized. The target area is just inferior to the "nose of the scotty dog" or sub pedicular. The soft tissues overlying this structure were infiltrated with 2-3 ml. of 1% Lidocaine without Epinephrine.  The spinal needle was inserted toward the target using a "trajectory" view along the fluoroscope beam.  Under AP and lateral visualization, the needle was advanced so it did not puncture dura and was located close the 6 O'Clock position of the pedical in AP tracterory. Biplanar projections were used to confirm position. Aspiration was confirmed to be negative for CSF and/or blood. A 1-2 ml. volume of Isovue-250 was injected and flow of contrast was noted at each level. Radiographs were obtained for documentation purposes.   After attaining the desired  flow of contrast documented above, a 0.5 to 1.0 ml test dose of 0.25% Marcaine was injected into each respective transforaminal space.  The patient was observed for 90 seconds post injection.  After no sensory deficits were reported, and normal lower extremity motor function was noted,   the above injectate was administered so that equal amounts of the injectate were placed at each foramen (level) into the transforaminal epidural space.   Additional Comments:  No complications occurred Dressing: 2 x 2 sterile gauze and Band-Aid    Post-procedure details: Patient was observed during the procedure. Post-procedure instructions were reviewed.  Patient left the clinic in stable condition.

## 2022-06-18 DIAGNOSIS — E113293 Type 2 diabetes mellitus with mild nonproliferative diabetic retinopathy without macular edema, bilateral: Secondary | ICD-10-CM | POA: Diagnosis not present

## 2022-06-18 DIAGNOSIS — I700 Atherosclerosis of aorta: Secondary | ICD-10-CM | POA: Diagnosis not present

## 2022-06-18 DIAGNOSIS — I10 Essential (primary) hypertension: Secondary | ICD-10-CM | POA: Diagnosis not present

## 2022-06-18 DIAGNOSIS — N1831 Chronic kidney disease, stage 3a: Secondary | ICD-10-CM | POA: Diagnosis not present

## 2022-06-18 DIAGNOSIS — D692 Other nonthrombocytopenic purpura: Secondary | ICD-10-CM | POA: Diagnosis not present

## 2022-06-18 DIAGNOSIS — E039 Hypothyroidism, unspecified: Secondary | ICD-10-CM | POA: Diagnosis not present

## 2022-06-18 DIAGNOSIS — N3281 Overactive bladder: Secondary | ICD-10-CM | POA: Diagnosis not present

## 2022-06-18 DIAGNOSIS — E782 Mixed hyperlipidemia: Secondary | ICD-10-CM | POA: Diagnosis not present

## 2022-06-18 DIAGNOSIS — Z0000 Encounter for general adult medical examination without abnormal findings: Secondary | ICD-10-CM | POA: Diagnosis not present

## 2022-06-18 DIAGNOSIS — H35039 Hypertensive retinopathy, unspecified eye: Secondary | ICD-10-CM | POA: Diagnosis not present

## 2022-06-18 DIAGNOSIS — I2510 Atherosclerotic heart disease of native coronary artery without angina pectoris: Secondary | ICD-10-CM | POA: Diagnosis not present

## 2022-06-28 DIAGNOSIS — E782 Mixed hyperlipidemia: Secondary | ICD-10-CM | POA: Diagnosis not present

## 2022-06-28 DIAGNOSIS — E113293 Type 2 diabetes mellitus with mild nonproliferative diabetic retinopathy without macular edema, bilateral: Secondary | ICD-10-CM | POA: Diagnosis not present

## 2022-06-28 DIAGNOSIS — H35039 Hypertensive retinopathy, unspecified eye: Secondary | ICD-10-CM | POA: Diagnosis not present

## 2022-06-28 DIAGNOSIS — E1165 Type 2 diabetes mellitus with hyperglycemia: Secondary | ICD-10-CM | POA: Diagnosis not present

## 2022-06-28 DIAGNOSIS — N1831 Chronic kidney disease, stage 3a: Secondary | ICD-10-CM | POA: Diagnosis not present

## 2022-06-28 DIAGNOSIS — I10 Essential (primary) hypertension: Secondary | ICD-10-CM | POA: Diagnosis not present

## 2022-06-28 DIAGNOSIS — I2510 Atherosclerotic heart disease of native coronary artery without angina pectoris: Secondary | ICD-10-CM | POA: Diagnosis not present

## 2022-06-28 DIAGNOSIS — E039 Hypothyroidism, unspecified: Secondary | ICD-10-CM | POA: Diagnosis not present

## 2022-07-15 ENCOUNTER — Telehealth: Payer: Self-pay | Admitting: Orthopedic Surgery

## 2022-07-15 NOTE — Telephone Encounter (Signed)
Ms. Gaba states that she would like to speak with Dr. [place] Blas team about her pain. She feels it has gotten worse and she would like to know if Dr. Alvester Morin thinks she needs another injection.  Please call at [phone].

## 2022-07-18 ENCOUNTER — Other Ambulatory Visit: Payer: Self-pay | Admitting: Physical Medicine and Rehabilitation

## 2022-07-18 DIAGNOSIS — M48062 Spinal stenosis, lumbar region with neurogenic claudication: Secondary | ICD-10-CM

## 2022-07-18 DIAGNOSIS — M5416 Radiculopathy, lumbar region: Secondary | ICD-10-CM

## 2022-07-18 MED ORDER — TRAMADOL HCL 50 MG PO TABS: 50.0000 mg | ORAL_TABLET | Freq: Three times a day (TID) | ORAL | 0 refills | Status: DC | PRN

## 2022-07-18 NOTE — Telephone Encounter (Signed)
Patient would like to try medication. Patient informed of MRI order placed

## 2022-07-18 NOTE — Telephone Encounter (Signed)
Spoke with patient and she is having pain again. She got an injection on 05/30/22 and the pain came back about a week ago. She says she has pain with pressure and when standing. She wants to know if she needs another injection or if anything else needs to be done. Please advise

## 2022-08-11 ENCOUNTER — Ambulatory Visit
Admission: RE | Admit: 2022-08-11 | Discharge: 2022-08-11 | Disposition: A | Discharge status: Home / Self Care | Payer: Medicare Other | Source: Ambulatory Visit | Attending: Physical Medicine and Rehabilitation | Admitting: Physical Medicine and Rehabilitation

## 2022-08-11 DIAGNOSIS — M48061 Spinal stenosis, lumbar region without neurogenic claudication: Secondary | ICD-10-CM | POA: Diagnosis not present

## 2022-08-11 DIAGNOSIS — M47816 Spondylosis without myelopathy or radiculopathy, lumbar region: Secondary | ICD-10-CM | POA: Diagnosis not present

## 2022-08-11 DIAGNOSIS — M4316 Spondylolisthesis, lumbar region: Secondary | ICD-10-CM | POA: Diagnosis not present

## 2022-08-11 DIAGNOSIS — S32049A Unspecified fracture of fourth lumbar vertebra, initial encounter for closed fracture: Secondary | ICD-10-CM | POA: Diagnosis not present

## 2022-08-11 DIAGNOSIS — M48062 Spinal stenosis, lumbar region with neurogenic claudication: Secondary | ICD-10-CM

## 2022-08-11 DIAGNOSIS — M5416 Radiculopathy, lumbar region: Secondary | ICD-10-CM

## 2022-08-12 ENCOUNTER — Other Ambulatory Visit: Payer: Self-pay | Admitting: Radiology

## 2022-08-12 MED ORDER — GABAPENTIN 100 MG PO CAPS: ORAL_CAPSULE | ORAL | 1 refills | Status: DC

## 2022-08-12 NOTE — Telephone Encounter (Signed)
Received fax request for Gabapentin 100mg  capsule from pharmacy.    Please advise, ok to refill?

## 2022-08-19 ENCOUNTER — Other Ambulatory Visit: Payer: Self-pay | Admitting: Physical Medicine and Rehabilitation

## 2022-08-19 DIAGNOSIS — M5416 Radiculopathy, lumbar region: Secondary | ICD-10-CM

## 2022-08-19 DIAGNOSIS — M48062 Spinal stenosis, lumbar region with neurogenic claudication: Secondary | ICD-10-CM

## 2022-08-19 NOTE — Progress Notes (Signed)
Called patient this afternoon to discuss recent lumbar MRI imaging, minimal changes from prior imaging in 2022. Severe multi factorial spinal canal stenosis at L3-L4 and L4-L5. Patient is requesting repeat injection at this time as her pain has gradually increased over the last several weeks. Prior bilateral L4 transforaminal epidural steroid injection on 05/30/2022 seemed to help alleviate pain for almost 3 months.

## 2022-08-21 DIAGNOSIS — E039 Hypothyroidism, unspecified: Secondary | ICD-10-CM | POA: Diagnosis not present

## 2022-08-26 ENCOUNTER — Telehealth: Payer: Self-pay

## 2022-08-26 ENCOUNTER — Other Ambulatory Visit: Payer: Self-pay | Admitting: Physical Medicine and Rehabilitation

## 2022-08-26 MED ORDER — TRAMADOL HCL 50 MG PO TABS: 50.0000 mg | ORAL_TABLET | Freq: Three times a day (TID) | ORAL | 0 refills | Status: AC | PRN

## 2022-08-26 NOTE — Telephone Encounter (Signed)
Spoke with patient and she is requesting medication for pain as Tylenol is not working. Her injection is scheduled for 09/05/22. Please advise

## 2022-08-28 DIAGNOSIS — N3281 Overactive bladder: Secondary | ICD-10-CM | POA: Diagnosis not present

## 2022-08-28 DIAGNOSIS — E782 Mixed hyperlipidemia: Secondary | ICD-10-CM | POA: Diagnosis not present

## 2022-08-28 DIAGNOSIS — I2510 Atherosclerotic heart disease of native coronary artery without angina pectoris: Secondary | ICD-10-CM | POA: Diagnosis not present

## 2022-08-28 DIAGNOSIS — N1831 Chronic kidney disease, stage 3a: Secondary | ICD-10-CM | POA: Diagnosis not present

## 2022-08-28 DIAGNOSIS — I10 Essential (primary) hypertension: Secondary | ICD-10-CM | POA: Diagnosis not present

## 2022-08-28 DIAGNOSIS — E039 Hypothyroidism, unspecified: Secondary | ICD-10-CM | POA: Diagnosis not present

## 2022-08-28 DIAGNOSIS — H35039 Hypertensive retinopathy, unspecified eye: Secondary | ICD-10-CM | POA: Diagnosis not present

## 2022-08-28 DIAGNOSIS — I700 Atherosclerosis of aorta: Secondary | ICD-10-CM | POA: Diagnosis not present

## 2022-08-28 DIAGNOSIS — D692 Other nonthrombocytopenic purpura: Secondary | ICD-10-CM | POA: Diagnosis not present

## 2022-08-28 DIAGNOSIS — E113293 Type 2 diabetes mellitus with mild nonproliferative diabetic retinopathy without macular edema, bilateral: Secondary | ICD-10-CM | POA: Diagnosis not present

## 2022-09-05 ENCOUNTER — Encounter: Payer: Medicare Other | Admitting: Physical Medicine and Rehabilitation

## 2022-09-17 DIAGNOSIS — H47011 Ischemic optic neuropathy, right eye: Secondary | ICD-10-CM | POA: Diagnosis not present

## 2022-09-19 ENCOUNTER — Other Ambulatory Visit: Payer: Self-pay

## 2022-09-19 ENCOUNTER — Ambulatory Visit: Payer: Medicare Other | Admitting: Physical Medicine and Rehabilitation

## 2022-09-19 VITALS — BP 155/72 | HR 80

## 2022-09-19 DIAGNOSIS — M5416 Radiculopathy, lumbar region: Secondary | ICD-10-CM | POA: Diagnosis not present

## 2022-09-19 MED ORDER — METHYLPREDNISOLONE ACETATE 80 MG/ML IJ SUSP
80.0000 mg | Freq: Once | INTRAMUSCULAR | Status: AC
  Administered 2022-09-19: 80 mg

## 2022-09-19 NOTE — Patient Instructions (Signed)

## 2022-09-19 NOTE — Progress Notes (Signed)
Functional Pain Scale - descriptive words and definitions  Unmanageable (7)  Pain interferes with normal ADL's/nothing seems to help/sleep is very difficult/active distractions are very difficult to concentrate on. Severe range order  Average Pain 9-10   +Driver, -BT, -Dye Allergies.  Lower back pain on both sides that radiates into both legs

## 2022-09-23 DIAGNOSIS — D225 Melanocytic nevi of trunk: Secondary | ICD-10-CM | POA: Diagnosis not present

## 2022-09-23 DIAGNOSIS — L570 Actinic keratosis: Secondary | ICD-10-CM | POA: Diagnosis not present

## 2022-09-23 DIAGNOSIS — L814 Other melanin hyperpigmentation: Secondary | ICD-10-CM | POA: Diagnosis not present

## 2022-09-23 DIAGNOSIS — D492 Neoplasm of unspecified behavior of bone, soft tissue, and skin: Secondary | ICD-10-CM | POA: Diagnosis not present

## 2022-09-23 DIAGNOSIS — B079 Viral wart, unspecified: Secondary | ICD-10-CM | POA: Diagnosis not present

## 2022-09-23 DIAGNOSIS — L821 Other seborrheic keratosis: Secondary | ICD-10-CM | POA: Diagnosis not present

## 2022-09-28 NOTE — Procedures (Signed)
Lumbosacral Transforaminal Epidural Steroid Injection - Sub-Pedicular Approach with Fluoroscopic Guidance  [name]      Date of Birth: Dec 10, 1934 MRN: [id number] PCP: Georgianne Fick, MD      Visit Date: 09/19/2022   Universal Protocol:    Date/Time: 09/19/2022  Consent Given By: the patient  Position: PRONE  Additional Comments: Vital signs were monitored before and after the procedure. Patient was prepped and draped in the usual sterile fashion. The correct patient, procedure, and site was verified.   Injection Procedure Details:   Procedure diagnoses: Lumbar radiculopathy [M54.16]    Meds Administered:  Meds ordered this encounter  Medications   methylPREDNISolone acetate (DEPO-MEDROL) injection 80 mg    Laterality: Bilateral  Location/Site: L4  Needle:5.0 in., 22 ga.  Short bevel or Quincke spinal needle  Needle Placement: Transforaminal  Findings:    -Comments: Excellent flow of contrast along the nerve, nerve root and into the epidural space.  Procedure Details: After squaring off the end-plates to get a true AP view, the C-arm was positioned so that an oblique view of the foramen as noted above was visualized. The target area is just inferior to the "nose of the scotty dog" or sub pedicular. The soft tissues overlying this structure were infiltrated with 2-3 ml. of 1% Lidocaine without Epinephrine.  The spinal needle was inserted toward the target using a "trajectory" view along the fluoroscope beam.  Under AP and lateral visualization, the needle was advanced so it did not puncture dura and was located close the 6 O'Clock position of the pedical in AP tracterory. Biplanar projections were used to confirm position. Aspiration was confirmed to be negative for CSF and/or blood. A 1-2 ml. volume of Isovue-250 was injected and flow of contrast was noted at each level. Radiographs were obtained for documentation purposes.   After attaining the desired  flow of contrast documented above, a 0.5 to 1.0 ml test dose of 0.25% Marcaine was injected into each respective transforaminal space.  The patient was observed for 90 seconds post injection.  After no sensory deficits were reported, and normal lower extremity motor function was noted,   the above injectate was administered so that equal amounts of the injectate were placed at each foramen (level) into the transforaminal epidural space.   Additional Comments:  No complications occurred Dressing: 2 x 2 sterile gauze and Band-Aid    Post-procedure details: Patient was observed during the procedure. Post-procedure instructions were reviewed.  Patient left the clinic in stable condition.

## 2022-09-28 NOTE — Progress Notes (Signed)
Megan [name] - 88 y.o. female MRN [id number]  Date of birth: [birth year]  Office Visit Note: Visit Date: 09/19/2022 PCP: Georgianne Fick, MD Referred by: Georgianne Fick, MD  Subjective: Chief Complaint  Patient presents with   Lower Back - Pain   HPI:  Megan [name] is a 88 y.o. female who comes in today for planned repeat Bilateral L4-5  Lumbar Transforaminal epidural steroid injection with fluoroscopic guidance.  The patient has failed conservative care including home exercise, medications, time and activity modification.  This injection will be diagnostic and hopefully therapeutic.  Please see requesting physician notes for further details and justification. Patient received more than 50% pain relief from prior injection.   Referring: Ellin Goodie, FNP   ROS Otherwise per HPI.  Assessment & Plan: Visit Diagnoses:    ICD-10-CM   1. Lumbar radiculopathy  M54.16 XR C-ARM NO REPORT    Epidural Steroid injection    methylPREDNISolone acetate (DEPO-MEDROL) injection 80 mg      Plan: No additional findings.   Meds & Orders:  Meds ordered this encounter  Medications   methylPREDNISolone acetate (DEPO-MEDROL) injection 80 mg    Orders Placed This Encounter  Procedures   XR C-ARM NO REPORT   Epidural Steroid injection    Follow-up: Return for visit to requesting provider as needed.   Procedures: No procedures performed  Lumbosacral Transforaminal Epidural Steroid Injection - Sub-Pedicular Approach with Fluoroscopic Guidance  [name]      Date of Birth: 05-May-1934 MRN: [id number] PCP: Georgianne Fick, MD      Visit Date: 09/19/2022   Universal Protocol:    Date/Time: 09/19/2022  Consent Given By: the patient  Position: PRONE  Additional Comments: Vital signs were monitored before and after the procedure. Patient was prepped and draped in the usual sterile fashion. The correct patient, procedure, and site was verified.   Injection  Procedure Details:   Procedure diagnoses: Lumbar radiculopathy [M54.16]    Meds Administered:  Meds ordered this encounter  Medications   methylPREDNISolone acetate (DEPO-MEDROL) injection 80 mg    Laterality: Bilateral  Location/Site: L4  Needle:5.0 in., 22 ga.  Short bevel or Quincke spinal needle  Needle Placement: Transforaminal  Findings:    -Comments: Excellent flow of contrast along the nerve, nerve root and into the epidural space.  Procedure Details: After squaring off the end-plates to get a true AP view, the C-arm was positioned so that an oblique view of the foramen as noted above was visualized. The target area is just inferior to the "nose of the scotty dog" or sub pedicular. The soft tissues overlying this structure were infiltrated with 2-3 ml. of 1% Lidocaine without Epinephrine.  The spinal needle was inserted toward the target using a "trajectory" view along the fluoroscope beam.  Under AP and lateral visualization, the needle was advanced so it did not puncture dura and was located close the 6 O'Clock position of the pedical in AP tracterory. Biplanar projections were used to confirm position. Aspiration was confirmed to be negative for CSF and/or blood. A 1-2 ml. volume of Isovue-250 was injected and flow of contrast was noted at each level. Radiographs were obtained for documentation purposes.   After attaining the desired flow of contrast documented above, a 0.5 to 1.0 ml test dose of 0.25% Marcaine was injected into each respective transforaminal space.  The patient was observed for 90 seconds post injection.  After no sensory deficits were reported, and normal lower extremity motor  function was noted,   the above injectate was administered so that equal amounts of the injectate were placed at each foramen (level) into the transforaminal epidural space.   Additional Comments:  No complications occurred Dressing: 2 x 2 sterile gauze and Band-Aid     Post-procedure details: Patient was observed during the procedure. Post-procedure instructions were reviewed.  Patient left the clinic in stable condition.    Clinical History: CLINICAL DATA:  Low back pain. Symptoms persist with greater than 6 weeks of treatment. Low back and bilateral leg pain.   EXAM: MRI LUMBAR SPINE WITHOUT CONTRAST   TECHNIQUE: Multiplanar, multisequence MR imaging of the lumbar spine was performed. No intravenous contrast was administered.   COMPARISON:  09/30/2020   FINDINGS: Segmentation: Five lumbar type vertebral bodies as numbered previously.   Alignment: 6-7 mm degenerative anterolisthesis at the L4-5 level. 3-4 mm degenerative anterolisthesis at the L5-S1 level.   Vertebrae: Mild superior endplate depression at L4 to the right of midline consistent with a minor compression injury. No other focal bone finding.   Conus medullaris and cauda equina: Conus extends to the L1-2 level. Conus and cauda equina appear normal.   Paraspinal and other soft tissues: Negative   Disc levels:   No significant finding from T11-12 through L2-3: Mild noncompressive disc bulges.   L3-4: Severe multifactorial spinal stenosis. Shallow protrusion of the disc. Pronounced facet and ligamentous hypertrophy. Similar appearance or slight worsening compared to the study of 2022.   L4-5: Bilateral facet arthropathy with 6-7 mm of degenerative anterolisthesis. Bulging of the disc. Severe multifactorial spinal stenosis, similar to the study of 2022.   L5-S1: Bilateral facet degeneration with 3-4 mm of degenerative anterolisthesis. Mild bulging of the disc. No central canal stenosis. Stenosis of the subarticular lateral recesses could plates the S1 nerves at risk. The L5 nerves appear to exit the foramen without compression. Similar to the study of 2022.   IMPRESSION: 1. Severe multifactorial spinal stenosis at L3-4 and L4-5, similar to the study of 2022. 2.  Mild superior endplate fracture at L4 to the right of midline consistent with a minor compression injury. This could certainly relate to low back pain. 3. L5-S1 facet arthropathy with 3-4 mm of anterolisthesis. Mild bulging of the disc. Narrowing of the subarticular lateral recesses that could possibly affect the S1 nerves. Similar to the study of 2022.     Electronically Signed   By: Paulina Fusi M.D.   On: 08/19/2022 14:14     Objective:  VS:  HT:    WT:   BMI:     BP:(!) 155/72  HR:80bpm  TEMP: ( )  RESP:  Physical Exam Vitals and nursing note reviewed.  Constitutional:      General: She is not in acute distress.    Appearance: Normal appearance. She is not ill-appearing.  HENT:     Head: Normocephalic and atraumatic.     Right Ear: External ear normal.     Left Ear: External ear normal.  Eyes:     Extraocular Movements: Extraocular movements intact.  Cardiovascular:     Rate and Rhythm: Normal rate.     Pulses: Normal pulses.  Pulmonary:     Effort: Pulmonary effort is normal. No respiratory distress.  Abdominal:     General: There is no distension.     Palpations: Abdomen is soft.  Musculoskeletal:        General: Tenderness present.     Cervical back: Neck supple.  Right lower leg: No edema.     Left lower leg: No edema.     Comments: Patient has good distal strength with no pain over the greater trochanters.  No clonus or focal weakness.  Skin:    Findings: No erythema, lesion or rash.  Neurological:     General: No focal deficit present.     Mental Status: She is alert and oriented to person, place, and time.     Sensory: No sensory deficit.     Motor: No weakness or abnormal muscle tone.     Coordination: Coordination normal.  Psychiatric:        Mood and Affect: Mood normal.        Behavior: Behavior normal.      Imaging: No results found.

## 2022-10-01 DIAGNOSIS — E1165 Type 2 diabetes mellitus with hyperglycemia: Secondary | ICD-10-CM | POA: Diagnosis not present

## 2022-10-07 ENCOUNTER — Telehealth: Payer: Self-pay | Admitting: Physical Medicine and Rehabilitation

## 2022-10-07 NOTE — Telephone Encounter (Signed)
Patient called advised the injection did not work and wanted to know what the next step for her care is? The number to contact patient is [phone]

## 2022-10-08 DIAGNOSIS — E1165 Type 2 diabetes mellitus with hyperglycemia: Secondary | ICD-10-CM | POA: Diagnosis not present

## 2022-10-08 DIAGNOSIS — E113293 Type 2 diabetes mellitus with mild nonproliferative diabetic retinopathy without macular edema, bilateral: Secondary | ICD-10-CM | POA: Diagnosis not present

## 2022-10-08 DIAGNOSIS — E782 Mixed hyperlipidemia: Secondary | ICD-10-CM | POA: Diagnosis not present

## 2022-10-08 DIAGNOSIS — H35039 Hypertensive retinopathy, unspecified eye: Secondary | ICD-10-CM | POA: Diagnosis not present

## 2022-10-08 DIAGNOSIS — E039 Hypothyroidism, unspecified: Secondary | ICD-10-CM | POA: Diagnosis not present

## 2022-10-09 NOTE — Telephone Encounter (Signed)
Spoke with patient and scheduled OV for 10/10/22.

## 2022-10-10 ENCOUNTER — Encounter: Payer: Self-pay | Admitting: Physical Medicine and Rehabilitation

## 2022-10-10 ENCOUNTER — Ambulatory Visit: Payer: Medicare Other | Admitting: Physical Medicine and Rehabilitation

## 2022-10-10 DIAGNOSIS — G8929 Other chronic pain: Secondary | ICD-10-CM | POA: Diagnosis not present

## 2022-10-10 DIAGNOSIS — M5442 Lumbago with sciatica, left side: Secondary | ICD-10-CM

## 2022-10-10 DIAGNOSIS — M48062 Spinal stenosis, lumbar region with neurogenic claudication: Secondary | ICD-10-CM

## 2022-10-10 DIAGNOSIS — M5441 Lumbago with sciatica, right side: Secondary | ICD-10-CM | POA: Diagnosis not present

## 2022-10-10 DIAGNOSIS — R269 Unspecified abnormalities of gait and mobility: Secondary | ICD-10-CM | POA: Diagnosis not present

## 2022-10-10 DIAGNOSIS — M5416 Radiculopathy, lumbar region: Secondary | ICD-10-CM | POA: Diagnosis not present

## 2022-10-10 MED ORDER — DULOXETINE HCL 30 MG PO CPEP: ORAL_CAPSULE | ORAL | 3 refills | Status: DC

## 2022-10-10 NOTE — Progress Notes (Signed)
Functional Pain Scale - descriptive words and definitions  Moderate (4)   Constantly aware of pain, can complete ADLs with modification/sleep marginally affected at times/passive distraction is of no use, but active distraction gives some relief. Moderate range order  Average Pain 6-7  Lower back pain. Pain is the same as before the injection. Injection may have helped a week. Pain comes and goes

## 2022-10-10 NOTE — Progress Notes (Signed)
Megan [name] - 87 y.o. female MRN [id number]  Date of birth: 10-05-1934  Office Visit Note: Visit Date: 10/10/2022 PCP: Georgianne Fick, MD Referred by: Georgianne Fick, MD  Subjective: Chief Complaint  Patient presents with   Lower Back - Pain   HPI: Megan [name] is a 88 y.o. female who comes in today for evaluation of chronic, worsening and severe bilateral lower back pain radiating to legs, ongoing for several years. States her pain increases with prolonged standing and walking, currently rates as 8 out of 10. Patient states pain becomes worse with walking and prolonged standing. She describes as numbness, sharp and sore sensation, currently rates as 9 out of 10. Patient reports some relief of pain with home exercise regimen, rest and use of medications. Has taken Tramadol intermittently for chronic lower back pain. Patient has attended formal physical therapy in the past at [facility] and reports some pain relief with these treatments. Recent lumbar MRI imaging exhibits severe multifactorial spinal stenosis at L3-L4 and L4-L5. Patient has undergone multiple lumbar epidural steroid injections with intermittent relief of pain. Most recent was bilateral L4 transforaminal epidural steroid injection performed on 09/19/2022. She reports short term relief with this injection, about 50% for 1 week. Patient ambulates with rolling walker, gait slow and unsteady. She denies focal weakness, numbness and tingling. No recent trauma or falls.    Review of Systems  Musculoskeletal:  Positive for back pain.  Neurological:  Negative for tingling, sensory change, focal weakness and weakness.  All other systems reviewed and are negative.  Otherwise per HPI.  Assessment & Plan: Visit Diagnoses:    ICD-10-CM   1. Chronic bilateral low back pain with bilateral sciatica  M54.42 Ambulatory referral to Physical Medicine Rehab   M54.41    G89.29     2. Lumbar  radiculopathy  M54.16 Ambulatory referral to Physical Medicine Rehab    3. Spinal stenosis of lumbar region with neurogenic claudication  M48.062 Ambulatory referral to Physical Medicine Rehab    4. Gait abnormality  R26.9 Ambulatory referral to Physical Medicine Rehab       Plan: Findings:  Chronic, worsening and severe bilateral lower back pain radiating to legs. Patient continues to have severe pain despite good conservative therapies such as formal physical therapy, home exercise regimen, rest and use of medications. Patients clinical presentation and exam are consistent with neurogenic claudication as a result of spinal canal stenosis. There is severe multifactorial spinal stenosis at L3-L4 and L4-L5. I discussed treatment plan in detail today, we would like to try diagnostic and hopefully therapeutic right L5-S1 interlaminar epidural steroid injection. We are hopeful this injection approach will give her lasting relief of pain. I also discussed medication management with her and started Cymbalta. I instructed her to take 1 tablet (30 mg) once a day for 2 weeks, if tolerating well can then titrate up to twice daily. I informed her this medication can cause short term gastrointestinal side effects. She did mention surgery during our visit, I am not sure she would be ideal surgical candidate due to her age and co-morbidities. Her surgery would likely require lumbar fusion due to degenerative anterolisthesis. I am happy to arrange for her to consult with our spine surgeon Dr. Willia Craze if she would like to discuss options. She would like to proceed with injection at this time. No red flag symptoms noted upon exam today.     Meds & Orders:  Meds ordered this encounter  Medications   DULoxetine (CYMBALTA) 30 MG capsule    Sig: Take 1 capsule (30 mg total) once a day by mouth for 2 weeks, then take 1 capsule (30 mg) twice a day.    Dispense:  60 capsule    Refill:  3    Orders Placed This  Encounter  Procedures   Ambulatory referral to Physical Medicine Rehab    Follow-up: Return for Right L5-S1 interlaminar epidural steroid injection.   Procedures: No procedures performed      Clinical History: CLINICAL DATA:  Low back pain. Symptoms persist with greater than 6 weeks of treatment. Low back and bilateral leg pain.   EXAM: MRI LUMBAR SPINE WITHOUT CONTRAST   TECHNIQUE: Multiplanar, multisequence MR imaging of the lumbar spine was performed. No intravenous contrast was administered.   COMPARISON:  09/30/2020   FINDINGS: Segmentation: Five lumbar type vertebral bodies as numbered previously.   Alignment: 6-7 mm degenerative anterolisthesis at the L4-5 level. 3-4 mm degenerative anterolisthesis at the L5-S1 level.   Vertebrae: Mild superior endplate depression at L4 to the right of midline consistent with a minor compression injury. No other focal bone finding.   Conus medullaris and cauda equina: Conus extends to the L1-2 level. Conus and cauda equina appear normal.   Paraspinal and other soft tissues: Negative   Disc levels:   No significant finding from T11-12 through L2-3: Mild noncompressive disc bulges.   L3-4: Severe multifactorial spinal stenosis. Shallow protrusion of the disc. Pronounced facet and ligamentous hypertrophy. Similar appearance or slight worsening compared to the study of 2022.   L4-5: Bilateral facet arthropathy with 6-7 mm of degenerative anterolisthesis. Bulging of the disc. Severe multifactorial spinal stenosis, similar to the study of 2022.   L5-S1: Bilateral facet degeneration with 3-4 mm of degenerative anterolisthesis. Mild bulging of the disc. No central canal stenosis. Stenosis of the subarticular lateral recesses could plates the S1 nerves at risk. The L5 nerves appear to exit the foramen without compression. Similar to the study of 2022.   IMPRESSION: 1. Severe multifactorial spinal stenosis at L3-4 and L4-5,  similar to the study of 2022. 2. Mild superior endplate fracture at L4 to the right of midline consistent with a minor compression injury. This could certainly relate to low back pain. 3. L5-S1 facet arthropathy with 3-4 mm of anterolisthesis. Mild bulging of the disc. Narrowing of the subarticular lateral recesses that could possibly affect the S1 nerves. Similar to the study of 2022.     Electronically Signed   By: Paulina Fusi M.D.   On: 08/19/2022 14:14   She reports that she has never smoked. She has never used smokeless tobacco. No results for input(s): "HGBA1C", "LABURIC" in the last 8760 hours.  Objective:  VS:  HT:    WT:   BMI:     BP:   HR: bpm  TEMP: ( )  RESP:  Physical Exam Vitals and nursing note reviewed.  HENT:     Head: Normocephalic and atraumatic.     Right Ear: External ear normal.     Left Ear: External ear normal.     Nose: Nose normal.     Mouth/Throat:     Mouth: Mucous membranes are moist.  Eyes:     Extraocular Movements: Extraocular movements intact.  Cardiovascular:     Rate and Rhythm: Normal rate.     Pulses: Normal pulses.  Pulmonary:     Effort: Pulmonary effort is normal.  Abdominal:  General: Abdomen is flat. There is no distension.  Musculoskeletal:        General: Tenderness present.     Cervical back: Normal range of motion.     Comments: Patient is slow to rise from seated position to standing. Good lumbar range of motion. No pain noted with facet loading. 5/5 strength noted with bilateral hip flexion, knee flexion/extension, ankle dorsiflexion/plantarflexion and EHL. No clonus noted bilaterally. No pain upon palpation of greater trochanters. No pain with internal/external rotation of bilateral hips. Sensation intact bilaterally. Negative slump test bilaterally. Ambulates with rolling walker, gait slow and unsteady.  Skin:    General: Skin is warm and dry.     Capillary Refill: Capillary refill takes less than 2 seconds.   Neurological:     General: No focal deficit present.     Mental Status: She is alert and oriented to person, place, and time.  Psychiatric:        Mood and Affect: Mood normal.        Behavior: Behavior normal.     Ortho Exam  Imaging: No results found.  Past Medical/Family/Surgical/Social History: Medications & Allergies reviewed per EMR, new medications updated. Patient Active Problem List   Diagnosis Date Noted   Hypertensive retinopathy 09/13/2020   Gastroesophageal reflux disease 09/13/2020   Dyspnea on exertion 03/23/2018   Other secondary pulmonary hypertension (HCC) 03/23/2018   S/P TAVR (transcatheter aortic valve replacement) 03/03/2018   Diabetes mellitus without complication (HCC)    Hypertension    Hypothyroidism    Anterior ischemic optic neuropathy of right eye 05/28/2017   Snoring 05/28/2017   OSA (obstructive sleep apnea) 05/28/2017   Shoulder arthritis 10/29/2016   Severe aortic stenosis 06/21/2016   Lightheaded 06/05/2016   Poor balance 06/05/2016   Sensorineural hearing loss (SNHL), bilateral 02/02/2016   Arthritis of shoulder region, left, degenerative 07/14/2014   Past Medical History:  Diagnosis Date   Anemia    Arthritis    Cancer (HCC) 1989   melanoma   Diabetes mellitus without complication (HCC)    Dizziness    GERD (gastroesophageal reflux disease)    History of hiatal hernia    Hypertension    Hypothyroidism    IBS (irritable bowel syndrome)    S/P TAVR (transcatheter aortic valve replacement) 03/03/2018   s/p 23 mm Edwards Sapien THV via the TF approach   Severe aortic stenosis    Family History  Problem Relation Age of Onset   Hypertension Mother    Hypertension Father    Heart disease Father    Heart disease Brother    Past Surgical History:  Procedure Laterality Date   APPENDECTOMY     88 yrs old   CARPAL TUNNEL RELEASE Right 10/1999   Myerdierks, MD   CHOLECYSTECTOMY  89   COLONOSCOPY     EYE SURGERY Bilateral 2017    cataract surgery with lens implants   JOINT REPLACEMENT Right    knee    REVERSE SHOULDER ARTHROPLASTY Left 07/14/2014   REVERSE SHOULDER ARTHROPLASTY Left 07/14/2014   Procedure: LEFT REVERSE SHOULDER ARTHROPLASTY;  Surgeon: Cammy Copa, MD;  Location: [facility] OR;  Service: Orthopedics;  Laterality: Left;   RIGHT/LEFT HEART CATH AND CORONARY ANGIOGRAPHY N/A 01/29/2018   Procedure: RIGHT/LEFT HEART CATH AND CORONARY ANGIOGRAPHY;  Surgeon: Lyn Records, MD;  Location: [facility] INVASIVE CV LAB;  Service: Cardiovascular;  Laterality: N/A;   SHOULDER ARTHROSCOPY Left    TEE WITHOUT CARDIOVERSION N/A 03/03/2018   Procedure: TRANSESOPHAGEAL ECHOCARDIOGRAM (  TEE);  Surgeon: Tonny Bollman, MD;  Location: [facility] INVASIVE CV LAB;  Service: Open Heart Surgery;  Laterality: N/A;   TOTAL SHOULDER REVISION Right 10/29/2016   Procedure: RIGHT REVERSE TOTAL SHOULDER;  Surgeon: Cammy Copa, MD;  Location: [facility] OR;  Service: Orthopedics;  Laterality: Right;   TRANSCATHETER AORTIC VALVE REPLACEMENT, TRANSFEMORAL N/A 03/03/2018   Procedure: TRANSCATHETER AORTIC VALVE REPLACEMENT, TRANSFEMORAL;  Surgeon: Tonny Bollman, MD;  Location: [facility] INVASIVE CV LAB;  Service: Open Heart Surgery;  Laterality: N/A;   Social History   Occupational History   Not on file  Tobacco Use   Smoking status: Never   Smokeless tobacco: Never  Vaping Use   Vaping status: Never Used  Substance and Sexual Activity   Alcohol use: No   Drug use: No   Sexual activity: Not on file

## 2022-10-28 ENCOUNTER — Ambulatory Visit: Payer: Medicare Other | Admitting: Physical Medicine and Rehabilitation

## 2022-10-28 ENCOUNTER — Other Ambulatory Visit: Payer: Self-pay

## 2022-10-28 VITALS — BP 134/70 | HR 94

## 2022-10-28 DIAGNOSIS — M5416 Radiculopathy, lumbar region: Secondary | ICD-10-CM

## 2022-10-28 MED ORDER — METHYLPREDNISOLONE ACETATE 80 MG/ML IJ SUSP
80.0000 mg | Freq: Once | INTRAMUSCULAR | Status: AC
  Administered 2022-10-28: 80 mg

## 2022-10-28 NOTE — Progress Notes (Signed)
Functional Pain Scale - descriptive words and definitions  Distracting (5)    Aware of pain/able to complete some ADL's but limited by pain/sleep is affected and active distractions are only slightly useful. Moderate range order  Average Pain  varies   +Driver, -BT, -Dye Allergies.  Lower back pain on left side. Pain in both legs. Standing and walking makes pain worse

## 2022-10-28 NOTE — Progress Notes (Signed)
Megan [name] - 88 y.o. female MRN [id number]  Date of birth: 1934/11/23  Office Visit Note: Visit Date: 10/28/2022 PCP: Georgianne Fick, MD Referred by: Georgianne Fick, MD  Subjective: Chief Complaint  Patient presents with   Lower Back - Pain   HPI:  Megan [name] is a 88 y.o. female who comes in today at the request of Ellin Goodie, FNP for planned Left L5-S1 Lumbar Interlaminar epidural steroid injection with fluoroscopic guidance.  The patient has failed conservative care including home exercise, medications, time and activity modification.  This injection will be diagnostic and hopefully therapeutic.  Please see requesting physician notes for further details and justification. Taking duloxetine 30mg  x1 daily without side effects.   ROS Otherwise per HPI.  Assessment & Plan: Visit Diagnoses:    ICD-10-CM   1. Lumbar radiculopathy  M54.16 XR C-ARM NO REPORT    methylPREDNISolone acetate (DEPO-MEDROL) injection 80 mg    Epidural Steroid injection      Plan: No additional findings.   Meds & Orders:  Meds ordered this encounter  Medications   methylPREDNISolone acetate (DEPO-MEDROL) injection 80 mg    Orders Placed This Encounter  Procedures   XR C-ARM NO REPORT   Epidural Steroid injection    Follow-up: Return for visit to requesting provider as needed.   Procedures: No procedures performed  Lumbar Epidural Steroid Injection - Interlaminar Approach with Fluoroscopic Guidance  [name]      Date of Birth: 04/13/1934 MRN: [id number] PCP: Georgianne Fick, MD      Visit Date: 10/28/2022   Universal Protocol:     Consent Given By: the patient  Position: PRONE  Additional Comments: Vital signs were monitored before and after the procedure. Patient was prepped and draped in the usual sterile fashion. The correct patient, procedure, and site was verified.   Injection Procedure Details:   Procedure diagnoses: Lumbar radiculopathy  [M54.16]   Meds Administered:  Meds ordered this encounter  Medications   methylPREDNISolone acetate (DEPO-MEDROL) injection 80 mg     Laterality: Left  Location/Site:  L5-S1  Needle: 3.5 in., 20 ga. Tuohy  Needle Placement: Paramedian epidural  Findings:   -Comments: Excellent flow of contrast into the epidural space.  Procedure Details: Using a paramedian approach from the side mentioned above, the region overlying the inferior lamina was localized under fluoroscopic visualization and the soft tissues overlying this structure were infiltrated with 4 ml. of 1% Lidocaine without Epinephrine. The Tuohy needle was inserted into the epidural space using a paramedian approach.   The epidural space was localized using loss of resistance along with counter oblique bi-planar fluoroscopic views.  After negative aspirate for air, blood, and CSF, a 2 ml. volume of Isovue-250 was injected into the epidural space and the flow of contrast was observed. Radiographs were obtained for documentation purposes.    The injectate was administered into the level noted above.   Additional Comments:  The patient tolerated the procedure well Dressing: 2 x 2 sterile gauze and Band-Aid    Post-procedure details: Patient was observed during the procedure. Post-procedure instructions were reviewed.  Patient left the clinic in stable condition.   Clinical History: CLINICAL DATA:  Low back pain. Symptoms persist with greater than 6 weeks of treatment. Low back and bilateral leg pain.   EXAM: MRI LUMBAR SPINE WITHOUT CONTRAST   TECHNIQUE: Multiplanar, multisequence MR imaging of the lumbar spine was performed. No intravenous contrast was administered.   COMPARISON:  09/30/2020  FINDINGS: Segmentation: Five lumbar type vertebral bodies as numbered previously.   Alignment: 6-7 mm degenerative anterolisthesis at the L4-5 level. 3-4 mm degenerative anterolisthesis at the L5-S1 level.    Vertebrae: Mild superior endplate depression at L4 to the right of midline consistent with a minor compression injury. No other focal bone finding.   Conus medullaris and cauda equina: Conus extends to the L1-2 level. Conus and cauda equina appear normal.   Paraspinal and other soft tissues: Negative   Disc levels:   No significant finding from T11-12 through L2-3: Mild noncompressive disc bulges.   L3-4: Severe multifactorial spinal stenosis. Shallow protrusion of the disc. Pronounced facet and ligamentous hypertrophy. Similar appearance or slight worsening compared to the study of 2022.   L4-5: Bilateral facet arthropathy with 6-7 mm of degenerative anterolisthesis. Bulging of the disc. Severe multifactorial spinal stenosis, similar to the study of 2022.   L5-S1: Bilateral facet degeneration with 3-4 mm of degenerative anterolisthesis. Mild bulging of the disc. No central canal stenosis. Stenosis of the subarticular lateral recesses could plates the S1 nerves at risk. The L5 nerves appear to exit the foramen without compression. Similar to the study of 2022.   IMPRESSION: 1. Severe multifactorial spinal stenosis at L3-4 and L4-5, similar to the study of 2022. 2. Mild superior endplate fracture at L4 to the right of midline consistent with a minor compression injury. This could certainly relate to low back pain. 3. L5-S1 facet arthropathy with 3-4 mm of anterolisthesis. Mild bulging of the disc. Narrowing of the subarticular lateral recesses that could possibly affect the S1 nerves. Similar to the study of 2022.     Electronically Signed   By: Paulina Fusi M.D.   On: 08/19/2022 14:14     Objective:  VS:  HT:    WT:   BMI:     BP:134/70  HR:94bpm  TEMP: ( )  RESP:  Physical Exam Vitals and nursing note reviewed.  Constitutional:      General: She is not in acute distress.    Appearance: Normal appearance. She is not ill-appearing.  HENT:     Head:  Normocephalic and atraumatic.     Right Ear: External ear normal.     Left Ear: External ear normal.  Eyes:     Extraocular Movements: Extraocular movements intact.  Cardiovascular:     Rate and Rhythm: Normal rate.     Pulses: Normal pulses.  Pulmonary:     Effort: Pulmonary effort is normal. No respiratory distress.  Abdominal:     General: There is no distension.     Palpations: Abdomen is soft.  Musculoskeletal:        General: Tenderness present.     Cervical back: Neck supple.     Right lower leg: No edema.     Left lower leg: No edema.     Comments: Patient has good distal strength with no pain over the greater trochanters.  No clonus or focal weakness. Ambulate with walker.  Skin:    Findings: No erythema, lesion or rash.  Neurological:     General: No focal deficit present.     Mental Status: She is alert and oriented to person, place, and time.     Cranial Nerves: No cranial nerve deficit.     Sensory: No sensory deficit.     Motor: No weakness or abnormal muscle tone.     Coordination: Coordination normal.     Gait: Gait abnormal.  Psychiatric:  Mood and Affect: Mood normal.        Behavior: Behavior normal.      Imaging: No results found.

## 2022-10-28 NOTE — Patient Instructions (Signed)

## 2022-10-28 NOTE — Procedures (Signed)
Lumbar Epidural Steroid Injection - Interlaminar Approach with Fluoroscopic Guidance  [name]      Date of Birth: Sep 26, 1934 MRN: [id number] PCP: Georgianne Fick, MD      Visit Date: 10/28/2022   Universal Protocol:     Consent Given By: the patient  Position: PRONE  Additional Comments: Vital signs were monitored before and after the procedure. Patient was prepped and draped in the usual sterile fashion. The correct patient, procedure, and site was verified.   Injection Procedure Details:   Procedure diagnoses: Lumbar radiculopathy [M54.16]   Meds Administered:  Meds ordered this encounter  Medications   methylPREDNISolone acetate (DEPO-MEDROL) injection 80 mg     Laterality: Left  Location/Site:  L5-S1  Needle: 3.5 in., 20 ga. Tuohy  Needle Placement: Paramedian epidural  Findings:   -Comments: Excellent flow of contrast into the epidural space.  Procedure Details: Using a paramedian approach from the side mentioned above, the region overlying the inferior lamina was localized under fluoroscopic visualization and the soft tissues overlying this structure were infiltrated with 4 ml. of 1% Lidocaine without Epinephrine. The Tuohy needle was inserted into the epidural space using a paramedian approach.   The epidural space was localized using loss of resistance along with counter oblique bi-planar fluoroscopic views.  After negative aspirate for air, blood, and CSF, a 2 ml. volume of Isovue-250 was injected into the epidural space and the flow of contrast was observed. Radiographs were obtained for documentation purposes.    The injectate was administered into the level noted above.   Additional Comments:  The patient tolerated the procedure well Dressing: 2 x 2 sterile gauze and Band-Aid    Post-procedure details: Patient was observed during the procedure. Post-procedure instructions were reviewed.  Patient left the clinic in stable  condition.

## 2022-10-29 DIAGNOSIS — L570 Actinic keratosis: Secondary | ICD-10-CM | POA: Diagnosis not present

## 2022-11-01 ENCOUNTER — Other Ambulatory Visit: Payer: Self-pay | Admitting: Physical Medicine and Rehabilitation

## 2022-11-19 ENCOUNTER — Telehealth: Payer: Self-pay

## 2022-11-19 NOTE — Telephone Encounter (Signed)
FYI- Patient called in and stated the injection and medication helped a lot

## 2022-12-18 ENCOUNTER — Other Ambulatory Visit: Payer: Self-pay | Admitting: Internal Medicine

## 2022-12-18 DIAGNOSIS — Z1231 Encounter for screening mammogram for malignant neoplasm of breast: Secondary | ICD-10-CM

## 2022-12-31 ENCOUNTER — Telehealth: Payer: Self-pay | Admitting: Cardiovascular Disease

## 2022-12-31 DIAGNOSIS — E1165 Type 2 diabetes mellitus with hyperglycemia: Secondary | ICD-10-CM | POA: Diagnosis not present

## 2022-12-31 NOTE — Telephone Encounter (Signed)
Patient has dropped off an application for accessible parking.  I am placing this application in Dr. Fabio Bering mail box.  The patient will return to pick up this application on -01/16/2023 - this is her next appointment with Dr. Eden Emms.  Thank you.

## 2023-01-01 NOTE — Telephone Encounter (Signed)
Patient told front desk clerk that she would retrieve the form at her office visit.

## 2023-01-07 DIAGNOSIS — I10 Essential (primary) hypertension: Secondary | ICD-10-CM | POA: Diagnosis not present

## 2023-01-07 DIAGNOSIS — E1165 Type 2 diabetes mellitus with hyperglycemia: Secondary | ICD-10-CM | POA: Diagnosis not present

## 2023-01-07 DIAGNOSIS — E782 Mixed hyperlipidemia: Secondary | ICD-10-CM | POA: Diagnosis not present

## 2023-01-07 DIAGNOSIS — N1831 Chronic kidney disease, stage 3a: Secondary | ICD-10-CM | POA: Diagnosis not present

## 2023-01-07 DIAGNOSIS — E039 Hypothyroidism, unspecified: Secondary | ICD-10-CM | POA: Diagnosis not present

## 2023-01-07 DIAGNOSIS — E113293 Type 2 diabetes mellitus with mild nonproliferative diabetic retinopathy without macular edema, bilateral: Secondary | ICD-10-CM | POA: Diagnosis not present

## 2023-01-07 NOTE — Telephone Encounter (Signed)
Form has been signed. Will give to patient at her appt next week.

## 2023-01-08 NOTE — Progress Notes (Signed)
Date:  01/16/2023   ID:  Megan [name], DOB [birth year], MRN [id number]  Provider Location: Office  PCP:  Georgianne Fick, MD  Cardiologist:   Eden Emms Electrophysiologist:  None   Evaluation Performed:  Follow-Up Visit  Chief Complaint:  AS/TAVR  History of Present Illness:    88 y.o. with severe AS Successful TAVR with 23 mm Sapien 3 TF approach 03/03/18. Post op echo with mean gradient elevated 19 mmHg No PVL Has seen Dr Delton Coombes for pulmonary HTN He hopes this improves with Rx of her left sided valve disease Cath done 01/29/18 prior to TAVR no CAD. Measured PA pressure on right heart cath only 33/12 with mean 22 mmHg. Mean PCWP 10 mmHg No CAD  No complaints Husband passed 8 years ago had been married 85 years Son lives with her and helps out Activity limited by arthritis in knees and back   Post TAVR echo 09/30/19 MS mean gradient 11 MVA 2 cm2 MAC with mild MR Not surgical candidate stable TAVR valve no AR mean gradient 15 mmHg peak 25 mmHg DVI 0.43 AVA 1.3 cm2   ECG with chronic RBBB  Monitor 04/09/19 with average HR 77 bpm <1% PAC/PVC''s   Has had both shoulders operated on and follows with Dr August Saucer Also getting left hip injections And has some broken hardware in left hip from fall ? Need for surgical revision   Long discussion about baroreceptor vagal responses as she feels this may contribute to her dizziness. Discussed that PPM wouldn't help this and offered another monitor but not clear to me that her dizzy spells are related and ECG stable with no evidence of higher grade block  TTE 04/09/21 reviewed EF> 75% Normal RV Mild MR moderate MS from MAC mean gradient 11 mmHg at average HR of 82 bpm  Doing well Activity limited by arthritis  September had prednisone injection L5-S1 for radiculopathy Discussed changing [place] to Diovan and beta blocker She is doing well with no dyspnea, palpitations and prefers to wait on any changes   Son lives with her and has had CABG Bad cough  getting w/u for lungs He is a smoker  She is dealing with her arthritis with Dr Alvester Morin    Past Medical History:  Diagnosis Date   Anemia    Arthritis    Cancer (HCC) 1989   melanoma   Diabetes mellitus without complication (HCC)    Dizziness    GERD (gastroesophageal reflux disease)    History of hiatal hernia    Hypertension    Hypothyroidism    IBS (irritable bowel syndrome)    S/P TAVR (transcatheter aortic valve replacement) 03/03/2018   s/p 23 mm Edwards Sapien THV via the TF approach   Severe aortic stenosis    Past Surgical History:  Procedure Laterality Date   APPENDECTOMY     88 yrs old   CARPAL TUNNEL RELEASE Right 10/1999   Myerdierks, MD   CHOLECYSTECTOMY  89   COLONOSCOPY     EYE SURGERY Bilateral 2017   cataract surgery with lens implants   JOINT REPLACEMENT Right    knee    REVERSE SHOULDER ARTHROPLASTY Left 07/14/2014   REVERSE SHOULDER ARTHROPLASTY Left 07/14/2014   Procedure: LEFT REVERSE SHOULDER ARTHROPLASTY;  Surgeon: Cammy Copa, MD;  Location: [facility] OR;  Service: Orthopedics;  Laterality: Left;   RIGHT/LEFT HEART CATH AND CORONARY ANGIOGRAPHY N/A 01/29/2018   Procedure: RIGHT/LEFT HEART CATH AND CORONARY ANGIOGRAPHY;  Surgeon: Lyn Records,  MD;  Location: [facility] INVASIVE CV LAB;  Service: Cardiovascular;  Laterality: N/A;   SHOULDER ARTHROSCOPY Left    TEE WITHOUT CARDIOVERSION N/A 03/03/2018   Procedure: TRANSESOPHAGEAL ECHOCARDIOGRAM (TEE);  Surgeon: Tonny Bollman, MD;  Location: [facility] INVASIVE CV LAB;  Service: Open Heart Surgery;  Laterality: N/A;   TOTAL SHOULDER REVISION Right 10/29/2016   Procedure: RIGHT REVERSE TOTAL SHOULDER;  Surgeon: Cammy Copa, MD;  Location: [facility] OR;  Service: Orthopedics;  Laterality: Right;   TRANSCATHETER AORTIC VALVE REPLACEMENT, TRANSFEMORAL N/A 03/03/2018   Procedure: TRANSCATHETER AORTIC VALVE REPLACEMENT, TRANSFEMORAL;  Surgeon: Tonny Bollman, MD;  Location: [facility] INVASIVE CV LAB;  Service: Open Heart Surgery;   Laterality: N/A;     Current Meds  Medication Sig   ACCU-CHEK AVIVA PLUS test strip 1 each by Other route 2 (two) times daily.    ACCU-CHEK SOFTCLIX LANCETS lancets 1 each by Other route 2 (two) times daily.    acetaminophen (TYLENOL) 500 MG tablet Take 500-1,000 mg by mouth every 6 (six) hours as needed (pain.).   amLODipine-valsartan (EXFORGE) 5-160 MG tablet Take 1 tablet by mouth daily.   aspirin 81 MG chewable tablet Chew 1 tablet (81 mg total) by mouth daily.   calcium-vitamin D (OSCAL WITH D) 500-200 MG-UNIT tablet Take 1 tablet by mouth daily at 12 noon.    DULoxetine (CYMBALTA) 30 MG capsule TAKE 1 CAPSULE (30 MG TOTAL) ONCE A DAY BY MOUTH FOR 2 WEEKS, THEN TAKE 1 CAPSULE (30 MG) TWICE A DAY.   econazole nitrate 1 % cream Apply 1 application topically daily. For nose/corners of mouth   Ergocalciferol (VITAMIN D2 PO) Take 2,000 mg by mouth.   gabapentin (NEURONTIN) 100 MG capsule TAKE 1 CAPSULE BY MOUTH THREE TIMES A DAY   glimepiride (AMARYL) 2 MG tablet Take 2 mg by mouth See admin instructions. Take 2 mg by mouth in the morning and 4 mg by mouth at night.   hydrocortisone 2.5 % cream Apply 1 application topically daily. Applied to nose/corners of mouth   methocarbamol (ROBAXIN) 500 MG tablet Take 1 tablet (500 mg total) by mouth 3 (three) times daily.   Multiple Vitamin (MULTIVITAMIN WITH MINERALS) TABS tablet Take 1 tablet by mouth daily at 12 noon.    MYRBETRIQ 50 MG TB24 tablet Take 50 mg by mouth daily.   Omega-3 Fatty Acids (FISH OIL) 1200 MG CAPS Take 1,200 mg by mouth daily at 12 noon.    rosuvastatin (CRESTOR) 5 MG tablet Take 5 mg by mouth daily.   sitaGLIPtin (JANUVIA) 100 MG tablet Take 100 mg by mouth daily.   SYNTHROID 125 MCG tablet Take 125 mcg by mouth daily before breakfast. Take 5 days a week   traMADol (ULTRAM) 50 MG tablet Take 1 tablet (50 mg total) by mouth every 8 (eight) hours as needed.   triamterene-hydrochlorothiazide (DYAZIDE) 37.5-25 MG per capsule  Take 1 capsule by mouth at bedtime.     Allergies:   Patient has no known allergies.   Social History   Tobacco Use   Smoking status: Never   Smokeless tobacco: Never  Vaping Use   Vaping status: Never Used  Substance Use Topics   Alcohol use: No   Drug use: No     Family Hx: The patient's family history includes Heart disease in her brother and father; Hypertension in her father and mother.  ROS:   Please see the history of present illness.     All other systems reviewed and are negative.  Prior CV studies:   The following studies were reviewed today:  Echo 04/09/18 EF >65% trivial PVL mean gradient 16 mmHg peak 33 mmHg Echo 03/10/19 see HPI Monitor  04/21/19   Labs/Other Tests and Data Reviewed:    EKG:  SR rate 73 RBBB  01/16/2023   Recent Labs: No results found for requested labs within last 365 days.   Recent Lipid Panel No results found for: "CHOL", "TRIG", "HDL", "CHOLHDL", "LDLCALC", "LDLDIRECT"  Wt Readings from Last 3 Encounters:  01/16/23 186 lb (84.4 kg)  04/25/22 196 lb (88.9 kg)  03/26/21 191 lb (86.6 kg)     Objective:    Vital Signs:  BP 136/72   Pulse 86   Ht 5\' 4"  (1.626 m)   Wt 186 lb (84.4 kg)   LMP  (LMP Unknown)   SpO2 95%   BMI 31.93 kg/m    Affect appropriate Healthy:  appears stated age HEENT: normal Neck supple with no adenopathy JVP normal no bruits no thyromegaly Lungs clear with no wheezing and good diaphragmatic motion Heart:  S1/S2 SEM no AR  murmur, no rub, gallop or click PMI normal Abdomen: benighn, BS positve, no tenderness, no AAA no bruit.  No HSM or HJR Distal pulses intact with no bruits No edema Neuro non-focal Skin warm and dry No muscular weakness   ASSESSMENT & PLAN:    AS/TAVR:  03/01/18 23 mm Edwards Sapien 3 DAT SBE prophylaxis. Stable by TTE 04/09/21  MS:  MAC with mean gradient 11 mmHg at HR 75 bpm  MVA 2 cm2 mild MR Not surgical candidate Discussed changing calcium blocker to beta blocker to  lower HR  And increase diastolic filling time She is doing well and asymptomatic and prefers to save this  Change for latter if she has new symptoms   HTN:  Well controlled.  Continue current medications and low sodium Dash type diet.  Currently on Dyazide and exforge   Thyroid:  On replacement TSH normal   DM:  Discussed low carb diet.  Target hemoglobin A1c is 6.5 or less.  Continue current medications.  HLD:  On crestor f/u labs with primary   Otho:  Post fall with displaced left hip hardware f/u ortho post injections   Dizziness:  infrequent episodes RBBB no change in ECG and previous monitor with no AV block or arrhythmia ? Vagal/baroreceptor involvement would be hard to diagnose and PPM would n't help if vasodepressor response     COVID-19 Education: The signs and symptoms of COVID-19 were discussed with the patient and how to seek care for testing (follow up with PCP or arrange E-visit).  The importance of social distancing was discussed today.   Medication Adjustments/Labs and Tests Ordered: Current medicines are reviewed at length with the patient today.  Concerns regarding medicines are outlined above.   Tests Ordered:  None  Medication Changes:  None   Disposition:  Follow up with cardiology in  a year   Signed, Charlton Haws, MD  01/16/2023 8:24 AM    [facility]

## 2023-01-16 ENCOUNTER — Ambulatory Visit: Payer: Medicare Other | Attending: Cardiovascular Disease | Admitting: Cardiovascular Disease

## 2023-01-16 ENCOUNTER — Encounter: Payer: Self-pay | Admitting: Cardiovascular Disease

## 2023-01-16 VITALS — BP 136/72 | HR 86 | Ht 64.0 in | Wt 186.0 lb

## 2023-01-16 DIAGNOSIS — I059 Rheumatic mitral valve disease, unspecified: Secondary | ICD-10-CM | POA: Diagnosis not present

## 2023-01-16 DIAGNOSIS — Z952 Presence of prosthetic heart valve: Secondary | ICD-10-CM

## 2023-01-16 NOTE — Patient Instructions (Signed)
Medication Instructions:  No changes *If you need a refill on your cardiac medications before your next appointment, please call your pharmacy*   Lab Work: none If you have labs (blood work) drawn today and your tests are completely normal, you will receive your results only by: MyChart Message (if you have MyChart) OR A paper copy in the mail If you have any lab test that is abnormal or we need to change your treatment, we will call you to review the results.   Testing/Procedures: none   Follow-Up: At [facility], you and your health needs are our priority.  As part of our continuing mission to provide you with exceptional heart care, we have created designated Provider Care Teams.  These Care Teams include your primary Cardiologist (physician) and Advanced Practice Providers (APPs -  Physician Assistants and Nurse Practitioners) who all work together to provide you with the care you need, when you need it.  We recommend signing up for the patient portal called "MyChart".  Sign up information is provided on this After Visit Summary.  MyChart is used to connect with patients for Virtual Visits (Telemedicine).  Patients are able to view lab/test results, encounter notes, upcoming appointments, etc.  Non-urgent messages can be sent to your provider as well.   To learn more about what you can do with MyChart, go to [email or web address].    Your next appointment:   12 month(s)  Provider:   Charlton Haws, MD     Other Instructions

## 2023-02-03 ENCOUNTER — Ambulatory Visit: Payer: Medicare Other

## 2023-02-07 ENCOUNTER — Other Ambulatory Visit: Payer: Self-pay | Admitting: Physical Medicine and Rehabilitation

## 2023-02-18 ENCOUNTER — Ambulatory Visit: Payer: Medicare Other

## 2023-03-04 ENCOUNTER — Ambulatory Visit
Admission: RE | Admit: 2023-03-04 | Discharge: 2023-03-04 | Disposition: A | Discharge status: Home / Self Care | Payer: Medicare Other | Source: Ambulatory Visit | Attending: Internal Medicine | Admitting: Internal Medicine

## 2023-03-04 DIAGNOSIS — Z1231 Encounter for screening mammogram for malignant neoplasm of breast: Secondary | ICD-10-CM | POA: Diagnosis not present

## 2023-03-13 DIAGNOSIS — I2510 Atherosclerotic heart disease of native coronary artery without angina pectoris: Secondary | ICD-10-CM | POA: Diagnosis not present

## 2023-03-13 DIAGNOSIS — E039 Hypothyroidism, unspecified: Secondary | ICD-10-CM | POA: Diagnosis not present

## 2023-03-13 DIAGNOSIS — E113293 Type 2 diabetes mellitus with mild nonproliferative diabetic retinopathy without macular edema, bilateral: Secondary | ICD-10-CM | POA: Diagnosis not present

## 2023-03-13 DIAGNOSIS — E782 Mixed hyperlipidemia: Secondary | ICD-10-CM | POA: Diagnosis not present

## 2023-03-24 DIAGNOSIS — D692 Other nonthrombocytopenic purpura: Secondary | ICD-10-CM | POA: Diagnosis not present

## 2023-03-24 DIAGNOSIS — I2510 Atherosclerotic heart disease of native coronary artery without angina pectoris: Secondary | ICD-10-CM | POA: Diagnosis not present

## 2023-03-24 DIAGNOSIS — H35039 Hypertensive retinopathy, unspecified eye: Secondary | ICD-10-CM | POA: Diagnosis not present

## 2023-03-24 DIAGNOSIS — N1831 Chronic kidney disease, stage 3a: Secondary | ICD-10-CM | POA: Diagnosis not present

## 2023-03-24 DIAGNOSIS — E039 Hypothyroidism, unspecified: Secondary | ICD-10-CM | POA: Diagnosis not present

## 2023-03-24 DIAGNOSIS — E113293 Type 2 diabetes mellitus with mild nonproliferative diabetic retinopathy without macular edema, bilateral: Secondary | ICD-10-CM | POA: Diagnosis not present

## 2023-03-24 DIAGNOSIS — N3281 Overactive bladder: Secondary | ICD-10-CM | POA: Diagnosis not present

## 2023-03-24 DIAGNOSIS — I10 Essential (primary) hypertension: Secondary | ICD-10-CM | POA: Diagnosis not present

## 2023-03-24 DIAGNOSIS — E782 Mixed hyperlipidemia: Secondary | ICD-10-CM | POA: Diagnosis not present

## 2023-03-25 ENCOUNTER — Telehealth: Payer: Self-pay | Admitting: Physical Medicine and Rehabilitation

## 2023-03-25 NOTE — Telephone Encounter (Signed)
 Patient called. Would like to know if she could get an injection?

## 2023-03-26 DIAGNOSIS — D225 Melanocytic nevi of trunk: Secondary | ICD-10-CM | POA: Diagnosis not present

## 2023-03-26 DIAGNOSIS — Z872 Personal history of diseases of the skin and subcutaneous tissue: Secondary | ICD-10-CM | POA: Diagnosis not present

## 2023-03-26 DIAGNOSIS — L821 Other seborrheic keratosis: Secondary | ICD-10-CM | POA: Diagnosis not present

## 2023-03-26 DIAGNOSIS — L570 Actinic keratosis: Secondary | ICD-10-CM | POA: Diagnosis not present

## 2023-03-26 DIAGNOSIS — L814 Other melanin hyperpigmentation: Secondary | ICD-10-CM | POA: Diagnosis not present

## 2023-03-31 ENCOUNTER — Telehealth: Payer: Self-pay

## 2023-03-31 DIAGNOSIS — M5416 Radiculopathy, lumbar region: Secondary | ICD-10-CM

## 2023-03-31 NOTE — Addendum Note (Signed)
 Addended by: Ashok Norris on: 03/31/2023 11:08 AM   Modules accepted: Orders

## 2023-03-31 NOTE — Telephone Encounter (Signed)
 She has last injection on 10/28/22. It helped her for 6 months, and gave her 75% relief. She is having pain in LBP and both legs. She has had a couple of falls/ "black outs" due to her heart condition. Current pain scale is 4-5.  Call back on her cell #

## 2023-04-14 ENCOUNTER — Other Ambulatory Visit: Payer: Self-pay

## 2023-04-14 ENCOUNTER — Ambulatory Visit: Admitting: Physical Medicine and Rehabilitation

## 2023-04-14 VITALS — BP 148/76 | HR 105

## 2023-04-14 DIAGNOSIS — M48062 Spinal stenosis, lumbar region with neurogenic claudication: Secondary | ICD-10-CM | POA: Diagnosis not present

## 2023-04-14 DIAGNOSIS — M5416 Radiculopathy, lumbar region: Secondary | ICD-10-CM

## 2023-04-14 DIAGNOSIS — M2170 Unequal limb length (acquired), unspecified site: Secondary | ICD-10-CM

## 2023-04-14 MED ORDER — METHYLPREDNISOLONE ACETATE 40 MG/ML IJ SUSP
40.0000 mg | Freq: Once | INTRAMUSCULAR | Status: AC
Start: 2023-04-14 — End: 2023-04-14
  Administered 2023-04-14: 40 mg

## 2023-04-14 NOTE — Procedures (Signed)
 Lumbar Epidural Steroid Injection - Interlaminar Approach with Fluoroscopic Guidance  [name]      Date of Birth: [birth year] MRN: [id number] PCP: Georgianne Fick, MD      Visit Date: 04/14/2023   Universal Protocol:     Consent Given By: the patient  Position: PRONE  Additional Comments: Vital signs were monitored before and after the procedure. Patient was prepped and draped in the usual sterile fashion. The correct patient, procedure, and site was verified.   Injection Procedure Details:   Procedure diagnoses: Lumbar radiculopathy [M54.16]   Meds Administered:  Meds ordered this encounter  Medications   methylPREDNISolone acetate (DEPO-MEDROL) injection 40 mg     Laterality: Left  Location/Site:  L5-S1  Needle: 3.5 in., 20 ga. Tuohy  Needle Placement: Paramedian epidural  Findings:   -Comments: Excellent flow of contrast into the epidural space.  Procedure Details: Using a paramedian approach from the side mentioned above, the region overlying the inferior lamina was localized under fluoroscopic visualization and the soft tissues overlying this structure were infiltrated with 4 ml. of 1% Lidocaine without Epinephrine. The Tuohy needle was inserted into the epidural space using a paramedian approach.   The epidural space was localized using loss of resistance along with counter oblique bi-planar fluoroscopic views.  After negative aspirate for air, blood, and CSF, a 2 ml. volume of Isovue-250 was injected into the epidural space and the flow of contrast was observed. Radiographs were obtained for documentation purposes.    The injectate was administered into the level noted above.   Additional Comments:  The patient tolerated the procedure well Dressing: 2 x 2 sterile gauze and Band-Aid    Post-procedure details: Patient was observed during the procedure. Post-procedure instructions were reviewed.  Patient left the clinic in stable  condition.

## 2023-04-14 NOTE — Progress Notes (Signed)
 Pain Scale   Average Pain 5        +Driver, -BT, -Dye Allergies.

## 2023-04-14 NOTE — Patient Instructions (Signed)

## 2023-04-14 NOTE — Progress Notes (Signed)
 Megan [name] - 89 y.o. female MRN [id number]  Date of birth: 1934-02-24  Office Visit Note: Visit Date: 04/14/2023 PCP: Georgianne Fick, MD Referred by: Georgianne Fick, MD  Subjective: Chief Complaint  Patient presents with   Lower Back - Pain   HPI:  Megan [name] is a 89 y.o. female who comes in today for planned repeat Left L5-S1  Lumbar Interlaminar epidural steroid injection with fluoroscopic guidance.  The patient has failed conservative care including home exercise, medications, time and activity modification.  This injection will be diagnostic and hopefully therapeutic.  Please see requesting physician notes for further details and justification. Patient received more than 50% pain relief from prior injection.   Referring: Ellin Goodie, FNP   ROS Otherwise per HPI.  Assessment & Plan: Visit Diagnoses:    ICD-10-CM   1. Lumbar radiculopathy  M54.16 XR C-ARM NO REPORT    Epidural Steroid injection    methylPREDNISolone acetate (DEPO-MEDROL) injection 40 mg    2. Spinal stenosis of lumbar region with neurogenic claudication  M48.062 XR C-ARM NO REPORT    Epidural Steroid injection    methylPREDNISolone acetate (DEPO-MEDROL) injection 40 mg    3. Leg length discrepancy  M21.70 Ambulatory referral to Sports Medicine      Plan: No additional findings.   Meds & Orders:  Meds ordered this encounter  Medications   methylPREDNISolone acetate (DEPO-MEDROL) injection 40 mg    Orders Placed This Encounter  Procedures   XR C-ARM NO REPORT   Ambulatory referral to Sports Medicine   Epidural Steroid injection    Follow-up: Return if symptoms worsen or fail to improve.   Procedures: No procedures performed  Lumbar Epidural Steroid Injection - Interlaminar Approach with Fluoroscopic Guidance  [name]      Date of Birth: Mar 25, 1934 MRN: [id number] PCP: Georgianne Fick, MD      Visit Date: 04/14/2023   Universal Protocol:     Consent Given  By: the patient  Position: PRONE  Additional Comments: Vital signs were monitored before and after the procedure. Patient was prepped and draped in the usual sterile fashion. The correct patient, procedure, and site was verified.   Injection Procedure Details:   Procedure diagnoses: Lumbar radiculopathy [M54.16]   Meds Administered:  Meds ordered this encounter  Medications   methylPREDNISolone acetate (DEPO-MEDROL) injection 40 mg     Laterality: Left  Location/Site:  L5-S1  Needle: 3.5 in., 20 ga. Tuohy  Needle Placement: Paramedian epidural  Findings:   -Comments: Excellent flow of contrast into the epidural space.  Procedure Details: Using a paramedian approach from the side mentioned above, the region overlying the inferior lamina was localized under fluoroscopic visualization and the soft tissues overlying this structure were infiltrated with 4 ml. of 1% Lidocaine without Epinephrine. The Tuohy needle was inserted into the epidural space using a paramedian approach.   The epidural space was localized using loss of resistance along with counter oblique bi-planar fluoroscopic views.  After negative aspirate for air, blood, and CSF, a 2 ml. volume of Isovue-250 was injected into the epidural space and the flow of contrast was observed. Radiographs were obtained for documentation purposes.    The injectate was administered into the level noted above.   Additional Comments:  The patient tolerated the procedure well Dressing: 2 x 2 sterile gauze and Band-Aid    Post-procedure details: Patient was observed during the procedure. Post-procedure instructions were reviewed.  Patient left the clinic in stable condition.  Clinical History: CLINICAL DATA:  Low back pain. Symptoms persist with greater than 6 weeks of treatment. Low back and bilateral leg pain.   EXAM: MRI LUMBAR SPINE WITHOUT CONTRAST   TECHNIQUE: Multiplanar, multisequence MR imaging of the lumbar  spine was performed. No intravenous contrast was administered.   COMPARISON:  09/30/2020   FINDINGS: Segmentation: Five lumbar type vertebral bodies as numbered previously.   Alignment: 6-7 mm degenerative anterolisthesis at the L4-5 level. 3-4 mm degenerative anterolisthesis at the L5-S1 level.   Vertebrae: Mild superior endplate depression at L4 to the right of midline consistent with a minor compression injury. No other focal bone finding.   Conus medullaris and cauda equina: Conus extends to the L1-2 level. Conus and cauda equina appear normal.   Paraspinal and other soft tissues: Negative   Disc levels:   No significant finding from T11-12 through L2-3: Mild noncompressive disc bulges.   L3-4: Severe multifactorial spinal stenosis. Shallow protrusion of the disc. Pronounced facet and ligamentous hypertrophy. Similar appearance or slight worsening compared to the study of 2022.   L4-5: Bilateral facet arthropathy with 6-7 mm of degenerative anterolisthesis. Bulging of the disc. Severe multifactorial spinal stenosis, similar to the study of 2022.   L5-S1: Bilateral facet degeneration with 3-4 mm of degenerative anterolisthesis. Mild bulging of the disc. No central canal stenosis. Stenosis of the subarticular lateral recesses could plates the S1 nerves at risk. The L5 nerves appear to exit the foramen without compression. Similar to the study of 2022.   IMPRESSION: 1. Severe multifactorial spinal stenosis at L3-4 and L4-5, similar to the study of 2022. 2. Mild superior endplate fracture at L4 to the right of midline consistent with a minor compression injury. This could certainly relate to low back pain. 3. L5-S1 facet arthropathy with 3-4 mm of anterolisthesis. Mild bulging of the disc. Narrowing of the subarticular lateral recesses that could possibly affect the S1 nerves. Similar to the study of 2022.     Electronically Signed   By: Paulina Fusi M.D.   On:  08/19/2022 14:14     Objective:  VS:  HT:    WT:   BMI:     BP:(!) 148/76  HR:(!) 105bpm  TEMP: ( )  RESP:  Physical Exam Vitals and nursing note reviewed.  Constitutional:      General: She is not in acute distress.    Appearance: Normal appearance. She is not ill-appearing.  HENT:     Head: Normocephalic and atraumatic.     Right Ear: External ear normal.     Left Ear: External ear normal.  Eyes:     Extraocular Movements: Extraocular movements intact.  Cardiovascular:     Rate and Rhythm: Normal rate.     Pulses: Normal pulses.  Pulmonary:     Effort: Pulmonary effort is normal. No respiratory distress.  Abdominal:     General: There is no distension.     Palpations: Abdomen is soft.  Musculoskeletal:        General: Tenderness present.     Cervical back: Neck supple.     Right lower leg: No edema.     Left lower leg: No edema.     Comments: Patient has good distal strength with no pain over the greater trochanters.  No clonus or focal weakness.  Skin:    Findings: No erythema, lesion or rash.  Neurological:     General: No focal deficit present.     Mental Status: She is alert  and oriented to person, place, and time.     Sensory: No sensory deficit.     Motor: No weakness or abnormal muscle tone.     Coordination: Coordination normal.  Psychiatric:        Mood and Affect: Mood normal.        Behavior: Behavior normal.      Imaging: No results found.

## 2023-04-24 ENCOUNTER — Encounter: Payer: Self-pay | Admitting: Sports Medicine

## 2023-04-24 ENCOUNTER — Ambulatory Visit: Admitting: Sports Medicine

## 2023-04-24 DIAGNOSIS — M2170 Unequal limb length (acquired), unspecified site: Secondary | ICD-10-CM

## 2023-04-24 DIAGNOSIS — Z8781 Personal history of (healed) traumatic fracture: Secondary | ICD-10-CM | POA: Diagnosis not present

## 2023-04-24 DIAGNOSIS — M48062 Spinal stenosis, lumbar region with neurogenic claudication: Secondary | ICD-10-CM | POA: Diagnosis not present

## 2023-04-24 NOTE — Progress Notes (Signed)
 Megan [name] - 89 y.o. female MRN [id number]  Date of birth: 07-18-1934  Office Visit Note: Visit Date: 04/24/2023 PCP: Georgianne Fick, MD Referred by: Tyrell Antonio, MD  Subjective: Chief Complaint  Patient presents with   Right Leg - Pain   Left Leg - Pain   HPI: Megan [name] is a pleasant 89 y.o. female who presents today for evaluation of leg length discrepancy in the setting of previous hip fracture status post pinning.   She does have a proximal intratrochanteric fracture status post intramedullary screw with IM nailing.  She states since this time, she feels like her legs have been different lengths and her gait has been affected.  About a year ago she did have a trochanteric bursa injection with Dr. August Saucer which was helpful.  She does walk some without any assistance but anytime she is out or on even ground she uses a walker.  Pertinent ROS were reviewed with the patient and found to be negative unless otherwise specified above in HPI.   Assessment & Plan: Visit Diagnoses:  1. Leg length discrepancy   2. S/p left hip fracture   3. Spinal stenosis of lumbar region with neurogenic claudication    Plan: Impression is leg length discrepancy which is near 3/4 inch, likely in the setting of her prior left hip fracture status post intertrochanteric pinning and IM nail.  She does have a mild Trendelenburg gait with some weakness of the gluteal musculature.  We did evaluate her gait both before and after heel lift which was 1/2 inch placed into the left shoe.  She found this to be comfortable and ambulated with a much more neutral pelvis through walking phase.  I discussed with her following this for a period of 2 weeks.  Feels comfortable in balance.  I did give her an extra 1 to place into a different shoe.  We discussed safe walking hips with the walker as well as without.  She may continue keeping the hips balance with routine activity.  She does take gabapentin as well as  Cymbalta for her neuropathy which she will continue at the discretion of Dr. Alvester Morin and medical providers.  I am happy to see her back as needed.  Follow-up: Return if symptoms worsen or fail to improve.   Meds & Orders: No orders of the defined types were placed in this encounter.  No orders of the defined types were placed in this encounter.    Procedures: No procedures performed      Clinical History: CLINICAL DATA:  Low back pain. Symptoms persist with greater than 6 weeks of treatment. Low back and bilateral leg pain.   EXAM: MRI LUMBAR SPINE WITHOUT CONTRAST   TECHNIQUE: Multiplanar, multisequence MR imaging of the lumbar spine was performed. No intravenous contrast was administered.   COMPARISON:  09/30/2020   FINDINGS: Segmentation: Five lumbar type vertebral bodies as numbered previously.   Alignment: 6-7 mm degenerative anterolisthesis at the L4-5 level. 3-4 mm degenerative anterolisthesis at the L5-S1 level.   Vertebrae: Mild superior endplate depression at L4 to the right of midline consistent with a minor compression injury. No other focal bone finding.   Conus medullaris and cauda equina: Conus extends to the L1-2 level. Conus and cauda equina appear normal.   Paraspinal and other soft tissues: Negative   Disc levels:   No significant finding from T11-12 through L2-3: Mild noncompressive disc bulges.   L3-4: Severe multifactorial spinal stenosis. Shallow protrusion of the disc.  Pronounced facet and ligamentous hypertrophy. Similar appearance or slight worsening compared to the study of 2022.   L4-5: Bilateral facet arthropathy with 6-7 mm of degenerative anterolisthesis. Bulging of the disc. Severe multifactorial spinal stenosis, similar to the study of 2022.   L5-S1: Bilateral facet degeneration with 3-4 mm of degenerative anterolisthesis. Mild bulging of the disc. No central canal stenosis. Stenosis of the subarticular lateral recesses could  plates the S1 nerves at risk. The L5 nerves appear to exit the foramen without compression. Similar to the study of 2022.   IMPRESSION: 1. Severe multifactorial spinal stenosis at L3-4 and L4-5, similar to the study of 2022. 2. Mild superior endplate fracture at L4 to the right of midline consistent with a minor compression injury. This could certainly relate to low back pain. 3. L5-S1 facet arthropathy with 3-4 mm of anterolisthesis. Mild bulging of the disc. Narrowing of the subarticular lateral recesses that could possibly affect the S1 nerves. Similar to the study of 2022.     Electronically Signed   By: Paulina Fusi M.D.   On: 08/19/2022 14:14  She reports that she has never smoked. She has never used smokeless tobacco. No results for input(s): "HGBA1C", "LABURIC" in the last 8760 hours.  Objective:   Vital Signs: LMP  (LMP Unknown)   Physical Exam  Gen: Well-appearing, in no acute distress; non-toxic CV: Well-perfused. Warm.  Resp: Breathing unlabored on room air; no wheezing. Psych: Fluid speech in conversation; appropriate affect; normal thought process  Ortho Exam - Pelvis/Legs/Gait analysis: There is no ASIS or greater trochanteric TTP.  The right hip moves rather fluidly with internal and external logroll, there is slightly limited motion with left hip logroll.  There is a leg length discrepancy with the right leg approximately 80 cm in the left leg at 78 cm.  The patient does walk with a slightly Trendelenburg gait with exaggerated outward leg swing on the right side to compensate for leg length discrepancy.  Imaging:  02/20/22: AP lateral radiographs lumbar spine reviewed.  Grade 1 spondylolisthesis  present at L4-5 and very slight spondylolisthesis present at L5-S1.  Mild  to moderate degenerative disc disease is present without compression  fractures.  Facet arthritis is mild in the lower lumbar region.   Past Medical/Family/Surgical/Social History: Medications  & Allergies reviewed per EMR, new medications updated. Patient Active Problem List   Diagnosis Date Noted   Hypertensive retinopathy 09/13/2020   Gastroesophageal reflux disease 09/13/2020   Dyspnea on exertion 03/23/2018   Other secondary pulmonary hypertension (HCC) 03/23/2018   S/P TAVR (transcatheter aortic valve replacement) 03/03/2018   Diabetes mellitus without complication (HCC)    Hypertension    Hypothyroidism    Anterior ischemic optic neuropathy of right eye 05/28/2017   Snoring 05/28/2017   OSA (obstructive sleep apnea) 05/28/2017   Shoulder arthritis 10/29/2016   Severe aortic stenosis 06/21/2016   Lightheaded 06/05/2016   Poor balance 06/05/2016   Sensorineural hearing loss (SNHL), bilateral 02/02/2016   Arthritis of shoulder region, left, degenerative 07/14/2014   Past Medical History:  Diagnosis Date   Anemia    Arthritis    Cancer (HCC) 1989   melanoma   Diabetes mellitus without complication (HCC)    Dizziness    GERD (gastroesophageal reflux disease)    History of hiatal hernia    Hypertension    Hypothyroidism    IBS (irritable bowel syndrome)    S/P TAVR (transcatheter aortic valve replacement) 03/03/2018   s/p 23 mm Edwards Sapien  THV via the TF approach   Severe aortic stenosis    Family History  Problem Relation Age of Onset   Hypertension Mother    Hypertension Father    Heart disease Father    Heart disease Brother    BRCA 1/2 Neg Hx    Breast cancer Neg Hx    Past Surgical History:  Procedure Laterality Date   APPENDECTOMY     89 yrs old   CARPAL TUNNEL RELEASE Right 10/1999   Myerdierks, MD   CHOLECYSTECTOMY  89   COLONOSCOPY     EYE SURGERY Bilateral 2017   cataract surgery with lens implants   JOINT REPLACEMENT Right    knee    REVERSE SHOULDER ARTHROPLASTY Left 07/14/2014   REVERSE SHOULDER ARTHROPLASTY Left 07/14/2014   Procedure: LEFT REVERSE SHOULDER ARTHROPLASTY;  Surgeon: Cammy Copa, MD;  Location: [facility] OR;   Service: Orthopedics;  Laterality: Left;   RIGHT/LEFT HEART CATH AND CORONARY ANGIOGRAPHY N/A 01/29/2018   Procedure: RIGHT/LEFT HEART CATH AND CORONARY ANGIOGRAPHY;  Surgeon: Lyn Records, MD;  Location: [facility] INVASIVE CV LAB;  Service: Cardiovascular;  Laterality: N/A;   SHOULDER ARTHROSCOPY Left    TEE WITHOUT CARDIOVERSION N/A 03/03/2018   Procedure: TRANSESOPHAGEAL ECHOCARDIOGRAM (TEE);  Surgeon: Tonny Bollman, MD;  Location: [facility] INVASIVE CV LAB;  Service: Open Heart Surgery;  Laterality: N/A;   TOTAL SHOULDER REVISION Right 10/29/2016   Procedure: RIGHT REVERSE TOTAL SHOULDER;  Surgeon: Cammy Copa, MD;  Location: [facility] OR;  Service: Orthopedics;  Laterality: Right;   TRANSCATHETER AORTIC VALVE REPLACEMENT, TRANSFEMORAL N/A 03/03/2018   Procedure: TRANSCATHETER AORTIC VALVE REPLACEMENT, TRANSFEMORAL;  Surgeon: Tonny Bollman, MD;  Location: [facility] INVASIVE CV LAB;  Service: Open Heart Surgery;  Laterality: N/A;   Social History   Occupational History   Not on file  Tobacco Use   Smoking status: Never   Smokeless tobacco: Never  Vaping Use   Vaping status: Never Used  Substance and Sexual Activity   Alcohol use: No   Drug use: No   Sexual activity: Not on file   I spent 35 minutes in the care of the patient today including face-to-face time, preparation to see the patient, as well as independent review of lumbar/pelvic x-rays, lumbar MRI, gait analysis both with and without heel lift, shoe modification and discussion on gait training and mobility aids for the above diagnoses.   Madelyn Brunner, DO Primary Care Sports Medicine Physician  [facility]  This note was dictated using Dragon naturally speaking software and may contain errors in syntax, spelling, or content which have not been identified prior to signing this note.

## 2023-04-24 NOTE — Progress Notes (Signed)
 Patient says that she fell and broke her hip a couple of weeks ago. She says that since then, she has been told that one leg is longer than the other; she notices this when walking. She says that she gets injections for her low back pain and arthritis, and has had an injection for left hip pain that has been helpful for the time since then. She takes Tylenol as needed which seems to help. She is also taking Cymbalta twice per day.

## 2023-04-29 DIAGNOSIS — H47011 Ischemic optic neuropathy, right eye: Secondary | ICD-10-CM | POA: Diagnosis not present

## 2023-04-29 DIAGNOSIS — E119 Type 2 diabetes mellitus without complications: Secondary | ICD-10-CM | POA: Diagnosis not present

## 2023-04-29 DIAGNOSIS — H35373 Puckering of macula, bilateral: Secondary | ICD-10-CM | POA: Diagnosis not present

## 2023-04-29 DIAGNOSIS — H524 Presbyopia: Secondary | ICD-10-CM | POA: Diagnosis not present

## 2023-04-29 DIAGNOSIS — H35363 Drusen (degenerative) of macula, bilateral: Secondary | ICD-10-CM | POA: Diagnosis not present

## 2023-05-07 ENCOUNTER — Other Ambulatory Visit: Payer: Self-pay | Admitting: Physical Medicine and Rehabilitation

## 2023-05-08 DIAGNOSIS — E1165 Type 2 diabetes mellitus with hyperglycemia: Secondary | ICD-10-CM | POA: Diagnosis not present

## 2023-05-15 DIAGNOSIS — E782 Mixed hyperlipidemia: Secondary | ICD-10-CM | POA: Diagnosis not present

## 2023-05-15 DIAGNOSIS — H35039 Hypertensive retinopathy, unspecified eye: Secondary | ICD-10-CM | POA: Diagnosis not present

## 2023-05-15 DIAGNOSIS — D692 Other nonthrombocytopenic purpura: Secondary | ICD-10-CM | POA: Diagnosis not present

## 2023-05-15 DIAGNOSIS — E039 Hypothyroidism, unspecified: Secondary | ICD-10-CM | POA: Diagnosis not present

## 2023-05-15 DIAGNOSIS — I10 Essential (primary) hypertension: Secondary | ICD-10-CM | POA: Diagnosis not present

## 2023-05-15 DIAGNOSIS — E1165 Type 2 diabetes mellitus with hyperglycemia: Secondary | ICD-10-CM | POA: Diagnosis not present

## 2023-05-15 DIAGNOSIS — E113293 Type 2 diabetes mellitus with mild nonproliferative diabetic retinopathy without macular edema, bilateral: Secondary | ICD-10-CM | POA: Diagnosis not present

## 2023-06-30 DIAGNOSIS — E113293 Type 2 diabetes mellitus with mild nonproliferative diabetic retinopathy without macular edema, bilateral: Secondary | ICD-10-CM | POA: Diagnosis not present

## 2023-06-30 DIAGNOSIS — H35039 Hypertensive retinopathy, unspecified eye: Secondary | ICD-10-CM | POA: Diagnosis not present

## 2023-06-30 DIAGNOSIS — D692 Other nonthrombocytopenic purpura: Secondary | ICD-10-CM | POA: Diagnosis not present

## 2023-06-30 DIAGNOSIS — E039 Hypothyroidism, unspecified: Secondary | ICD-10-CM | POA: Diagnosis not present

## 2023-06-30 DIAGNOSIS — N1831 Chronic kidney disease, stage 3a: Secondary | ICD-10-CM | POA: Diagnosis not present

## 2023-07-07 DIAGNOSIS — N3281 Overactive bladder: Secondary | ICD-10-CM | POA: Diagnosis not present

## 2023-07-07 DIAGNOSIS — E782 Mixed hyperlipidemia: Secondary | ICD-10-CM | POA: Diagnosis not present

## 2023-07-07 DIAGNOSIS — D692 Other nonthrombocytopenic purpura: Secondary | ICD-10-CM | POA: Diagnosis not present

## 2023-07-07 DIAGNOSIS — Z0000 Encounter for general adult medical examination without abnormal findings: Secondary | ICD-10-CM | POA: Diagnosis not present

## 2023-07-07 DIAGNOSIS — I10 Essential (primary) hypertension: Secondary | ICD-10-CM | POA: Diagnosis not present

## 2023-07-07 DIAGNOSIS — E113293 Type 2 diabetes mellitus with mild nonproliferative diabetic retinopathy without macular edema, bilateral: Secondary | ICD-10-CM | POA: Diagnosis not present

## 2023-07-07 DIAGNOSIS — H35039 Hypertensive retinopathy, unspecified eye: Secondary | ICD-10-CM | POA: Diagnosis not present

## 2023-07-07 DIAGNOSIS — E039 Hypothyroidism, unspecified: Secondary | ICD-10-CM | POA: Diagnosis not present

## 2023-07-07 DIAGNOSIS — N1831 Chronic kidney disease, stage 3a: Secondary | ICD-10-CM | POA: Diagnosis not present

## 2023-07-07 DIAGNOSIS — I2510 Atherosclerotic heart disease of native coronary artery without angina pectoris: Secondary | ICD-10-CM | POA: Diagnosis not present

## 2023-08-13 ENCOUNTER — Telehealth: Payer: Self-pay

## 2023-08-13 ENCOUNTER — Other Ambulatory Visit: Payer: Self-pay | Admitting: Physical Medicine and Rehabilitation

## 2023-08-13 DIAGNOSIS — M5416 Radiculopathy, lumbar region: Secondary | ICD-10-CM

## 2023-08-13 NOTE — Telephone Encounter (Signed)
 Last injection March 17,2025 % 80 of relief/function ability Duration or relief and improvement--3 months Current pain score---8 Recent falls or injuries--NONE Same location and same pain as last time

## 2023-08-14 DIAGNOSIS — I10 Essential (primary) hypertension: Secondary | ICD-10-CM | POA: Diagnosis not present

## 2023-08-14 DIAGNOSIS — E113293 Type 2 diabetes mellitus with mild nonproliferative diabetic retinopathy without macular edema, bilateral: Secondary | ICD-10-CM | POA: Diagnosis not present

## 2023-08-14 DIAGNOSIS — E782 Mixed hyperlipidemia: Secondary | ICD-10-CM | POA: Diagnosis not present

## 2023-08-14 DIAGNOSIS — E1165 Type 2 diabetes mellitus with hyperglycemia: Secondary | ICD-10-CM | POA: Diagnosis not present

## 2023-08-14 DIAGNOSIS — E039 Hypothyroidism, unspecified: Secondary | ICD-10-CM | POA: Diagnosis not present

## 2023-08-14 DIAGNOSIS — H35039 Hypertensive retinopathy, unspecified eye: Secondary | ICD-10-CM | POA: Diagnosis not present

## 2023-08-25 ENCOUNTER — Other Ambulatory Visit: Payer: Self-pay | Admitting: Physical Medicine and Rehabilitation

## 2023-08-27 ENCOUNTER — Ambulatory Visit: Admitting: Physical Medicine and Rehabilitation

## 2023-08-27 ENCOUNTER — Other Ambulatory Visit: Payer: Self-pay

## 2023-08-27 VITALS — BP 152/75 | HR 86

## 2023-08-27 DIAGNOSIS — M5416 Radiculopathy, lumbar region: Secondary | ICD-10-CM

## 2023-08-27 MED ORDER — METHYLPREDNISOLONE ACETATE 40 MG/ML IJ SUSP
40.0000 mg | Freq: Once | INTRAMUSCULAR | Status: AC
Start: 2023-08-27 — End: 2023-08-27
  Administered 2023-08-27: 40 mg

## 2023-08-27 NOTE — Patient Instructions (Signed)

## 2023-08-27 NOTE — Progress Notes (Unsigned)
 Pain Scale   Average Pain 5 Patient advises she has lower back pain that radiates bilaterally to legs. Patient advising her pain is constant and at times she does get relief when sitting.        +Driver, -BT, -Dye Allergies.

## 2023-08-28 NOTE — Procedures (Signed)
 Lumbar Epidural Steroid Injection - Interlaminar Approach with Fluoroscopic Guidance  [name]      Date of Birth: [birth year] MRN: [id number] PCP: Verdia Lombard, MD      Visit Date: 08/27/2023   Universal Protocol:     Consent Given By: the patient  Position: PRONE  Additional Comments: Vital signs were monitored before and after the procedure. Patient was prepped and draped in the usual sterile fashion. The correct patient, procedure, and site was verified.   Injection Procedure Details:   Procedure diagnoses: Lumbar radiculopathy [M54.16]   Meds Administered:  Meds ordered this encounter  Medications   methylPREDNISolone  acetate (DEPO-MEDROL ) injection 40 mg     Laterality: Left  Location/Site:  L5-S1  Needle: 3.5 in., 20 ga. Tuohy  Needle Placement: Paramedian epidural  Findings:   -Comments: Excellent flow of contrast into the epidural space.  Procedure Details: Using a paramedian approach from the side mentioned above, the region overlying the inferior lamina was localized under fluoroscopic visualization and the soft tissues overlying this structure were infiltrated with 4 ml. of 1% Lidocaine  without Epinephrine . The Tuohy needle was inserted into the epidural space using a paramedian approach.   The epidural space was localized using loss of resistance along with counter oblique bi-planar fluoroscopic views.  After negative aspirate for air, blood, and CSF, a 2 ml. volume of Isovue -250 was injected into the epidural space and the flow of contrast was observed. Radiographs were obtained for documentation purposes.    The injectate was administered into the level noted above.   Additional Comments:  The patient tolerated the procedure well Dressing: 2 x 2 sterile gauze and Band-Aid    Post-procedure details: Patient was observed during the procedure. Post-procedure instructions were reviewed.  Patient left the clinic in stable  condition.

## 2023-08-28 NOTE — Progress Notes (Signed)
 Megan [name] - 89 y.o. female MRN [id number]  Date of birth: Jun 01, 1934  Office Visit Note: Visit Date: 08/27/2023 PCP: Verdia Lombard, MD Referred by: Verdia Lombard, MD  Subjective: Chief Complaint  Patient presents with   Lower Back - Pain   HPI:  Megan [name] is a 89 y.o. female who comes in today at the request of Duwaine Pouch, FNP for planned Left L5-S1 Lumbar Interlaminar epidural steroid injection with fluoroscopic guidance.  The patient has failed conservative care including home exercise, medications, time and activity modification.  This injection will be diagnostic and hopefully therapeutic.  Please see requesting physician notes for further details and justification.   ROS Otherwise per HPI.  Assessment & Plan: Visit Diagnoses:    ICD-10-CM   1. Lumbar radiculopathy  M54.16 XR C-ARM NO REPORT    Epidural Steroid injection    methylPREDNISolone  acetate (DEPO-MEDROL ) injection 40 mg      Plan: No additional findings.   Meds & Orders:  Meds ordered this encounter  Medications   methylPREDNISolone  acetate (DEPO-MEDROL ) injection 40 mg    Orders Placed This Encounter  Procedures   XR C-ARM NO REPORT   Epidural Steroid injection    Follow-up: Return if symptoms worsen or fail to improve.   Procedures: No procedures performed  Lumbar Epidural Steroid Injection - Interlaminar Approach with Fluoroscopic Guidance  [name]      Date of Birth: 05-01-1934 MRN: [id number] PCP: Verdia Lombard, MD      Visit Date: 08/27/2023   Universal Protocol:     Consent Given By: the patient  Position: PRONE  Additional Comments: Vital signs were monitored before and after the procedure. Patient was prepped and draped in the usual sterile fashion. The correct patient, procedure, and site was verified.   Injection Procedure Details:   Procedure diagnoses: Lumbar radiculopathy [M54.16]   Meds Administered:  Meds ordered this encounter   Medications   methylPREDNISolone  acetate (DEPO-MEDROL ) injection 40 mg     Laterality: Left  Location/Site:  L5-S1  Needle: 3.5 in., 20 ga. Tuohy  Needle Placement: Paramedian epidural  Findings:   -Comments: Excellent flow of contrast into the epidural space.  Procedure Details: Using a paramedian approach from the side mentioned above, the region overlying the inferior lamina was localized under fluoroscopic visualization and the soft tissues overlying this structure were infiltrated with 4 ml. of 1% Lidocaine  without Epinephrine . The Tuohy needle was inserted into the epidural space using a paramedian approach.   The epidural space was localized using loss of resistance along with counter oblique bi-planar fluoroscopic views.  After negative aspirate for air, blood, and CSF, a 2 ml. volume of Isovue -250 was injected into the epidural space and the flow of contrast was observed. Radiographs were obtained for documentation purposes.    The injectate was administered into the level noted above.   Additional Comments:  The patient tolerated the procedure well Dressing: 2 x 2 sterile gauze and Band-Aid    Post-procedure details: Patient was observed during the procedure. Post-procedure instructions were reviewed.  Patient left the clinic in stable condition.   Clinical History: CLINICAL DATA:  Low back pain. Symptoms persist with greater than 6 weeks of treatment. Low back and bilateral leg pain.   EXAM: MRI LUMBAR SPINE WITHOUT CONTRAST   TECHNIQUE: Multiplanar, multisequence MR imaging of the lumbar spine was performed. No intravenous contrast was administered.   COMPARISON:  09/30/2020   FINDINGS: Segmentation: Five lumbar type vertebral bodies  as numbered previously.   Alignment: 6-7 mm degenerative anterolisthesis at the L4-5 level. 3-4 mm degenerative anterolisthesis at the L5-S1 level.   Vertebrae: Mild superior endplate depression at L4 to the right  of midline consistent with a minor compression injury. No other focal bone finding.   Conus medullaris and cauda equina: Conus extends to the L1-2 level. Conus and cauda equina appear normal.   Paraspinal and other soft tissues: Negative   Disc levels:   No significant finding from T11-12 through L2-3: Mild noncompressive disc bulges.   L3-4: Severe multifactorial spinal stenosis. Shallow protrusion of the disc. Pronounced facet and ligamentous hypertrophy. Similar appearance or slight worsening compared to the study of 2022.   L4-5: Bilateral facet arthropathy with 6-7 mm of degenerative anterolisthesis. Bulging of the disc. Severe multifactorial spinal stenosis, similar to the study of 2022.   L5-S1: Bilateral facet degeneration with 3-4 mm of degenerative anterolisthesis. Mild bulging of the disc. No central canal stenosis. Stenosis of the subarticular lateral recesses could plates the S1 nerves at risk. The L5 nerves appear to exit the foramen without compression. Similar to the study of 2022.   IMPRESSION: 1. Severe multifactorial spinal stenosis at L3-4 and L4-5, similar to the study of 2022. 2. Mild superior endplate fracture at L4 to the right of midline consistent with a minor compression injury. This could certainly relate to low back pain. 3. L5-S1 facet arthropathy with 3-4 mm of anterolisthesis. Mild bulging of the disc. Narrowing of the subarticular lateral recesses that could possibly affect the S1 nerves. Similar to the study of 2022.     Electronically Signed   By: Oneil Officer M.D.   On: 08/19/2022 14:14     Objective:  VS:  HT:    WT:   BMI:     BP:(!) 152/75  HR:86bpm  TEMP: ( )  RESP:  Physical Exam Vitals and nursing note reviewed.  Constitutional:      General: She is not in acute distress.    Appearance: Normal appearance. She is not ill-appearing.  HENT:     Head: Normocephalic and atraumatic.     Right Ear: External ear normal.      Left Ear: External ear normal.  Eyes:     Extraocular Movements: Extraocular movements intact.  Cardiovascular:     Rate and Rhythm: Normal rate.     Pulses: Normal pulses.  Pulmonary:     Effort: Pulmonary effort is normal. No respiratory distress.  Abdominal:     General: There is no distension.     Palpations: Abdomen is soft.  Musculoskeletal:        General: Tenderness present.     Cervical back: Neck supple.     Right lower leg: No edema.     Left lower leg: No edema.     Comments: Patient has good distal strength with no pain over the greater trochanters.  No clonus or focal weakness.  Skin:    Findings: No erythema, lesion or rash.  Neurological:     General: No focal deficit present.     Mental Status: She is alert and oriented to person, place, and time.     Sensory: No sensory deficit.     Motor: No weakness or abnormal muscle tone.     Coordination: Coordination normal.  Psychiatric:        Mood and Affect: Mood normal.        Behavior: Behavior normal.      Imaging: No  results found.

## 2023-10-22 DIAGNOSIS — H35363 Drusen (degenerative) of macula, bilateral: Secondary | ICD-10-CM | POA: Diagnosis not present

## 2023-10-22 DIAGNOSIS — H47011 Ischemic optic neuropathy, right eye: Secondary | ICD-10-CM | POA: Diagnosis not present

## 2023-10-22 DIAGNOSIS — H35373 Puckering of macula, bilateral: Secondary | ICD-10-CM | POA: Diagnosis not present

## 2024-02-18 ENCOUNTER — Other Ambulatory Visit: Payer: Self-pay | Admitting: Physical Medicine and Rehabilitation

## 2024-02-25 ENCOUNTER — Ambulatory Visit: Admitting: Orthopedic Surgery

## 2024-03-02 ENCOUNTER — Other Ambulatory Visit: Payer: Self-pay | Admitting: Internal Medicine

## 2024-03-02 DIAGNOSIS — Z1231 Encounter for screening mammogram for malignant neoplasm of breast: Secondary | ICD-10-CM

## 2024-03-08 ENCOUNTER — Ambulatory Visit: Admitting: Orthopedic Surgery

## 2024-03-11 ENCOUNTER — Ambulatory Visit
